# Patient Record
Sex: Female | Born: 1990 | Race: White | Hispanic: No | Smoking: Current every day smoker
Health system: Southern US, Community
[De-identification: ages and names within clinical notes are randomized; demographics above are authoritative.]

## PROBLEM LIST (undated history)

## (undated) ENCOUNTER — Inpatient Hospital Stay (HOSPITAL_COMMUNITY): Payer: Self-pay

## (undated) DIAGNOSIS — K311 Adult hypertrophic pyloric stenosis: Secondary | ICD-10-CM

## (undated) DIAGNOSIS — O24419 Gestational diabetes mellitus in pregnancy, unspecified control: Secondary | ICD-10-CM

## (undated) DIAGNOSIS — L509 Urticaria, unspecified: Secondary | ICD-10-CM

## (undated) DIAGNOSIS — E079 Disorder of thyroid, unspecified: Secondary | ICD-10-CM

## (undated) HISTORY — PX: PYLOROMYOTOMY: SHX5274

## (undated) HISTORY — DX: Gestational diabetes mellitus in pregnancy, unspecified control: O24.419

## (undated) HISTORY — DX: Disorder of thyroid, unspecified: E07.9

## (undated) HISTORY — DX: Urticaria, unspecified: L50.9

## (undated) NOTE — *Deleted (*Deleted)
Chronic urticaria Continue Zyrtec 10 mg taking 2 tablets in the morning and 2 tablets at night Continue Pepcid 20 mg twice a day Get labs completed that were ordered at last office visit.  Seasonal and perennial allergic rhinitis Continue antihistamines as above Consider saline rinses or saline nasal spray as needed for nasal symptoms.  Use this prior to any medicated nasal sprays. Consider immunotherapy in the future if medications are not helping.  Recurrent infections Get labs completed that were ordered from last office visit  Mild persistent asthma Continue Flovent 110 mcg using 1 puff twice a day with spacer to help prevent cough and wheeze May use albuterol 2 puffs every 4 hours as needed for cough, wheeze, tightness in chest, or shortness of breath.  Also may use albuterol 2 puffs 5 to 15 minutes prior to exercise to help prevent cough or wheeze For asthma flares increase Flovent to 2 puffs twice a day with spacer for 2 weeks.  Please let us know if this treatment is not working well for you Schedule a follow-up appointment in

---
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
## 2012-01-18 ENCOUNTER — Encounter (HOSPITAL_COMMUNITY): Payer: Self-pay | Admitting: Emergency Medicine

## 2012-01-18 ENCOUNTER — Emergency Department (INDEPENDENT_AMBULATORY_CARE_PROVIDER_SITE_OTHER)
Admission: EM | Admit: 2012-01-18 | Discharge: 2012-01-18 | Disposition: A | Discharge status: Home / Self Care | Payer: Medicaid Other | Source: Home / Self Care | Attending: Family Medicine | Admitting: Family Medicine

## 2012-01-18 DIAGNOSIS — N949 Unspecified condition associated with female genital organs and menstrual cycle: Secondary | ICD-10-CM

## 2012-01-18 HISTORY — DX: Adult hypertrophic pyloric stenosis: K31.1

## 2012-01-18 LAB — POCT URINALYSIS DIP (DEVICE)
Glucose, UA: NEGATIVE mg/dL
Nitrite: NEGATIVE
Protein, ur: NEGATIVE mg/dL
Urobilinogen, UA: 0.2 mg/dL (ref 0.0–1.0)

## 2012-01-18 LAB — POCT PREGNANCY, URINE: Preg Test, Ur: POSITIVE

## 2012-01-18 NOTE — ED Notes (Signed)
Reports one week ago, lower abdominal cramping.  Last night noticed dizziness.  Also c/o headache. Denies nausea, denies vomiting, denies diarrhea.  Denies vaginal discharge, denies burning with urination

## 2012-01-18 NOTE — ED Provider Notes (Signed)
History     CSN: [id number]  Arrival date & time 01/18/12  1303   First MD Initiated Contact with Patient 01/18/12 1530      Chief Complaint  Patient presents with  . Abdominal Cramping    (Consider location/radiation/quality/duration/timing/severity/associated sxs/prior treatment) HPI Comments: Peggy [name] presents for evaluation of one week of lower bilateral abdominal cramping. She is currently [redacted] weeks pregnant and denies any vaginal bleeding or discharge. This is her first pregnancy. She is followed by Dr. Clearance Coots [facility]. She had a normal ultrasound at 8.5 weeks. She denies any urinary symptoms.   Patient is a 21 y.o. female presenting with cramps. The history is provided by the patient.  Abdominal Cramping The primary symptoms of the illness include abdominal pain. The primary symptoms of the illness do not include vaginal discharge or vaginal bleeding. The current episode started more than 2 days ago. The onset of the illness was sudden. The problem has not changed since onset. The patient states that she believes she is currently pregnant. The patient has not had a change in bowel habit.    Past Medical History  Diagnosis Date  . Asthma   . Pyloric stenosis     Past Surgical History  Procedure Date  . Pyloromyotomy     No family history on file.  History  Substance Use Topics  . Smoking status: Never Smoker   . Smokeless tobacco: Not on file  . Alcohol Use: No    OB History    Grav Para Term Preterm Abortions TAB SAB Ect Mult Living   1               Review of Systems  Constitutional: Negative.   HENT: Negative.   Eyes: Negative.   Respiratory: Negative.   Cardiovascular: Negative.   Gastrointestinal: Positive for abdominal pain.  Genitourinary: Negative.  Negative for vaginal bleeding, vaginal discharge and vaginal pain.  Musculoskeletal: Negative.   Skin: Negative.   Neurological: Negative.     Allergies  Review of patient's allergies  indicates no known allergies.  Home Medications   Current Outpatient Rx  Name Route Sig Dispense Refill  . ALBUTEROL SULFATE HFA 108 (90 BASE) MCG/ACT IN AERS Inhalation Inhale 2 puffs into the lungs every 6 (six) hours as needed. Shortness of breath       BP 128/82  Pulse 96  Temp(Src) 98.8 F (37.1 C) (Oral)  Resp 18  SpO2 100%  LMP 09/18/2011  Physical Exam  Nursing note and vitals reviewed. Constitutional: She is oriented to person, place, and time. She appears well-developed and well-nourished.  HENT:  Head: Normocephalic and atraumatic.  Eyes: EOM are normal.  Neck: Normal range of motion.  Pulmonary/Chest: Effort normal.  Abdominal: Soft. Bowel sounds are normal. There is no tenderness.       Gravid abdomen; patient reports bilateral cramping RLQ and LLQ, not reproducible  Musculoskeletal: Normal range of motion.  Neurological: She is alert and oriented to person, place, and time.  Skin: Skin is warm and dry.  Psychiatric: Her behavior is normal.    ED Course  Procedures (including critical care time)  Labs Reviewed  POCT URINALYSIS DIP (DEVICE) - Abnormal; Notable for the following:    Leukocytes, UA SMALL (*) Biochemical Testing Only. Please order routine urinalysis from main lab if confirmatory testing is needed.   All other components within normal limits  POCT PREGNANCY, URINE  POCT URINALYSIS DIPSTICK  POCT PREGNANCY, URINE   No results found.  1. Round ligament pain       MDM  Advised acetaminophen and warm baths; FU with prenatal provider        Richardo Priest, MD 01/18/12 1617

## 2012-03-07 ENCOUNTER — Inpatient Hospital Stay (HOSPITAL_COMMUNITY)
Admission: AD | Admit: 2012-03-07 | Discharge: 2012-03-07 | Disposition: A | Discharge status: Home / Self Care | Payer: Medicaid Other | Source: Ambulatory Visit | Attending: Obstetrics & Gynecology | Admitting: Obstetrics & Gynecology

## 2012-03-07 ENCOUNTER — Encounter (HOSPITAL_COMMUNITY): Payer: Self-pay

## 2012-03-07 DIAGNOSIS — O368190 Decreased fetal movements, unspecified trimester, not applicable or unspecified: Secondary | ICD-10-CM | POA: Insufficient documentation

## 2012-03-07 NOTE — ED Provider Notes (Signed)
History     CSN: [id number]  Arrival date & time 03/07/12  1449   None     Chief Complaint  Patient presents with  . Decreased Fetal Movement   HPI Peggy [name] is a 21 y.o. female @ [redacted]w[redacted]d gestation who presents to [facility]U stating she has not felt fetal movement in 2 days. Normal recent anatomy scan except for spine not seen.   Past Medical History  Diagnosis Date  . Asthma   . Pyloric stenosis     Past Surgical History  Procedure Date  . Pyloromyotomy     No family history on file.  History  Substance Use Topics  . Smoking status: Never Smoker   . Smokeless tobacco: Not on file  . Alcohol Use: No    OB History    Grav Para Term Preterm Abortions TAB SAB Ect Mult Living   1               Review of Systems    Allergies  Review of patient's allergies indicates no known allergies.  Home Medications  No current outpatient prescriptions on file.  BP 112/73  Pulse 105  Temp(Src) 96.5 F (35.8 C) (Oral)  Resp 16  Ht 5' (1.524 m)  Wt 134 lb 3.2 oz (60.873 kg)  BMI 26.21 kg/m2  LMP 09/18/2011  Physical Exam  Nursing note and vitals reviewed. Constitutional: She is oriented to person, place, and time. She appears well-developed and well-nourished.  HENT:  Head: Normocephalic.  Eyes: EOM are normal.  Neck: Neck supple.  Pulmonary/Chest: Effort normal.  Abdominal: Soft. There is no tenderness.       + FHT  Musculoskeletal: Normal range of motion.  Neurological: She is alert and oriented to person, place, and time. No cranial nerve deficit.  Skin: Skin is warm and dry.  Psychiatric: She has a normal mood and affect. Her behavior is normal. Judgment and thought content normal.    ED Course: Care turned over to [place], CNM  Procedures  EFM: 150's, minimal variability, no accels or decels. Reassuring for gestation. FM heard on NST No UC's palpated  MDM  Decreased FM at 22.[redacted] weeks gestation w/ reassuring FHR tracing  D/C home F/U in Dr.  Verdell Carmine office as scheduled or MAU PRN Normal expectations for FM discussed  Dorathy Kinsman 03/07/2012 4:48 PM  Janne Napoleon, NP 03/07/12 1634

## 2012-03-07 NOTE — Progress Notes (Signed)
Baby has not moved for 2 days.

## 2012-03-09 ENCOUNTER — Other Ambulatory Visit: Payer: Self-pay | Admitting: Obstetrics

## 2012-03-09 DIAGNOSIS — Z0489 Encounter for examination and observation for other specified reasons: Secondary | ICD-10-CM

## 2012-03-09 DIAGNOSIS — IMO0002 Reserved for concepts with insufficient information to code with codable children: Secondary | ICD-10-CM

## 2012-03-11 ENCOUNTER — Other Ambulatory Visit: Payer: Self-pay | Admitting: Obstetrics

## 2012-03-11 ENCOUNTER — Ambulatory Visit (HOSPITAL_COMMUNITY)
Admission: RE | Admit: 2012-03-11 | Discharge: 2012-03-11 | Disposition: A | Discharge status: Home / Self Care | Payer: Medicaid Other | Source: Ambulatory Visit | Attending: Obstetrics | Admitting: Obstetrics

## 2012-03-11 DIAGNOSIS — O99330 Smoking (tobacco) complicating pregnancy, unspecified trimester: Secondary | ICD-10-CM | POA: Insufficient documentation

## 2012-03-11 DIAGNOSIS — Z1389 Encounter for screening for other disorder: Secondary | ICD-10-CM | POA: Insufficient documentation

## 2012-03-11 DIAGNOSIS — Z0489 Encounter for examination and observation for other specified reasons: Secondary | ICD-10-CM

## 2012-03-11 DIAGNOSIS — O358XX0 Maternal care for other (suspected) fetal abnormality and damage, not applicable or unspecified: Secondary | ICD-10-CM | POA: Insufficient documentation

## 2012-03-11 DIAGNOSIS — Z363 Encounter for antenatal screening for malformations: Secondary | ICD-10-CM | POA: Insufficient documentation

## 2012-03-11 DIAGNOSIS — IMO0002 Reserved for concepts with insufficient information to code with codable children: Secondary | ICD-10-CM

## 2012-04-08 ENCOUNTER — Encounter (HOSPITAL_COMMUNITY): Payer: Self-pay | Admitting: *Deleted

## 2012-04-08 ENCOUNTER — Inpatient Hospital Stay (HOSPITAL_COMMUNITY)
Admission: AD | Admit: 2012-04-08 | Discharge: 2012-04-08 | Disposition: A | Discharge status: Home / Self Care | Payer: Medicaid Other | Source: Ambulatory Visit | Attending: Obstetrics & Gynecology | Admitting: Obstetrics & Gynecology

## 2012-04-08 DIAGNOSIS — O368190 Decreased fetal movements, unspecified trimester, not applicable or unspecified: Secondary | ICD-10-CM

## 2012-04-08 DIAGNOSIS — R109 Unspecified abdominal pain: Secondary | ICD-10-CM | POA: Insufficient documentation

## 2012-04-08 LAB — URINALYSIS, ROUTINE W REFLEX MICROSCOPIC
Bilirubin Urine: NEGATIVE
Glucose, UA: NEGATIVE mg/dL
Ketones, ur: NEGATIVE mg/dL
Leukocytes, UA: NEGATIVE
Nitrite: NEGATIVE
Protein, ur: NEGATIVE mg/dL
pH: 6 (ref 5.0–8.0)

## 2012-04-08 MED ORDER — POLYETHYLENE GLYCOL 3350 17 GM/SCOOP PO POWD: 17.0000 g | Freq: Every day | ORAL | Status: AC

## 2012-04-08 NOTE — MAU Provider Note (Signed)
  History     CSN: [id number]  Arrival date and time: 04/08/12 4098   First Provider Initiated Contact with Patient 04/08/12 2133      Chief Complaint  Patient presents with  . Abdominal Pain  . Decreased Fetal Movement   HPI Per RN:   Pt presents with complaint that she has not felt any fetal movement since yesterday pm.(FHT's 150 right now). Reports pain left upper quadrant off/on for 2 weeks. Denies dysuria, denies bleeding or ROM. G1      Decreased FM and intermittent LUQ pain, last this morning. Long history of constipation.Cannot remember when she went last OB History    Grav Para Term Preterm Abortions TAB SAB Ect Mult Living   1               Past Medical History  Diagnosis Date  . Asthma   . Pyloric stenosis   . No pertinent past medical history     Past Surgical History  Procedure Date  . Pyloromyotomy   . No past surgeries     Family History  Problem Relation Age of Onset  . Diabetes Mother   . Hyperlipidemia Father     History  Substance Use Topics  . Smoking status: Never Smoker   . Smokeless tobacco: Not on file  . Alcohol Use: No    Allergies: No Known Allergies  Prescriptions prior to admission  Medication Sig Dispense Refill  . Prenatal Vit-Fe Fumarate-FA (PRENATAL MULTIVITAMIN) TABS Take 1 tablet by mouth daily.      Marland Kitchen albuterol (PROVENTIL HFA;VENTOLIN HFA) 108 (90 BASE) MCG/ACT inhaler Inhale 2 puffs into the lungs every 6 (six) hours as needed. Shortness of breath         Review of Systems  Constitutional: Negative for fever and chills.  Gastrointestinal: Positive for abdominal pain (intermittent, LUQ) and constipation. Negative for nausea, vomiting and diarrhea.  Genitourinary: Negative for dysuria.    Physical Exam   Blood pressure 139/74, pulse 103, temperature 100 F (37.8 C), temperature source Oral, resp. rate 16, height 5' (1.524 m), weight 140 lb (63.504 kg), last menstrual period 09/18/2011, SpO2 99.00%.  Physical  Exam  Constitutional: She is oriented to person, place, and time. She appears well-developed and well-nourished. No distress.  HENT:  Head: Normocephalic.  Cardiovascular: Normal rate.   Respiratory: Effort normal.  GI: Soft. She exhibits no distension. There is no tenderness. There is no rebound and no guarding.  Genitourinary: Vagina normal and uterus normal. No vaginal discharge found.  Musculoskeletal: Normal range of motion.  Neurological: She is alert and oriented to person, place, and time.  Skin: Skin is warm and dry.  Psychiatric: She has a normal mood and affect.   EFM:  FHR reassuring           No contractions  Dilation: Closed Effacement (%): Thick Cervical Position: Posterior Station: Ballotable Exam by:: Artelia Laroche CNM  MAU Course  Procedures   Assessment and Plan  A:  SIUP at [redacted]w[redacted]d      Audible fetal movement, but pt cannot feel it      Reassuring FHR P:  Discharge home      Rx Miralax and recommend fiber and water      Followup in office   [facility] 04/08/2012, 9:41 PM

## 2012-04-08 NOTE — Discharge Instructions (Signed)
Fetal Movement Counts Patient Name: __________________________________________________ Patient Due Date: ____________________ Kick counts is highly recommended in high risk pregnancies, but it is a good idea for every pregnant woman to do. Start counting fetal movements at 28 weeks of the pregnancy. Fetal movements increase after eating a full meal or eating or drinking something sweet (the blood sugar is higher). It is also important to drink plenty of fluids (well hydrated) before doing the count. Lie on your left side because it helps with the circulation or you can sit in a comfortable chair with your arms over your belly (abdomen) with no distractions around you. DOING THE COUNT  Try to do the count the same time of day each time you do it.   Mark the day and time, then see how long it takes for you to feel 10 movements (kicks, flutters, swishes, rolls). You should have at least 10 movements within 2 hours. You will most likely feel 10 movements in much less than 2 hours. If you do not, wait an hour and count again. After a couple of days you will see a pattern.   What you are looking for is a change in the pattern or not enough counts in 2 hours. Is it taking longer in time to reach 10 movements?  SEEK MEDICAL CARE IF:  You feel less than 10 counts in 2 hours. Tried twice.   No movement in one hour.   The pattern is changing or taking longer each day to reach 10 counts in 2 hours.   You feel the baby is not moving as it usually does.  Date: ____________ Movements: ____________ Start time: ____________ Finish time: ____________  Date: ____________ Movements: ____________ Start time: ____________ Finish time: ____________ Date: ____________ Movements: ____________ Start time: ____________ Finish time: ____________ Date: ____________ Movements: ____________ Start time: ____________ Finish time: ____________ Date: ____________ Movements: ____________ Start time: ____________ Finish time:  ____________ Date: ____________ Movements: ____________ Start time: ____________ Finish time: ____________ Date: ____________ Movements: ____________ Start time: ____________ Finish time: ____________ Date: ____________ Movements: ____________ Start time: ____________ Finish time: ____________  Date: ____________ Movements: ____________ Start time: ____________ Finish time: ____________ Date: ____________ Movements: ____________ Start time: ____________ Finish time: ____________ Date: ____________ Movements: ____________ Start time: ____________ Finish time: ____________ Date: ____________ Movements: ____________ Start time: ____________ Finish time: ____________ Date: ____________ Movements: ____________ Start time: ____________ Finish time: ____________ Date: ____________ Movements: ____________ Start time: ____________ Finish time: ____________ Date: ____________ Movements: ____________ Start time: ____________ Finish time: ____________  Date: ____________ Movements: ____________ Start time: ____________ Finish time: ____________ Date: ____________ Movements: ____________ Start time: ____________ Finish time: ____________ Date: ____________ Movements: ____________ Start time: ____________ Finish time: ____________ Date: ____________ Movements: ____________ Start time: ____________ Finish time: ____________ Date: ____________ Movements: ____________ Start time: ____________ Finish time: ____________ Date: ____________ Movements: ____________ Start time: ____________ Finish time: ____________ Date: ____________ Movements: ____________ Start time: ____________ Finish time: ____________  Date: ____________ Movements: ____________ Start time: ____________ Finish time: ____________ Date: ____________ Movements: ____________ Start time: ____________ Finish time: ____________ Date: ____________ Movements: ____________ Start time: ____________ Finish time: ____________ Date: ____________ Movements:  ____________ Start time: ____________ Finish time: ____________ Date: ____________ Movements: ____________ Start time: ____________ Finish time: ____________ Date: ____________ Movements: ____________ Start time: ____________ Finish time: ____________ Date: ____________ Movements: ____________ Start time: ____________ Finish time: ____________  Date: ____________ Movements: ____________ Start time: ____________ Finish time: ____________ Date: ____________ Movements: ____________ Start time: ____________ Finish time: ____________ Date: ____________ Movements: ____________ Start time:   ____________ Doreatha Martin time: ____________ Date: ____________ Movements: ____________ Start time: ____________ Doreatha Martin time: ____________ Date: ____________ Movements: ____________ Start time: ____________ Doreatha Martin time: ____________ Date: ____________ Movements: ____________ Start time: ____________ Doreatha Martin time: ____________ Date: ____________ Movements: ____________ Start time: ____________ Doreatha Martin time: ____________  Date: ____________ Movements: ____________ Start time: ____________ Doreatha Martin time: ____________ Date: ____________ Movements: ____________ Start time: ____________ Doreatha Martin time: ____________ Date: ____________ Movements: ____________ Start time: ____________ Doreatha Martin time: ____________ Date: ____________ Movements: ____________ Start time: ____________ Doreatha Martin time: ____________ Date: ____________ Movements: ____________ Start time: ____________ Doreatha Martin time: ____________ Date: ____________ Movements: ____________ Start time: ____________ Doreatha Martin time: ____________ Date: ____________ Movements: ____________ Start time: ____________ Doreatha Martin time: ____________  Date: ____________ Movements: ____________ Start time: ____________ Doreatha Martin time: ____________ Date: ____________ Movements: ____________ Start time: ____________ Doreatha Martin time: ____________ Date: ____________ Movements: ____________ Start time: ____________ Doreatha Martin  time: ____________ Date: ____________ Movements: ____________ Start time: ____________ Doreatha Martin time: ____________ Date: ____________ Movements: ____________ Start time: ____________ Doreatha Martin time: ____________ Date: ____________ Movements: ____________ Start time: ____________ Doreatha Martin time: ____________ Date: ____________ Movements: ____________ Start time: ____________ Doreatha Martin time: ____________  Date: ____________ Movements: ____________ Start time: ____________ Doreatha Martin time: ____________ Date: ____________ Movements: ____________ Start time: ____________ Doreatha Martin time: ____________ Date: ____________ Movements: ____________ Start time: ____________ Doreatha Martin time: ____________ Date: ____________ Movements: ____________ Start time: ____________ Doreatha Martin time: ____________ Date: ____________ Movements: ____________ Start time: ____________ Doreatha Martin time: ____________ Date: ____________ Movements: ____________ Start time: ____________ Doreatha Martin time: ____________ Document Released: 01/15/2007 Document Revised: 12/05/2011 Document Reviewed: 07/18/2009 [employer] Patient Information 2012 [address].  Constipation in Adults Constipation is having fewer than 2 bowel movements per week. Usually, the stools are hard. As we grow older, constipation is more common. If you try to fix constipation with laxatives, the problem may get worse. This is because laxatives taken over a long period of time make the colon muscles weaker. A low-fiber diet, not taking in enough fluids, and taking some medicines may make these problems worse. MEDICATIONS THAT MAY CAUSE CONSTIPATION  Water pills (diuretics).   Calcium channel blockers (used to control blood pressure and for the heart).   Certain pain medicines (narcotics).   Anticholinergics.   Anti-inflammatory agents.   Antacids that contain aluminum.  DISEASES THAT CONTRIBUTE TO CONSTIPATION  Diabetes.   Parkinson's disease.   Dementia.   Stroke.   Depression.     Illnesses that cause problems with salt and water metabolism.  HOME CARE INSTRUCTIONS   Constipation is usually best cared for without medicines. Increasing dietary fiber and eating more fruits and vegetables is the best way to manage constipation.   Slowly increase fiber intake to 25 to 38 grams per day. Whole grains, fruits, vegetables, and legumes are good sources of fiber. A dietitian can further help you incorporate high-fiber foods into your diet.   Drink enough water and fluids to keep your urine clear or pale yellow.   A fiber supplement may be added to your diet if you cannot get enough fiber from foods.   Increasing your activities also helps improve regularity.   Suppositories, as suggested by your caregiver, will also help. If you are using antacids, such as aluminum or calcium containing products, it will be helpful to switch to products containing magnesium if your caregiver says it is okay.   If you have been given a liquid injection (enema) today, this is only a temporary measure. It should not be relied on for treatment of longstanding (chronic) constipation.   Stronger measures, such as magnesium sulfate, should  be avoided if possible. This may cause uncontrollable diarrhea. Using magnesium sulfate may not allow you time to make it to the bathroom.  SEEK IMMEDIATE MEDICAL CARE IF:   There is bright red blood in the stool.   The constipation stays for more than 4 days.   There is belly (abdominal) or rectal pain.   You do not seem to be getting better.   You have any questions or concerns.  MAKE SURE YOU:   Understand these instructions.   Will watch your condition.   Will get help right away if you are not doing well or get worse.  Document Released: 09/13/2004 Document Revised: 12/05/2011 Document Reviewed: 11/19/2011 [facility] Patient Information 2012 [place], [place].

## 2012-04-08 NOTE — MAU Note (Signed)
Pt presents with complaint that she has not felt any fetal movement since yesterday pm.(FHT's 150 right now). Reports pain left upper quadrant off/on for 2 weeks. Denies dysuria, denies bleeding or ROM. G1

## 2012-05-26 ENCOUNTER — Inpatient Hospital Stay (HOSPITAL_COMMUNITY)
Admission: AD | Admit: 2012-05-26 | Discharge: 2012-05-26 | Disposition: A | Discharge status: Home / Self Care | Payer: Medicaid Other | Source: Ambulatory Visit | Attending: Obstetrics | Admitting: Obstetrics

## 2012-05-26 ENCOUNTER — Encounter (HOSPITAL_COMMUNITY): Payer: Self-pay | Admitting: *Deleted

## 2012-05-26 DIAGNOSIS — B9689 Other specified bacterial agents as the cause of diseases classified elsewhere: Secondary | ICD-10-CM

## 2012-05-26 DIAGNOSIS — R109 Unspecified abdominal pain: Secondary | ICD-10-CM | POA: Insufficient documentation

## 2012-05-26 DIAGNOSIS — O4700 False labor before 37 completed weeks of gestation, unspecified trimester: Secondary | ICD-10-CM

## 2012-05-26 DIAGNOSIS — O479 False labor, unspecified: Secondary | ICD-10-CM

## 2012-05-26 DIAGNOSIS — A499 Bacterial infection, unspecified: Secondary | ICD-10-CM | POA: Insufficient documentation

## 2012-05-26 DIAGNOSIS — N760 Acute vaginitis: Secondary | ICD-10-CM | POA: Insufficient documentation

## 2012-05-26 DIAGNOSIS — O2390 Unspecified genitourinary tract infection in pregnancy, unspecified trimester: Secondary | ICD-10-CM | POA: Insufficient documentation

## 2012-05-26 LAB — URINALYSIS, ROUTINE W REFLEX MICROSCOPIC
Bilirubin Urine: NEGATIVE
Ketones, ur: NEGATIVE mg/dL
Nitrite: NEGATIVE
Specific Gravity, Urine: 1.01 (ref 1.005–1.030)
pH: 7.5 (ref 5.0–8.0)

## 2012-05-26 LAB — URINE MICROSCOPIC-ADD ON

## 2012-05-26 LAB — WET PREP, GENITAL: Yeast Wet Prep HPF POC: NONE SEEN

## 2012-05-26 MED ORDER — METRONIDAZOLE 500 MG PO TABS: 500.0000 mg | ORAL_TABLET | Freq: Two times a day (BID) | ORAL | Status: DC

## 2012-05-26 NOTE — Discharge Instructions (Signed)
Bacterial Vaginosis Bacterial vaginosis is an infection of the vagina. A healthy vagina has many kinds of good germs (bacteria). Sometimes the number of good germs can change. This allows bad germs to move in and cause an infection. You may be given medicine (antibiotics) to treat the infection. Or, you may not need treatment at all. HOME CARE  Take your medicine as told. Finish them even if you start to feel better.   Do not have sex until you finish your medicine.   Do not douche.   Practice safe sex.   Tell your sex partner that you have an infection. They should see their doctor for treatment if they have problems.  GET HELP RIGHT AWAY IF:  You do not get better after 3 days of treatment.   You have grey fluid (discharge) coming from your vagina.   You have pain.   You have a temperature of 102 F (38.9 C) or higher.  MAKE SURE YOU:   Understand these instructions.   Will watch your condition.   Will get help right away if you are not doing well or get worse.  Document Released: 09/24/2008 Document Revised: 12/05/2011 Document Reviewed: 09/24/2008 [employer] Patient Information 2012 [employer]. 

## 2012-05-26 NOTE — MAU Note (Signed)
PT SAYS SHE HAS BEEN HAVING CRAMPS X1 WEEK.  WENT TO DR HARPER LAST Monday- TOLD HIM OF CRAMPING.  Monday NIGHT- CRAMPING BECAME WORSE LASTED 2 HRS THEN STOPPED. .  NOW IN TRIAGE  - LESS CRAMPING.   WHEN  SHE WIPES - SHE HAD PINK D/C ON  PAPER.- THAT HAPPENED  AT 7 PM..   LAST SEX-  3 WEEKS AGO.

## 2012-05-26 NOTE — MAU Provider Note (Signed)
Chief Complaint:  No chief complaint on file.   First Provider Initiated Contact with Patient 05/26/12 2109      HPI  Peggy [name] is a 21 y.o. G1P0 at [redacted]w[redacted]d presenting with one week history of tightening and cramping felt in the lower abdomen. Today this occurred about 10 times and she is not experiencing abdominal pain now. She had some pinkish fluid on toilet paper after urinating a few hours ago but this has not recurred. Denies leakage of fluid.  Good fetal movement. Denies irritating vaginal discharge  Pregnancy Course: uncomplicated  Past Medical History: Past Medical History  Diagnosis Date  . Asthma   . Pyloric stenosis     Past Surgical History: Past Surgical History  Procedure Date  . Pyloromyotomy   . No past surgeries     Family History: Family History  Problem Relation Age of Onset  . Diabetes Mother   . Hyperlipidemia Father     Social History: History  Substance Use Topics  . Smoking status: Never Smoker   . Smokeless tobacco: Not on file  . Alcohol Use: No    Allergies: No Known Allergies  Meds:  Prescriptions prior to admission  Medication Sig Dispense Refill  . albuterol (PROVENTIL HFA;VENTOLIN HFA) 108 (90 BASE) MCG/ACT inhaler Inhale 2 puffs into the lungs every 6 (six) hours as needed. For shortness of breath      . Prenatal Vit-Fe Fumarate-FA (PRENATAL MULTIVITAMIN) TABS Take 1 tablet by mouth daily.          Physical Exam  Blood pressure 114/64, pulse 98, temperature 98.9 F (37.2 C), temperature source Oral, resp. rate 20, height 5' (1.524 m), weight 66.395 kg (146 lb 6 oz), last menstrual period 09/18/2011. GENERAL: Well-developed, well-nourished female in no acute distress.  HEENT: normocephalic, good dentition HEART: normal rate RESP: normal effort ABDOMEN: Soft, nontender, no UC palpable  PELVIC: NEFG, vagina with scant white discharge, no blood seen EXTREMITIES: Nontender, no edema NEURO: alert and  oriented  SPECULUM EXAM: Dilation: Closed Effacement (%): Thick Station: -3 Exam by:: D. Janneth Krasner CNM  FHT:  Baseline 150 , moderate variability, accelerations present, no decelerations Contractions: slight UI, not contracting   Labs: Results for orders placed during the hospital encounter of 05/26/12 (from the past 24 hour(s))  URINALYSIS, ROUTINE W REFLEX MICROSCOPIC     Status: Abnormal   Collection Time   05/26/12  8:03 PM      Component Value Range   Color, Urine YELLOW  YELLOW    APPearance CLEAR  CLEAR    Specific Gravity, Urine 1.010  1.005 - 1.030    pH 7.5  5.0 - 8.0    Glucose, UA NEGATIVE  NEGATIVE (mg/dL)   Hgb urine dipstick SMALL (*) NEGATIVE    Bilirubin Urine NEGATIVE  NEGATIVE    Ketones, ur NEGATIVE  NEGATIVE (mg/dL)   Protein, ur NEGATIVE  NEGATIVE (mg/dL)   Urobilinogen, UA 0.2  0.0 - 1.0 (mg/dL)   Nitrite NEGATIVE  NEGATIVE    Leukocytes, UA SMALL (*) NEGATIVE   URINE MICROSCOPIC-ADD ON     Status: Normal   Collection Time   05/26/12  8:03 PM      Component Value Range   Squamous Epithelial / LPF RARE  RARE    WBC, UA 0-2  <3 (WBC/hpf)   RBC / HPF 0-2  <3 (RBC/hpf)  WET PREP, GENITAL     Status: Abnormal   Collection Time   05/26/12  9:20 PM  Component Value Range   Yeast Wet Prep HPF POC NONE SEEN  NONE SEEN    Trich, Wet Prep NONE SEEN  NONE SEEN    Clue Cells Wet Prep HPF POC FEW (*) NONE SEEN    WBC, Wet Prep HPF POC FEW (*) NONE SEEN         Assessment: Fetus well with no evidence of vaginal bleeding or PTL BV    Plan: D/W Dr. Gaynell Face. Advise pelvic rest until appt with Dr. Clearance Coots next week. S/sx PTL reviewed.  Rx Flagyl   [name] 5/28/20139:19 PM

## 2012-05-27 LAB — GC/CHLAMYDIA PROBE AMP, GENITAL: Chlamydia, DNA Probe: NEGATIVE

## 2012-05-31 ENCOUNTER — Encounter (HOSPITAL_COMMUNITY): Payer: Self-pay | Admitting: *Deleted

## 2012-05-31 ENCOUNTER — Inpatient Hospital Stay (HOSPITAL_COMMUNITY)
Admission: AD | Admit: 2012-05-31 | Discharge: 2012-06-01 | Disposition: A | Discharge status: Home / Self Care | Payer: Medicaid Other | Source: Ambulatory Visit | Attending: Obstetrics | Admitting: Obstetrics

## 2012-05-31 DIAGNOSIS — O479 False labor, unspecified: Secondary | ICD-10-CM

## 2012-05-31 DIAGNOSIS — O4700 False labor before 37 completed weeks of gestation, unspecified trimester: Secondary | ICD-10-CM | POA: Insufficient documentation

## 2012-05-31 DIAGNOSIS — O99891 Other specified diseases and conditions complicating pregnancy: Secondary | ICD-10-CM | POA: Insufficient documentation

## 2012-05-31 DIAGNOSIS — R079 Chest pain, unspecified: Secondary | ICD-10-CM | POA: Insufficient documentation

## 2012-05-31 DIAGNOSIS — K219 Gastro-esophageal reflux disease without esophagitis: Secondary | ICD-10-CM

## 2012-05-31 DIAGNOSIS — L559 Sunburn, unspecified: Secondary | ICD-10-CM | POA: Insufficient documentation

## 2012-05-31 DIAGNOSIS — O99019 Anemia complicating pregnancy, unspecified trimester: Secondary | ICD-10-CM | POA: Insufficient documentation

## 2012-05-31 DIAGNOSIS — D649 Anemia, unspecified: Secondary | ICD-10-CM

## 2012-05-31 LAB — CBC
MCH: 28.6 pg (ref 26.0–34.0)
Platelets: 290 10*3/uL (ref 150–400)
RBC: 3.43 MIL/uL — ABNORMAL LOW (ref 3.87–5.11)

## 2012-05-31 LAB — COMPREHENSIVE METABOLIC PANEL
AST: 14 U/L (ref 0–37)
BUN: 6 mg/dL (ref 6–23)
CO2: 20 mEq/L (ref 19–32)
Calcium: 8.7 mg/dL (ref 8.4–10.5)
Chloride: 101 mEq/L (ref 96–112)
Creatinine, Ser: 0.43 mg/dL — ABNORMAL LOW (ref 0.50–1.10)
GFR calc Af Amer: >90 mL/min (ref >90)
GFR calc non Af Amer: >90 mL/min (ref >90)
Glucose, Bld: 99 mg/dL (ref 70–99)
Total Bilirubin: 0.2 mg/dL — ABNORMAL LOW (ref 0.3–1.2)

## 2012-05-31 LAB — URINE MICROSCOPIC-ADD ON

## 2012-05-31 LAB — URINALYSIS, ROUTINE W REFLEX MICROSCOPIC
Bilirubin Urine: NEGATIVE
Glucose, UA: NEGATIVE mg/dL
Hgb urine dipstick: NEGATIVE
Ketones, ur: NEGATIVE mg/dL
Protein, ur: NEGATIVE mg/dL
pH: 6.5 (ref 5.0–8.0)

## 2012-05-31 MED ORDER — NIFEDIPINE 10 MG PO CAPS
10.0000 mg | ORAL_CAPSULE | Freq: Once | ORAL | Status: AC
  Administered 2012-05-31: 10 mg via ORAL
  Filled 2012-05-31: qty 1

## 2012-05-31 MED ORDER — GI COCKTAIL ~~LOC~~
30.0000 mL | Freq: Once | ORAL | Status: AC
  Administered 2012-05-31: 30 mL via ORAL
  Filled 2012-05-31: qty 30

## 2012-05-31 MED ORDER — LACTATED RINGERS IV BOLUS (SEPSIS)
1000.0000 mL | Freq: Once | INTRAVENOUS | Status: AC
  Administered 2012-05-31: 1000 mL via INTRAVENOUS

## 2012-05-31 NOTE — Progress Notes (Signed)
Pt states pain is dull"for the most part"but does become sharp

## 2012-05-31 NOTE — MAU Note (Signed)
Pt reports "really , really bad chest pain since 6:30 and it hasn't went away." pain is mid sternum. Last meal was at 1700, pizza. Pain does not radiate.

## 2012-05-31 NOTE — MAU Note (Signed)
Pt states she started feeling pain in her chest around 1830 tonight, Pt states pain comes and goes

## 2012-06-01 MED ORDER — RANITIDINE HCL 150 MG PO CAPS: 150.0000 mg | ORAL_CAPSULE | Freq: Every day | ORAL | Status: DC

## 2012-06-01 MED ORDER — FERROUS SULFATE 325 (65 FE) MG PO TBEC: 325.0000 mg | DELAYED_RELEASE_TABLET | Freq: Two times a day (BID) | ORAL | Status: DC

## 2012-06-01 NOTE — Discharge Instructions (Signed)
Drink plenty of fluids, rest and follow up with Dr. Clearance Coots. Return here as needed.  Anemia, Nonspecific Your exam and blood tests show you are anemic. This means your blood (hemoglobin) level is low. Normal hemoglobin values are 12 to 15 g/dL for females and 14 to 17 g/dL for males. Make a note of your hemoglobin level today. The hematocrit percent is also used to measure anemia. A normal hematocrit is 38% to 46% in females and 42% to 49% in males. Make a note of your hematocrit level today. CAUSES  Anemia can be due to many different causes.  Excessive bleeding from periods (in women).   Intestinal bleeding.   Poor nutrition.   Kidney, thyroid, liver, and bone marrow diseases.  SYMPTOMS  Anemia can come on suddenly (acute). It can also come on slowly. Symptoms can include:  Minor weakness.   Dizziness.   Palpitations.   Shortness of breath.  Symptoms may be absent until half your hemoglobin is missing if it comes on slowly. Anemia due to acute blood loss from an injury or internal bleeding may require blood transfusion if the loss is severe. Hospital care is needed if you are anemic and there is significant continual blood loss. TREATMENT   Stool tests for blood (Hemoccult) and additional lab tests are often needed. This determines the best treatment.   Further checking on your condition and your response to treatment is very important. It often takes many weeks to correct anemia.  Depending on the cause, treatment can include:  Supplements of iron.   Vitamins B12 and folic acid.   Hormone medicines.If your anemia is due to bleeding, finding the cause of the blood loss is very important. This will help avoid further problems.  SEEK IMMEDIATE MEDICAL CARE IF:   You develop fainting, extreme weakness, shortness of breath, or chest pain.   You develop heavy vaginal bleeding.   You develop bloody or black, tarry stools or vomit up blood.   You develop a high fever, rash,  repeated vomiting, or dehydration.  Document Released: 01/23/2005 Document Revised: 12/05/2011 Document Reviewed: 10/31/2009 [facility] Patient Information 2012 [place], [place].

## 2012-06-01 NOTE — MAU Provider Note (Signed)
History     CSN: [id number]  Arrival date & time 05/31/12  2053   None     Chief Complaint  Patient presents with  . Chest Pain    HPI Peggy [name] is a 21 y.o. female @ [redacted]w[redacted]d gestation who presents to MAU for chest pain that started today after eating pizza. She denies any other problems. She has been at the pool all day and feeling well.   Past Medical History  Diagnosis Date  . Asthma   . Pyloric stenosis     Past Surgical History  Procedure Date  . Pyloromyotomy   . No past surgeries     Family History  Problem Relation Age of Onset  . Diabetes Mother   . Hyperlipidemia Father     History  Substance Use Topics  . Smoking status: Current Everyday Smoker -- 0.2 packs/day  . Smokeless tobacco: Not on file  . Alcohol Use: No    OB History    Grav Para Term Preterm Abortions TAB SAB Ect Mult Living   1               Review of Systems  Constitutional: Negative for fever, chills, diaphoresis and fatigue.  HENT: Negative for ear pain, congestion, sore throat, facial swelling, neck pain, neck stiffness, dental problem and sinus pressure.   Eyes: Negative for photophobia, pain and discharge.  Respiratory: Negative for cough, chest tightness and wheezing.   Cardiovascular: Positive for chest pain.  Gastrointestinal: Negative for nausea, vomiting, abdominal pain, diarrhea, constipation and abdominal distention.  Genitourinary: Negative for dysuria, frequency, flank pain and difficulty urinating.  Musculoskeletal: Negative for myalgias, back pain and gait problem.  Skin: Negative for color change and rash.  Neurological: Negative for dizziness, speech difficulty, weakness, light-headedness, numbness and headaches.  Psychiatric/Behavioral: Negative for confusion and agitation.    Allergies  Review of patient's allergies indicates no known allergies.  Home Medications  No current outpatient prescriptions on file.  BP 137/73  Pulse 95  Temp(Src) 98.8 F  (37.1 C) (Oral)  Resp 18  Ht 5' (1.524 m)  Wt 147 lb (66.679 kg)  BMI 28.71 kg/m2  SpO2 100%  LMP 09/18/2011  Physical Exam  Nursing note and vitals reviewed. Constitutional: She is oriented to person, place, and time. She appears well-developed and well-nourished.  HENT:  Head: Normocephalic.  Eyes: EOM are normal.  Neck: Neck supple.  Cardiovascular: Normal rate.   Pulmonary/Chest: Effort normal.  Abdominal: Soft. There is no tenderness.  Genitourinary:       Cervix closed, thick, high.  Musculoskeletal: Normal range of motion.  Neurological: She is alert and oriented to person, place, and time. No cranial nerve deficit.  Skin:       sunburn  Psychiatric: She has a normal mood and affect. Her behavior is normal. Judgment and thought content normal.   EFM: Base line FH 145, contracting every 3 minutes, reactive  Results for orders placed during the hospital encounter of 05/31/12 (from the past 24 hour(s))  URINALYSIS, ROUTINE W REFLEX MICROSCOPIC     Status: Abnormal   Collection Time   05/31/12  9:10 PM      Component Value Range   Color, Urine YELLOW  YELLOW    APPearance CLEAR  CLEAR    Specific Gravity, Urine <1.005 (*) 1.005 - 1.030    pH 6.5  5.0 - 8.0    Glucose, UA NEGATIVE  NEGATIVE (mg/dL)   Hgb urine dipstick NEGATIVE  NEGATIVE  Bilirubin Urine NEGATIVE  NEGATIVE    Ketones, ur NEGATIVE  NEGATIVE (mg/dL)   Protein, ur NEGATIVE  NEGATIVE (mg/dL)   Urobilinogen, UA 0.2  0.0 - 1.0 (mg/dL)   Nitrite NEGATIVE  NEGATIVE    Leukocytes, UA SMALL (*) NEGATIVE   URINE MICROSCOPIC-ADD ON     Status: Abnormal   Collection Time   05/31/12  9:10 PM      Component Value Range   Squamous Epithelial / LPF FEW (*) RARE    WBC, UA 3-6  <3 (WBC/hpf)   RBC / HPF 0-2  <3 (RBC/hpf)   Bacteria, UA FEW (*) RARE   COMPREHENSIVE METABOLIC PANEL     Status: Abnormal   Collection Time   05/31/12 10:58 PM      Component Value Range   Sodium 135  135 - 145 (mEq/L)   Potassium 3.4  (*) 3.5 - 5.1 (mEq/L)   Chloride 101  96 - 112 (mEq/L)   CO2 20  19 - 32 (mEq/L)   Glucose, Bld 99  70 - 99 (mg/dL)   BUN 6  6 - 23 (mg/dL)   Creatinine, Ser 1.30 (*) 0.50 - 1.10 (mg/dL)   Calcium 8.7  8.4 - 86.5 (mg/dL)   Total Protein 6.6  6.0 - 8.3 (g/dL)   Albumin 2.8 (*) 3.5 - 5.2 (g/dL)   AST 14  0 - 37 (U/L)   ALT 8  0 - 35 (U/L)   Alkaline Phosphatase 135 (*) 39 - 117 (U/L)   Total Bilirubin 0.2 (*) 0.3 - 1.2 (mg/dL)   GFR calc non Af Amer >90  >90 (mL/min)   GFR calc Af Amer >90  >90 (mL/min)  CBC     Status: Abnormal   Collection Time   05/31/12 10:58 PM      Component Value Range   WBC 12.2 (*) 4.0 - 10.5 (K/uL)   RBC 3.43 (*) 3.87 - 5.11 (MIL/uL)   Hemoglobin 9.8 (*) 12.0 - 15.0 (g/dL)   HCT 78.4 (*) 69.6 - 46.0 (%)   MCV 86.3  78.0 - 100.0 (fL)   MCH 28.6  26.0 - 34.0 (pg)   MCHC 33.1  30.0 - 36.0 (g/dL)   RDW 29.5  28.4 - 13.2 (%)   Platelets 290  150 - 400 (K/uL)   I spoke with Dr. Clearance Coots and we will give patient IV fluids and procardia and observe  GI Cocktail 30 ccs. Po  After GI cocktail patient no longer has heart burn. After IV fluids and procardia contractions are less but continue to have irregular contractions. Patient not feeling contractions. Dr. Clearance Coots notified and request that the patient be discharged home and follow up in the office.    Assessment: Preterm contractions   GERD in pregnancy   Sunburn   Anemia  Plan:  Zantac Rx   Fe Rx   Discussed in detail with patient lab and clinical findings and need for follow up. Patient voices understanding.     ED Course: Discussed with Dr. Clearance Coots  Procedures  MDM

## 2012-06-02 ENCOUNTER — Inpatient Hospital Stay (HOSPITAL_COMMUNITY)
Admission: AD | Admit: 2012-06-02 | Discharge: 2012-06-03 | Disposition: A | Discharge status: Home / Self Care | Payer: Medicaid Other | Source: Ambulatory Visit | Attending: Obstetrics | Admitting: Obstetrics

## 2012-06-02 ENCOUNTER — Encounter (HOSPITAL_COMMUNITY): Payer: Self-pay | Admitting: *Deleted

## 2012-06-02 DIAGNOSIS — O4700 False labor before 37 completed weeks of gestation, unspecified trimester: Secondary | ICD-10-CM | POA: Insufficient documentation

## 2012-06-02 LAB — URINALYSIS, ROUTINE W REFLEX MICROSCOPIC
Bilirubin Urine: NEGATIVE
Glucose, UA: NEGATIVE mg/dL
Hgb urine dipstick: NEGATIVE
Protein, ur: NEGATIVE mg/dL
Specific Gravity, Urine: <1.005 — ABNORMAL LOW (ref 1.005–1.030)
Urobilinogen, UA: 0.2 mg/dL (ref 0.0–1.0)

## 2012-06-02 MED ORDER — NIFEDIPINE 10 MG PO CAPS
10.0000 mg | ORAL_CAPSULE | Freq: Once | ORAL | Status: AC
  Administered 2012-06-02: 10 mg via ORAL
  Filled 2012-06-02: qty 1

## 2012-06-02 MED ORDER — LACTATED RINGERS IV SOLN: Freq: Once | INTRAVENOUS | Status: DC

## 2012-06-02 NOTE — MAU Note (Signed)
Pt states she woke up nauseous and vomiting . Pt states pain tightening got worse about 2 hours ago. Pt states she feels some tightening but that might be from the bowel movements (x4) today

## 2012-06-02 NOTE — MAU Note (Signed)
Pt reports she was seen here 3 nights ago and was contracting and was given IVF's and meds. Contractions returned today

## 2012-06-02 NOTE — Progress Notes (Signed)
Pt states she threw up x2 when she first got up today about 1100

## 2012-06-02 NOTE — MAU Provider Note (Signed)
History     CSN: [id number]  Arrival date and time: 06/02/12 2135   First Provider Initiated Contact with Patient 06/02/12 2218      Chief Complaint  Patient presents with  . Contractions   HPI Peggy [name] 21 y.o. [redacted]w[redacted]d  Comes to MAU with contractions all day.  Getting tighter but not having pain or vaginal bleeding.  No leaking.  Was here on 05-31-12 and had IFV and procardia which stopped the contractions.  OB History    Grav Para Term Preterm Abortions TAB SAB Ect Mult Living   1               Past Medical History  Diagnosis Date  . Asthma   . Pyloric stenosis     Past Surgical History  Procedure Date  . Pyloromyotomy   . No past surgeries     Family History  Problem Relation Age of Onset  . Diabetes Mother   . Hyperlipidemia Father     History  Substance Use Topics  . Smoking status: Current Everyday Smoker -- 0.2 packs/day  . Smokeless tobacco: Not on file  . Alcohol Use: No    Allergies: No Known Allergies  Prescriptions prior to admission  Medication Sig Dispense Refill  . albuterol (PROVENTIL HFA;VENTOLIN HFA) 108 (90 BASE) MCG/ACT inhaler Inhale 2 puffs into the lungs every 6 (six) hours as needed. For shortness of breath      . ferrous sulfate (FE TABS) 325 (65 FE) MG EC tablet Take 1 tablet (325 mg total) by mouth 2 (two) times daily with a meal.  60 tablet  0  . Prenatal Vit-Fe Fumarate-FA (PRENATAL MULTIVITAMIN) TABS Take 1 tablet by mouth daily.      . ranitidine (ZANTAC) 150 MG capsule Take 1 capsule (150 mg total) by mouth daily.  60 capsule  0    Review of Systems  Gastrointestinal: Positive for nausea, vomiting and diarrhea. Negative for abdominal pain and constipation.       Contractions  Genitourinary: Negative for dysuria.   Physical Exam   Blood pressure 119/75, pulse 95, temperature 99 F (37.2 C), temperature source Oral, resp. rate 18, height 5' (1.524 m), weight 149 lb (67.586 kg), last menstrual period 09/18/2011,  SpO2 99.00%.  Physical Exam  Nursing note and vitals reviewed. Constitutional: She is oriented to person, place, and time. She appears well-developed and well-nourished.  HENT:  Head: Normocephalic.  Eyes: EOM are normal.  Neck: Neck supple.  GI: Soft. There is no tenderness.       Contractions every 4-5 minutes, no pain FHT baseline 145  Genitourinary:       Cervix - anterior, closed, vtx -2  Musculoskeletal: Normal range of motion.  Neurological: She is alert and oriented to person, place, and time.  Skin: Skin is warm and dry.  Psychiatric: She has a normal mood and affect.    MAU Course  Procedures Results for orders placed during the hospital encounter of 06/02/12 (from the past 24 hour(s))  URINALYSIS, ROUTINE W REFLEX MICROSCOPIC     Status: Abnormal   Collection Time   06/02/12  9:45 PM      Component Value Range   Color, Urine YELLOW  YELLOW    APPearance CLEAR  CLEAR    Specific Gravity, Urine <1.005 (*) 1.005 - 1.030    pH 7.0  5.0 - 8.0    Glucose, UA NEGATIVE  NEGATIVE (mg/dL)   Hgb urine dipstick NEGATIVE  NEGATIVE  Bilirubin Urine NEGATIVE  NEGATIVE    Ketones, ur NEGATIVE  NEGATIVE (mg/dL)   Protein, ur NEGATIVE  NEGATIVE (mg/dL)   Urobilinogen, UA 0.2  0.0 - 1.0 (mg/dL)   Nitrite NEGATIVE  NEGATIVE    Leukocytes, UA NEGATIVE  NEGATIVE    MDM Consult with Dr. Clearance Coots re: plan of care - IVF and procardia. Client refused IVF 0104  After 2 doses of procardia, contractions have now stopped and FHT is now reactive with 15x15 accels noted  Assessment and Plan  Preterm labor  Plan rx procardia 30 xl PO BID (#60) Keep your appointment as scheduled in the office. Call your doctor if you have questions or concerns.   Darcie Mellone 06/02/2012, 10:36 PM

## 2012-06-03 LAB — OB RESULTS CONSOLE GBS: GBS: NEGATIVE

## 2012-06-03 MED ORDER — NIFEDIPINE ER OSMOTIC RELEASE 30 MG PO TB24: 30.0000 mg | ORAL_TABLET | Freq: Two times a day (BID) | ORAL | Status: DC

## 2012-06-03 NOTE — Discharge Instructions (Signed)
Get your prescription filled and take as directed beginning in the morning. Call your doctor if you have questions or concerns. Drink at least 8 8-oz glasses of water every day.

## 2012-07-07 ENCOUNTER — Other Ambulatory Visit: Payer: Self-pay | Admitting: Obstetrics

## 2012-07-08 ENCOUNTER — Telehealth (HOSPITAL_COMMUNITY): Payer: Self-pay | Admitting: *Deleted

## 2012-07-08 ENCOUNTER — Encounter (HOSPITAL_COMMUNITY): Payer: Self-pay | Admitting: *Deleted

## 2012-07-08 NOTE — Telephone Encounter (Signed)
Preadmission screen  

## 2012-07-09 ENCOUNTER — Inpatient Hospital Stay (HOSPITAL_COMMUNITY)
Admission: AD | Admit: 2012-07-09 | Discharge: 2012-07-12 | DRG: 775 | Disposition: A | Discharge status: Home / Self Care | Payer: Medicaid Other | Source: Ambulatory Visit | Attending: Obstetrics | Admitting: Obstetrics

## 2012-07-09 ENCOUNTER — Encounter (HOSPITAL_COMMUNITY): Payer: Self-pay | Admitting: Anesthesiology

## 2012-07-09 ENCOUNTER — Encounter (HOSPITAL_COMMUNITY): Payer: Self-pay | Admitting: *Deleted

## 2012-07-09 ENCOUNTER — Inpatient Hospital Stay (HOSPITAL_COMMUNITY): Payer: Medicaid Other | Admitting: Anesthesiology

## 2012-07-09 LAB — CBC
HCT: 33.9 % — ABNORMAL LOW (ref 36.0–46.0)
MCHC: 33 g/dL (ref 30.0–36.0)
Platelets: 315 10*3/uL (ref 150–400)
RDW: 13.4 % (ref 11.5–15.5)
WBC: 18.4 10*3/uL — ABNORMAL HIGH (ref 4.0–10.5)

## 2012-07-09 MED ORDER — OXYTOCIN BOLUS FROM INFUSION
250.0000 mL | Freq: Once | INTRAVENOUS | Status: AC
  Administered 2012-07-10: 250 mL via INTRAVENOUS
  Filled 2012-07-09: qty 500

## 2012-07-09 MED ORDER — PHENYLEPHRINE 40 MCG/ML (10ML) SYRINGE FOR IV PUSH (FOR BLOOD PRESSURE SUPPORT): 80.0000 ug | PREFILLED_SYRINGE | INTRAVENOUS | Status: DC | PRN

## 2012-07-09 MED ORDER — MISOPROSTOL 25 MCG QUARTER TABLET: 25.0000 ug | ORAL_TABLET | ORAL | Status: DC

## 2012-07-09 MED ORDER — OXYTOCIN 40 UNITS IN LACTATED RINGERS INFUSION - SIMPLE MED
1.0000 m[IU]/min | INTRAVENOUS | Status: DC
  Administered 2012-07-09: 1 m[IU]/min via INTRAVENOUS
  Filled 2012-07-09: qty 1000

## 2012-07-09 MED ORDER — FLEET ENEMA 7-19 GM/118ML RE ENEM: 1.0000 | ENEMA | RECTAL | Status: DC | PRN

## 2012-07-09 MED ORDER — EPHEDRINE 5 MG/ML INJ: 10.0000 mg | INTRAVENOUS | Status: DC | PRN

## 2012-07-09 MED ORDER — EPHEDRINE 5 MG/ML INJ
10.0000 mg | INTRAVENOUS | Status: DC | PRN
  Filled 2012-07-09: qty 4

## 2012-07-09 MED ORDER — LACTATED RINGERS IV SOLN
INTRAVENOUS | Status: DC
  Administered 2012-07-09: 125 mL via INTRAVENOUS
  Administered 2012-07-09: 10:00:00 via INTRAVENOUS
  Administered 2012-07-10: 125 mL/h via INTRAVENOUS

## 2012-07-09 MED ORDER — LACTATED RINGERS IV SOLN
500.0000 mL | INTRAVENOUS | Status: DC | PRN
  Administered 2012-07-09: 300 mL via INTRAVENOUS

## 2012-07-09 MED ORDER — TERBUTALINE SULFATE 1 MG/ML IJ SOLN: 0.2500 mg | Freq: Once | INTRAMUSCULAR | Status: AC | PRN

## 2012-07-09 MED ORDER — OXYCODONE-ACETAMINOPHEN 5-325 MG PO TABS
2.0000 | ORAL_TABLET | Freq: Once | ORAL | Status: AC
  Administered 2012-07-09: 2 via ORAL
  Filled 2012-07-09: qty 2

## 2012-07-09 MED ORDER — IBUPROFEN 600 MG PO TABS
600.0000 mg | ORAL_TABLET | Freq: Four times a day (QID) | ORAL | Status: DC | PRN
  Administered 2012-07-10: 600 mg via ORAL
  Filled 2012-07-09: qty 1

## 2012-07-09 MED ORDER — FENTANYL 2.5 MCG/ML BUPIVACAINE 1/10 % EPIDURAL INFUSION (WH - ANES)
14.0000 mL/h | INTRAMUSCULAR | Status: DC
  Administered 2012-07-09: 12 mL/h via EPIDURAL
  Administered 2012-07-10: 14 mL/h via EPIDURAL
  Filled 2012-07-09 (×2): qty 60

## 2012-07-09 MED ORDER — CITRIC ACID-SODIUM CITRATE 334-500 MG/5ML PO SOLN: 30.0000 mL | ORAL | Status: DC | PRN

## 2012-07-09 MED ORDER — NALBUPHINE HCL 10 MG/ML IJ SOLN
10.0000 mg | Freq: Four times a day (QID) | INTRAMUSCULAR | Status: DC | PRN
  Filled 2012-07-09: qty 1

## 2012-07-09 MED ORDER — LIDOCAINE HCL (PF) 1 % IJ SOLN
INTRAMUSCULAR | Status: DC | PRN
  Administered 2012-07-09 (×2): 5 mL

## 2012-07-09 MED ORDER — OXYTOCIN 40 UNITS IN LACTATED RINGERS INFUSION - SIMPLE MED
62.5000 mL/h | Freq: Once | INTRAVENOUS | Status: DC
  Filled 2012-07-09: qty 1000

## 2012-07-09 MED ORDER — PHENYLEPHRINE 40 MCG/ML (10ML) SYRINGE FOR IV PUSH (FOR BLOOD PRESSURE SUPPORT)
80.0000 ug | PREFILLED_SYRINGE | INTRAVENOUS | Status: DC | PRN
  Filled 2012-07-09: qty 5

## 2012-07-09 MED ORDER — ACETAMINOPHEN 325 MG PO TABS
650.0000 mg | ORAL_TABLET | ORAL | Status: DC | PRN
  Administered 2012-07-09: 650 mg via ORAL
  Filled 2012-07-09: qty 1

## 2012-07-09 MED ORDER — OXYTOCIN 40 UNITS IN LACTATED RINGERS INFUSION - SIMPLE MED: 1.0000 m[IU]/min | INTRAVENOUS | Status: DC

## 2012-07-09 MED ORDER — DIPHENHYDRAMINE HCL 50 MG/ML IJ SOLN: 12.5000 mg | INTRAMUSCULAR | Status: DC | PRN

## 2012-07-09 MED ORDER — NALBUPHINE SYRINGE 5 MG/0.5 ML
10.0000 mg | INJECTION | INTRAMUSCULAR | Status: DC | PRN
  Administered 2012-07-09 (×2): 10 mg via INTRAVENOUS
  Filled 2012-07-09 (×3): qty 1

## 2012-07-09 MED ORDER — NALBUPHINE SYRINGE 5 MG/0.5 ML
10.0000 mg | INJECTION | Freq: Four times a day (QID) | INTRAMUSCULAR | Status: DC | PRN
  Administered 2012-07-09: 10 mg via INTRAMUSCULAR
  Filled 2012-07-09 (×2): qty 1

## 2012-07-09 MED ORDER — PROMETHAZINE HCL 25 MG/ML IJ SOLN: 25.0000 mg | Freq: Four times a day (QID) | INTRAMUSCULAR | Status: DC | PRN

## 2012-07-09 MED ORDER — LIDOCAINE HCL (PF) 1 % IJ SOLN
30.0000 mL | INTRAMUSCULAR | Status: DC | PRN
  Filled 2012-07-09 (×2): qty 30

## 2012-07-09 MED ORDER — LACTATED RINGERS IV SOLN: 500.0000 mL | Freq: Once | INTRAVENOUS | Status: DC

## 2012-07-09 MED ORDER — OXYCODONE-ACETAMINOPHEN 5-325 MG PO TABS
1.0000 | ORAL_TABLET | ORAL | Status: DC | PRN
  Administered 2012-07-10: 2 via ORAL
  Administered 2012-07-10 – 2012-07-12 (×4): 1 via ORAL
  Filled 2012-07-09 (×2): qty 1
  Filled 2012-07-09: qty 2
  Filled 2012-07-09: qty 1

## 2012-07-09 MED ORDER — ONDANSETRON HCL 4 MG/2ML IJ SOLN: 4.0000 mg | Freq: Four times a day (QID) | INTRAMUSCULAR | Status: DC | PRN

## 2012-07-09 NOTE — MAU Note (Signed)
Dr. Clearance Coots notified pt's cervix unchanged, efm tracing non-reactive, percocet helped pt's pain very little. Pt drinking juice at present. GBS negative. Will admit with routine orders.

## 2012-07-09 NOTE — Progress Notes (Signed)
Water broke @ 1825 while pt was in BR.  Pt back in bed @ 1831

## 2012-07-09 NOTE — MAU Note (Signed)
Ctx's started last night. Every 7-8 minutes.  Denies problems with preg. Wet spot noted on bed tonight.reddish/brown d/c noted this morning.

## 2012-07-09 NOTE — Progress Notes (Signed)
Update given. Orders received to discontinue pitocin and allow pt to eat and shower.  Order to place cytotec at 2200

## 2012-07-09 NOTE — MAU Note (Signed)
Dr. Clearance Coots notified pt in MAU for c/o ctx's, ctx's q2-5 minutes apart, membranes stripped in office yesterday. Cervix 2/85/-2 intact. Orders to give po percocet and d/c home.

## 2012-07-09 NOTE — Anesthesia Procedure Notes (Signed)
Epidural Patient location during procedure: OB Start time: 07/09/2012 10:57 PM  Staffing Anesthesiologist: Brayton Caves R Performed by: anesthesiologist   Preanesthetic Checklist Completed: patient identified, site marked, surgical consent, pre-op evaluation, timeout performed, IV checked, risks and benefits discussed and monitors and equipment checked  Epidural Patient position: sitting Prep: site prepped and draped and DuraPrep Patient monitoring: continuous pulse ox and blood pressure Approach: midline Injection technique: LOR air and LOR saline  Needle:  Needle type: Tuohy  Needle gauge: 17 G Needle length: 9 cm Needle insertion depth: 5 cm cm Catheter type: closed end flexible Catheter size: 19 Gauge Catheter at skin depth: 10 cm Test dose: negative  Assessment Events: blood not aspirated, injection not painful, no injection resistance, negative IV test and no paresthesia  Additional Notes Patient identified.  Risk benefits discussed including failed block, incomplete pain control, headache, nerve damage, paralysis, blood pressure changes, nausea, vomiting, reactions to medication both toxic or allergic, and postpartum back pain.  Patient expressed understanding and wished to proceed.  All questions were answered.  Sterile technique used throughout procedure and epidural site dressed with sterile barrier dressing. No paresthesia or other complications noted.The patient did not experience any signs of intravascular injection such as tinnitus or metallic taste in mouth nor signs of intrathecal spread such as rapid motor block. Please see nursing notes for vital signs.

## 2012-07-09 NOTE — Anesthesia Preprocedure Evaluation (Signed)
Anesthesia Evaluation  Patient identified by MRN, date of birth, ID band Patient awake    Reviewed: Allergy & Precautions, H&P , Patient's Chart, lab work & pertinent test results  Airway Mallampati: II TM Distance: >3 FB Neck ROM: full    Dental No notable dental hx.    Pulmonary neg pulmonary ROS, asthma ,  breath sounds clear to auscultation  Pulmonary exam normal       Cardiovascular negative cardio ROS  Rhythm:regular Rate:Normal     Neuro/Psych negative neurological ROS  negative psych ROS   GI/Hepatic negative GI ROS, Neg liver ROS,   Endo/Other  negative endocrine ROS  Renal/GU negative Renal ROS     Musculoskeletal   Abdominal   Peds  Hematology negative hematology ROS (+)   Anesthesia Other Findings   Reproductive/Obstetrics (+) Pregnancy                           Anesthesia Physical Anesthesia Plan  ASA: II  Anesthesia Plan: Epidural   Post-op Pain Management:    Induction:   Airway Management Planned:   Additional Equipment:   Intra-op Plan:   Post-operative Plan:   Informed Consent: I have reviewed the patients History and Physical, chart, labs and discussed the procedure including the risks, benefits and alternatives for the proposed anesthesia with the patient or authorized representative who has indicated his/her understanding and acceptance.     Plan Discussed with:   Anesthesia Plan Comments:         Anesthesia Quick Evaluation  

## 2012-07-09 NOTE — H&P (Signed)
Peggy [name] is a 21 y.o. female presenting for UC's. Maternal Medical History:  Reason for admission: Reason for admission: contractions.  21 yo G1   EDC 07-12-12.  Presents with UC's.  NST non reactive with a few mild variables.   Contractions: Onset was 3-5 hours ago.   Frequency: irregular.   Perceived severity is moderate.    Fetal activity: Perceived fetal activity is normal.   Last perceived fetal movement was within the past hour.    Prenatal complications: no prenatal complications Prenatal Complications - Diabetes: none.    OB History    Grav Para Term Preterm Abortions TAB SAB Ect Mult Living   1 0 0 0 0 0 0 0 0 0      Past Medical History  Diagnosis Date  . Asthma   . Pyloric stenosis    Past Surgical History  Procedure Date  . Pyloromyotomy    Family History: family history includes Arthritis in her mother and paternal grandmother; Asthma in her father and paternal grandmother; COPD in her maternal grandfather; Cancer in her mother; Depression in her mother and paternal grandmother; Diabetes in her paternal grandmother; Heart disease in her paternal grandmother; Hyperlipidemia in her father; and Hypertension in her father and paternal grandmother.  There is no history of Other. Social History:  reports that she has been smoking Cigarettes.  She has been smoking about .25 packs per day. She has never used smokeless tobacco. She reports that she does not drink alcohol or use illicit drugs.   Prenatal Transfer Tool  Maternal Diabetes: No Genetic Screening: Normal Maternal Ultrasounds/Referrals: Normal Fetal Ultrasounds or other Referrals:  None Maternal Substance Abuse:  No Significant Maternal Medications:  None Significant Maternal Lab Results:  Lab values include: Group B Strep negative Other Comments:  None  Review of Systems  All other systems reviewed and are negative.    Dilation: 2 Effacement (%): 90 Station: -2 Exam by:: Christy  Goodnight,RN Blood pressure 133/85, pulse 105, temperature 98.6 F (37 C), temperature source Oral, resp. rate 20, height 5' 0.25" (1.53 m), weight 68.04 kg (150 lb), last menstrual period 09/18/2011. Maternal Exam:  Uterine Assessment: Contraction strength is moderate.  Contraction frequency is irregular.   Abdomen: Patient reports no abdominal tenderness. Fetal presentation: vertex  Introitus: Normal vulva. Normal vagina.    Physical Exam  Nursing note and vitals reviewed. Constitutional: She is oriented to person, place, and time. She appears well-developed and well-nourished.  HENT:  Head: Normocephalic and atraumatic.  Eyes: Conjunctivae are normal. Pupils are equal, round, and reactive to light.  Neck: Normal range of motion. Neck supple.  Cardiovascular: Normal rate and regular rhythm.   Respiratory: Effort normal.  GI: Soft.  Genitourinary: Vagina normal and uterus normal.  Musculoskeletal: Normal range of motion.  Neurological: She is alert and oriented to person, place, and time.  Skin: Skin is warm and dry.  Psychiatric: She has a normal mood and affect. Her behavior is normal. Judgment and thought content normal.   Cervix:  1-2 cm/70%/-2/Vtx Prenatal labs: ABO, Rh: A/Positive/-- (11/20 0000) Antibody:   Rubella: Immune (11/20 0000) RPR: Nonreactive (11/20 0000)  HBsAg: Negative (11/20 0000)  HIV: Non-reactive (11/20 0000)  GBS: Negative (06/05 0000)   Assessment/Plan: 39 weeks. UG's.  Non reactive NST.  Admitted for cervical ripening.   HARPER,CHARLES A 07/09/2012, 5:01 PM

## 2012-07-10 ENCOUNTER — Encounter (HOSPITAL_COMMUNITY): Payer: Self-pay | Admitting: Family Medicine

## 2012-07-10 MED ORDER — WITCH HAZEL-GLYCERIN EX PADS: 1.0000 "application " | MEDICATED_PAD | CUTANEOUS | Status: DC | PRN

## 2012-07-10 MED ORDER — SENNOSIDES-DOCUSATE SODIUM 8.6-50 MG PO TABS
2.0000 | ORAL_TABLET | Freq: Every day | ORAL | Status: DC
  Administered 2012-07-10 – 2012-07-11 (×2): 2 via ORAL

## 2012-07-10 MED ORDER — PRENATAL MULTIVITAMIN CH
1.0000 | ORAL_TABLET | Freq: Every day | ORAL | Status: DC
  Administered 2012-07-11 – 2012-07-12 (×2): 1 via ORAL
  Filled 2012-07-10 (×2): qty 1

## 2012-07-10 MED ORDER — TETANUS-DIPHTH-ACELL PERTUSSIS 5-2.5-18.5 LF-MCG/0.5 IM SUSP: 0.5000 mL | Freq: Once | INTRAMUSCULAR | Status: DC

## 2012-07-10 MED ORDER — ONDANSETRON HCL 4 MG/2ML IJ SOLN: 4.0000 mg | INTRAMUSCULAR | Status: DC | PRN

## 2012-07-10 MED ORDER — ZOLPIDEM TARTRATE 5 MG PO TABS: 5.0000 mg | ORAL_TABLET | Freq: Every evening | ORAL | Status: DC | PRN

## 2012-07-10 MED ORDER — SIMETHICONE 80 MG PO CHEW: 80.0000 mg | CHEWABLE_TABLET | ORAL | Status: DC | PRN

## 2012-07-10 MED ORDER — OXYCODONE-ACETAMINOPHEN 5-325 MG PO TABS
1.0000 | ORAL_TABLET | ORAL | Status: DC | PRN
  Filled 2012-07-10 (×3): qty 1

## 2012-07-10 MED ORDER — IBUPROFEN 600 MG PO TABS
600.0000 mg | ORAL_TABLET | Freq: Four times a day (QID) | ORAL | Status: DC
  Administered 2012-07-10 – 2012-07-12 (×7): 600 mg via ORAL
  Filled 2012-07-10 (×9): qty 1

## 2012-07-10 MED ORDER — LANOLIN HYDROUS EX OINT: TOPICAL_OINTMENT | CUTANEOUS | Status: DC | PRN

## 2012-07-10 MED ORDER — DIBUCAINE 1 % RE OINT: 1.0000 "application " | TOPICAL_OINTMENT | RECTAL | Status: DC | PRN

## 2012-07-10 MED ORDER — DIPHENHYDRAMINE HCL 25 MG PO CAPS: 25.0000 mg | ORAL_CAPSULE | Freq: Four times a day (QID) | ORAL | Status: DC | PRN

## 2012-07-10 MED ORDER — ONDANSETRON HCL 4 MG PO TABS: 4.0000 mg | ORAL_TABLET | ORAL | Status: DC | PRN

## 2012-07-10 MED ORDER — BENZOCAINE-MENTHOL 20-0.5 % EX AERO: 1.0000 "application " | INHALATION_SPRAY | CUTANEOUS | Status: DC | PRN

## 2012-07-10 MED ORDER — FERROUS SULFATE 325 (65 FE) MG PO TABS
325.0000 mg | ORAL_TABLET | Freq: Two times a day (BID) | ORAL | Status: DC
  Administered 2012-07-11 – 2012-07-12 (×3): 325 mg via ORAL
  Filled 2012-07-10 (×3): qty 1

## 2012-07-10 NOTE — Progress Notes (Signed)
UR Chart review completed.  

## 2012-07-10 NOTE — Anesthesia Postprocedure Evaluation (Signed)
Anesthesia Post Note  Patient: Peggy [name]  Procedure(s) Performed: * No procedures listed *  Anesthesia type: Epidural  Patient location: Mother/Baby  Post pain: Pain level controlled  Post assessment: Post-op Vital signs reviewed  Last Vitals:  Filed Vitals:   07/10/12 0610  BP: 133/82  Pulse: 98  Temp: 37.4 C  Resp: 18    Post vital signs: Reviewed  Level of consciousness: awake  Complications: No apparent anesthesia complications

## 2012-07-10 NOTE — Progress Notes (Signed)
Post Partum Day 0 Subjective: no complaints  Objective: Blood pressure 133/82, pulse 98, temperature 99.3 F (37.4 C), temperature source Oral, resp. rate 18, height 5' 0.25" (1.53 m), weight 68.04 kg (150 lb), last menstrual period 09/18/2011, SpO2 98.00%, unknown if currently breastfeeding.  Physical Exam:  General: alert and no distress Lochia: appropriate Uterine Fundus: firm Incision: healing well DVT Evaluation: No evidence of DVT seen on physical exam.   Basename 07/09/12 1025  HGB 11.2*  HCT 33.9*    Assessment/Plan: Doing well.  Routine.   LOS: 1 day   Miki Labuda A 07/10/2012, 12:45 PM

## 2012-07-11 LAB — CBC
HCT: 31.5 % — ABNORMAL LOW (ref 36.0–46.0)
Hemoglobin: 10.1 g/dL — ABNORMAL LOW (ref 12.0–15.0)
RDW: 14 % (ref 11.5–15.5)
WBC: 16.3 10*3/uL — ABNORMAL HIGH (ref 4.0–10.5)

## 2012-07-11 LAB — RPR: RPR Ser Ql: NONREACTIVE

## 2012-07-11 NOTE — Progress Notes (Signed)
Patient ID: Peggy [name], female   DOB: [birth year], 21 y.o.   MRN: [id number] Post Partum Day 1 Subjective: no complaints  Objective: Blood pressure 117/76, pulse 72, temperature 97.5 F (36.4 C), temperature source Oral, resp. rate 18, height 5' 0.25" (1.53 m), weight 68.04 kg (150 lb), last menstrual period 09/18/2011, SpO2 98.00%, unknown if currently breastfeeding.  Physical Exam:  General: alert and no distress Lochia: appropriate Uterine Fundus: firm Incision: healing well DVT Evaluation: No evidence of DVT seen on physical exam.   Basename 07/11/12 0535 07/09/12 1025  HGB 10.1* 11.2*  HCT 31.5* 33.9*    Assessment/Plan: Doing well.  Routine.   LOS: 2 days   JACKSON-MOORE,Amonda Brillhart A 07/11/2012, 9:59 AM

## 2012-07-12 MED ORDER — OXYCODONE-ACETAMINOPHEN 5-325 MG PO TABS: 1.0000 | ORAL_TABLET | Freq: Four times a day (QID) | ORAL | Status: AC | PRN

## 2012-07-12 MED ORDER — PNEUMOCOCCAL VAC POLYVALENT 25 MCG/0.5ML IJ INJ
0.5000 mL | INJECTION | Freq: Once | INTRAMUSCULAR | Status: AC
  Administered 2012-07-12: 0.5 mL via INTRAMUSCULAR
  Filled 2012-07-12: qty 0.5

## 2012-07-12 NOTE — Discharge Summary (Signed)
  Obstetric Discharge Summary Reason for Admission: onset of labor Prenatal Procedures: none Intrapartum Procedures: spontaneous vaginal delivery Postpartum Procedures: none Complications-Operative and Postpartum: none  Hemoglobin  Date Value Range Status  07/11/2012 10.1* 12.0 - 15.0 g/dL Final     HCT  Date Value Range Status  07/11/2012 31.5* 36.0 - 46.0 % Final    Physical Exam:  General: alert Lochia: appropriate Uterine: firm Incision: C/D/I DVT Evaluation: No evidence of DVT seen on physical exam.  Discharge Diagnoses: Term Pregnancy-delivered  Discharge Information: Date: 07/12/2012 Activity: pelvic rest Diet: routine Medications:  Prior to Admission medications   Medication Sig Start Date End Date Taking? Authorizing Provider  albuterol (PROVENTIL HFA;VENTOLIN HFA) 108 (90 BASE) MCG/ACT inhaler Inhale 2 puffs into the lungs every 6 (six) hours as needed. For shortness of breath   Yes Historical Provider, MD  ferrous sulfate (FE TABS) 325 (65 FE) MG EC tablet Take 1 tablet (325 mg total) by mouth 2 (two) times daily with a meal. 06/01/12 06/01/13 Yes Hope Orlene Och, NP  Prenatal Vit-Fe Fumarate-FA (PRENATAL MULTIVITAMIN) TABS Take 1 tablet by mouth daily.   Yes Historical Provider, MD  ranitidine (ZANTAC) 150 MG capsule Take 1 capsule (150 mg total) by mouth daily. 06/01/12 06/01/13 Yes Hope Orlene Och, NP  oxyCODONE-acetaminophen (PERCOCET) 5-325 MG per tablet Take 1-2 tablets by mouth every 6 (six) hours as needed (for pain scale > 4). 07/12/12 07/22/12  Antionette Char, MD    Condition: stable Instructions: refer to routine discharge instructions Discharge to: home Follow-up Information    Follow up with HARPER,CHARLES A, MD. Schedule an appointment as soon as possible for a visit in 2 weeks.   Contact information:   [address] [place] [address] [phone]          Newborn Data: Live born  Information for the patient's newborn:   Sabria, Florido Girl [name] [[id number]]  female   Antionette Char A 07/12/2012, 9:49 AM

## 2012-07-14 ENCOUNTER — Inpatient Hospital Stay (HOSPITAL_COMMUNITY): Admission: RE | Admit: 2012-07-14 | Payer: Medicaid Other | Source: Ambulatory Visit

## 2012-07-24 ENCOUNTER — Encounter (HOSPITAL_COMMUNITY): Payer: Self-pay | Admitting: *Deleted

## 2012-07-24 ENCOUNTER — Inpatient Hospital Stay (HOSPITAL_COMMUNITY)
Admission: AD | Admit: 2012-07-24 | Discharge: 2012-07-24 | Disposition: A | Discharge status: Home / Self Care | Payer: Medicaid Other | Source: Ambulatory Visit | Attending: Obstetrics & Gynecology | Admitting: Obstetrics & Gynecology

## 2012-07-24 DIAGNOSIS — N61 Mastitis without abscess: Secondary | ICD-10-CM

## 2012-07-24 DIAGNOSIS — O9122 Nonpurulent mastitis associated with the puerperium: Secondary | ICD-10-CM | POA: Insufficient documentation

## 2012-07-24 MED ORDER — CEPHALEXIN 500 MG PO CAPS: 500.0000 mg | ORAL_CAPSULE | Freq: Four times a day (QID) | ORAL | Status: AC

## 2012-07-24 MED ORDER — IBUPROFEN 600 MG PO TABS: 600.0000 mg | ORAL_TABLET | Freq: Four times a day (QID) | ORAL | Status: AC | PRN

## 2012-07-24 MED ORDER — OXYCODONE-ACETAMINOPHEN 5-325 MG PO TABS
1.0000 | ORAL_TABLET | ORAL | Status: AC
  Administered 2012-07-24: 1 via ORAL
  Filled 2012-07-24: qty 1

## 2012-07-24 MED ORDER — IBUPROFEN 600 MG PO TABS
600.0000 mg | ORAL_TABLET | ORAL | Status: AC
  Administered 2012-07-24: 600 mg via ORAL
  Filled 2012-07-24: qty 1

## 2012-07-24 NOTE — MAU Provider Note (Signed)
  History     CSN: [id number]  Arrival date and time: 07/24/12 1434   First Provider Initiated Contact with Patient 07/24/12 1553     21 y.o.  Chief Complaint  Patient presents with  . Breast Pain   HPI [id number] presents postpartum after NSVD on 07/09/12 with pain in her left breast, redness of the left breast and a fever at home before coming in.  She is currently breastfeeding and is worried that a breast infection could harm the baby.  She denies urinary symptoms, h/a, dizziness, or n/v.  She reports feeling mild chills and body aches currently.     Past Medical History  Diagnosis Date  . Asthma   . Pyloric stenosis     Past Surgical History  Procedure Date  . Pyloromyotomy     Family History  Problem Relation Age of Onset  . Arthritis Mother   . Cancer Mother     thyroid  . Depression Mother   . Hyperlipidemia Father   . Asthma Father   . Hypertension Father   . COPD Maternal Grandfather   . Diabetes Paternal Grandmother   . Arthritis Paternal Grandmother   . Asthma Paternal Grandmother   . Heart disease Paternal Grandmother   . Depression Paternal Grandmother   . Hypertension Paternal Grandmother   . Other Neg Hx     History  Substance Use Topics  . Smoking status: Current Some Day Smoker -- 0.2 packs/day    Types: Cigarettes    Last Attempt to Quit: 05/08/2012  . Smokeless tobacco: Never Used  . Alcohol Use: No    Allergies: No Known Allergies  Prescriptions prior to admission  Medication Sig Dispense Refill  . albuterol (PROVENTIL HFA;VENTOLIN HFA) 108 (90 BASE) MCG/ACT inhaler Inhale 2 puffs into the lungs every 6 (six) hours as needed. For shortness of breath        Review of Systems  Constitutional: Negative for fever, chills and malaise/fatigue.  Eyes: Negative for blurred vision.  Respiratory: Negative for cough and shortness of breath.   Cardiovascular: Negative for chest pain.  Gastrointestinal: Negative for heartburn, nausea and  vomiting.  Genitourinary: Negative for dysuria, urgency and frequency.  Musculoskeletal: Negative.   Neurological: Negative for dizziness and headaches.  Psychiatric/Behavioral: Negative for depression.   Physical Exam   Blood pressure 118/76, pulse 99, temperature 97.4 F (36.3 C), temperature source Axillary, resp. rate 18, weight 58.968 kg (130 lb), last menstrual period 09/18/2011, SpO2 98.00%, currently breastfeeding.  Physical Exam  Nursing note and vitals reviewed. Constitutional: She is oriented to person, place, and time. She appears well-developed and well-nourished.  Neck: Normal range of motion.  Cardiovascular: Normal rate, regular rhythm and normal heart sounds.   Respiratory: Effort normal and breath sounds normal.    GI: Soft.  Musculoskeletal: Normal range of motion.  Neurological: She is alert and oriented to person, place, and time.  Skin: Skin is warm and dry.  Psychiatric: She has a normal mood and affect. Her behavior is normal. Judgment and thought content normal.    MAU Course  Procedures Percocet 5/325 x1 and ibuprofen 600 mg x1 in MAU   Assessment and Plan  Mastitis during lactation  D/C home with infection precautions Keflex 500 mg QID x10 days Continue to breastfeed with frequent emptying of both breasts F/U with prenatal/postpartum provider or return to MAU as needed  LEFTWICH-KIRBY, Jaquarious Grey 07/24/2012, 4:31 PM

## 2012-07-24 NOTE — MAU Note (Signed)
Pt reports L Breast pain since this AM. Pt states that she had a fever at 10:30 and then took some percocet. Pt is breastfeeding

## 2013-02-13 ENCOUNTER — Encounter (HOSPITAL_COMMUNITY): Payer: Self-pay | Admitting: *Deleted

## 2013-02-13 ENCOUNTER — Inpatient Hospital Stay (HOSPITAL_COMMUNITY)
Admission: AD | Admit: 2013-02-13 | Discharge: 2013-02-13 | Disposition: A | Discharge status: Home / Self Care | Payer: Self-pay | Source: Ambulatory Visit | Attending: Obstetrics | Admitting: Obstetrics

## 2013-02-13 DIAGNOSIS — A084 Viral intestinal infection, unspecified: Secondary | ICD-10-CM

## 2013-02-13 DIAGNOSIS — N926 Irregular menstruation, unspecified: Secondary | ICD-10-CM | POA: Insufficient documentation

## 2013-02-13 DIAGNOSIS — A088 Other specified intestinal infections: Secondary | ICD-10-CM | POA: Insufficient documentation

## 2013-02-13 DIAGNOSIS — R109 Unspecified abdominal pain: Secondary | ICD-10-CM | POA: Insufficient documentation

## 2013-02-13 LAB — COMPREHENSIVE METABOLIC PANEL
ALT: 15 U/L (ref 0–35)
AST: 17 U/L (ref 0–37)
Albumin: 3.8 g/dL (ref 3.5–5.2)
Calcium: 9.2 mg/dL (ref 8.4–10.5)
GFR calc Af Amer: >90 mL/min (ref >90)
Glucose, Bld: 95 mg/dL (ref 70–99)
Sodium: 139 mEq/L (ref 135–145)
Total Protein: 7.4 g/dL (ref 6.0–8.3)

## 2013-02-13 LAB — URINALYSIS, ROUTINE W REFLEX MICROSCOPIC
Glucose, UA: NEGATIVE mg/dL
Leukocytes, UA: NEGATIVE
Nitrite: NEGATIVE
Protein, ur: NEGATIVE mg/dL

## 2013-02-13 LAB — POCT PREGNANCY, URINE: Preg Test, Ur: NEGATIVE

## 2013-02-13 LAB — WET PREP, GENITAL: Clue Cells Wet Prep HPF POC: NONE SEEN

## 2013-02-13 LAB — CBC
MCH: 29.1 pg (ref 26.0–34.0)
MCHC: 33.3 g/dL (ref 30.0–36.0)
Platelets: 319 10*3/uL (ref 150–400)
RDW: 13.5 % (ref 11.5–15.5)

## 2013-02-13 NOTE — MAU Note (Signed)
Started bleeding 2/11 and then had pain that was so bad.  Started feeling sick after eating.  Bleeding has stopped now

## 2013-02-13 NOTE — MAU Note (Signed)
Currently has nexplanon for birth control

## 2013-02-13 NOTE — MAU Provider Note (Signed)
Chief Complaint: Abdominal Pain and Vaginal Bleeding   First Provider Initiated Contact with Patient 02/13/13 1352     SUBJECTIVE HPI: Peggy [name] is a 22 y.o. G1P1001 who presents to maternity admissions reporting vaginal bleeding starting 4 days ago and finishing yesterday, described as bright red and requiring tampon changes every few hours.  She also started having abdominal pain described as menstrual cramping 2 days ago and some upper abdominal pain and burning in her chest along with vomiting x2. She reports these symptoms are not aggravated by food.  She had a baby in July and has had spotting since her Nexplanon was placed in September, but no regular period until now. She denies vaginal itching/burning, urinary symptoms, h/a, dizziness, fever/chills, or exposure to illness recently.     Past Medical History  Diagnosis Date  . Asthma   . Pyloric stenosis    Past Surgical History  Procedure Laterality Date  . Pyloromyotomy     History   Social History  . Marital Status: Married    Spouse Name: N/A    Number of Children: N/A  . Years of Education: N/A   Occupational History  . Not on file.   Social History Main Topics  . Smoking status: Current Some Day Smoker -- 0.25 packs/day    Types: Cigarettes    Last Attempt to Quit: 05/08/2012  . Smokeless tobacco: Never Used  . Alcohol Use: No  . Drug Use: No  . Sexually Active: Yes    Birth Control/ Protection: None   Other Topics Concern  . Not on file   Social History Narrative  . No narrative on file   No current facility-administered medications on file prior to encounter.   Current Outpatient Prescriptions on File Prior to Encounter  Medication Sig Dispense Refill  . albuterol (PROVENTIL HFA;VENTOLIN HFA) 108 (90 BASE) MCG/ACT inhaler Inhale 2 puffs into the lungs every 6 (six) hours as needed. For shortness of breath       No Known Allergies  ROS: Pertinent items in HPI  OBJECTIVE Blood pressure  133/74, pulse 86, temperature 99.2 F (37.3 C), temperature source Oral, resp. rate 18, height 5' (1.524 m), weight 58.968 kg (130 lb). GENERAL: Well-developed, well-nourished female in no acute distress.  HEENT: Normocephalic HEART: normal rate RESP: normal effort ABDOMEN: Soft, non-tender EXTREMITIES: Nontender, no edema NEURO: Alert and oriented Pelvic exam: Cervix pink, visually closed, without lesion, scant white creamy discharge, vaginal walls and external genitalia normal Bimanual exam: Cervix 0/long/high, firm, anterior, neg CMT, uterus nontender, nonenlarged, adnexa without tenderness, enlargement, or mass  LAB RESULTS Results for orders placed during the hospital encounter of 02/13/13 (from the past 168 hour(s))  URINALYSIS, ROUTINE W REFLEX MICROSCOPIC   Collection Time    02/13/13  1:12 PM      Result Value Range   Color, Urine YELLOW  YELLOW   APPearance CLEAR  CLEAR   Specific Gravity, Urine 1.010  1.005 - 1.030   pH 8.0  5.0 - 8.0   Glucose, UA NEGATIVE  NEGATIVE mg/dL   Hgb urine dipstick NEGATIVE  NEGATIVE   Bilirubin Urine NEGATIVE  NEGATIVE   Ketones, ur NEGATIVE  NEGATIVE mg/dL   Protein, ur NEGATIVE  NEGATIVE mg/dL   Urobilinogen, UA 1.0  0.0 - 1.0 mg/dL   Nitrite NEGATIVE  NEGATIVE   Leukocytes, UA NEGATIVE  NEGATIVE  POCT PREGNANCY, URINE   Collection Time    02/13/13  1:21 PM      Result  Value Range   Preg Test, Ur NEGATIVE  NEGATIVE  CBC   Collection Time    02/13/13  2:11 PM      Result Value Range   WBC 6.1  4.0 - 10.5 K/uL   RBC 4.54  3.87 - 5.11 MIL/uL   Hemoglobin 13.2  12.0 - 15.0 g/dL   HCT 16.1  09.6 - 04.5 %   MCV 87.2  78.0 - 100.0 fL   MCH 29.1  26.0 - 34.0 pg   MCHC 33.3  30.0 - 36.0 g/dL   RDW 40.9  81.1 - 91.4 %   Platelets 319  150 - 400 K/uL  COMPREHENSIVE METABOLIC PANEL   Collection Time    02/13/13  2:11 PM      Result Value Range   Sodium 139  135 - 145 mEq/L   Potassium 3.7  3.5 - 5.1 mEq/L   Chloride 104  96 - 112  mEq/L   CO2 24  19 - 32 mEq/L   Glucose, Bld 95  70 - 99 mg/dL   BUN 10  6 - 23 mg/dL   Creatinine, Ser 7.82  0.50 - 1.10 mg/dL   Calcium 9.2  8.4 - 95.6 mg/dL   Total Protein 7.4  6.0 - 8.3 g/dL   Albumin 3.8  3.5 - 5.2 g/dL   AST 17  0 - 37 U/L   ALT 15  0 - 35 U/L   Alkaline Phosphatase 95  39 - 117 U/L   Total Bilirubin 0.1 (*) 0.3 - 1.2 mg/dL   GFR calc non Af Amer >90  >90 mL/min   GFR calc Af Amer >90  >90 mL/min  WET PREP, GENITAL   Collection Time    02/13/13  2:12 PM      Result Value Range   Yeast Wet Prep HPF POC NONE SEEN  NONE SEEN   Trich, Wet Prep NONE SEEN  NONE SEEN   Clue Cells Wet Prep HPF POC NONE SEEN  NONE SEEN   WBC, Wet Prep HPF POC FEW (*) NONE SEEN  GC/CHLAMYDIA PROBE AMP   Collection Time    02/13/13  2:13 PM      Result Value Range   CT Probe RNA NEGATIVE  NEGATIVE   GC Probe RNA NEGATIVE  NEGATIVE     ASSESSMENT 1. Irregular menses   2. Viral gastroenteritis     PLAN Discharge home Discussed normal course/bleeding patterns with Nexplanon Pt denies need for antiemetic medications today Drink plenty of fluids Pt given contact information for gyn clinic for f/u for irregular menses if these persist Return to MAU as needed   Sharen Counter Certified Nurse-Midwife 02/13/2013  1:57 PM

## 2013-02-14 LAB — GC/CHLAMYDIA PROBE AMP: CT Probe RNA: NEGATIVE

## 2013-05-29 ENCOUNTER — Encounter (HOSPITAL_COMMUNITY): Payer: Self-pay | Admitting: Emergency Medicine

## 2013-05-29 ENCOUNTER — Emergency Department (INDEPENDENT_AMBULATORY_CARE_PROVIDER_SITE_OTHER)
Admission: EM | Admit: 2013-05-29 | Discharge: 2013-05-29 | Disposition: A | Discharge status: Home / Self Care | Payer: Self-pay | Source: Home / Self Care | Attending: Emergency Medicine | Admitting: Emergency Medicine

## 2013-05-29 DIAGNOSIS — L259 Unspecified contact dermatitis, unspecified cause: Secondary | ICD-10-CM

## 2013-05-29 DIAGNOSIS — J45901 Unspecified asthma with (acute) exacerbation: Secondary | ICD-10-CM

## 2013-05-29 DIAGNOSIS — J45909 Unspecified asthma, uncomplicated: Secondary | ICD-10-CM

## 2013-05-29 LAB — POCT PREGNANCY, URINE: Preg Test, Ur: NEGATIVE

## 2013-05-29 MED ORDER — IPRATROPIUM BROMIDE 0.02 % IN SOLN
0.5000 mg | Freq: Once | RESPIRATORY_TRACT | Status: AC
  Administered 2013-05-29: 0.5 mg via RESPIRATORY_TRACT

## 2013-05-29 MED ORDER — PREDNISONE 20 MG PO TABS
ORAL_TABLET | ORAL | Status: AC
  Filled 2013-05-29: qty 3

## 2013-05-29 MED ORDER — TRIAMCINOLONE ACETONIDE 0.1 % EX CREA: TOPICAL_CREAM | Freq: Three times a day (TID) | CUTANEOUS | Status: DC

## 2013-05-29 MED ORDER — ALBUTEROL SULFATE HFA 108 (90 BASE) MCG/ACT IN AERS: 1.0000 | INHALATION_SPRAY | Freq: Four times a day (QID) | RESPIRATORY_TRACT | Status: DC | PRN

## 2013-05-29 MED ORDER — ALBUTEROL SULFATE (5 MG/ML) 0.5% IN NEBU
5.0000 mg | INHALATION_SOLUTION | Freq: Once | RESPIRATORY_TRACT | Status: AC
  Administered 2013-05-29: 5 mg via RESPIRATORY_TRACT

## 2013-05-29 MED ORDER — PREDNISONE 20 MG PO TABS
60.0000 mg | ORAL_TABLET | Freq: Once | ORAL | Status: AC
  Administered 2013-05-29: 60 mg via ORAL

## 2013-05-29 MED ORDER — ALBUTEROL SULFATE (5 MG/ML) 0.5% IN NEBU
INHALATION_SOLUTION | RESPIRATORY_TRACT | Status: AC
  Filled 2013-05-29: qty 1

## 2013-05-29 MED ORDER — PREDNISONE 20 MG PO TABS: 20.0000 mg | ORAL_TABLET | Freq: Two times a day (BID) | ORAL | Status: DC

## 2013-05-29 NOTE — ED Notes (Signed)
Provided soft drinks / ice and crackers and peanut butter to patient and family

## 2013-05-29 NOTE — ED Notes (Signed)
See prior notation

## 2013-05-29 NOTE — ED Provider Notes (Signed)
Chief Complaint:   Chief Complaint  Patient presents with  . Asthma    History of Present Illness:   Peggy [name] is a 22 year old female who has had asthma since birth. Her attacks are infrequent and intermittent. She uses an albuterol inhaler on an as-needed basis. She estimates she uses it less than once per week and one canister can last up to 6 months. She's not been to the emergency room in the last month has never been hospitalized or on a ventilator with it. The current attack began 2 days ago. She's had coughing, wheezing, chest tightness, and shortness of breath. She denies any fever, chills, nasal congestion, rhinorrhea, sore throat, or chest pain she also has had a rash on her abdomen since yesterday. It itches. She's also had some aching in her legs and back but no rash. She has not been exposed to any allergens. She does recall that she got a sunburn in this area about a week ago. She's not been putting anything at  Review of Systems:  Other than noted above, the patient denies any of the following symptoms. Systemic:  No fever, chills, sweats, fatigue, myalgias, headache, weight loss or anorexia. ENT:  No earache, ear congestion, nasal congestion, sneezing, rhinorrhea, sinus pressure, sinus pain, post nasal drip, or sore throat. Lungs:  No cough, sputum production, or shortness of breath. No chest pain. Skin:  No rash or itching.  PMFSH:  Past medical history, family history, social history, meds, and allergies were reviewed.  No history of allergic rhinitis.  No use of tobacco.   Physical Exam:   Vital signs:  BP 114/81  Pulse 83  Temp(Src) 99 F (37.2 C) (Oral)  Resp 23  SpO2 100% General:  Alert, in no distress. Eye:  No conjunctival injection or drainage. Lids were normal. ENT:  TMs and canals were normal, without erythema or inflammation.  Nasal mucosa was clear and uncongested, without drainage.  Mucous membranes were moist.  Pharynx was clear, without exudate or  drainage.  There were no oral ulcerations or lesions. Neck:  Supple, no adenopathy, tenderness or mass. Lungs:  No retractions or use of accessory muscles.  No respiratory distress.  Lungs were clear to auscultation, without wheezes, rales or rhonchi.  Breath sounds were clear and equal bilaterally. Heart:  Regular rhythm, without gallops, murmers or rubs. Skin:  Clear, warm, and dry, she has slight erythema and a few red spots on her right upper abdomen, no rash on her legs or back.  Results for orders placed during the hospital encounter of 05/29/13  POCT PREGNANCY, URINE      Result Value Range   Preg Test, Ur NEGATIVE  NEGATIVE   Course in Urgent Care Center:   She was given a DuoNeb breathing treatment and prednisone 60 mg by mouth. Her lungs were clear both before and after although she stated she felt better.  Assessment:  The primary encounter diagnosis was Asthma attack. A diagnosis of Contact dermatitis was also pertinent to this visit.  Her lungs sound very clear today, although she states she's had coughing, wheezing, and chest tightness. She does not have a refill for albuterol so she was given a refill for this as well as a five-day course of prednisone and triamcinolone for the mild dermatitis on the abdomen.  Plan:   1.  The following meds were prescribed:   New Prescriptions   ALBUTEROL (PROVENTIL HFA;VENTOLIN HFA) 108 (90 BASE) MCG/ACT INHALER    Inhale 1-2  puffs into the lungs every 6 (six) hours as needed for wheezing.   PREDNISONE (DELTASONE) 20 MG TABLET    Take 1 tablet (20 mg total) by mouth 2 (two) times daily.   TRIAMCINOLONE CREAM (KENALOG) 0.1 %    Apply topically 3 (three) times daily.   2.  The patient was instructed in symptomatic care and handouts were given. 3.  The patient was told to return if becoming worse in any way, if no better in 3 or 4 days, and given some red flag symptoms such as worsening of her breathing or fever that would indicate earlier  return. 4.  Follow up here if she should become worse.     Reuben Likes, MD 05/29/13 [phone]

## 2013-05-29 NOTE — ED Notes (Signed)
Late entry evaluated patient when arrived at urgent care.  Patient has had cough and asthma exacerbation over the past week.  Patient emptied inhaler yesterday.

## 2013-08-10 ENCOUNTER — Emergency Department (HOSPITAL_COMMUNITY)
Admission: EM | Admit: 2013-08-10 | Discharge: 2013-08-10 | Disposition: A | Discharge status: Home / Self Care | Payer: Self-pay | Attending: Emergency Medicine | Admitting: Emergency Medicine

## 2013-08-10 ENCOUNTER — Encounter (HOSPITAL_COMMUNITY): Payer: Self-pay | Admitting: *Deleted

## 2013-08-10 ENCOUNTER — Emergency Department (HOSPITAL_COMMUNITY): Payer: Self-pay

## 2013-08-10 DIAGNOSIS — Z79899 Other long term (current) drug therapy: Secondary | ICD-10-CM | POA: Insufficient documentation

## 2013-08-10 DIAGNOSIS — R1013 Epigastric pain: Secondary | ICD-10-CM | POA: Insufficient documentation

## 2013-08-10 DIAGNOSIS — M549 Dorsalgia, unspecified: Secondary | ICD-10-CM | POA: Insufficient documentation

## 2013-08-10 DIAGNOSIS — F17200 Nicotine dependence, unspecified, uncomplicated: Secondary | ICD-10-CM | POA: Insufficient documentation

## 2013-08-10 DIAGNOSIS — Z3202 Encounter for pregnancy test, result negative: Secondary | ICD-10-CM | POA: Insufficient documentation

## 2013-08-10 DIAGNOSIS — Z8719 Personal history of other diseases of the digestive system: Secondary | ICD-10-CM | POA: Insufficient documentation

## 2013-08-10 DIAGNOSIS — R109 Unspecified abdominal pain: Secondary | ICD-10-CM

## 2013-08-10 DIAGNOSIS — Z9889 Other specified postprocedural states: Secondary | ICD-10-CM | POA: Insufficient documentation

## 2013-08-10 DIAGNOSIS — J45909 Unspecified asthma, uncomplicated: Secondary | ICD-10-CM | POA: Insufficient documentation

## 2013-08-10 LAB — URINE MICROSCOPIC-ADD ON

## 2013-08-10 LAB — CBC WITH DIFFERENTIAL/PLATELET
Eosinophils Absolute: 0.5 10*3/uL (ref 0.0–0.7)
Eosinophils Relative: 4 % (ref 0–5)
Hemoglobin: 13.3 g/dL (ref 12.0–15.0)
Lymphs Abs: 3 10*3/uL (ref 0.7–4.0)
MCH: 28.8 pg (ref 26.0–34.0)
MCV: 84.4 fL (ref 78.0–100.0)
Monocytes Relative: 6 % (ref 3–12)
Platelets: 337 10*3/uL (ref 150–400)
RBC: 4.62 MIL/uL (ref 3.87–5.11)

## 2013-08-10 LAB — LIPASE, BLOOD: Lipase: 27 U/L (ref 11–59)

## 2013-08-10 LAB — COMPREHENSIVE METABOLIC PANEL
BUN: 10 mg/dL (ref 6–23)
Calcium: 8.9 mg/dL (ref 8.4–10.5)
Creatinine, Ser: 0.67 mg/dL (ref 0.50–1.10)
GFR calc Af Amer: >90 mL/min (ref >90)
Glucose, Bld: 107 mg/dL — ABNORMAL HIGH (ref 70–99)
Total Protein: 7.3 g/dL (ref 6.0–8.3)

## 2013-08-10 LAB — URINALYSIS, ROUTINE W REFLEX MICROSCOPIC
Bilirubin Urine: NEGATIVE
Ketones, ur: NEGATIVE mg/dL
Nitrite: NEGATIVE
pH: 6 (ref 5.0–8.0)

## 2013-08-10 MED ORDER — OMEPRAZOLE 20 MG PO CPDR: 20.0000 mg | DELAYED_RELEASE_CAPSULE | Freq: Every day | ORAL | Status: DC

## 2013-08-10 MED ORDER — GI COCKTAIL ~~LOC~~
30.0000 mL | Freq: Once | ORAL | Status: AC
  Administered 2013-08-10: 30 mL via ORAL
  Filled 2013-08-10: qty 30

## 2013-08-10 MED ORDER — OXYCODONE-ACETAMINOPHEN 5-325 MG PO TABS
1.0000 | ORAL_TABLET | Freq: Once | ORAL | Status: AC
  Administered 2013-08-10: 1 via ORAL
  Filled 2013-08-10: qty 1

## 2013-08-10 NOTE — ED Notes (Signed)
Pt states started having mid abdominal pain radiating around on both sides to mid back area, states started yesterday at 6 pm after eating [employer], pt denies n/v/d.

## 2013-08-10 NOTE — ED Provider Notes (Signed)
CSN: [id number]     Arrival date & time 08/10/13  0302 History     First MD Initiated Contact with Patient 08/10/13 0325     Chief Complaint  Patient presents with  . Abdominal Pain  . Back Pain   (Consider location/radiation/quality/duration/timing/severity/associated sxs/prior Treatment) HPI Comments: Patient presents with crampy abdominal pain to her epigastrium. She states it radiates straight through to her back. It started about 20 minutes after eating a [employer] this evening. She's had no nausea vomiting or diarrhea. She denies any fevers or chills. She denies any urinary symptoms. She denies a history of stomach problems in the past. She denies any abdominal surgeries other than a pyloric stenosis repair as an infant  Patient is a 22 y.o. female presenting with abdominal pain and back pain.  Abdominal Pain Associated symptoms: no chest pain, no chills, no cough, no diarrhea, no fatigue, no fever, no hematuria, no nausea, no shortness of breath and no vomiting   Back Pain Associated symptoms: abdominal pain   Associated symptoms: no chest pain, no fever, no headaches, no numbness and no weakness     Past Medical History  Diagnosis Date  . Asthma   . Pyloric stenosis    Past Surgical History  Procedure Laterality Date  . Pyloromyotomy     Family History  Problem Relation Age of Onset  . Arthritis Mother   . Cancer Mother     thyroid  . Depression Mother   . Hyperlipidemia Father   . Asthma Father   . Hypertension Father   . COPD Maternal Grandfather   . Diabetes Paternal Grandmother   . Arthritis Paternal Grandmother   . Asthma Paternal Grandmother   . Heart disease Paternal Grandmother   . Depression Paternal Grandmother   . Hypertension Paternal Grandmother   . Other Neg Hx    History  Substance Use Topics  . Smoking status: Current Some Day Smoker -- 0.25 packs/day    Types: Cigarettes    Last Attempt to Quit: 05/08/2012  . Smokeless tobacco: Never  Used  . Alcohol Use: No   OB History   Grav Para Term Preterm Abortions TAB SAB Ect Mult Living   1 1 1  0 0 0 0 0 0 1     Review of Systems  Constitutional: Negative for fever, chills, diaphoresis and fatigue.  HENT: Negative for congestion, rhinorrhea and sneezing.   Eyes: Negative.   Respiratory: Negative for cough, chest tightness and shortness of breath.   Cardiovascular: Negative for chest pain and leg swelling.  Gastrointestinal: Positive for abdominal pain. Negative for nausea, vomiting, diarrhea and blood in stool.  Genitourinary: Negative for frequency, hematuria, flank pain and difficulty urinating.  Musculoskeletal: Positive for back pain. Negative for arthralgias.  Skin: Negative for rash.  Neurological: Negative for dizziness, speech difficulty, weakness, numbness and headaches.    Allergies  Review of patient's allergies indicates no known allergies.  Home Medications   Current Outpatient Rx  Name  Route  Sig  Dispense  Refill  . albuterol (PROVENTIL HFA;VENTOLIN HFA) 108 (90 BASE) MCG/ACT inhaler   Inhalation   Inhale 1-2 puffs into the lungs every 6 (six) hours as needed for wheezing.   1 Inhaler   0   . omeprazole (PRILOSEC) 20 MG capsule   Oral   Take 1 capsule (20 mg total) by mouth daily.   30 capsule   0    BP 128/80  Pulse 89  Temp(Src) 99.5 F (37.5 C) (Oral)  Resp 18  Ht 5\' 1"  (1.549 m)  Wt 140 lb (63.504 kg)  BMI 26.47 kg/m2  SpO2 99%  LMP 06/27/2013 Physical Exam  Constitutional: She is oriented to person, place, and time. She appears well-developed and well-nourished.  HENT:  Head: Normocephalic and atraumatic.  Eyes: Pupils are equal, round, and reactive to light.  Neck: Normal range of motion. Neck supple.  Cardiovascular: Normal rate, regular rhythm and normal heart sounds.   Pulmonary/Chest: Effort normal and breath sounds normal. No respiratory distress. She has no wheezes. She has no rales. She exhibits no tenderness.   Abdominal: Soft. Bowel sounds are normal. There is tenderness (Mild tenderness to the epigastrium and across the upper abdomen bilaterally). There is no rebound and no guarding.  Musculoskeletal: Normal range of motion. She exhibits no edema.  Lymphadenopathy:    She has no cervical adenopathy.  Neurological: She is alert and oriented to person, place, and time.  Skin: Skin is warm and dry. No rash noted.  Psychiatric: She has a normal mood and affect.    ED Course   Procedures (including critical care time)  Results for orders placed during the hospital encounter of 08/10/13  URINALYSIS, ROUTINE W REFLEX MICROSCOPIC      Result Value Range   Color, Urine YELLOW  YELLOW   APPearance CLEAR  CLEAR   Specific Gravity, Urine 1.025  1.005 - 1.030   pH 6.0  5.0 - 8.0   Glucose, UA NEGATIVE  NEGATIVE mg/dL   Hgb urine dipstick NEGATIVE  NEGATIVE   Bilirubin Urine NEGATIVE  NEGATIVE   Ketones, ur NEGATIVE  NEGATIVE mg/dL   Protein, ur NEGATIVE  NEGATIVE mg/dL   Urobilinogen, UA 1.0  0.0 - 1.0 mg/dL   Nitrite NEGATIVE  NEGATIVE   Leukocytes, UA SMALL (*) NEGATIVE  CBC WITH DIFFERENTIAL      Result Value Range   WBC 10.6 (*) 4.0 - 10.5 K/uL   RBC 4.62  3.87 - 5.11 MIL/uL   Hemoglobin 13.3  12.0 - 15.0 g/dL   HCT 16.1  09.6 - 04.5 %   MCV 84.4  78.0 - 100.0 fL   MCH 28.8  26.0 - 34.0 pg   MCHC 34.1  30.0 - 36.0 g/dL   RDW 40.9  81.1 - 91.4 %   Platelets 337  150 - 400 K/uL   Neutrophils Relative % 61  43 - 77 %   Neutro Abs 6.5  1.7 - 7.7 K/uL   Lymphocytes Relative 28  12 - 46 %   Lymphs Abs 3.0  0.7 - 4.0 K/uL   Monocytes Relative 6  3 - 12 %   Monocytes Absolute 0.6  0.1 - 1.0 K/uL   Eosinophils Relative 4  0 - 5 %   Eosinophils Absolute 0.5  0.0 - 0.7 K/uL   Basophils Relative 1  0 - 1 %   Basophils Absolute 0.1  0.0 - 0.1 K/uL  COMPREHENSIVE METABOLIC PANEL      Result Value Range   Sodium 138  135 - 145 mEq/L   Potassium 3.4 (*) 3.5 - 5.1 mEq/L   Chloride 102  96 -  112 mEq/L   CO2 24  19 - 32 mEq/L   Glucose, Bld 107 (*) 70 - 99 mg/dL   BUN 10  6 - 23 mg/dL   Creatinine, Ser 7.82  0.50 - 1.10 mg/dL   Calcium 8.9  8.4 - 95.6 mg/dL   Total Protein 7.3  6.0 - 8.3  g/dL   Albumin 3.8  3.5 - 5.2 g/dL   AST 14  0 - 37 U/L   ALT 16  0 - 35 U/L   Alkaline Phosphatase 88  39 - 117 U/L   Total Bilirubin 0.1 (*) 0.3 - 1.2 mg/dL   GFR calc non Af Amer >90  >90 mL/min   GFR calc Af Amer >90  >90 mL/min  LIPASE, BLOOD      Result Value Range   Lipase 27  11 - 59 U/L  URINE MICROSCOPIC-ADD ON      Result Value Range   Squamous Epithelial / LPF FEW (*) RARE   WBC, UA 0-2  <3 WBC/hpf   Bacteria, UA RARE  RARE  POCT PREGNANCY, URINE      Result Value Range   Preg Test, Ur NEGATIVE  NEGATIVE   No results found.  No results found.   US Abdomen Complete  08/10/2013   *RADIOLOGY REPORT*  Clinical Data:  Upper abdominal pain.  Evaluate for gallbladder disease.  COMPLETE ABDOMINAL ULTRASOUND  Comparison:  No priors.  Findings:  Gallbladder:  No shadowing gallstones or echogenic sludge.  No gallbladder wall thickening or pericholecystic fluid.  Negative sonographic Murphy's sign according to the ultrasound technologist.  Common bile duct:  Normal caliber measuring 2.5 mm in the porta hepatis.  Liver:  No focal mass lesion seen.  Within normal limits in parenchymal echogenicity.  IVC:  Patent throughout its visualized course in the abdomen.  Pancreas:  Although the pancreas is difficult to visualize in its entirety, no focal pancreatic abnormality is identified.  Spleen:  Normal size and echotexture without focal parenchymal abnormality.  6.3 cm in length.  Right Kidney:  No hydronephrosis.  Well-preserved cortex.  Normal size and parenchymal echotexture without focal abnormalities. 8.5 cm in length.  Left Kidney:  No hydronephrosis.  Well-preserved cortex.  Normal size and parenchymal echotexture without focal abnormalities. 9.9 cm in length.  Abdominal aorta:  Measures  up to 1.2 cm in diameter proximally, tapers appropriately distally.  IMPRESSION: 1.  No acute findings in the abdomen. 2.  Specifically, no evidence of gallstones or findings to suggest acute cholecystitis at this time.   Original Report Authenticated By: Trudie Reed, M.D.   1. Abdominal pain     MDM  Patient no evidence of gallbladder disease. There is nothing suggestive of pancreatitis with a normal lipase. This likely represents a gastritis. She was discharged home in good condition with prescription for Prilosec. She was advised in a bland diet. She was given a referral to followup with GI if her symptoms do not improve.  Rolan Bucco, MD 08/10/13 [phone]

## 2013-10-18 ENCOUNTER — Emergency Department (HOSPITAL_COMMUNITY)
Admission: EM | Admit: 2013-10-18 | Discharge: 2013-10-18 | Disposition: A | Discharge status: Home / Self Care | Payer: Self-pay | Attending: Emergency Medicine | Admitting: Emergency Medicine

## 2013-10-18 ENCOUNTER — Encounter: Payer: Self-pay | Admitting: Internal Medicine

## 2013-10-18 ENCOUNTER — Encounter (HOSPITAL_COMMUNITY): Payer: Self-pay | Admitting: Emergency Medicine

## 2013-10-18 DIAGNOSIS — F17200 Nicotine dependence, unspecified, uncomplicated: Secondary | ICD-10-CM | POA: Insufficient documentation

## 2013-10-18 DIAGNOSIS — Z3202 Encounter for pregnancy test, result negative: Secondary | ICD-10-CM | POA: Insufficient documentation

## 2013-10-18 DIAGNOSIS — R1013 Epigastric pain: Secondary | ICD-10-CM

## 2013-10-18 DIAGNOSIS — J45909 Unspecified asthma, uncomplicated: Secondary | ICD-10-CM | POA: Insufficient documentation

## 2013-10-18 DIAGNOSIS — Z8719 Personal history of other diseases of the digestive system: Secondary | ICD-10-CM

## 2013-10-18 LAB — URINALYSIS, ROUTINE W REFLEX MICROSCOPIC
Bilirubin Urine: NEGATIVE
Ketones, ur: NEGATIVE mg/dL
Nitrite: NEGATIVE
Urobilinogen, UA: 0.2 mg/dL (ref 0.0–1.0)
pH: 7.5 (ref 5.0–8.0)

## 2013-10-18 MED ORDER — OMEPRAZOLE 20 MG PO CPDR: 20.0000 mg | DELAYED_RELEASE_CAPSULE | Freq: Every day | ORAL | Status: DC

## 2013-10-18 MED ORDER — FAMOTIDINE 20 MG PO TABS
40.0000 mg | ORAL_TABLET | Freq: Once | ORAL | Status: AC
  Administered 2013-10-18: 40 mg via ORAL
  Filled 2013-10-18: qty 2

## 2013-10-18 MED ORDER — GI COCKTAIL ~~LOC~~
30.0000 mL | Freq: Once | ORAL | Status: AC
  Administered 2013-10-18: 30 mL via ORAL
  Filled 2013-10-18: qty 30

## 2013-10-18 NOTE — ED Provider Notes (Signed)
Medical screening examination/treatment/procedure(s) were performed by non-physician practitioner and as supervising physician I was immediately available for consultation/collaboration.   Jadis Pitter, MD 10/18/13 0607 

## 2013-10-18 NOTE — ED Provider Notes (Signed)
CSN: [id number]     Arrival date & time 10/18/13  0008 History   First MD Initiated Contact with Patient 10/18/13 0021     Chief Complaint  Patient presents with  . Abdominal Pain   (Consider location/radiation/quality/duration/timing/severity/associated sxs/prior Treatment) Patient is a 22 y.o. female presenting with abdominal pain. The history is provided by the patient. No language interpreter was used.  Abdominal Pain Pain location:  Epigastric Associated symptoms: no chest pain, no chills, no fever, no shortness of breath and no vomiting   Associated symptoms comment:  Epigastric pain thought similar to recent diagnosis of GERD. She was seen in August for same pain that returned earlier today and was worse after eating Tacos. No vomiting, fever, bloody stools. She has not followed up with [facility] where referred on last evaluation. She is not taking any medications.   Past Medical History  Diagnosis Date  . Asthma   . Pyloric stenosis    Past Surgical History  Procedure Laterality Date  . Pyloromyotomy     Family History  Problem Relation Age of Onset  . Arthritis Mother   . Cancer Mother     thyroid  . Depression Mother   . Hyperlipidemia Father   . Asthma Father   . Hypertension Father   . COPD Maternal Grandfather   . Diabetes Paternal Grandmother   . Arthritis Paternal Grandmother   . Asthma Paternal Grandmother   . Heart disease Paternal Grandmother   . Depression Paternal Grandmother   . Hypertension Paternal Grandmother   . Other Neg Hx    History  Substance Use Topics  . Smoking status: Current Some Day Smoker -- 0.25 packs/day    Types: Cigarettes    Last Attempt to Quit: 05/08/2012  . Smokeless tobacco: Never Used  . Alcohol Use: No   OB History   Grav Para Term Preterm Abortions TAB SAB Ect Mult Living   1 1 1  0 0 0 0 0 0 1     Review of Systems  Constitutional: Negative for fever and chills.  Respiratory: Negative.  Negative for shortness  of breath.   Cardiovascular: Negative.  Negative for chest pain.  Gastrointestinal: Positive for abdominal pain. Negative for vomiting and blood in stool.  Neurological: Negative.     Allergies  Review of patient's allergies indicates no known allergies.  Home Medications  No current outpatient prescriptions on file. BP 130/71  Pulse 96  Temp(Src) 99 F (37.2 C) (Oral)  Resp 15  SpO2 97% Physical Exam  Constitutional: She appears well-developed and well-nourished.  HENT:  Head: Normocephalic.  Neck: Normal range of motion. Neck supple.  Cardiovascular: Normal rate and regular rhythm.   Pulmonary/Chest: Effort normal and breath sounds normal.  Abdominal: Soft. Bowel sounds are normal. There is tenderness. There is no rebound and no guarding.  Mild epigastric tenderness.   Musculoskeletal: Normal range of motion.  Neurological: She is alert. No cranial nerve deficit.  Skin: Skin is warm and dry. No rash noted.  Psychiatric: She has a normal mood and affect.    ED Course  Procedures (including critical care time) Labs Review Labs Reviewed  URINALYSIS, ROUTINE W REFLEX MICROSCOPIC - Abnormal; Notable for the following:    APPearance CLOUDY (*)    All other components within normal limits  POCT PREGNANCY, URINE   Imaging Review No results found.  EKG Interpretation   None       MDM  No diagnosis found. 1. GERD  She appears  comfortable, watching TV, conversing with friends. Some better with GI cocktail and Pepcid. Discussed regular medications and GI follow up for evaluation of ulcers. Reviewed patient chart showing negative Abdominal US in August of this year.    Arnoldo Hooker, PA-C 10/18/13 [phone]

## 2013-10-18 NOTE — ED Notes (Signed)
Pt c/o of abd pain, dx a couple of weeks ago with GERD, she thinks her pain is coming from Pyloric Stenosis as a child, she has been reading on Internet where others have posted similar sympotms

## 2013-11-18 ENCOUNTER — Encounter: Payer: Self-pay | Admitting: Internal Medicine

## 2013-11-22 ENCOUNTER — Ambulatory Visit: Payer: Self-pay | Admitting: Internal Medicine

## 2013-12-19 ENCOUNTER — Encounter (HOSPITAL_COMMUNITY): Payer: Self-pay | Admitting: Emergency Medicine

## 2013-12-19 ENCOUNTER — Emergency Department (HOSPITAL_COMMUNITY)
Admission: EM | Admit: 2013-12-19 | Discharge: 2013-12-19 | Disposition: A | Discharge status: Home / Self Care | Payer: Self-pay | Attending: Emergency Medicine | Admitting: Emergency Medicine

## 2013-12-19 DIAGNOSIS — R000 Tachycardia, unspecified: Secondary | ICD-10-CM | POA: Insufficient documentation

## 2013-12-19 DIAGNOSIS — J45901 Unspecified asthma with (acute) exacerbation: Secondary | ICD-10-CM | POA: Insufficient documentation

## 2013-12-19 DIAGNOSIS — J40 Bronchitis, not specified as acute or chronic: Secondary | ICD-10-CM

## 2013-12-19 DIAGNOSIS — Z79899 Other long term (current) drug therapy: Secondary | ICD-10-CM | POA: Insufficient documentation

## 2013-12-19 DIAGNOSIS — Z8719 Personal history of other diseases of the digestive system: Secondary | ICD-10-CM | POA: Insufficient documentation

## 2013-12-19 DIAGNOSIS — F17200 Nicotine dependence, unspecified, uncomplicated: Secondary | ICD-10-CM | POA: Insufficient documentation

## 2013-12-19 MED ORDER — ALBUTEROL SULFATE HFA 108 (90 BASE) MCG/ACT IN AERS: 2.0000 | INHALATION_SPRAY | Freq: Four times a day (QID) | RESPIRATORY_TRACT | Status: DC | PRN

## 2013-12-19 MED ORDER — PREDNISONE 10 MG PO TABS: 20.0000 mg | ORAL_TABLET | Freq: Every day | ORAL | Status: DC

## 2013-12-19 MED ORDER — PREDNISONE 20 MG PO TABS
60.0000 mg | ORAL_TABLET | Freq: Once | ORAL | Status: AC
  Administered 2013-12-19: 60 mg via ORAL
  Filled 2013-12-19: qty 3

## 2013-12-19 MED ORDER — ALBUTEROL SULFATE HFA 108 (90 BASE) MCG/ACT IN AERS
2.0000 | INHALATION_SPRAY | RESPIRATORY_TRACT | Status: DC
  Administered 2013-12-19: 2 via RESPIRATORY_TRACT
  Filled 2013-12-19: qty 6.7

## 2013-12-19 NOTE — ED Provider Notes (Signed)
CSN: [id number]     Arrival date & time 12/19/13  1612 History   First MD Initiated Contact with Patient 12/19/13 1620     Chief Complaint  Patient presents with  . Shortness of Breath   (Consider location/radiation/quality/duration/timing/severity/associated sxs/prior Treatment) Patient is a 22 y.o. female presenting with shortness of breath. The history is provided by the patient.  Shortness of Breath  patient complains of nonproductive cough without associated fever x3 days. History of asthma and has run out of her inhaler. Denies any pleuritic chest pain but does note some discomfort when she coughs. Cough has been nonproductive. No vomiting or diarrhea. No hemoptysis. No leg pain or recent travel. Does continue to smoke cigarettes. Denies any syncope or syncope. Symptoms are better when she goes outside at night. No treatment used prior to arrival. Past Medical History  Diagnosis Date  . Asthma   . Pyloric stenosis    Past Surgical History  Procedure Laterality Date  . Pyloromyotomy     Family History  Problem Relation Age of Onset  . Arthritis Mother   . Cancer Mother     thyroid  . Depression Mother   . Hyperlipidemia Father   . Asthma Father   . Hypertension Father   . COPD Maternal Grandfather   . Diabetes Paternal Grandmother   . Arthritis Paternal Grandmother   . Asthma Paternal Grandmother   . Heart disease Paternal Grandmother   . Depression Paternal Grandmother   . Hypertension Paternal Grandmother   . Other Neg Hx    History  Substance Use Topics  . Smoking status: Current Some Day Smoker -- 0.25 packs/day    Types: Cigarettes    Last Attempt to Quit: 05/08/2012  . Smokeless tobacco: Never Used  . Alcohol Use: No   OB History   Grav Para Term Preterm Abortions TAB SAB Ect Mult Living   1 1 1  0 0 0 0 0 0 1     Review of Systems  Respiratory: Positive for shortness of breath.   All other systems reviewed and are negative.    Allergies  Review of  patient's allergies indicates no known allergies.  Home Medications   Current Outpatient Rx  Name  Route  Sig  Dispense  Refill  . omeprazole (PRILOSEC) 20 MG capsule   Oral   Take 1 capsule (20 mg total) by mouth daily.   30 capsule   0    BP 126/74  Pulse 113  Temp(Src) 99 F (37.2 C) (Oral)  Resp 16  Wt 145 lb 11.2 oz (66.089 kg)  SpO2 96% Physical Exam  Nursing note and vitals reviewed. Constitutional: She is oriented to person, place, and time. She appears well-developed and well-nourished.  Non-toxic appearance. No distress.  HENT:  Head: Normocephalic and atraumatic.  Eyes: Conjunctivae, EOM and lids are normal. Pupils are equal, round, and reactive to light.  Neck: Normal range of motion. Neck supple. No tracheal deviation present. No mass present.  Cardiovascular: Regular rhythm and normal heart sounds.  Tachycardia present.  Exam reveals no gallop.   No murmur heard. Pulmonary/Chest: Effort normal and breath sounds normal. No stridor. No respiratory distress. She has no decreased breath sounds. She has no wheezes. She has no rhonchi. She has no rales.  Abdominal: Soft. Normal appearance and bowel sounds are normal. She exhibits no distension. There is no tenderness. There is no rebound and no CVA tenderness.  Musculoskeletal: Normal range of motion. She exhibits no edema and no  tenderness.  Neurological: She is alert and oriented to person, place, and time. She has normal strength. No cranial nerve deficit or sensory deficit. GCS eye subscore is 4. GCS verbal subscore is 5. GCS motor subscore is 6.  Skin: Skin is warm and dry. No abrasion and no rash noted.  Psychiatric: She has a normal mood and affect. Her speech is normal and behavior is normal.    ED Course  Procedures (including critical care time) Labs Review Labs Reviewed - No data to display Imaging Review No results found.  EKG Interpretation    Date/Time:  Sunday December 19 2013 16:18:45  EST Ventricular Rate:  122 PR Interval:  166 QRS Duration: 74 QT Interval:  306 QTC Calculation: 436 R Axis:   72 Text Interpretation:  Sinus tachycardia Right atrial enlargement Nonspecific T wave abnormality Abnormal ECG Confirmed by Romanda Turrubiates  MD, Ashe Graybeal ([id number]) on 12/19/2013 4:32:08 PM            MDM  No diagnosis found. Patient has good air exchange here and does not have any wheezing. She has is not tachypnic and has no signs of hypoxia. Do not think that she has a pulmonary embolism. Suspect that she has asthma exacerbation with probable bronchitis and this is the cause for her mild tachycardia. She was given prednisone here by mouth as well as an albuterol inhaler 2 go home with as well as return precautions    Toy Baker, MD 12/19/13 1635

## 2013-12-19 NOTE — ED Notes (Signed)
The pt is c/o chest pain and sob for one week.  She has a history of asthma and she thinks that is the problem.   Her pain is mid chest and she reports that it is only a 3

## 2013-12-20 ENCOUNTER — Encounter (HOSPITAL_COMMUNITY): Payer: Self-pay | Admitting: Emergency Medicine

## 2013-12-20 ENCOUNTER — Emergency Department (HOSPITAL_COMMUNITY)
Admission: EM | Admit: 2013-12-20 | Discharge: 2013-12-20 | Disposition: A | Discharge status: Home / Self Care | Payer: Self-pay | Attending: Emergency Medicine | Admitting: Emergency Medicine

## 2013-12-20 ENCOUNTER — Emergency Department (HOSPITAL_COMMUNITY): Payer: Self-pay

## 2013-12-20 DIAGNOSIS — R1110 Vomiting, unspecified: Secondary | ICD-10-CM | POA: Insufficient documentation

## 2013-12-20 DIAGNOSIS — Z8719 Personal history of other diseases of the digestive system: Secondary | ICD-10-CM | POA: Insufficient documentation

## 2013-12-20 DIAGNOSIS — R5381 Other malaise: Secondary | ICD-10-CM | POA: Insufficient documentation

## 2013-12-20 DIAGNOSIS — J189 Pneumonia, unspecified organism: Secondary | ICD-10-CM

## 2013-12-20 DIAGNOSIS — Z3202 Encounter for pregnancy test, result negative: Secondary | ICD-10-CM | POA: Insufficient documentation

## 2013-12-20 DIAGNOSIS — R630 Anorexia: Secondary | ICD-10-CM | POA: Insufficient documentation

## 2013-12-20 DIAGNOSIS — Z79899 Other long term (current) drug therapy: Secondary | ICD-10-CM | POA: Insufficient documentation

## 2013-12-20 DIAGNOSIS — J159 Unspecified bacterial pneumonia: Secondary | ICD-10-CM | POA: Insufficient documentation

## 2013-12-20 DIAGNOSIS — J45901 Unspecified asthma with (acute) exacerbation: Secondary | ICD-10-CM | POA: Insufficient documentation

## 2013-12-20 DIAGNOSIS — F17200 Nicotine dependence, unspecified, uncomplicated: Secondary | ICD-10-CM | POA: Insufficient documentation

## 2013-12-20 DIAGNOSIS — IMO0001 Reserved for inherently not codable concepts without codable children: Secondary | ICD-10-CM | POA: Insufficient documentation

## 2013-12-20 LAB — BASIC METABOLIC PANEL
CO2: 24 mEq/L (ref 19–32)
Calcium: 9.1 mg/dL (ref 8.4–10.5)
Creatinine, Ser: 0.68 mg/dL (ref 0.50–1.10)
GFR calc non Af Amer: >90 mL/min (ref >90)
Glucose, Bld: 100 mg/dL — ABNORMAL HIGH (ref 70–99)
Potassium: 3.4 mEq/L — ABNORMAL LOW (ref 3.5–5.1)
Sodium: 142 mEq/L (ref 135–145)

## 2013-12-20 LAB — POCT I-STAT TROPONIN I: Troponin i, poc: 0 ng/mL (ref 0.00–0.08)

## 2013-12-20 LAB — URINALYSIS, ROUTINE W REFLEX MICROSCOPIC
Bilirubin Urine: NEGATIVE
Hgb urine dipstick: NEGATIVE
Ketones, ur: NEGATIVE mg/dL
Leukocytes, UA: NEGATIVE
Nitrite: NEGATIVE
Specific Gravity, Urine: 1.028 (ref 1.005–1.030)
Urobilinogen, UA: 0.2 mg/dL (ref 0.0–1.0)
pH: 5.5 (ref 5.0–8.0)

## 2013-12-20 LAB — CBC
Hemoglobin: 13.7 g/dL (ref 12.0–15.0)
MCH: 29.4 pg (ref 26.0–34.0)
MCHC: 33.6 g/dL (ref 30.0–36.0)
MCV: 87.6 fL (ref 78.0–100.0)
Platelets: 325 10*3/uL (ref 150–400)
RDW: 13.5 % (ref 11.5–15.5)

## 2013-12-20 LAB — POCT PREGNANCY, URINE: Preg Test, Ur: NEGATIVE

## 2013-12-20 MED ORDER — KETOROLAC TROMETHAMINE 30 MG/ML IJ SOLN
30.0000 mg | Freq: Once | INTRAMUSCULAR | Status: AC
  Administered 2013-12-20: 30 mg via INTRAVENOUS
  Filled 2013-12-20: qty 1

## 2013-12-20 MED ORDER — SODIUM CHLORIDE 0.9 % IV BOLUS (SEPSIS)
1000.0000 mL | Freq: Once | INTRAVENOUS | Status: AC
  Administered 2013-12-20: 1000 mL via INTRAVENOUS

## 2013-12-20 MED ORDER — ACETAMINOPHEN 325 MG PO TABS
650.0000 mg | ORAL_TABLET | Freq: Once | ORAL | Status: AC
  Administered 2013-12-20: 650 mg via ORAL

## 2013-12-20 MED ORDER — AZITHROMYCIN 250 MG PO TABS: ORAL_TABLET | ORAL | Status: DC

## 2013-12-20 MED ORDER — LEVALBUTEROL HCL 1.25 MG/3ML IN NEBU
1.2500 mg | INHALATION_SOLUTION | Freq: Once | RESPIRATORY_TRACT | Status: AC
  Administered 2013-12-20: 1.25 mg via RESPIRATORY_TRACT
  Filled 2013-12-20: qty 3

## 2013-12-20 MED ORDER — ALBUTEROL SULFATE HFA 108 (90 BASE) MCG/ACT IN AERS
6.0000 | INHALATION_SPRAY | Freq: Once | RESPIRATORY_TRACT | Status: AC
  Administered 2013-12-20: 6 via RESPIRATORY_TRACT
  Filled 2013-12-20: qty 6.7

## 2013-12-20 NOTE — ED Notes (Signed)
Pt is still receiving fluid bolus. Will d/c once finished.

## 2013-12-20 NOTE — ED Notes (Signed)
Pt c/o CP tightness and SOB x 3 days; pt sts recent hx of URI sx and has hx of asthma

## 2013-12-20 NOTE — ED Provider Notes (Signed)
CSN: [id number]     Arrival date & time 12/20/13  4098 History   First MD Initiated Contact with Patient 12/20/13 2013     Chief Complaint  Patient presents with  . Shortness of Breath   (Consider location/radiation/quality/duration/timing/severity/associated sxs/prior Treatment) Patient is a 22 y.o. female presenting with pneumonia and general illness. The history is provided by the patient. No language interpreter was used.  Pneumonia This is a new problem. The current episode started more than 2 days ago. The problem occurs constantly. The problem has been gradually worsening. Associated symptoms include chest pain and shortness of breath. Pertinent negatives include no abdominal pain and no headaches. The symptoms are aggravated by coughing. Nothing relieves the symptoms. She has tried acetaminophen and rest (prednisone) for the symptoms. The treatment provided no relief.  Illness Location:  Generalized Severity:  Moderate Onset quality:  Gradual Duration:  3 days Timing:  Constant Progression:  Worsening Chronicity:  New Associated symptoms: chest pain, congestion, cough, fatigue, fever, myalgias, rhinorrhea, shortness of breath, sore throat, vomiting and wheezing   Associated symptoms: no abdominal pain, no diarrhea, no headaches, no nausea and no rash   Chest pain:    Quality:  Aching   Severity:  Moderate   Timing:  Constant   Progression:  Unchanged   Chronicity:  New Congestion:    Location:  Chest   Interferes with sleep: no     Interferes with eating/drinking: no   Cough:    Cough characteristics:  Productive   Sputum characteristics:  Nondescript   Severity:  Moderate   Onset quality:  Gradual   Duration:  1 day   Timing:  Constant   Progression:  Worsening Fever:    Duration:  1 day   Timing:  Intermittent   Temp source:  Subjective Myalgias:    Location:  Generalized Shortness of breath:    Severity:  Moderate   Onset quality:  Gradual   Duration:  1  day   Timing:  Constant   Progression:  Worsening Sore throat:    Severity:  Mild   Onset quality:  Gradual   Duration:  1 day   Timing:  Constant   Progression:  Unchanged Vomiting:    Quality:  Stomach contents   Severity:  Mild   Duration:  2 days   Timing:  Intermittent   Progression:  Unchanged Wheezing:    Severity:  Moderate   Onset quality:  Gradual   Duration:  3 days   Timing:  Intermittent   Progression:  Waxing and waning   Chronicity:  New Risk factors:  Asthma   Past Medical History  Diagnosis Date  . Asthma   . Pyloric stenosis    Past Surgical History  Procedure Laterality Date  . Pyloromyotomy     Family History  Problem Relation Age of Onset  . Arthritis Mother   . Cancer Mother     thyroid  . Depression Mother   . Hyperlipidemia Father   . Asthma Father   . Hypertension Father   . COPD Maternal Grandfather   . Diabetes Paternal Grandmother   . Arthritis Paternal Grandmother   . Asthma Paternal Grandmother   . Heart disease Paternal Grandmother   . Depression Paternal Grandmother   . Hypertension Paternal Grandmother   . Other Neg Hx    History  Substance Use Topics  . Smoking status: Current Some Day Smoker -- 0.25 packs/day    Types: Cigarettes    Last Attempt  to Quit: 05/08/2012  . Smokeless tobacco: Never Used  . Alcohol Use: No   OB History   Grav Para Term Preterm Abortions TAB SAB Ect Mult Living   1 1 1  0 0 0 0 0 0 1     Review of Systems  Constitutional: Positive for fever, appetite change and fatigue. Negative for chills, diaphoresis and activity change.  HENT: Positive for congestion, rhinorrhea and sore throat. Negative for facial swelling.   Eyes: Negative for photophobia and discharge.  Respiratory: Positive for cough, shortness of breath and wheezing. Negative for chest tightness.   Cardiovascular: Positive for chest pain. Negative for palpitations and leg swelling.  Gastrointestinal: Positive for vomiting.  Negative for nausea, abdominal pain and diarrhea.  Endocrine: Negative for polydipsia and polyuria.  Genitourinary: Negative for dysuria, frequency, difficulty urinating and pelvic pain.  Musculoskeletal: Positive for myalgias. Negative for arthralgias, back pain, neck pain and neck stiffness.  Skin: Negative for color change, rash and wound.  Allergic/Immunologic: Negative for immunocompromised state.  Neurological: Negative for facial asymmetry, weakness, numbness and headaches.  Hematological: Does not bruise/bleed easily.  Psychiatric/Behavioral: Negative for confusion and agitation.    Allergies  Review of patient's allergies indicates no known allergies.  Home Medications   Current Outpatient Rx  Name  Route  Sig  Dispense  Refill  . albuterol (PROVENTIL HFA;VENTOLIN HFA) 108 (90 BASE) MCG/ACT inhaler   Inhalation   Inhale 2 puffs into the lungs every 6 (six) hours as needed for wheezing or shortness of breath.   1 Inhaler   2   . omeprazole (PRILOSEC) 20 MG capsule   Oral   Take 1 capsule (20 mg total) by mouth daily.   30 capsule   0   . azithromycin (ZITHROMAX Z-PAK) 250 MG tablet      2 po day one, then 1 daily x 4 days   5 tablet   0    BP 110/75  Pulse 110  Temp(Src) 98.7 F (37.1 C) (Oral)  Resp 17  Wt 146 lb 9.6 oz (66.497 kg)  SpO2 98% Physical Exam  Constitutional: She is oriented to person, place, and time. She appears well-developed and well-nourished. No distress.  HENT:  Head: Normocephalic and atraumatic.  Mouth/Throat: No oropharyngeal exudate.  Eyes: Pupils are equal, round, and reactive to light.  Neck: Normal range of motion. Neck supple.  Cardiovascular: Normal rate, regular rhythm and normal heart sounds.  Exam reveals no gallop and no friction rub.   No murmur heard. Pulmonary/Chest: Tachypnea noted. No respiratory distress. She has wheezes in the right upper field, the right middle field, the right lower field, the left upper field,  the left middle field and the left lower field. She has no rales.  Abdominal: Soft. Bowel sounds are normal. She exhibits no distension and no mass. There is no tenderness. There is no rebound and no guarding.  Musculoskeletal: Normal range of motion. She exhibits no edema and no tenderness.  Neurological: She is alert and oriented to person, place, and time.  Skin: Skin is warm and dry.  Psychiatric: She has a normal mood and affect.    ED Course  Procedures (including critical care time) Labs Review Labs Reviewed  BASIC METABOLIC PANEL - Abnormal; Notable for the following:    Potassium 3.4 (*)    Glucose, Bld 100 (*)    All other components within normal limits  CBC  URINALYSIS, ROUTINE W REFLEX MICROSCOPIC  POCT I-STAT TROPONIN I  POCT PREGNANCY,  URINE   Imaging Review Dg Chest 2 View  12/20/2013   CLINICAL DATA:  Shortness of breath  EXAM: CHEST  2 VIEW  COMPARISON:  07/06/2009  FINDINGS: Cardiac shadow is within normal limits. Increased density is noted along the right mediastinal border in the upper lobe consistent with infiltrate and some consolidation. Volume loss in the right upper lobe is noted. No other focal infiltrate is seen. No acute bony abnormality is noted.  IMPRESSION: Changes consistent with a right upper lobe infiltrate and volume loss. Followup films following appropriate therapy are recommended. If the changes persist, a CT of the chest is recommended.   Electronically Signed   By: Alcide Clever M.D.   On: 12/20/2013 19:57    EKG Interpretation    Date/Time:  Monday December 20 2013 18:43:36 EST Ventricular Rate:  127 PR Interval:  160 QRS Duration: 74 QT Interval:  290 QTC Calculation: 421 R Axis:   70 Text Interpretation:  Sinus tachycardia Biatrial enlargement Nonspecific T wave abnormality Abnormal ECG Confirmed by DOCHERTY  MD, MEGAN ([id number]) on 12/20/2013 8:20:10 PM            MDM   1. CAP (community acquired pneumonia)    Pt is a 22 y.o.  female with Pmhx as above who presents with about 3 days of cough, SOB, now this morning with central chest pain, fever/chills.  On PE, pt febrile, tachycardic, but in NAD.  Wheezing heard throughout. Pt reports not taking much PO.  CXR w/ RUL opacity.  Will treat w/ albuterol, 1L NS, toradol.  Will start z-pak for CAP.  Pt encouraged to continued albuterol Q4hrs at home.   Return precautions given for new or worsening symptoms including worsening SOB, pain, inability to tolerate PO.          Shanna Cisco, MD 12/21/13 1124

## 2013-12-30 NOTE — L&D Delivery Note (Signed)
Delivery Note At 1:06 AM a viable female was delivered via Vaginal, Spontaneous Delivery (Presentation: Right Occiput Anterior).  APGAR: 9, 9; weight  .   Placenta status: Intact, Spontaneous.  Cord: 3 vessels with the following complications: None.  Cord pH: none  Anesthesia: Epidural  Episiotomy: None Lacerations: None Suture Repair: none Est. Blood Loss (mL): 350  Mom to postpartum.  Baby to Couplet care / Skin to Skin.  HARPER,CHARLES A 12/10/2014, 2:04 AM

## 2014-02-10 ENCOUNTER — Encounter: Payer: Self-pay | Admitting: Obstetrics

## 2014-02-10 ENCOUNTER — Ambulatory Visit (INDEPENDENT_AMBULATORY_CARE_PROVIDER_SITE_OTHER): Payer: Self-pay | Admitting: Obstetrics

## 2014-02-10 DIAGNOSIS — Z3046 Encounter for surveillance of implantable subdermal contraceptive: Secondary | ICD-10-CM

## 2014-02-10 DIAGNOSIS — Z0000 Encounter for general adult medical examination without abnormal findings: Secondary | ICD-10-CM

## 2014-02-10 DIAGNOSIS — Z3169 Encounter for other general counseling and advice on procreation: Secondary | ICD-10-CM

## 2014-02-10 MED ORDER — PNV PRENATAL PLUS MULTIVITAMIN 27-1 MG PO TABS: 1.0000 | ORAL_TABLET | Freq: Every day | ORAL | Status: DC

## 2014-02-10 NOTE — Progress Notes (Signed)
Pt is in office today for Nexplanon removal. NEXPLANON REMOVAL NOTE  Date of LMP:   unknown  Contraception used: *Nexplanon   Indications:  The patient desires removal of Nexplanon.  She understands risks, benefits, and alternatives to Implanon and would like to proceed.  Anesthesia:   Lidocaine 1% plain.  Procedure:  A time-out was performed confirming the procedure and the patient's allergy status.  Complications: None                      The rod was palpated and the area was sterilely prepped.  The area beneath the distal tip was anesthetized with 1% xylocaine and the skin incised                       Over the tip and the tip was exposed, grasped with forcep and removed intact.  A single suture of 4-0 Vicryl was used to close incision.  Steri strip                       And a bandage applied and the arm was wrapped with gauze bandage.  The patient tolerated well.  Instructions:  The patient was instructed to remove the dressing in 24 hours and that some bruising is to be expected.  She was advised to use over the counter analgesics as needed for any pain at the site.  She is to keep the area dry for 24 hours and to call if her hand or arm becomes cold, numb, or blue.  Return visit:  Return in 2 weeks

## 2014-02-24 ENCOUNTER — Ambulatory Visit: Payer: Self-pay | Admitting: Obstetrics

## 2014-03-03 ENCOUNTER — Ambulatory Visit: Payer: Self-pay | Admitting: Obstetrics

## 2014-03-04 ENCOUNTER — Emergency Department (HOSPITAL_COMMUNITY)
Admission: EM | Admit: 2014-03-04 | Discharge: 2014-03-04 | Disposition: A | Discharge status: Home / Self Care | Payer: Self-pay | Attending: Emergency Medicine | Admitting: Emergency Medicine

## 2014-03-04 ENCOUNTER — Encounter (HOSPITAL_COMMUNITY): Payer: Self-pay | Admitting: Emergency Medicine

## 2014-03-04 DIAGNOSIS — J45909 Unspecified asthma, uncomplicated: Secondary | ICD-10-CM | POA: Insufficient documentation

## 2014-03-04 DIAGNOSIS — F17200 Nicotine dependence, unspecified, uncomplicated: Secondary | ICD-10-CM | POA: Insufficient documentation

## 2014-03-04 DIAGNOSIS — Z8719 Personal history of other diseases of the digestive system: Secondary | ICD-10-CM | POA: Insufficient documentation

## 2014-03-04 DIAGNOSIS — M722 Plantar fascial fibromatosis: Secondary | ICD-10-CM | POA: Insufficient documentation

## 2014-03-04 MED ORDER — NAPROXEN 500 MG PO TABS: 500.0000 mg | ORAL_TABLET | Freq: Two times a day (BID) | ORAL | Status: DC

## 2014-03-04 NOTE — Discharge Instructions (Signed)
Emergency Department Resource Guide 1) Find a Doctor and Pay Out of Pocket Although you won't have to find out who is covered by your insurance plan, it is a good idea to ask around and get recommendations. You will then need to call the office and see if the doctor you have chosen will accept you as a new patient and what types of options they offer for patients who are self-pay. Some doctors offer discounts or will set up payment plans for their patients who do not have insurance, but you will need to ask so you aren't surprised when you get to your appointment.  2) Contact Your Local Health Department Not all health departments have doctors that can see patients for sick visits, but many do, so it is worth a call to see if yours does. If you don't know where your local health department is, you can check in your phone book. The CDC also has a tool to help you locate your state's health department, and many state websites also have listings of all of their local health departments.  3) Find a [facility] If your illness is not likely to be very severe or complicated, you may want to try a walk in clinic. These are popping up all over the country in pharmacies, drugstores, and shopping centers. They're usually staffed by nurse practitioners or physician assistants that have been trained to treat common illnesses and complaints. They're usually fairly quick and inexpensive. However, if you have serious medical issues or chronic medical problems, these are probably not your best option.  No Primary Care Doctor: - Call Health Connect at  [phone] - they can help you locate a primary care doctor that  accepts your insurance, provides certain services, etc. - Physician Referral Service- [phone]  Chronic Pain Problems: Organization         Address  Phone   Notes  [facility]  [phone] Patients need to be referred by their primary care doctor.   Medication  Assistance: Organization         Address  Phone   Notes  [facility] Medication [facility] [address] [place], [place] [address] [phone] --Must be a resident of [facility] -- Must have NO insurance coverage whatsoever (no Medicaid/ Medicare, etc.) -- The pt. MUST have a primary care doctor that directs their care regularly and follows them in the community   [facility]  [phone]   [employer]  [phone]    Agencies that provide inexpensive medical care: Organization         Address                                                       Phone                                                                            Notes  [place]  [phone]   [facility]    [phone])  [facility] [place], [place] [address] [phone]   [place]. [address], [place] [phone]   [facility]    [phone]   [facility]    [phone]   [place] and [place] [address], [place] Phone:  [phone], Fax:  [phone] Hours of Operation:  9 am - 6 pm, M-F.  Also accepts Medicaid/Medicare and self-pay.  [facility] for [address] [place], [address], [place] Phone: [phone], Fax: [phone]. Hours of Operation:  8:30 am - 5:30 pm, M-F.  Also accepts Medicaid and self-pay.  [facility] [address], [place] Phone: [phone]   [place], [place], [place] [phone], Ext. [phone] Mondays & Thursdays: 7-9 AM.  First 15 patients are seen on a first come, first serve basis.    [place] Providers:  Organization         Address                                                                       Phone                               Notes  [place] [facility] [address], [place] [phone] Also accepts self-pay patients.  [facility] [address], [place]  [phone]   [place], [address], [place] [phone]   [facility] [address] [place] [phone]   Lucianne Lei [address], [place]   [phone] Only accepts [place] Access [place] patients after they have their name applied to their card.   Self-Pay (no insurance) in [facility]:   Organization         Address                                                     Phone               Notes  Sickle Cell Patients, [facility] [place] [phone]   [facility] [place] [phone]   [facility] [place]  [place], [place], [place] [phone]   [facility]/Dr. Osei-Bonsu  [address] [place] or [address], [place] [phone] Phone number for both [place] and [place] locations is the same.  [facility] and [facility] [address] [place] [phone]   [facility] [address] [place] or [address] [phone]   [facility] [place], [place] [phone], phone; [phone], fax Sees patients 1st and 3rd Saturday of  every month.  Must not qualify for public or private insurance (i.e. Medicaid, Medicare, [place] Health Choice, Veterans' Benefits)  Household income should be no more than 200% of the poverty level The clinic cannot treat you if you are pregnant or think you are pregnant  Sexually transmitted diseases are not treated at the clinic.    Dental Care: Organization         Address                                  Phone                       Notes  [facility] Department of [facility] [place] [phone] Accepts children up to age 9 who are enrolled in [place] or [place]; pregnant women with a Medicaid card; and children who have applied for Medicaid or [place] Health Choice, but were declined, whose parents can pay a reduced fee at time of service.  [facility] Department of [facility]  [address], [place] [phone] Accepts children up to age 24 who are enrolled in [place] or [place]; pregnant women with a Medicaid card; and children who have applied for Medicaid or [place] Health Choice, but were declined, whose parents can pay a reduced fee at time of service.  [place] PRO[facility]  [place] [phone] Patients are seen by appointment only. Walk-ins are not accepted. [place] will see patients 45 years of age and older. Monday - Tuesday (8am-5pm) Most Wednesdays (8:30-5pm) $30 per visit, cash only  [facility] Adult Dental Access PROGRAM  [address], [facility] [phone] Patients are seen by appointment only. Walk-ins are not accepted. [place] will see patients 27 years of age and older. One Wednesday Evening (Monthly: Volunteer Based).  $30 per visit, cash only  [place]  [phone] for adults; Children under age 15, call Graduate Pediatric Dentistry at [phone]. Children aged 75-14, please call [phone] to request a pediatric application.  Dental services are provided in all areas of dental care including fillings, crowns and bridges, complete and partial dentures, implants, gum treatment, root canals, and extractions. Preventive care is also provided. Treatment is provided to both adults and children. Patients are selected via a lottery and there is often a waiting list.   [facility] [address], [place]  [phone] [email or web address]   [facility] [address] [place], [place] [phone], Ext. [phone] Second and Fourth Thursday of each month, opens at 6:30 AM; Clinic ends at 9 AM.  Patients are seen on a first-come first-served basis, and a limited number are seen during each clinic.   [facility]  [address] [place], [place] [phone]   Eligibility Requirements You must have lived in [place], [place], or [place] counties for at least the last three months.   You cannot be eligible for state or federal sponsored [employer], including [employer], [place], or [employer].   You generally cannot be eligible for healthcare insurance through your employer.    How to apply: Eligibility screenings are held every Tuesday and Wednesday afternoon from 1:00 pm until 4:00 pm. You do not need an appointment for the interview!  [facility] [address], [place], [place] [phone]   °[facility]  [phone]   °[facility]  [phone]   °[facility]  [phone]   ° °

## 2014-03-04 NOTE — ED Provider Notes (Signed)
CSN: [id number]     Arrival date & time 03/04/14  1806 History  This chart was scribed for Noland Fordyce, [place]. working with Mervin Kung, MD, by Elby Beck ED Scribe. This patient was seen in room TR06C/TR06C and the patient's care was started at 8:03 PM.   Chief Complaint  Patient presents with  . Foot Pain    The history is provided by the patient. No language interpreter was used.    HPI Comments: Peggy [name] is a 23 y.o. female who presents to the Emergency Department complaining of gradually worsening right foot pain onset 2 days ago. She states that the pain feels like "tightness", which is worsened and becomes "sharp" with movement or walking. She reports that the pain began on the plantar aspect of the foot, and it now includes the middle of the foot. She reports that she is on her feet a lot at work and that she wears tennis shoes. She denies any injury to have onset her pain. She denies any history of foot surgery and denies any history of gout. Has not tried any pain medication PTA.    Past Medical History  Diagnosis Date  . Asthma   . Pyloric stenosis    Past Surgical History  Procedure Laterality Date  . Pyloromyotomy     Family History  Problem Relation Age of Onset  . Arthritis Mother   . Cancer Mother     thyroid  . Depression Mother   . Hyperlipidemia Father   . Asthma Father   . Hypertension Father   . COPD Maternal Grandfather   . Diabetes Paternal Grandmother   . Arthritis Paternal Grandmother   . Asthma Paternal Grandmother   . Heart disease Paternal Grandmother   . Depression Paternal Grandmother   . Hypertension Paternal Grandmother   . Other Neg Hx    History  Substance Use Topics  . Smoking status: Current Some Day Smoker -- 0.25 packs/day    Types: Cigarettes    Last Attempt to Quit: 05/08/2012  . Smokeless tobacco: Never Used  . Alcohol Use: No   OB History   Grav Para Term Preterm Abortions TAB SAB Ect Mult Living   1 1 1  0  0 0 0 0 0 1     Review of Systems  Musculoskeletal:       Right foot pain.  All other systems reviewed and are negative.   Allergies  Review of patient's allergies indicates no known allergies.  Home Medications   Current Outpatient Rx  Name  Route  Sig  Dispense  Refill  . naproxen (NAPROSYN) 500 MG tablet   Oral   Take 1 tablet (500 mg total) by mouth 2 (two) times daily.   30 tablet   0     Triage Vitals: BP 157/76  Pulse 87  Temp(Src) 99.1 F (37.3 C) (Oral)  Resp 20  SpO2 98%  Physical Exam  Nursing note and vitals reviewed. Constitutional: She is oriented to person, place, and time. She appears well-developed and well-nourished.  HENT:  Head: Normocephalic and atraumatic.  Eyes: EOM are normal.  Neck: Normal range of motion.  Cardiovascular: Normal rate.   Pulmonary/Chest: Effort normal.  Musculoskeletal: Normal range of motion. She exhibits tenderness. She exhibits no edema.  Right foot- Full ROM. No deformity or edema. Skin intact. No erythema or ecchymosis. Tenderness under arch of right foot. Mild tenderness to heel. Sensation to light touch intact. Pedal pulse 2+. 5/5 plantar flexion  and dorsiflexion.  Neurological: She is alert and oriented to person, place, and time.  Skin: Skin is warm and dry.  Psychiatric: She has a normal mood and affect. Her behavior is normal.    ED Course  Procedures (including critical care time)  DIAGNOSTIC STUDIES: Oxygen Saturation is 98% on RA, normal by my interpretation.    COORDINATION OF CARE: 8:06 PM- Discussed clinical suspicion that pt has plantar fascitis. Pt advised of plan for treatment and pt agrees.  Labs Review Labs Reviewed - No data to display Imaging Review No results found.   EKG Interpretation None      MDM   Final diagnoses:  Plantar fasciitis of right foot    Pt presenting w/ right foot pain, characteristic of plantar fascitis. No hx of injury. No evidence of cellulitis.   Do not  believe imaging needed at this time. Not concerned for emergent process taking place. Will tx symptomatically as needed for pain. Discussed home tx with stretching. Education packet provided. Rx: naproxen. Advised to f/u with PCP. Return precautions provided. Pt verbalized understanding and agreement with tx plan.    I personally performed the services described in this documentation, which was scribed in my presence. The recorded information has been reviewed and is accurate.   Noland Fordyce, PA-C 03/04/14 2108

## 2014-03-04 NOTE — ED Notes (Signed)
The pt has had rt foot pain for 3 days.  No known injury.  Hx of the same

## 2014-03-05 NOTE — ED Provider Notes (Signed)
Medical screening examination/treatment/procedure(s) were performed by non-physician practitioner and as supervising physician I was immediately available for consultation/collaboration.   EKG Interpretation None        Mervin Kung, MD 03/05/14 [phone]

## 2014-03-19 ENCOUNTER — Inpatient Hospital Stay (HOSPITAL_COMMUNITY)
Admission: AD | Admit: 2014-03-19 | Discharge: 2014-03-19 | Disposition: A | Discharge status: Home / Self Care | Payer: Self-pay | Source: Ambulatory Visit | Attending: Obstetrics & Gynecology | Admitting: Obstetrics & Gynecology

## 2014-03-19 DIAGNOSIS — R300 Dysuria: Secondary | ICD-10-CM | POA: Insufficient documentation

## 2014-03-19 DIAGNOSIS — K311 Adult hypertrophic pyloric stenosis: Secondary | ICD-10-CM | POA: Insufficient documentation

## 2014-03-19 DIAGNOSIS — F17200 Nicotine dependence, unspecified, uncomplicated: Secondary | ICD-10-CM | POA: Insufficient documentation

## 2014-03-19 DIAGNOSIS — J45909 Unspecified asthma, uncomplicated: Secondary | ICD-10-CM | POA: Insufficient documentation

## 2014-03-19 DIAGNOSIS — R109 Unspecified abdominal pain: Secondary | ICD-10-CM | POA: Insufficient documentation

## 2014-03-19 LAB — URINE MICROSCOPIC-ADD ON: RBC / HPF: NONE SEEN RBC/hpf (ref <3)

## 2014-03-19 LAB — URINALYSIS, ROUTINE W REFLEX MICROSCOPIC
BILIRUBIN URINE: NEGATIVE
Glucose, UA: NEGATIVE mg/dL
Hgb urine dipstick: NEGATIVE
KETONES UR: NEGATIVE mg/dL
NITRITE: NEGATIVE
Protein, ur: NEGATIVE mg/dL
Specific Gravity, Urine: 1.01 (ref 1.005–1.030)
Urobilinogen, UA: 0.2 mg/dL (ref 0.0–1.0)
pH: 7 (ref 5.0–8.0)

## 2014-03-19 LAB — WET PREP, GENITAL
Trich, Wet Prep: NONE SEEN
Yeast Wet Prep HPF POC: NONE SEEN

## 2014-03-19 LAB — POCT PREGNANCY, URINE: Preg Test, Ur: NEGATIVE

## 2014-03-19 NOTE — MAU Note (Signed)
Pt states here for abd pain. Feels pain when bladder is full. Was on birth control pills, however thinks she may be pregnant.

## 2014-03-19 NOTE — MAU Provider Note (Signed)
History     CSN: [id number]  Arrival date and time: 03/19/14 1408   First Provider Initiated Contact with Patient 03/19/14 1444      Chief Complaint  Patient presents with  . Abdominal Pain   Abdominal Pain    Peggy [name] is a 23 y.o. G1P1001 who presents today with abdominal pain. She states that she has had pain with intercourse for over a month, but about 2 days ago she started to have lower abdominal pain and pain with urination. She is unsure if she is pregnant. She had the nexplanon taken out about 6 weeks ago.   Past Medical History  Diagnosis Date  . Asthma   . Pyloric stenosis     Past Surgical History  Procedure Laterality Date  . Pyloromyotomy      Family History  Problem Relation Age of Onset  . Arthritis Mother   . Cancer Mother     thyroid  . Depression Mother   . Hyperlipidemia Father   . Asthma Father   . Hypertension Father   . COPD Maternal Grandfather   . Diabetes Paternal Grandmother   . Arthritis Paternal Grandmother   . Asthma Paternal Grandmother   . Heart disease Paternal Grandmother   . Depression Paternal Grandmother   . Hypertension Paternal Grandmother   . Other Neg Hx     History  Substance Use Topics  . Smoking status: Current Some Day Smoker -- 0.25 packs/day    Types: Cigarettes    Last Attempt to Quit: 05/08/2012  . Smokeless tobacco: Never Used  . Alcohol Use: No    Allergies: No Known Allergies  Prescriptions prior to admission  Medication Sig Dispense Refill  . albuterol (PROVENTIL HFA;VENTOLIN HFA) 108 (90 BASE) MCG/ACT inhaler Inhale 2 puffs into the lungs every 6 (six) hours as needed (asthma).      . naproxen (NAPROSYN) 500 MG tablet Take 1 tablet (500 mg total) by mouth 2 (two) times daily.  30 tablet  0    Review of Systems  Gastrointestinal: Positive for abdominal pain.   Physical Exam   Blood pressure 133/79, pulse 101, temperature 97.9 F (36.6 C), temperature source Oral, resp. rate 16,  height 5\' 4"  (1.626 m), weight 68.72 kg (151 lb 8 oz).  Physical Exam  Nursing note and vitals reviewed. Constitutional: She is oriented to person, place, and time. She appears well-developed and well-nourished. No distress.  Cardiovascular: Normal rate.   Respiratory: Effort normal.  GI: Soft. There is no tenderness.  Genitourinary:   External: no lesion Vagina: small amount of white discharge Cervix: pink, smooth, no CMT Uterus: NSSC Adnexa: NT   Neurological: She is alert and oriented to person, place, and time.  Skin: Skin is warm and dry.  Psychiatric: She has a normal mood and affect.    MAU Course  Procedures  Results for orders placed during the hospital encounter of 03/19/14 (from the past 24 hour(s))  URINALYSIS, ROUTINE W REFLEX MICROSCOPIC     Status: Abnormal   Collection Time    03/19/14  2:49 PM      Result Value Ref Range   Color, Urine YELLOW  YELLOW   APPearance CLEAR  CLEAR   Specific Gravity, Urine 1.010  1.005 - 1.030   pH 7.0  5.0 - 8.0   Glucose, UA NEGATIVE  NEGATIVE mg/dL   Hgb urine dipstick NEGATIVE  NEGATIVE   Bilirubin Urine NEGATIVE  NEGATIVE   Ketones, ur NEGATIVE  NEGATIVE mg/dL  Protein, ur NEGATIVE  NEGATIVE mg/dL   Urobilinogen, UA 0.2  0.0 - 1.0 mg/dL   Nitrite NEGATIVE  NEGATIVE   Leukocytes, UA SMALL (*) NEGATIVE  POCT PREGNANCY, URINE     Status: None   Collection Time    03/19/14  2:49 PM      Result Value Ref Range   Preg Test, Ur NEGATIVE  NEGATIVE  URINE MICROSCOPIC-ADD ON     Status: Abnormal   Collection Time    03/19/14  2:49 PM      Result Value Ref Range   Squamous Epithelial / LPF FEW (*) RARE   WBC, UA 3-6  <3 WBC/hpf   RBC / HPF    <3 RBC/hpf   Value: NO FORMED ELEMENTS SEEN ON URINE MICROSCOPIC EXAMINATION   Bacteria, UA FEW (*) RARE  WET PREP, GENITAL     Status: Abnormal   Collection Time    03/19/14  2:51 PM      Result Value Ref Range   Yeast Wet Prep HPF POC NONE SEEN  NONE SEEN   Trich, Wet Prep  NONE SEEN  NONE SEEN   Clue Cells Wet Prep HPF POC FEW (*) NONE SEEN   WBC, Wet Prep HPF POC MODERATE (*) NONE SEEN     Assessment and Plan   1. Abdominal pain    Urine culture pending Return to MAU as needed  Follow-up Information   Schedule an appointment as soon as possible for a visit with HARPER,CHARLES A, MD.   Specialty:  Obstetrics and Gynecology   Contact information:   [place] [address] [phone]        Mathis Bud 03/19/2014, 2:57 PM

## 2014-03-19 NOTE — MAU Note (Signed)
23 yo, G1P1 presents with c/o sharp pelvic pain and painful intercourse with spotting. Denies changes in vaginal discharge. LMP unknown; Nexplanon taken out 6-8 weeks ago.

## 2014-03-20 LAB — URINE CULTURE: Colony Count: 1000

## 2014-03-21 LAB — GC/CHLAMYDIA PROBE AMP
CT Probe RNA: NEGATIVE
GC Probe RNA: NEGATIVE

## 2014-04-05 ENCOUNTER — Inpatient Hospital Stay (HOSPITAL_COMMUNITY)
Admission: AD | Admit: 2014-04-05 | Discharge: 2014-04-05 | Discharge status: Against Medical Advice | Payer: Self-pay | Source: Ambulatory Visit | Attending: Family Medicine | Admitting: Family Medicine

## 2014-04-05 NOTE — MAU Note (Signed)
Pt called, not in lobby 

## 2014-04-05 NOTE — MAU Note (Signed)
Pt called, not in lobby.  Another pt states that [place] had to leave, staff was not informed.

## 2014-04-13 ENCOUNTER — Encounter (HOSPITAL_COMMUNITY): Payer: Self-pay | Admitting: *Deleted

## 2014-04-13 ENCOUNTER — Inpatient Hospital Stay (HOSPITAL_COMMUNITY): Payer: Medicaid Other

## 2014-04-13 ENCOUNTER — Inpatient Hospital Stay (HOSPITAL_COMMUNITY)
Admission: AD | Admit: 2014-04-13 | Discharge: 2014-04-13 | Disposition: A | Discharge status: Home / Self Care | Payer: Medicaid Other | Source: Ambulatory Visit | Attending: Obstetrics & Gynecology | Admitting: Obstetrics & Gynecology

## 2014-04-13 DIAGNOSIS — R109 Unspecified abdominal pain: Secondary | ICD-10-CM

## 2014-04-13 DIAGNOSIS — J45909 Unspecified asthma, uncomplicated: Secondary | ICD-10-CM | POA: Insufficient documentation

## 2014-04-13 DIAGNOSIS — O9989 Other specified diseases and conditions complicating pregnancy, childbirth and the puerperium: Principal | ICD-10-CM

## 2014-04-13 DIAGNOSIS — O99891 Other specified diseases and conditions complicating pregnancy: Secondary | ICD-10-CM | POA: Insufficient documentation

## 2014-04-13 DIAGNOSIS — O99330 Smoking (tobacco) complicating pregnancy, unspecified trimester: Secondary | ICD-10-CM | POA: Insufficient documentation

## 2014-04-13 DIAGNOSIS — O26899 Other specified pregnancy related conditions, unspecified trimester: Secondary | ICD-10-CM

## 2014-04-13 LAB — URINALYSIS, ROUTINE W REFLEX MICROSCOPIC
Bilirubin Urine: NEGATIVE
GLUCOSE, UA: NEGATIVE mg/dL
Hgb urine dipstick: NEGATIVE
Ketones, ur: NEGATIVE mg/dL
Nitrite: NEGATIVE
PROTEIN: NEGATIVE mg/dL
SPECIFIC GRAVITY, URINE: 1.025 (ref 1.005–1.030)
Urobilinogen, UA: 0.2 mg/dL (ref 0.0–1.0)
pH: 6 (ref 5.0–8.0)

## 2014-04-13 LAB — WET PREP, GENITAL
Trich, Wet Prep: NONE SEEN
YEAST WET PREP: NONE SEEN

## 2014-04-13 LAB — CBC
HCT: 38.4 % (ref 36.0–46.0)
Hemoglobin: 12.8 g/dL (ref 12.0–15.0)
MCH: 29.2 pg (ref 26.0–34.0)
MCHC: 33.3 g/dL (ref 30.0–36.0)
MCV: 87.5 fL (ref 78.0–100.0)
Platelets: 348 10*3/uL (ref 150–400)
RBC: 4.39 MIL/uL (ref 3.87–5.11)
RDW: 13.4 % (ref 11.5–15.5)
WBC: 12.8 10*3/uL — ABNORMAL HIGH (ref 4.0–10.5)

## 2014-04-13 LAB — POCT PREGNANCY, URINE: PREG TEST UR: POSITIVE — AB

## 2014-04-13 LAB — URINE MICROSCOPIC-ADD ON

## 2014-04-13 LAB — HCG, QUANTITATIVE, PREGNANCY: HCG, BETA CHAIN, QUANT, S: 1839 m[IU]/mL — AB (ref <5)

## 2014-04-13 MED ORDER — ACETAMINOPHEN 325 MG PO TABS
650.0000 mg | ORAL_TABLET | Freq: Once | ORAL | Status: AC
  Administered 2014-04-13: 650 mg via ORAL
  Filled 2014-04-13: qty 2

## 2014-04-13 NOTE — MAU Provider Note (Signed)
History     CSN: [id number]  Arrival date and time: 04/13/14 1532   First Provider Initiated Contact with Patient 04/13/14 1638      Chief Complaint  Patient presents with  . Pelvic Pain  . Possible Pregnancy   HPI Comments: Peggy [name] 23 y.o. G2P1001 presents to MAU with pelvic pain ongoing for 3 weeks. She was last seen 3 weeks ago with abdominal pain and was not pregnant. Her pain is "6" on 1-10 scale and she has taken no medications. Her blood type is O+    Pelvic Pain The patient's primary symptoms include pelvic pain. Associated symptoms include abdominal pain.  Possible Pregnancy Associated symptoms include abdominal pain.      Past Medical History  Diagnosis Date  . Asthma   . Pyloric stenosis     Past Surgical History  Procedure Laterality Date  . Pyloromyotomy      Family History  Problem Relation Age of Onset  . Arthritis Mother   . Cancer Mother     thyroid  . Depression Mother   . Hyperlipidemia Father   . Asthma Father   . Hypertension Father   . COPD Maternal Grandfather   . Diabetes Paternal Grandmother   . Arthritis Paternal Grandmother   . Asthma Paternal Grandmother   . Heart disease Paternal Grandmother   . Depression Paternal Grandmother   . Hypertension Paternal Grandmother   . Other Neg Hx     History  Substance Use Topics  . Smoking status: Current Some Day Smoker -- 0.25 packs/day    Types: Cigarettes    Last Attempt to Quit: 05/08/2012  . Smokeless tobacco: Never Used  . Alcohol Use: No    Allergies: No Known Allergies  Prescriptions prior to admission  Medication Sig Dispense Refill  . albuterol (PROVENTIL HFA;VENTOLIN HFA) 108 (90 BASE) MCG/ACT inhaler Inhale 2 puffs into the lungs every 6 (six) hours as needed (asthma).        Review of Systems  Constitutional: Negative.   HENT: Negative.   Eyes: Negative.   Respiratory: Negative.   Cardiovascular: Negative.   Gastrointestinal: Positive for abdominal  pain.  Genitourinary: Positive for pelvic pain.       Spotting few times  Musculoskeletal: Negative.   Skin: Negative.   Neurological: Negative.   Psychiatric/Behavioral: Negative.    Physical Exam   Blood pressure 114/80, pulse 102, temperature 99.5 F (37.5 C), temperature source Oral, resp. rate 18, height 5' (1.524 m), weight 71.215 kg (157 lb), last menstrual period 06/27/2013.  Physical Exam  Constitutional: She is oriented to person, place, and time. She appears well-developed and well-nourished. No distress.  HENT:  Head: Normocephalic and atraumatic.  Eyes: Pupils are equal, round, and reactive to light.  GI: Soft. Bowel sounds are normal. She exhibits no distension and no mass. There is no tenderness. There is no rebound and no guarding.  Genitourinary:  Genital:External Vaginal:thin white discharge/ no blood Cervix:closed/ thick Bimanual:nontender   Musculoskeletal: Normal range of motion.  Neurological: She is alert and oriented to person, place, and time.  Skin: Skin is warm and dry.  Psychiatric: She has a normal mood and affect. Her behavior is normal. Judgment and thought content normal.   Results for orders placed during the hospital encounter of 04/13/14 (from the past 24 hour(s))  URINALYSIS, ROUTINE W REFLEX MICROSCOPIC     Status: Abnormal   Collection Time    04/13/14  4:00 PM      Result  Value Ref Range   Color, Urine YELLOW  YELLOW   APPearance CLEAR  CLEAR   Specific Gravity, Urine 1.025  1.005 - 1.030   pH 6.0  5.0 - 8.0   Glucose, UA NEGATIVE  NEGATIVE mg/dL   Hgb urine dipstick NEGATIVE  NEGATIVE   Bilirubin Urine NEGATIVE  NEGATIVE   Ketones, ur NEGATIVE  NEGATIVE mg/dL   Protein, ur NEGATIVE  NEGATIVE mg/dL   Urobilinogen, UA 0.2  0.0 - 1.0 mg/dL   Nitrite NEGATIVE  NEGATIVE   Leukocytes, UA TRACE (*) NEGATIVE  URINE MICROSCOPIC-ADD ON     Status: Abnormal   Collection Time    04/13/14  4:00 PM      Result Value Ref Range   Squamous  Epithelial / LPF MANY (*) RARE   WBC, UA 0-2  <3 WBC/hpf  POCT PREGNANCY, URINE     Status: Abnormal   Collection Time    04/13/14  4:04 PM      Result Value Ref Range   Preg Test, Ur POSITIVE (*) NEGATIVE  WET PREP, GENITAL     Status: Abnormal   Collection Time    04/13/14  4:45 PM      Result Value Ref Range   Yeast Wet Prep HPF POC NONE SEEN  NONE SEEN   Trich, Wet Prep NONE SEEN  NONE SEEN   Clue Cells Wet Prep HPF POC FEW (*) NONE SEEN   WBC, Wet Prep HPF POC FEW (*) NONE SEEN  CBC     Status: Abnormal   Collection Time    04/13/14  5:15 PM      Result Value Ref Range   WBC 12.8 (*) 4.0 - 10.5 K/uL   RBC 4.39  3.87 - 5.11 MIL/uL   Hemoglobin 12.8  12.0 - 15.0 g/dL   HCT 38.4  36.0 - 46.0 %   MCV 87.5  78.0 - 100.0 fL   MCH 29.2  26.0 - 34.0 pg   MCHC 33.3  30.0 - 36.0 g/dL   RDW 13.4  11.5 - 15.5 %   Platelets 348  150 - 400 K/uL  HCG, QUANTITATIVE, PREGNANCY     Status: Abnormal   Collection Time    04/13/14  5:15 PM      Result Value Ref Range   hCG, Beta Chain, Quant, S 1839 (*) <5 mIU/mL   US Ob Comp Less 14 Wks  04/13/2014   CLINICAL DATA:  Early pregnancy.  Left lower quadrant pain.  EXAM: OBSTETRIC <14 WK [name] AND TRANSVAGINAL OB US  TECHNIQUE: Both transabdominal and transvaginal ultrasound examinations were performed for complete evaluation of the gestation as well as the maternal uterus, adnexal regions, and pelvic cul-de-sac. Transvaginal technique was performed to assess early pregnancy.  COMPARISON:  None.  FINDINGS: Intrauterine gestational sac: There is a small rounded fluid collection within the endometrial cavity measuring 3.7 mm in diameter that could represent an early gestational sac.  Yolk sac:  Not visible  Embryo:  Not visible  MSD:  3.7  mm   4 w   6  d  [name] EDC: 12/15/2014  Maternal uterus/adnexae: There is probably some hemorrhage within the endometrial canal. Left ovary is normal measuring 2.4 x 1 point for by 1.3 cm. Right ovary is normal measuring  2.4 x 2.5 by 2.3 cm. No free fluid.  IMPRESSION: Intrauterine fluid collection most consistent with an early gestational sac, 4 weeks 6 days by mean sac diameter. No visible yolk sac  or fetal pole at this time. The differential diagnosis is pseudo sac. There is no evidence of adnexal mass or free fluid.   Electronically Signed   By: Nelson Chimes M.D.   On: 04/13/2014 18:55   US Ob Transvaginal  04/13/2014   CLINICAL DATA:  Early pregnancy.  Left lower quadrant pain.  EXAM: OBSTETRIC <14 WK [name] AND TRANSVAGINAL OB US  TECHNIQUE: Both transabdominal and transvaginal ultrasound examinations were performed for complete evaluation of the gestation as well as the maternal uterus, adnexal regions, and pelvic cul-de-sac. Transvaginal technique was performed to assess early pregnancy.  COMPARISON:  None.  FINDINGS: Intrauterine gestational sac: There is a small rounded fluid collection within the endometrial cavity measuring 3.7 mm in diameter that could represent an early gestational sac.  Yolk sac:  Not visible  Embryo:  Not visible  MSD:  3.7  mm   4 w   6  d  [name] EDC: 12/15/2014  Maternal uterus/adnexae: There is probably some hemorrhage within the endometrial canal. Left ovary is normal measuring 2.4 x 1 point for by 1.3 cm. Right ovary is normal measuring 2.4 x 2.5 by 2.3 cm. No free fluid.  IMPRESSION: Intrauterine fluid collection most consistent with an early gestational sac, 4 weeks 6 days by mean sac diameter. No visible yolk sac or fetal pole at this time. The differential diagnosis is pseudo sac. There is no evidence of adnexal mass or free fluid.   Electronically Signed   By: Nelson Chimes M.D.   On: 04/13/2014 18:55      MAU Course  Procedures  MDM Wet prep, GC, Chlamydia, CBC, UA, U/S, Quant Tylenol for pain/ she is driving  Assessment and Plan   A: Abdominal pain in Early Pregnancy Follow up BHCG in 48 hours Establish prenatal care and take [place] 04/13/2014, 6:35 PM

## 2014-04-13 NOTE — MAU Note (Addendum)
Been having pains down in pelvic area past 2 wks, pain comes and goes.   Unsure of LMP, has had spotting off and on last couple wks.  Has been real dizzy past couple wks.  +test yesterday

## 2014-04-13 NOTE — Discharge Instructions (Signed)
Abdominal Pain During Pregnancy Abdominal pain is common in pregnancy. Most of the time, it does not cause harm. There are many causes of abdominal pain. Some causes are more serious than others. Some of the causes of abdominal pain in pregnancy are easily diagnosed. Occasionally, the diagnosis takes time to understand. Other times, the cause is not determined. Abdominal pain can be a sign that something is very wrong with the pregnancy, or the pain may have nothing to do with the pregnancy at all. For this reason, always tell your health care provider if you have any abdominal discomfort. HOME CARE INSTRUCTIONS  Monitor your abdominal pain for any changes. The following actions may help to alleviate any discomfort you are experiencing:  Do not have sexual intercourse or put anything in your vagina until your symptoms go away completely.  Get plenty of rest until your pain improves.  Drink clear fluids if you feel nauseous. Avoid solid food as long as you are uncomfortable or nauseous.  Only take over-the-counter or prescription medicine as directed by your health care provider.  Keep all follow-up appointments with your health care provider. SEEK IMMEDIATE MEDICAL CARE IF:  You are bleeding, leaking fluid, or passing tissue from the vagina.  You have increasing pain or cramping.  You have persistent vomiting.  You have painful or bloody urination.  You have a fever.  You notice a decrease in your baby's movements.  You have extreme weakness or feel faint.  You have shortness of breath, with or without abdominal pain.  You develop a severe headache with abdominal pain.  You have abnormal vaginal discharge with abdominal pain.  You have persistent diarrhea.  You have abdominal pain that continues even after rest, or gets worse. MAKE SURE YOU:   Understand these instructions.  Will watch your condition.  Will get help right away if you are not doing well or get  worse. Document Released: 12/16/2005 Document Revised: 10/06/2013 Document Reviewed: 07/15/2013 [facility] Patient Information 2014 [place], [place]. Threatened Miscarriage  A threatened miscarriage is a pregnancy that may end. It may be marked by bleeding during the first 20 weeks of pregnancy. Often, the pregnancy can continue without any more problems. You may be asked to stop:  Having sex (intercourse).  Having orgasms.  Using tampons.  Exercising.  Doing heavy physical activity and work. HOME CARE   Your doctor may tell you to take bed rest and to stop activities and work.  Write down the number of pads you use each day. Write down how often you change pads. Write down how soaked they are.  Follow your doctor's advice for follow-up visits and tests.  If your blood type is Rh-negative and the father's blood is Rh-positive (or is not known), you may get a shot to protect the baby.  If you have a miscarriage, save all the tissue you pass in a container. Take the container to your doctor. GET HELP RIGHT AWAY IF:   You have bad cramps or pain in your belly (abdomen), lower belly, or back.  You have a fever or chills.  Your bleeding gets worse or you pass large clots of blood or tissue. Save this tissue to show your doctor.  You feel lightheaded, weak, dizzy, or pass out (faint).  You have a gush of fluid from your vagina. MAKE SURE YOU:   Understand these instructions.  Will watch your condition.  Will get help right away if you are not doing well or get worse. Document  Released: 11/28/2008 Document Revised: 03/09/2012 Document Reviewed: 01/01/2010 [facility] Patient Information 2014 [place], [place].

## 2014-04-14 LAB — HIV ANTIBODY (ROUTINE TESTING W REFLEX): HIV: NONREACTIVE

## 2014-04-14 LAB — GC/CHLAMYDIA PROBE AMP
CT PROBE, AMP APTIMA: NEGATIVE
GC Probe RNA: NEGATIVE

## 2014-04-16 ENCOUNTER — Encounter (HOSPITAL_COMMUNITY): Payer: Self-pay

## 2014-04-16 ENCOUNTER — Inpatient Hospital Stay (HOSPITAL_COMMUNITY)
Admission: AD | Admit: 2014-04-16 | Discharge: 2014-04-16 | Disposition: A | Discharge status: Home / Self Care | Payer: Medicaid Other | Source: Ambulatory Visit | Attending: Obstetrics & Gynecology | Admitting: Obstetrics & Gynecology

## 2014-04-16 DIAGNOSIS — Z09 Encounter for follow-up examination after completed treatment for conditions other than malignant neoplasm: Secondary | ICD-10-CM | POA: Insufficient documentation

## 2014-04-16 DIAGNOSIS — O3680X0 Pregnancy with inconclusive fetal viability, not applicable or unspecified: Secondary | ICD-10-CM

## 2014-04-16 DIAGNOSIS — O9989 Other specified diseases and conditions complicating pregnancy, childbirth and the puerperium: Secondary | ICD-10-CM

## 2014-04-16 DIAGNOSIS — O99891 Other specified diseases and conditions complicating pregnancy: Secondary | ICD-10-CM | POA: Insufficient documentation

## 2014-04-16 DIAGNOSIS — R1032 Left lower quadrant pain: Secondary | ICD-10-CM | POA: Insufficient documentation

## 2014-04-16 LAB — HCG, QUANTITATIVE, PREGNANCY: hCG, Beta Chain, Quant, S: 5101 m[IU]/mL — ABNORMAL HIGH (ref <5)

## 2014-04-16 NOTE — MAU Provider Note (Signed)
Attestation of Attending Supervision of Advanced Practitioner (CNM/NP): Evaluation and management procedures were performed by the Advanced Practitioner under my supervision and collaboration. I have reviewed the Advanced Practitioner's note and chart, and I agree with the management and plan.  Fredderick Phenix Anjalina Bergevin 4:20 PM

## 2014-04-16 NOTE — MAU Provider Note (Signed)
HPI:  [name] is a 23 y.o. female G2P1001 at Unknown gestation who presents for repeat beta hcg. She was seen on 4/15 for pelvic pain and was found to have a positive pregnancy test. Currently she denies pain or bleeding.    Objective:  GENERAL: Well-developed, well-nourished female in no acute distress.  HEENT: Normocephalic, atraumatic.   LUNGS: Effort normal HEART: Regular rate  SKIN: Warm, dry and without erythema PSYCH: Normal mood and affect  Filed Vitals:   04/16/14 1241  BP: 110/60  Pulse: 93  Temp: 98.5 F (36.9 C)  Resp: 18   US Ob Transvaginal   04/13/2014 CLINICAL DATA: Early pregnancy. Left lower quadrant pain. EXAM: OBSTETRIC <14 WK [name] AND TRANSVAGINAL OB US TECHNIQUE: Both transabdominal and transvaginal ultrasound examinations were performed for complete evaluation of the gestation as well as the maternal uterus, adnexal regions, and pelvic cul-de-sac. Transvaginal technique was performed to assess early pregnancy. COMPARISON: None. FINDINGS: Intrauterine gestational sac: There is a small rounded fluid collection within the endometrial cavity measuring 3.7 mm in diameter that could represent an early gestational sac. Yolk sac: Not visible Embryo: Not visible MSD: 3.7 mm 4 w 6 d [name] EDC: 12/15/2014 Maternal uterus/adnexae: There is probably some hemorrhage within the endometrial canal. Left ovary is normal measuring 2.4 x 1 point for by 1.3 cm. Right ovary is normal measuring 2.4 x 2.5 by 2.3 cm. No free fluid. IMPRESSION: Intrauterine fluid collection most consistent with an early gestational sac, 4 weeks 6 days by mean sac diameter. No visible yolk sac or fetal pole at this time. The differential diagnosis is pseudo sac. There is no evidence of adnexal mass or free fluid. Electronically Signed By: Nelson Chimes M.D. On: 04/13/2014 18:55   MDM: Beta Hcg 4/15: 1839 Beta Hcg 4/18: 5101   Assessment:  Intrauterine fluid collection; no yolk sac, cannot exclude  ectopic pregnancy Appropriate rise in Beta Hcg level  Plan:  Discharge home in stable condition Return to MAU as needed, if symptoms worsen Ectopic precautions Repeat US in 7 days  [place], NP 04/16/2014. 1:04 PM

## 2014-04-16 NOTE — MAU Note (Signed)
Pt here for repeat BHCG, denies pain or bleeding.  

## 2014-04-21 ENCOUNTER — Encounter (HOSPITAL_COMMUNITY): Payer: Self-pay | Admitting: *Deleted

## 2014-04-21 ENCOUNTER — Inpatient Hospital Stay (HOSPITAL_COMMUNITY): Payer: Medicaid Other

## 2014-04-21 ENCOUNTER — Inpatient Hospital Stay (HOSPITAL_COMMUNITY)
Admission: AD | Admit: 2014-04-21 | Discharge: 2014-04-21 | Disposition: A | Discharge status: Home / Self Care | Payer: Medicaid Other | Source: Ambulatory Visit | Attending: Obstetrics & Gynecology | Admitting: Obstetrics & Gynecology

## 2014-04-21 DIAGNOSIS — N949 Unspecified condition associated with female genital organs and menstrual cycle: Secondary | ICD-10-CM

## 2014-04-21 DIAGNOSIS — O26899 Other specified pregnancy related conditions, unspecified trimester: Secondary | ICD-10-CM

## 2014-04-21 DIAGNOSIS — O99330 Smoking (tobacco) complicating pregnancy, unspecified trimester: Secondary | ICD-10-CM | POA: Insufficient documentation

## 2014-04-21 DIAGNOSIS — R109 Unspecified abdominal pain: Secondary | ICD-10-CM | POA: Insufficient documentation

## 2014-04-21 DIAGNOSIS — K311 Adult hypertrophic pyloric stenosis: Secondary | ICD-10-CM | POA: Insufficient documentation

## 2014-04-21 DIAGNOSIS — O210 Mild hyperemesis gravidarum: Secondary | ICD-10-CM | POA: Insufficient documentation

## 2014-04-21 DIAGNOSIS — J45909 Unspecified asthma, uncomplicated: Secondary | ICD-10-CM | POA: Insufficient documentation

## 2014-04-21 DIAGNOSIS — R42 Dizziness and giddiness: Secondary | ICD-10-CM | POA: Insufficient documentation

## 2014-04-21 DIAGNOSIS — O219 Vomiting of pregnancy, unspecified: Secondary | ICD-10-CM

## 2014-04-21 DIAGNOSIS — R102 Pelvic and perineal pain: Secondary | ICD-10-CM

## 2014-04-21 LAB — URINALYSIS, ROUTINE W REFLEX MICROSCOPIC
Bilirubin Urine: NEGATIVE
Glucose, UA: NEGATIVE mg/dL
Hgb urine dipstick: NEGATIVE
Ketones, ur: 15 mg/dL — AB
NITRITE: NEGATIVE
Protein, ur: NEGATIVE mg/dL
Specific Gravity, Urine: >1.03 — ABNORMAL HIGH (ref 1.005–1.030)
Urobilinogen, UA: 0.2 mg/dL (ref 0.0–1.0)
pH: 6 (ref 5.0–8.0)

## 2014-04-21 LAB — URINE MICROSCOPIC-ADD ON

## 2014-04-21 NOTE — MAU Provider Note (Signed)
History     CSN: [id number]  Arrival date and time: 04/21/14 1306   None     Chief Complaint  Patient presents with  . Abdominal Cramping  . Morning Sickness   Abdominal Cramping    Peggy [name] is a 23 y.o. G2P1001 at [redacted]w[redacted]d who presents today with cramping, nausea and dizziness. She denies any bleeding or vomiting at this time. She has not used anything for the nausea at this time.   Past Medical History  Diagnosis Date  . Asthma   . Pyloric stenosis     Past Surgical History  Procedure Laterality Date  . Pyloromyotomy      Family History  Problem Relation Age of Onset  . Arthritis Mother   . Cancer Mother     thyroid  . Depression Mother   . Hyperlipidemia Father   . Asthma Father   . Hypertension Father   . COPD Maternal Grandfather   . Diabetes Paternal Grandmother   . Arthritis Paternal Grandmother   . Asthma Paternal Grandmother   . Heart disease Paternal Grandmother   . Depression Paternal Grandmother   . Hypertension Paternal Grandmother   . Other Neg Hx     History  Substance Use Topics  . Smoking status: Current Some Day Smoker -- 0.25 packs/day    Types: Cigarettes    Last Attempt to Quit: 05/08/2012  . Smokeless tobacco: Never Used  . Alcohol Use: No    Allergies: No Known Allergies  Prescriptions prior to admission  Medication Sig Dispense Refill  . albuterol (PROVENTIL HFA;VENTOLIN HFA) 108 (90 BASE) MCG/ACT inhaler Inhale 2 puffs into the lungs every 6 (six) hours as needed (asthma).        ROS Physical Exam   Blood pressure 138/91, pulse 106, temperature 98.9 F (37.2 C), temperature source Oral, resp. rate 18, height 5' (1.524 m), weight 71.668 kg (158 lb), last menstrual period 06/27/2013, not currently breastfeeding.  Physical Exam  Nursing note and vitals reviewed. Constitutional: She is oriented to person, place, and time. She appears well-developed and well-nourished. No distress.  Cardiovascular: Normal rate.    Respiratory: Effort normal.  GI: Soft. There is no tenderness.  Neurological: She is alert and oriented to person, place, and time.  Skin: Skin is warm and dry.  Psychiatric: She has a normal mood and affect.    MAU Course  Procedures  US Ob Transvaginal  04/21/2014   CLINICAL DATA:  Confirm viability. Persistent cramping. First-trimester pregnancy.  EXAM: TRANSVAGINAL OB ULTRASOUND  TECHNIQUE: Transvaginal ultrasound was performed for complete evaluation of the gestation as well as the maternal uterus, adnexal regions, and pelvic cul-de-sac.  COMPARISON:  First-trimester ultrasound 04/13/2014.  FINDINGS: Intrauterine gestational sac: Single.  Normal in size.  Yolk sac:  Present  Embryo:  Present  Cardiac Activity: Present  Heart Rate: 101 bpm  CRL:   4.2  mm   6 w 1 d                  [name] EDC: 12/14/2014  Maternal uterus/adnexae: A corpus luteal cyst is evident in the right adnexa. A small subchorionic hemorrhage is suggested. Trace free fluid is within normal limits.  IMPRESSION: 1. A single intrauterine pregnancy is identified with a confirmed heartbeat of 101 beats per min. 2. Small subchorionic hemorrhage.   Electronically Signed   By: Lawrence Santiago M.D.   On: 04/21/2014 14:19     Assessment and Plan   1. Pelvic pain complicating  pregnancy   2. Nausea/vomiting in pregnancy    Start [facility] as soon as possible First trimester precautions Return to MAU as needed  Unisom (doxylamine succinate 25 mg tablets) Take one tablet daily at bedtime. If symptoms are not adequately controlled, the dose can be increased to a maximum recommended dose of two tablets daily (1/2 tablet in the morning, 1/2 tablet mid-afternoon and one at bedtime).  Vitamin B6 100mg  tablets. Take one tablet twice a day (up to 200 mg per day).  Follow-up Information   Schedule an appointment as soon as possible for a visit with [facility] [place].   Contact information:   [place]  [phone]        Heather Donovan Hogan 04/21/2014, 2:23 PM

## 2014-04-21 NOTE — Discharge Instructions (Signed)
Nausea medication to take during pregnancy:   Unisom (doxylamine succinate 25 mg tablets) Take one tablet daily at bedtime. If symptoms are not adequately controlled, the dose can be increased to a maximum recommended dose of two tablets daily (1/2 tablet in the morning, 1/2 tablet mid-afternoon and one at bedtime).  Vitamin B6 100mg  tablets. Take one tablet twice a day (up to 200 mg per day).  Morning Sickness Morning sickness is when you feel sick to your stomach (nauseous) during pregnancy. This nauseous feeling may or may not come with vomiting. It often occurs in the morning but can be a problem any time of day. Morning sickness is most common during the first trimester, but it may continue throughout pregnancy. While morning sickness is unpleasant, it is usually harmless unless you develop severe and continual vomiting (hyperemesis gravidarum). This condition requires more intense treatment.  CAUSES  The cause of morning sickness is not completely known but seems to be related to normal hormonal changes that occur in pregnancy. RISK FACTORS You are at greater risk if you:  Experienced nausea or vomiting before your pregnancy.  Had morning sickness during a previous pregnancy.  Are pregnant with more than one baby, such as twins. TREATMENT  Do not use any medicines (prescription, over-the-counter, or herbal) for morning sickness without first talking to your health care provider. Your health care provider may prescribe or recommend:  Vitamin B6 supplements.  Anti-nausea medicines.  The herbal medicine ginger. HOME CARE INSTRUCTIONS   Only take over-the-counter or prescription medicines as directed by your health care provider.  Taking multivitamins before getting pregnant can prevent or decrease the severity of morning sickness in most women.   Eat a piece of dry toast or unsalted crackers before getting out of bed in the morning.   Eat five or six small meals a day.   Eat  dry and bland foods (rice, baked potato). Foods high in carbohydrates are often helpful.  Do not drink liquids with your meals. Drink liquids between meals.   Avoid greasy, fatty, and spicy foods.   Get someone to cook for you if the smell of any food causes nausea and vomiting.   If you feel nauseous after taking prenatal vitamins, take the vitamins at night or with a snack.  Snack on protein foods (nuts, yogurt, cheese) between meals if you are hungry.   Eat unsweetened gelatins for desserts.   Wearing an acupressure wristband (worn for sea sickness) may be helpful.   Acupuncture may be helpful.   Do not smoke.   Get a humidifier to keep the air in your house free of odors.   Get plenty of fresh air. SEEK MEDICAL CARE IF:   Your home remedies are not working, and you need medicine.  You feel dizzy or lightheaded.  You are losing weight. SEEK IMMEDIATE MEDICAL CARE IF:   You have persistent and uncontrolled nausea and vomiting.  You pass out (faint). Document Released: 02/06/2007 Document Revised: 08/18/2013 Document Reviewed: 06/02/2013 [facility] Patient Information 2014 [place].

## 2014-04-21 NOTE — MAU Note (Signed)
Pt reports having abd cramping and sharp pains on and off for a few weeks. C/o nausea that is getting worse.

## 2014-04-25 ENCOUNTER — Ambulatory Visit (HOSPITAL_COMMUNITY)
Admission: RE | Admit: 2014-04-25 | Discharge: 2014-04-25 | Disposition: A | Discharge status: Home / Self Care | Payer: Medicaid Other | Source: Ambulatory Visit | Attending: Obstetrics and Gynecology | Admitting: Obstetrics and Gynecology

## 2014-04-25 DIAGNOSIS — O3680X0 Pregnancy with inconclusive fetal viability, not applicable or unspecified: Secondary | ICD-10-CM

## 2014-05-03 LAB — OB RESULTS CONSOLE RPR: RPR: NONREACTIVE

## 2014-05-03 LAB — OB RESULTS CONSOLE RUBELLA ANTIBODY, IGM: Rubella: NON-IMMUNE/NOT IMMUNE

## 2014-05-03 LAB — OB RESULTS CONSOLE ABO/RH: "RH Type ": POSITIVE

## 2014-05-03 LAB — OB RESULTS CONSOLE HEPATITIS B SURFACE ANTIGEN: Hepatitis B Surface Ag: NEGATIVE

## 2014-05-03 LAB — OB RESULTS CONSOLE ANTIBODY SCREEN: ANTIBODY SCREEN: NEGATIVE

## 2014-06-26 ENCOUNTER — Inpatient Hospital Stay (HOSPITAL_COMMUNITY)
Admission: AD | Admit: 2014-06-26 | Discharge: 2014-06-26 | Disposition: A | Discharge status: Home / Self Care | Payer: Medicaid Other | Source: Ambulatory Visit | Attending: Obstetrics and Gynecology | Admitting: Obstetrics and Gynecology

## 2014-06-26 ENCOUNTER — Encounter (HOSPITAL_COMMUNITY): Payer: Self-pay | Admitting: *Deleted

## 2014-06-26 DIAGNOSIS — R109 Unspecified abdominal pain: Secondary | ICD-10-CM | POA: Insufficient documentation

## 2014-06-26 DIAGNOSIS — M549 Dorsalgia, unspecified: Secondary | ICD-10-CM | POA: Insufficient documentation

## 2014-06-26 DIAGNOSIS — O99891 Other specified diseases and conditions complicating pregnancy: Secondary | ICD-10-CM | POA: Insufficient documentation

## 2014-06-26 DIAGNOSIS — IMO0002 Reserved for concepts with insufficient information to code with codable children: Secondary | ICD-10-CM | POA: Insufficient documentation

## 2014-06-26 DIAGNOSIS — O9989 Other specified diseases and conditions complicating pregnancy, childbirth and the puerperium: Secondary | ICD-10-CM

## 2014-06-26 DIAGNOSIS — R21 Rash and other nonspecific skin eruption: Secondary | ICD-10-CM | POA: Insufficient documentation

## 2014-06-26 LAB — URINALYSIS, ROUTINE W REFLEX MICROSCOPIC
Bilirubin Urine: NEGATIVE
Glucose, UA: NEGATIVE mg/dL
HGB URINE DIPSTICK: NEGATIVE
Ketones, ur: 15 mg/dL — AB
Leukocytes, UA: NEGATIVE
Nitrite: NEGATIVE
PROTEIN: NEGATIVE mg/dL
Specific Gravity, Urine: 1.025 (ref 1.005–1.030)
Urobilinogen, UA: 1 mg/dL (ref 0.0–1.0)
pH: 6 (ref 5.0–8.0)

## 2014-06-26 LAB — WET PREP, GENITAL
Clue Cells Wet Prep HPF POC: NONE SEEN
Trich, Wet Prep: NONE SEEN
Yeast Wet Prep HPF POC: NONE SEEN

## 2014-06-26 MED ORDER — IBUPROFEN 600 MG PO TABS: 600.0000 mg | ORAL_TABLET | Freq: Four times a day (QID) | ORAL | Status: DC | PRN

## 2014-06-26 MED ORDER — IBUPROFEN 600 MG PO TABS
600.0000 mg | ORAL_TABLET | Freq: Once | ORAL | Status: AC
  Administered 2014-06-26: 600 mg via ORAL
  Filled 2014-06-26: qty 1

## 2014-06-26 NOTE — MAU Note (Signed)
Pt reports right hand swollen since this am, also has a rash on her arms. Also reports cramping and back pain off/on for a few weeks.

## 2014-06-26 NOTE — MAU Provider Note (Signed)
History     CSN: [id number]  Arrival date and time: 06/26/14 2129   None     Chief Complaint  Patient presents with  . Abdominal Pain  . Back Pain  . Rash   HPI Comments: Pt is a G2P1 @[redacted]w[redacted]d  arrives unannounced w several c/o. States her R hand is swelling, woke up that way this morning, hasn't decreased or worsened throughout the day, has FROM, doesn't recall an injury, has full strength. Also c/o ?rash on both elbows. Doesn't itch, has not spread anywhere, started today. Also c/o abdominal cramping, x2wks, feels it has gotten worse, and c/o back pain, hasn't tried anything for the pain. Denies any vag bleeding or LOF.   Abdominal Pain  Back Pain Associated symptoms include abdominal pain.  Rash     Past Medical History  Diagnosis Date  . Asthma   . Pyloric stenosis     Past Surgical History  Procedure Laterality Date  . Pyloromyotomy      Family History  Problem Relation Age of Onset  . Arthritis Mother   . Cancer Mother     thyroid  . Depression Mother   . Hyperlipidemia Father   . Asthma Father   . Hypertension Father   . COPD Maternal Grandfather   . Diabetes Paternal Grandmother   . Arthritis Paternal Grandmother   . Asthma Paternal Grandmother   . Heart disease Paternal Grandmother   . Depression Paternal Grandmother   . Hypertension Paternal Grandmother   . Other Neg Hx     History  Substance Use Topics  . Smoking status: Current Some Day Smoker -- 0.25 packs/day    Types: Cigarettes    Last Attempt to Quit: 05/08/2012  . Smokeless tobacco: Never Used  . Alcohol Use: No    Allergies: No Known Allergies  Prescriptions prior to admission  Medication Sig Dispense Refill  . metroNIDAZOLE (METROGEL) 0.75 % vaginal gel Place 1 Applicatorful vaginally at bedtime. 5 days tx      . Prenatal Vit-Fe Fumarate-FA (PRENATAL MULTIVITAMIN) TABS tablet Take 1 tablet by mouth daily at 12 noon.      Marland Kitchen albuterol (PROVENTIL HFA;VENTOLIN HFA) 108 (90 BASE)  MCG/ACT inhaler Inhale 2 puffs into the lungs every 6 (six) hours as needed (asthma).        Review of Systems  Gastrointestinal: Positive for abdominal pain.  Musculoskeletal: Positive for back pain.  Skin: Positive for rash.  All other systems reviewed and are negative.  Physical Exam   Blood pressure 106/72, pulse 64, temperature 99.4 F (37.4 C), temperature source Oral, resp. rate 18, height 5' (1.524 m), weight 153 lb (69.4 kg), last menstrual period 06/27/2013, SpO2 98.00%, not currently breastfeeding.  Physical Exam  Nursing note and vitals reviewed. Constitutional: She is oriented to person, place, and time. She appears well-developed and well-nourished.  HENT:  Head: Normocephalic.  Eyes: Pupils are equal, round, and reactive to light.  Neck: Normal range of motion.  Cardiovascular: Normal rate.   Respiratory: Effort normal.  GI: Soft.  Genitourinary: Vagina normal.  SSE - sm amt white clumpy d/c in vault, cervix cl/firm/long   Musculoskeletal: Normal range of motion.  Neurological: She is alert and oriented to person, place, and time. She has normal reflexes.  Skin: Skin is warm and dry. Rash noted.  On both elbows localized round approx 3cm area of redness, not raised.   Psychiatric: She has a normal mood and affect. Her behavior is normal.     MAU  Course  Procedures    Assessment and Plan  IUP at [redacted]w[redacted]d Currently on metrogel for BV from 6/22 UA +leuk, sent for cx Wet prep - +clue cells as expected otherwise normal  GC/CT sent Pt given motrin 600mg  PO, feels better Unsure etiology of rash and wrist swelling, if not improved will refer as appropriate  dc'd home stable condition w rx motrin 600mg  PO q6h scheduled for 72hours then PRN as needed F/u office routine    LILLARD,SHELLEY M 06/26/2014, 10:35 PM

## 2014-06-26 NOTE — Discharge Instructions (Signed)
Bacterial Vaginosis Bacterial vaginosis is an infection of the vagina. It happens when too many of certain germs (bacteria) grow in the vagina. HOME CARE  Take your medicine as told by your doctor.  Finish your medicine even if you start to feel better.  Do not have sex until you finish your medicine and are better.  Tell your sex partner that you have an infection. They should see their doctor for treatment.  Practice safe sex. Use condoms. Have only one sex partner. GET HELP IF:  You are not getting better after 3 days of treatment.  You have more grey fluid (discharge) coming from your vagina than before.  You have more pain than before.  You have a fever. MAKE SURE YOU:   Understand these instructions.  Will watch your condition.  Will get help right away if you are not doing well or get worse. Document Released: 09/24/2008 Document Revised: 10/06/2013 Document Reviewed: 07/28/2013 [employer] Patient Information 2015 [employer]. This information is not intended to replace advice given to you by your health care provider. Make sure you discuss any questions you have with your health care provider.  

## 2014-06-27 LAB — GC/CHLAMYDIA PROBE AMP
CT Probe RNA: NEGATIVE
GC Probe RNA: NEGATIVE

## 2014-06-29 ENCOUNTER — Emergency Department (HOSPITAL_COMMUNITY)
Admission: EM | Admit: 2014-06-29 | Discharge: 2014-06-29 | Disposition: A | Discharge status: Home / Self Care | Payer: Medicaid Other | Attending: Emergency Medicine | Admitting: Emergency Medicine

## 2014-06-29 ENCOUNTER — Emergency Department (HOSPITAL_COMMUNITY): Payer: Medicaid Other

## 2014-06-29 ENCOUNTER — Encounter (HOSPITAL_COMMUNITY): Payer: Self-pay | Admitting: Emergency Medicine

## 2014-06-29 DIAGNOSIS — Z79899 Other long term (current) drug therapy: Secondary | ICD-10-CM | POA: Insufficient documentation

## 2014-06-29 DIAGNOSIS — R000 Tachycardia, unspecified: Secondary | ICD-10-CM | POA: Insufficient documentation

## 2014-06-29 DIAGNOSIS — O99330 Smoking (tobacco) complicating pregnancy, unspecified trimester: Secondary | ICD-10-CM | POA: Insufficient documentation

## 2014-06-29 DIAGNOSIS — R1084 Generalized abdominal pain: Secondary | ICD-10-CM | POA: Insufficient documentation

## 2014-06-29 DIAGNOSIS — R21 Rash and other nonspecific skin eruption: Secondary | ICD-10-CM | POA: Diagnosis present

## 2014-06-29 DIAGNOSIS — Y9289 Other specified places as the place of occurrence of the external cause: Secondary | ICD-10-CM | POA: Insufficient documentation

## 2014-06-29 DIAGNOSIS — O9989 Other specified diseases and conditions complicating pregnancy, childbirth and the puerperium: Secondary | ICD-10-CM | POA: Insufficient documentation

## 2014-06-29 DIAGNOSIS — N898 Other specified noninflammatory disorders of vagina: Secondary | ICD-10-CM | POA: Insufficient documentation

## 2014-06-29 DIAGNOSIS — Y9389 Activity, other specified: Secondary | ICD-10-CM | POA: Insufficient documentation

## 2014-06-29 DIAGNOSIS — S40269A Insect bite (nonvenomous) of unspecified shoulder, initial encounter: Secondary | ICD-10-CM | POA: Insufficient documentation

## 2014-06-29 DIAGNOSIS — IMO0002 Reserved for concepts with insufficient information to code with codable children: Secondary | ICD-10-CM

## 2014-06-29 DIAGNOSIS — W57XXXA Bitten or stung by nonvenomous insect and other nonvenomous arthropods, initial encounter: Secondary | ICD-10-CM | POA: Insufficient documentation

## 2014-06-29 DIAGNOSIS — J45909 Unspecified asthma, uncomplicated: Secondary | ICD-10-CM | POA: Insufficient documentation

## 2014-06-29 DIAGNOSIS — R109 Unspecified abdominal pain: Secondary | ICD-10-CM

## 2014-06-29 LAB — CBC WITH DIFFERENTIAL/PLATELET
Basophils Absolute: 0 10*3/uL (ref 0.0–0.1)
Basophils Relative: 0 % (ref 0–1)
EOS PCT: 2 % (ref 0–5)
Eosinophils Absolute: 0.2 10*3/uL (ref 0.0–0.7)
HEMATOCRIT: 33.1 % — AB (ref 36.0–46.0)
Hemoglobin: 11.3 g/dL — ABNORMAL LOW (ref 12.0–15.0)
Lymphocytes Relative: 18 % (ref 12–46)
Lymphs Abs: 2.2 10*3/uL (ref 0.7–4.0)
MCH: 29.2 pg (ref 26.0–34.0)
MCHC: 34.1 g/dL (ref 30.0–36.0)
MCV: 85.5 fL (ref 78.0–100.0)
Monocytes Absolute: 0.6 10*3/uL (ref 0.1–1.0)
Monocytes Relative: 5 % (ref 3–12)
Neutro Abs: 9.2 10*3/uL — ABNORMAL HIGH (ref 1.7–7.7)
Neutrophils Relative %: 75 % (ref 43–77)
PLATELETS: 322 10*3/uL (ref 150–400)
RBC: 3.87 MIL/uL (ref 3.87–5.11)
RDW: 12.8 % (ref 11.5–15.5)
WBC: 12.2 10*3/uL — AB (ref 4.0–10.5)

## 2014-06-29 LAB — BASIC METABOLIC PANEL
Anion gap: 16 — ABNORMAL HIGH (ref 5–15)
BUN: 6 mg/dL (ref 6–23)
CALCIUM: 8.9 mg/dL (ref 8.4–10.5)
CO2: 21 meq/L (ref 19–32)
Chloride: 102 mEq/L (ref 96–112)
Creatinine, Ser: 0.43 mg/dL — ABNORMAL LOW (ref 0.50–1.10)
GFR calc Af Amer: >90 mL/min (ref >90)
GFR calc non Af Amer: >90 mL/min (ref >90)
GLUCOSE: 79 mg/dL (ref 70–99)
Potassium: 4.1 mEq/L (ref 3.7–5.3)
Sodium: 139 mEq/L (ref 137–147)

## 2014-06-29 LAB — HCG, QUANTITATIVE, PREGNANCY: HCG, BETA CHAIN, QUANT, S: 31938 m[IU]/mL — AB (ref <5)

## 2014-06-29 LAB — WET PREP, GENITAL
CLUE CELLS WET PREP: NONE SEEN
TRICH WET PREP: NONE SEEN
YEAST WET PREP: NONE SEEN

## 2014-06-29 LAB — URINALYSIS, ROUTINE W REFLEX MICROSCOPIC
Bilirubin Urine: NEGATIVE
GLUCOSE, UA: NEGATIVE mg/dL
HGB URINE DIPSTICK: NEGATIVE
Ketones, ur: NEGATIVE mg/dL
Leukocytes, UA: NEGATIVE
Nitrite: NEGATIVE
PH: 5.5 (ref 5.0–8.0)
Protein, ur: NEGATIVE mg/dL
SPECIFIC GRAVITY, URINE: 1.016 (ref 1.005–1.030)
Urobilinogen, UA: 0.2 mg/dL (ref 0.0–1.0)

## 2014-06-29 MED ORDER — CEPHALEXIN 250 MG PO CAPS: 250.0000 mg | ORAL_CAPSULE | Freq: Four times a day (QID) | ORAL | Status: DC

## 2014-06-29 NOTE — ED Provider Notes (Signed)
Patient care assumed at 1600 with Wet prep and ultrasound pending.    Presenting symptoms.  - abdominal cramping in lower abdomen for 3 days without bleeding  - vaginal discharge  - os closed with no bleeding on pelvic exam   Keflex for skin infection prescribed per original provider.   Ultrasound was reviewed and showed IUP with no other pathology. Wet prep was negative. Patient discharged with antibiotics for skin and informed to follow-up with OB/GYN provider.   Claudean Severance, MD 06/30/14 2216

## 2014-06-29 NOTE — ED Notes (Signed)
Patient transported to Ultrasound 

## 2014-06-29 NOTE — ED Provider Notes (Signed)
CSN: [id number]     Arrival date & time 06/29/14  1253 History   First MD Initiated Contact with Patient 06/29/14 1420     Chief Complaint  Patient presents with  . Abdominal Cramping    pregnant [redacted] weeks  . Rash     (Consider location/radiation/quality/duration/timing/severity/associated sxs/prior Treatment) HPI Comments: Patient is a G74P1 female at 16 weeks who presents with abdominal pain. The pain is located in her lower abdomen and does not radiate. The pain is described as cramping and severe. The pain started gradually and progressively worsened since the onset. No alleviating/aggravating factors. The patient has tried nothing for symptoms without relief. Associated symptoms include vaginal discharge. Patient denies fever, headache, NVD, chest pain, SOB, dysuria, constipation, abnormal vaginal bleeding. Patient also complains of rash on her arms that spread to her right leg today. Patient thinks she was bit by a bug. The areas of the rash are painful and red and warm to touch. She denies any injury or exposure.       Past Medical History  Diagnosis Date  . Asthma   . Pyloric stenosis    Past Surgical History  Procedure Laterality Date  . Pyloromyotomy     Family History  Problem Relation Age of Onset  . Arthritis Mother   . Cancer Mother     thyroid  . Depression Mother   . Hyperlipidemia Father   . Asthma Father   . Hypertension Father   . COPD Maternal Grandfather   . Diabetes Paternal Grandmother   . Arthritis Paternal Grandmother   . Asthma Paternal Grandmother   . Heart disease Paternal Grandmother   . Depression Paternal Grandmother   . Hypertension Paternal Grandmother   . Other Neg Hx    History  Substance Use Topics  . Smoking status: Current Some Day Smoker -- 0.25 packs/day    Types: Cigarettes    Last Attempt to Quit: 05/08/2012  . Smokeless tobacco: Never Used  . Alcohol Use: No   OB History   Grav Para Term Preterm Abortions TAB SAB Ect Mult  Living   2 1 1  0 0 0 0 0 0 1     Review of Systems  Constitutional: Negative for fever, chills and fatigue.  HENT: Negative for trouble swallowing.   Eyes: Negative for visual disturbance.  Respiratory: Negative for shortness of breath.   Cardiovascular: Negative for chest pain and palpitations.  Gastrointestinal: Positive for abdominal pain. Negative for nausea, vomiting and diarrhea.  Genitourinary: Positive for vaginal discharge. Negative for dysuria and difficulty urinating.  Musculoskeletal: Negative for arthralgias and neck pain.  Skin: Positive for rash. Negative for color change.  Neurological: Negative for dizziness and weakness.  Psychiatric/Behavioral: Negative for dysphoric mood.      Allergies  Review of patient's allergies indicates no known allergies.  Home Medications   Prior to Admission medications   Medication Sig Start Date End Date Taking? Authorizing Provider  albuterol (PROVENTIL HFA;VENTOLIN HFA) 108 (90 BASE) MCG/ACT inhaler Inhale 2 puffs into the lungs every 6 (six) hours as needed (asthma).    Historical Provider, MD  ibuprofen (ADVIL,MOTRIN) 600 MG tablet Take 1 tablet (600 mg total) by mouth every 6 (six) hours as needed (take as scheduled every 6 hours for the next 48 hours). 06/26/14   Charlane Ferretti, CNM  metroNIDAZOLE (METROGEL) 0.75 % vaginal gel Place 1 Applicatorful vaginally at bedtime. 5 days tx    Historical Provider, MD  Prenatal Vit-Fe Fumarate-FA (PRENATAL MULTIVITAMIN) TABS  tablet Take 1 tablet by mouth daily at 12 noon.    Historical Provider, MD   BP 130/73  Pulse 117  Temp(Src) 99.3 F (37.4 C) (Oral)  Resp 18  Ht 5' (1.524 m)  Wt 155 lb (70.308 kg)  BMI 30.27 kg/m2  SpO2 97%  LMP 06/27/2013 Physical Exam  Nursing note and vitals reviewed. Constitutional: She is oriented to person, place, and time. She appears well-developed and well-nourished. No distress.  HENT:  Head: Normocephalic and atraumatic.  Eyes: Conjunctivae  are normal.  Neck: Normal range of motion.  Cardiovascular: Regular rhythm.  Exam reveals no gallop and no friction rub.   No murmur heard. tachycardic  Pulmonary/Chest: Effort normal and breath sounds normal. She has no wheezes. She has no rales. She exhibits no tenderness.  Abdominal: Soft. She exhibits no distension. There is tenderness. There is no rebound and no guarding.  Gravid abdomen with generalized tenderness to palpation. No focal tenderness.   Genitourinary: Vagina normal.  Copious, thick, white cottage-cheese like discharge in vagina. Cervical os closed. No CMT. Generalized tenderness to palpation on bimanual exam.   Musculoskeletal: Normal range of motion.  Neurological: She is alert and oriented to person, place, and time. Coordination normal.  Speech is goal-oriented. Moves limbs without ataxia.   Skin: Skin is warm and dry.  Scattered 3x3cm areas of induration, erythema, warmth and tenderness to right leg and left arm. No open wounds.   Psychiatric: She has a normal mood and affect. Her behavior is normal.    ED Course  Procedures (including critical care time) Labs Review Labs Reviewed  WET PREP, GENITAL - Abnormal; Notable for the following:    WBC, Wet Prep HPF POC FEW (*)    All other components within normal limits  CBC WITH DIFFERENTIAL - Abnormal; Notable for the following:    WBC 12.2 (*)    Hemoglobin 11.3 (*)    HCT 33.1 (*)    Neutro Abs 9.2 (*)    All other components within normal limits  BASIC METABOLIC PANEL - Abnormal; Notable for the following:    Creatinine, Ser 0.43 (*)    Anion gap 16 (*)    All other components within normal limits  HCG, QUANTITATIVE, PREGNANCY - Abnormal; Notable for the following:    hCG, Beta Chain, Quant, [place] [address] (*)    All other components within normal limits  GC/CHLAMYDIA PROBE AMP  URINALYSIS, ROUTINE W REFLEX MICROSCOPIC    Imaging Review US Ob Limited  06/29/2014   CLINICAL DATA:  Abdominal and pelvic pain.  Vaginal discharge. [redacted] weeks pregnant.  EXAM: LIMITED OBSTETRIC ULTRASOUND  FINDINGS: Number of Fetuses: 1  Heart Rate:  152 bpm  Movement: Yes  Presentation: Breech  Placental Location: Anterior  Previa: No  Amniotic Fluid (Subjective):  Within normal limits.  MATERNAL FINDINGS:  Cervix:  Appears closed.  Uterus/Adnexae:  No abnormality visualized.  IMPRESSION: Single living intrauterine fetus in breech presentation.  No acute maternal findings visualized.  This exam is performed on an emergent basis and does not comprehensively evaluate fetal size, dating, or anatomy; follow-up complete OB US should be considered if further fetal assessment is warranted.   Electronically Signed   By: Earle Gell M.D.   On: 06/29/2014 17:05     EKG Interpretation None      MDM   Final diagnoses:  Abdominal cramping  Rash  Bug bite of shoulder or upper arm    2:39 PM Labs, urinalysis, and wet prep  pending. Patient is tachycardic likely due to being nervous about the hospital. Patient has no chest pain or SOB.   Patient signed out to Dr. Claudean Severance pending [name] and wet prep results.   Alvina Chou, PA-C 06/30/14 1113

## 2014-06-29 NOTE — ED Notes (Addendum)
Saturday noticed left elbow swollen, [facility] was [facility]  Pt now states red swollen burning spots, now to back of right leg and on left shoulder. Pt is [redacted] weeks pregnant and reports vaginal discharge abdominal and back pain cramping.  Pt states discharge is worse even after taking anbx.

## 2014-06-29 NOTE — ED Notes (Signed)
Pt returned from US

## 2014-06-29 NOTE — ED Notes (Signed)
[facility] 12/15/14 G2P1A0

## 2014-06-29 NOTE — ED Notes (Signed)
FINISHED FLAGYL VAGINAL GEL 2 DAYS AGO FOR BACTERIAL VAGINOSIS PER PT

## 2014-06-29 NOTE — Discharge Instructions (Signed)
Following up with your OB/GYN for further complaints  Abdominal Pain During Pregnancy Abdominal pain is common in pregnancy. Most of the time, it does not cause harm. There are many causes of abdominal pain. Some causes are more serious than others. Some of the causes of abdominal pain in pregnancy are easily diagnosed. Occasionally, the diagnosis takes time to understand. Other times, the cause is not determined. Abdominal pain can be a sign that something is very wrong with the pregnancy, or the pain may have nothing to do with the pregnancy at all. For this reason, always tell your health care provider if you have any abdominal discomfort. HOME CARE INSTRUCTIONS  Monitor your abdominal pain for any changes. The following actions may help to alleviate any discomfort you are experiencing:  Do not have sexual intercourse or put anything in your vagina until your symptoms go away completely.  Get plenty of rest until your pain improves.  Drink clear fluids if you feel nauseous. Avoid solid food as long as you are uncomfortable or nauseous.  Only take over-the-counter or prescription medicine as directed by your health care provider.  Keep all follow-up appointments with your health care provider. SEEK IMMEDIATE MEDICAL CARE IF:  You are bleeding, leaking fluid, or passing tissue from the vagina.  You have increasing pain or cramping.  You have persistent vomiting.  You have painful or bloody urination.  You have a fever.  You notice a decrease in your baby's movements.  You have extreme weakness or feel faint.  You have shortness of breath, with or without abdominal pain.  You develop a severe headache with abdominal pain.  You have abnormal vaginal discharge with abdominal pain.  You have persistent diarrhea.  You have abdominal pain that continues even after rest, or gets worse. MAKE SURE YOU:   Understand these instructions.  Will watch your condition.  Will get help  right away if you are not doing well or get worse. Document Released: 12/16/2005 Document Revised: 10/06/2013 Document Reviewed: 07/15/2013 [facility] Patient Information 2015 [place], [place]. This information is not intended to replace advice given to you by your health care provider. Make sure you discuss any questions you have with your health care provider.

## 2014-06-30 LAB — GC/CHLAMYDIA PROBE AMP
CT Probe RNA: NEGATIVE
GC Probe RNA: NEGATIVE

## 2014-06-30 NOTE — ED Provider Notes (Signed)
Medical screening examination/treatment/procedure(s) were performed by non-physician practitioner and as supervising physician I was immediately available for consultation/collaboration.   EKG Interpretation None      BP 100/45  Pulse 85  Temp(Src) 99.3 F (37.4 C) (Oral)  Resp 15  Ht 5' (1.524 m)  Wt 155 lb (70.308 kg)  BMI 30.27 kg/m2  SpO2 97%  LMP 06/27/2013   Ezequiel Essex, MD 06/30/14 1517

## 2014-07-01 NOTE — ED Provider Notes (Signed)
I saw and evaluated the patient, reviewed the resident's note and I agree with the findings and plan.   EKG Interpretation None       Jasper Riling. Alvino Chapel, MD 07/01/14 [phone]

## 2014-07-25 ENCOUNTER — Encounter (HOSPITAL_COMMUNITY): Payer: Self-pay | Admitting: *Deleted

## 2014-07-25 ENCOUNTER — Other Ambulatory Visit (HOSPITAL_COMMUNITY): Payer: Self-pay | Admitting: Certified Nurse Midwife

## 2014-07-25 ENCOUNTER — Inpatient Hospital Stay (HOSPITAL_COMMUNITY)
Admission: AD | Admit: 2014-07-25 | Discharge: 2014-07-25 | Disposition: A | Discharge status: Home / Self Care | Payer: Medicaid Other | Source: Ambulatory Visit | Attending: Obstetrics & Gynecology | Admitting: Obstetrics & Gynecology

## 2014-07-25 DIAGNOSIS — F17200 Nicotine dependence, unspecified, uncomplicated: Secondary | ICD-10-CM | POA: Insufficient documentation

## 2014-07-25 DIAGNOSIS — M25519 Pain in unspecified shoulder: Secondary | ICD-10-CM | POA: Insufficient documentation

## 2014-07-25 DIAGNOSIS — Z3689 Encounter for other specified antenatal screening: Secondary | ICD-10-CM

## 2014-07-25 DIAGNOSIS — J45901 Unspecified asthma with (acute) exacerbation: Secondary | ICD-10-CM | POA: Insufficient documentation

## 2014-07-25 DIAGNOSIS — Z2839 Other underimmunization status: Secondary | ICD-10-CM | POA: Insufficient documentation

## 2014-07-25 DIAGNOSIS — O99891 Other specified diseases and conditions complicating pregnancy: Secondary | ICD-10-CM | POA: Diagnosis not present

## 2014-07-25 DIAGNOSIS — Z283 Underimmunization status: Secondary | ICD-10-CM | POA: Insufficient documentation

## 2014-07-25 DIAGNOSIS — IMO0002 Reserved for concepts with insufficient information to code with codable children: Secondary | ICD-10-CM

## 2014-07-25 DIAGNOSIS — O09899 Supervision of other high risk pregnancies, unspecified trimester: Secondary | ICD-10-CM | POA: Insufficient documentation

## 2014-07-25 DIAGNOSIS — O9989 Other specified diseases and conditions complicating pregnancy, childbirth and the puerperium: Principal | ICD-10-CM

## 2014-07-25 DIAGNOSIS — Q899 Congenital malformation, unspecified: Secondary | ICD-10-CM | POA: Insufficient documentation

## 2014-07-25 DIAGNOSIS — O99330 Smoking (tobacco) complicating pregnancy, unspecified trimester: Secondary | ICD-10-CM | POA: Insufficient documentation

## 2014-07-25 MED ORDER — ALBUTEROL SULFATE HFA 108 (90 BASE) MCG/ACT IN AERS: 2.0000 | INHALATION_SPRAY | Freq: Four times a day (QID) | RESPIRATORY_TRACT | Status: DC | PRN

## 2014-07-25 MED ORDER — MONTELUKAST SODIUM 10 MG PO TABS: 10.0000 mg | ORAL_TABLET | Freq: Every day | ORAL | Status: DC

## 2014-07-25 MED ORDER — ALBUTEROL SULFATE (2.5 MG/3ML) 0.083% IN NEBU
2.5000 mg | INHALATION_SOLUTION | Freq: Once | RESPIRATORY_TRACT | Status: AC
  Administered 2014-07-25: 2.5 mg via RESPIRATORY_TRACT
  Filled 2014-07-25: qty 3

## 2014-07-25 NOTE — MAU Note (Signed)
Hx of asthma and states she is having issues breathing and chest pain. Uses albuterol inhaler for asthma which is currently empty. Called the doctor and states they wont refill her Rx until her next appt. C/o of hot flashes since prior to pregnancy. States she woke up yesterday with aching pain in left arm that becoming a shooting pain with movement. States the pain is mostly in the shoulder area.

## 2014-07-25 NOTE — Discharge Instructions (Signed)

## 2014-07-25 NOTE — MAU Provider Note (Signed)
History  23 yo G2P1001 @ 19.4 wks presents to MAU unannounced w/ c/o "breathing problems" that started yesterday, worse today, along w/ left arm/shoulder pain. States has not taken anything for the pain and has not lifted anything heavy. Pt is a known asthmatic and reports she has run out of her inhaler.  Denies quickening, LOF or VB.  Having "hot flashes" and admits to only drinking four bottles (16.9 oz) of water per day.  Patient Active Problem List   Diagnosis Date Noted  . Asthma with acute exacerbation 07/25/2014  . Smoker 07/25/2014  . Rubella non-immune status, antepartum 07/25/2014  . 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 Asthma  . Chest Pain  . Arm Pain   HPI See above OB History   Grav Para Term Preterm Abortions TAB SAB Ect Mult Living   2 1 1  0 0 0 0 0 0 1      Past Medical History  Diagnosis Date  . Asthma   . Pyloric stenosis     Past Surgical History  Procedure Laterality Date  . Pyloromyotomy      Family History  Problem Relation Age of Onset  . Arthritis Mother   . Cancer Mother     thyroid  . Depression Mother   . Hyperlipidemia Father   . Asthma Father   . Hypertension Father   . COPD Maternal Grandfather   . Diabetes Paternal Grandmother   . Arthritis Paternal Grandmother   . Asthma Paternal Grandmother   . Heart disease Paternal Grandmother   . Depression Paternal Grandmother   . Hypertension Paternal Grandmother   . Other Neg Hx     History  Substance Use Topics  . Smoking status: Current Every Day Smoker -- 0.25 packs/day    Types: Cigarettes    Last Attempt to Quit: 05/08/2012  . Smokeless tobacco: Never Used  . Alcohol Use: No    Allergies: No Known Allergies  No prescriptions prior to admission    ROS Wheezing Chest tightness Left arm/shoulder pain Physical Exam   Blood pressure 111/63, pulse 88, temperature 98.7  F (37.1 C), temperature source Oral, resp. rate 20, height 5' (1.524 m), weight 155 lb (70.308 kg), last menstrual period 06/27/2013, SpO2 99.00%.  Physical Exam Gen: NAD Lungs: Inspiratory and Expiratory wheezing in all lung fields yet breathing unlabored; SP02 99% on RA CV: RRR Abdomen: soft, NTND, no guarding or rebound Non-tender left arm/shoulder  Pelvic: Deferred Ext: WNL FHT: 150s ED Course  Assessment: Asthma exacerbation Smoker  Plan: Albuterol tx completed by RT - chest tightness, arm/shoulder pain resolved after treatment. Lungs clear after tx. Discussed the importance of ensuring adequate supply of inhalers during pregnancy. Informed that uncontrolled asthma puts her and her baby at significant risk and adherence to treatment is critical in preventing asthma exacerbation. Informed that severe asthma can lead to preeclampsia, PTB, and low-birth weight.  D/C'd home w/ Albuterol inhaler & Singulair po. UA ordered but not resulted. Increase po fluids (water). RTO in 48 hrs for asthma action plan and possible PFTs. Tylenol prn. Cold or warm compress if pain from arm/shoulder resurfaces. Smoking cessation. Late SAB precautions.   ROSABELLE, JUPIN CNM, MS 07/25/14 @ 2258

## 2014-07-26 ENCOUNTER — Inpatient Hospital Stay (HOSPITAL_COMMUNITY)
Admission: AD | Admit: 2014-07-26 | Discharge: 2014-07-27 | Disposition: A | Discharge status: Home / Self Care | Payer: Medicaid Other | Source: Ambulatory Visit | Attending: Obstetrics & Gynecology | Admitting: Obstetrics & Gynecology

## 2014-07-26 ENCOUNTER — Encounter (HOSPITAL_COMMUNITY): Payer: Self-pay

## 2014-07-26 DIAGNOSIS — O9989 Other specified diseases and conditions complicating pregnancy, childbirth and the puerperium: Principal | ICD-10-CM

## 2014-07-26 DIAGNOSIS — R079 Chest pain, unspecified: Secondary | ICD-10-CM | POA: Insufficient documentation

## 2014-07-26 DIAGNOSIS — R0989 Other specified symptoms and signs involving the circulatory and respiratory systems: Secondary | ICD-10-CM | POA: Insufficient documentation

## 2014-07-26 DIAGNOSIS — R0602 Shortness of breath: Secondary | ICD-10-CM | POA: Insufficient documentation

## 2014-07-26 DIAGNOSIS — R0609 Other forms of dyspnea: Secondary | ICD-10-CM | POA: Insufficient documentation

## 2014-07-26 DIAGNOSIS — O99891 Other specified diseases and conditions complicating pregnancy: Secondary | ICD-10-CM | POA: Insufficient documentation

## 2014-07-26 NOTE — MAU Note (Signed)
Pt reports shortness of breath, chest pains, and difficulty breathing. Was seen here last night for same complaint and was told it was due to asthma, given inhaler. States the meds have not helped at all.

## 2014-07-27 DIAGNOSIS — R0602 Shortness of breath: Secondary | ICD-10-CM | POA: Diagnosis present

## 2014-07-27 DIAGNOSIS — R0609 Other forms of dyspnea: Secondary | ICD-10-CM | POA: Diagnosis not present

## 2014-07-27 DIAGNOSIS — O99891 Other specified diseases and conditions complicating pregnancy: Secondary | ICD-10-CM | POA: Diagnosis not present

## 2014-07-27 DIAGNOSIS — R079 Chest pain, unspecified: Secondary | ICD-10-CM | POA: Diagnosis not present

## 2014-07-27 MED ORDER — BECLOMETHASONE DIPROPIONATE 40 MCG/ACT IN AERS: 1.0000 | INHALATION_SPRAY | Freq: Two times a day (BID) | RESPIRATORY_TRACT | Status: DC

## 2014-07-27 MED ORDER — ALBUTEROL SULFATE (2.5 MG/3ML) 0.083% IN NEBU
2.5000 mg | INHALATION_SOLUTION | Freq: Once | RESPIRATORY_TRACT | Status: AC
  Administered 2014-07-27: 2.5 mg via RESPIRATORY_TRACT
  Filled 2014-07-27: qty 3

## 2014-07-27 NOTE — Discharge Instructions (Signed)
Asthma Attack Prevention Although there is no way to prevent asthma from starting, you can take steps to control the disease and reduce its symptoms. Learn about your asthma and how to control it. Take an active role to control your asthma by working with your health care provider to create and follow an asthma action plan. An asthma action plan guides you in:  Taking your medicines properly.  Avoiding things that set off your asthma or make your asthma worse (asthma triggers).  Tracking your level of asthma control.  Responding to worsening asthma.  Seeking emergency care when needed. To track your asthma, keep records of your symptoms, check your peak flow number using a handheld device that shows how well air moves out of your lungs (peak flow meter), and get regular asthma checkups.  WHAT ARE SOME WAYS TO PREVENT AN ASTHMA ATTACK?  Take medicines as directed by your health care provider.  Keep track of your asthma symptoms and level of control.  With your health care provider, write a detailed plan for taking medicines and managing an asthma attack. Then be sure to follow your action plan. Asthma is an ongoing condition that needs regular monitoring and treatment.  Identify and avoid asthma triggers. Many outdoor allergens and irritants (such as pollen, mold, cold air, and air pollution) can trigger asthma attacks. Find out what your asthma triggers are and take steps to avoid them.  Monitor your breathing. Learn to recognize warning signs of an attack, such as coughing, wheezing, or shortness of breath. Your lung function may decrease before you notice any signs or symptoms, so regularly measure and record your peak airflow with a home peak flow meter.  Identify and treat attacks early. If you act quickly, you are less likely to have a severe attack. You will also need less medicine to control your symptoms. When your peak flow measurements decrease and alert you to an upcoming attack,  take your medicine as instructed and immediately stop any activity that may have triggered the attack. If your symptoms do not improve, get medical help.  Pay attention to increasing quick-relief inhaler use. If you find yourself relying on your quick-relief inhaler, your asthma is not under control. See your health care provider about adjusting your treatment. WHAT CAN MAKE MY SYMPTOMS WORSE? A number of common things can set off or make your asthma symptoms worse and cause temporary increased inflammation of your airways. Keep track of your asthma symptoms for several weeks, detailing all the environmental and emotional factors that are linked with your asthma. When you have an asthma attack, go back to your asthma diary to see which factor, or combination of factors, might have contributed to it. Once you know what these factors are, you can take steps to control many of them. If you have allergies and asthma, it is important to take asthma prevention steps at home. Minimizing contact with the substance to which you are allergic will help prevent an asthma attack. Some triggers and ways to avoid these triggers are: Animal Dander:  Some people are allergic to the flakes of skin or dried saliva from animals with fur or feathers.   There is no such thing as a hypoallergenic dog or cat breed. All dogs or cats can cause allergies, even if they don't shed.  Keep these pets out of your home.  If you are not able to keep a pet outdoors, keep the pet out of your bedroom and other sleeping areas at all  times, and keep the door closed.  Remove carpets and furniture covered with cloth from your home. If that is not possible, keep the pet away from fabric-covered furniture and carpets. Dust Mites: Many people with asthma are allergic to dust mites. Dust mites are tiny bugs that are found in every home in mattresses, pillows, carpets, fabric-covered furniture, bedcovers, clothes, stuffed toys, and other  fabric-covered items.   Cover your mattress in a special dust-proof cover.  Cover your pillow in a special dust-proof cover, or wash the pillow each week in hot water. Water must be hotter than 130 F (54.4 C) to kill dust mites. Cold or warm water used with detergent and bleach can also be effective.  Wash the sheets and blankets on your bed each week in hot water.  Try not to sleep or lie on cloth-covered cushions.  Call ahead when traveling and ask for a smoke-free hotel room. Bring your own bedding and pillows in case the hotel only supplies feather pillows and down comforters, which may contain dust mites and cause asthma symptoms.  Remove carpets from your bedroom and those laid on concrete, if you can.  Keep stuffed toys out of the bed, or wash the toys weekly in hot water or cooler water with detergent and bleach. Cockroaches: Many people with asthma are allergic to the droppings and remains of cockroaches.   Keep food and garbage in closed containers. Never leave food out.  Use poison baits, traps, powders, gels, or paste (for example, boric acid).  If a spray is used to kill cockroaches, stay out of the room until the odor goes away. Indoor Mold:  Fix leaky faucets, pipes, or other sources of water that have mold around them.  Clean floors and moldy surfaces with a fungicide or diluted bleach.  Avoid using humidifiers, vaporizers, or swamp coolers. These can spread molds through the air. Pollen and Outdoor Mold:  When pollen or mold spore counts are high, try to keep your windows closed.  Stay indoors with windows closed from late morning to afternoon. Pollen and some mold spore counts are highest at that time.  Ask your health care provider whether you need to take anti-inflammatory medicine or increase your dose of the medicine before your allergy season starts. Other Irritants to Avoid:  Tobacco smoke is an irritant. If you smoke, ask your health care provider how  you can quit. Ask family members to quit smoking, too. Do not allow smoking in your home or car.  If possible, do not use a wood-burning stove, kerosene heater, or fireplace. Minimize exposure to all sources of smoke, including incense, candles, fires, and fireworks.  Try to stay away from strong odors and sprays, such as perfume, talcum powder, hair spray, and paints.  Decrease humidity in your home and use an indoor air cleaning device. Reduce indoor humidity to below 60%. Dehumidifiers or central air conditioners can do this.  Decrease house dust exposure by changing furnace and air cooler filters frequently.  Try to have someone else vacuum for you once or twice a week. Stay out of rooms while they are being vacuumed and for a short while afterward.  If you vacuum, use a dust mask from a hardware store, a double-layered or microfilter vacuum cleaner bag, or a vacuum cleaner with a HEPA filter.  Sulfites in foods and beverages can be irritants. Do not drink beer or wine or eat dried fruit, processed potatoes, or shrimp if they cause asthma symptoms.  Cold  air can trigger an asthma attack. Cover your nose and mouth with a scarf on cold or windy days.  Several health conditions can make asthma more difficult to manage, including a runny nose, sinus infections, reflux disease, psychological stress, and sleep apnea. Work with your health care provider to manage these conditions.  Avoid close contact with people who have a respiratory infection such as a cold or the flu, since your asthma symptoms may get worse if you catch the infection. Wash your hands thoroughly after touching items that may have been handled by people with a respiratory infection.  Get a flu shot every year to protect against the flu virus, which often makes asthma worse for days or weeks. Also get a pneumonia shot if you have not previously had one. Unlike the flu shot, the pneumonia shot does not need to be given  yearly. Medicines:  Talk to your health care provider about whether it is safe for you to take aspirin or non-steroidal anti-inflammatory medicines (NSAIDs). In a small number of people with asthma, aspirin and NSAIDs can cause asthma attacks. These medicines must be avoided by people who have known aspirin-sensitive asthma. It is important that people with aspirin-sensitive asthma read labels of all over-the-counter medicines used to treat pain, colds, coughs, and fever.  Beta-blockers and ACE inhibitors are other medicines you should discuss with your health care provider. HOW CAN I FIND OUT WHAT I AM ALLERGIC TO? Ask your asthma health care provider about allergy skin testing or blood testing (the RAST test) to identify the allergens to which you are sensitive. If you are found to have allergies, the most important thing to do is to try to avoid exposure to any allergens that you are sensitive to as much as possible. Other treatments for allergies, such as medicines and allergy shots (immunotherapy) are available.  CAN I EXERCISE? Follow your health care provider's advice regarding asthma treatment before exercising. It is important to maintain a regular exercise program, but vigorous exercise or exercise in cold, humid, or dry environments can cause asthma attacks, especially for those people who have exercise-induced asthma. Document Released: 12/04/2009 Document Revised: 12/21/2013 Document Reviewed: 06/23/2013 [facility] Patient Information 2015 [place], [place]. This information is not intended to replace advice given to you by your health care provider. Make sure you discuss any questions you have with your health care provider. Pregnancy and Smoking Smoking during pregnancy is unhealthy for you and your developing baby. The addictive drug nicotine, carbon monoxide, and many other poisons are inhaled from a cigarette and carried through your bloodstream to your baby. Cigarette smoke contains more  than 2,500 chemicals. It is not known which of these are harmful to a developing baby. However, both nicotine and carbon monoxide play a role in causing health problems in pregnancy. Smoking during pregnancy increases the risk of:  Birth defects in your baby, including heart defects.  Miscarriage and stillbirth.  Birth before 77 completed weeks of pregnancy (premature birth).  Pregnancy outside of the uterus (tubal pregnancy).  Attachment of the placenta over the opening of the uterus (placenta previa).  Detachment of the placenta before the baby's birth (placental abruption).  Breaking of the bag of waters before labor begins (premature rupture of membranes). HOW DOES SMOKING DURING PREGNANCY AFFECT MY BABY? Before Birth Smoking during pregnancy:  Decreases blood flow and oxygen to your baby.  Increases the heart rate of your baby.  Slows your baby's growth in the uterus (intrauterine growth retardation). After Birth  Babies born to women who smoke during pregnancy are more likely to have a low birth weight. They are also at risk for:  Serious health problems, chronic or lifelong disabilities (cerebral palsy, mental retardation, learning problems), and death.  Sudden infant death syndrome (SIDS).  Lung and breathing problems. WHAT RESOURCES ARE AVAILABLE TO HELP ME STOP SMOKING?  Ask your health care provider for help to stop smoking. The following resources are available:  Counseling.  Psychological treatment.  Acupuncture.  Family intervention.  Hypnosis.  Nicotine supplements have not been studied enough to know if they are safe to use during pregnancy. They should only be considered when all other methods fail, and if used under the close supervision of your health care provider.  Telephone QUIT lines. The national smoking cessation telephone hotline number is 1-800-QUIT NOW. FOR MORE INFORMATION  American Cancer Society: www.cancer.org  American Heart  Association: www.heart.[place]: www.cancer.gov  March of [employer]: www.marchofdimes.org Document Released: 04/29/2005 Document Revised: 12/21/2013 Document Reviewed: 11/15/2013 [facility] Patient Information 2015 [place], [place]. This information is not intended to replace advice given to you by your health care provider. Make sure you discuss any questions you have with your health care provider.

## 2014-07-27 NOTE — MAU Provider Note (Signed)
History   23 yo G2P1001 @ 19.5 wks presents to MAU with c/o chest tightness, trouble breathing and SOB after having just been evaluated less than 24 hours ago for same.  Denies quickening, VB, LOF or cramping.   Pt smokes regularly, as does boyfriend. Last evening she stated she came to MAU to get refills on Albuterol, but did not pick up medication until today. Presents tonight stating Albuterol and Singulair are not working and the Albuterol has a "funny taste."   Patient Active Problem List   Diagnosis Date Noted  . Asthma with acute exacerbation 07/25/2014  . Smoker 07/25/2014  . Rubella non-immune status, antepartum 07/25/2014  . 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 Shortness of Breath  . Chest Pain   HPI See above OB History   Grav Para Term Preterm Abortions TAB SAB Ect Mult Living   2 1 1  0 0 0 0 0 0 1      Past Medical History  Diagnosis Date  . Asthma   . Pyloric stenosis     Past Surgical History  Procedure Laterality Date  . Pyloromyotomy      Family History  Problem Relation Age of Onset  . Arthritis Mother   . Cancer Mother     thyroid  . Depression Mother   . Hyperlipidemia Father   . Asthma Father   . Hypertension Father   . COPD Maternal Grandfather   . Diabetes Paternal Grandmother   . Arthritis Paternal Grandmother   . Asthma Paternal Grandmother   . Heart disease Paternal Grandmother   . Depression Paternal Grandmother   . Hypertension Paternal Grandmother   . Other Neg Hx     History  Substance Use Topics  . Smoking status: Current Every Day Smoker -- 0.25 packs/day    Types: Cigarettes    Last Attempt to Quit: 05/08/2012  . Smokeless tobacco: Never Used  . Alcohol Use: No    Allergies: No Known Allergies  No prescriptions prior to admission    ROS Chest tightness SOB Physical Exam   Blood pressure 127/69, pulse 95,  temperature 98.3 F (36.8 C), temperature source Oral, resp. rate 20, height 5' (1.524 m), weight 154 lb (69.854 kg), last menstrual period 06/27/2013, SpO2 99.00%.  Physical Exam Gen: NAD, laughing; SP02 99% in RA Lungs: CTAB after treatment. Some expiratory wheezing noted just before treatment in lung fields CV: RRR Abd: gravid, soft Pelvic: Deferred FHTs: 150s ED Course  Assessment: Uncontrolled asthma  Plan: 2nd Albuterol tx completed by RT (within 24 hours). Will try Beclomethasone inhaler. Continue Singulair. Reiterated the need to stop smoking and the importance of taking asthma meds as prescribed. Discussed the dangers of smoking as well as second hand smoking while pregnant and the effects smoking has on infant after delivery.  Reviewed dangers of uncontrolled asthma during pregnancy. Keep office appt tomorrow; will need AAP and PFTs.   STEPHANEY, STEVEN CNM, MS 07/26/14 @ 10:00 PM

## 2014-08-03 ENCOUNTER — Ambulatory Visit (HOSPITAL_COMMUNITY)
Admission: RE | Admit: 2014-08-03 | Discharge: 2014-08-03 | Disposition: A | Discharge status: Home / Self Care | Payer: Medicaid Other | Source: Ambulatory Visit | Attending: Certified Nurse Midwife | Admitting: Certified Nurse Midwife

## 2014-08-03 VITALS — BP 116/72 | HR 97 | Wt 154.5 lb

## 2014-08-03 DIAGNOSIS — IMO0002 Reserved for concepts with insufficient information to code with codable children: Secondary | ICD-10-CM

## 2014-08-03 DIAGNOSIS — Z3689 Encounter for other specified antenatal screening: Secondary | ICD-10-CM

## 2014-08-03 DIAGNOSIS — O358XX0 Maternal care for other (suspected) fetal abnormality and damage, not applicable or unspecified: Secondary | ICD-10-CM | POA: Diagnosis present

## 2014-08-20 ENCOUNTER — Encounter (HOSPITAL_COMMUNITY): Payer: Self-pay | Admitting: *Deleted

## 2014-08-20 ENCOUNTER — Inpatient Hospital Stay (HOSPITAL_COMMUNITY)
Admission: AD | Admit: 2014-08-20 | Discharge: 2014-08-20 | Disposition: A | Discharge status: Home / Self Care | Payer: Medicaid Other | Source: Ambulatory Visit | Attending: Obstetrics and Gynecology | Admitting: Obstetrics and Gynecology

## 2014-08-20 DIAGNOSIS — O99330 Smoking (tobacco) complicating pregnancy, unspecified trimester: Secondary | ICD-10-CM | POA: Diagnosis not present

## 2014-08-20 DIAGNOSIS — R109 Unspecified abdominal pain: Secondary | ICD-10-CM | POA: Diagnosis present

## 2014-08-20 DIAGNOSIS — N390 Urinary tract infection, site not specified: Secondary | ICD-10-CM

## 2014-08-20 DIAGNOSIS — O9989 Other specified diseases and conditions complicating pregnancy, childbirth and the puerperium: Principal | ICD-10-CM

## 2014-08-20 DIAGNOSIS — O99891 Other specified diseases and conditions complicating pregnancy: Secondary | ICD-10-CM | POA: Insufficient documentation

## 2014-08-20 LAB — URINALYSIS, ROUTINE W REFLEX MICROSCOPIC
Bilirubin Urine: NEGATIVE
GLUCOSE, UA: NEGATIVE mg/dL
HGB URINE DIPSTICK: NEGATIVE
KETONES UR: 15 mg/dL — AB
Nitrite: NEGATIVE
Protein, ur: NEGATIVE mg/dL
Specific Gravity, Urine: 1.015 (ref 1.005–1.030)
Urobilinogen, UA: 1 mg/dL (ref 0.0–1.0)
pH: 7 (ref 5.0–8.0)

## 2014-08-20 LAB — WET PREP, GENITAL
Clue Cells Wet Prep HPF POC: NONE SEEN
TRICH WET PREP: NONE SEEN
Yeast Wet Prep HPF POC: NONE SEEN

## 2014-08-20 LAB — URINE MICROSCOPIC-ADD ON

## 2014-08-20 MED ORDER — NITROFURANTOIN MONOHYD MACRO 100 MG PO CAPS
100.0000 mg | ORAL_CAPSULE | Freq: Once | ORAL | Status: AC
  Administered 2014-08-20: 100 mg via ORAL
  Filled 2014-08-20: qty 1

## 2014-08-20 NOTE — Discharge Instructions (Signed)

## 2014-08-20 NOTE — MAU Note (Signed)
Prescription called in for pt. To [facility] on [address]

## 2014-08-20 NOTE — MAU Provider Note (Signed)
Peggy [name] is a 23 y.o. G2P1 at 23.3 weeks presents to MAU c/o cramping x 2 days.  She denies vb, lof, pain with voiding.  +FM.  "Pt state it feel like when I had a bladder infection before".  FOB at the bedside   History     Patient Active Problem List   Diagnosis Date Noted  . Asthma with acute exacerbation 07/25/2014  . Smoker 07/25/2014  . Rubella non-immune status, antepartum 07/25/2014  . 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 Abdominal Cramping   HPI  OB History   Grav Para Term Preterm Abortions TAB SAB Ect Mult Living   2 1 1  0 0 0 0 0 0 1      Past Medical History  Diagnosis Date  . Asthma   . Pyloric stenosis     Past Surgical History  Procedure Laterality Date  . Pyloromyotomy      Family History  Problem Relation Age of Onset  . Arthritis Mother   . Cancer Mother     thyroid  . Depression Mother   . Hyperlipidemia Father   . Asthma Father   . Hypertension Father   . COPD Maternal Grandfather   . Diabetes Paternal Grandmother   . Arthritis Paternal Grandmother   . Asthma Paternal Grandmother   . Heart disease Paternal Grandmother   . Depression Paternal Grandmother   . Hypertension Paternal Grandmother   . Other Neg Hx     History  Substance Use Topics  . Smoking status: Former Smoker -- 0.25 packs/day    Types: Cigarettes    Quit date: 05/08/2012  . Smokeless tobacco: Never Used  . Alcohol Use: No    Allergies: No Known Allergies  Prescriptions prior to admission  Medication Sig Dispense Refill  . beclomethasone (QVAR) 40 MCG/ACT inhaler Inhale 1 puff into the lungs 2 (two) times daily. Start with 1 inhaled puff twice a day. May increase by 1 puff every twelve hours to a maximum of 4 puffs every 12 hours as needed.  1 Inhaler  12  . montelukast (SINGULAIR) 10 MG tablet Take 1 tablet (10 mg total) by mouth at bedtime.  90 tablet   1  . Prenatal Vit-Fe Fumarate-FA (PRENATAL MULTIVITAMIN) TABS tablet Take 1 tablet by mouth daily at 12 noon.        ROS See HPI above, all other systems are negative  Physical Exam   Blood pressure 119/63, pulse 101, temperature 99.3 F (37.4 C), temperature source Oral, resp. rate 18, height 5' (1.524 m), weight 156 lb 9.6 oz (71.033 kg), last menstrual period 06/27/2013, SpO2 100.00%.  Physical Exam  Ext:  WNL ABD: Soft, non tender to palpation, no rebound or guarding SVE: c/t/h   ED Course  Assessment: IUP at  23.3 weeks Membranes:intact FHR: reassuring CTX:  none minutes Possible UTI  Plan:  Labs: Wet prep, GC/CT, UA    Raiden Haydu, CNM, MSN 08/20/2014. 8:11 PM

## 2014-08-20 NOTE — MAU Provider Note (Signed)
Results for orders placed during the hospital encounter of 08/20/14 (from the past 24 hour(s))  URINALYSIS, ROUTINE W REFLEX MICROSCOPIC     Status: Abnormal   Collection Time    08/20/14  7:14 PM      Result Value Ref Range   Color, Urine YELLOW  YELLOW   APPearance HAZY (*) CLEAR   Specific Gravity, Urine 1.015  1.005 - 1.030   pH 7.0  5.0 - 8.0   Glucose, UA NEGATIVE  NEGATIVE mg/dL   Hgb urine dipstick NEGATIVE  NEGATIVE   Bilirubin Urine NEGATIVE  NEGATIVE   Ketones, ur 15 (*) NEGATIVE mg/dL   Protein, ur NEGATIVE  NEGATIVE mg/dL   Urobilinogen, UA 1.0  0.0 - 1.0 mg/dL   Nitrite NEGATIVE  NEGATIVE   Leukocytes, UA SMALL (*) NEGATIVE  URINE MICROSCOPIC-ADD ON     Status: Abnormal   Collection Time    08/20/14  7:14 PM      Result Value Ref Range   Squamous Epithelial / LPF FEW (*) RARE   WBC, UA 3-6  <3 WBC/hpf   RBC / HPF 0-2  <3 RBC/hpf   Bacteria, UA FEW (*) RARE   Urine-Other AMORPHOUS URATES/PHOSPHATES    WET PREP, GENITAL     Status: Abnormal   Collection Time    08/20/14  8:05 PM      Result Value Ref Range   Yeast Wet Prep HPF POC NONE SEEN  NONE SEEN   Trich, Wet Prep NONE SEEN  NONE SEEN   Clue Cells Wet Prep HPF POC NONE SEEN  NONE SEEN   WBC, Wet Prep HPF POC FEW (*) NONE SEEN    UTI Marcobid 100mg  bid x7 days DC to home  FU in office in 2 weeks    [employer], CNM, MSN 08/20/2014. 11:24 PM

## 2014-08-20 NOTE — MAU Note (Signed)
Lower abdominal/back cramping x 2 days. Happens several times per day. Denies LOF or vaginal bleeding. Positive fetal movement.

## 2014-08-23 LAB — GC/CHLAMYDIA PROBE AMP
CT Probe RNA: NEGATIVE
GC Probe RNA: NEGATIVE

## 2014-09-17 ENCOUNTER — Encounter (HOSPITAL_COMMUNITY): Payer: Self-pay | Admitting: *Deleted

## 2014-09-17 ENCOUNTER — Inpatient Hospital Stay (HOSPITAL_COMMUNITY)
Admission: AD | Admit: 2014-09-17 | Discharge: 2014-09-17 | Disposition: A | Discharge status: Home / Self Care | Payer: Medicaid Other | Source: Ambulatory Visit | Attending: Obstetrics | Admitting: Obstetrics

## 2014-09-17 DIAGNOSIS — O2390 Unspecified genitourinary tract infection in pregnancy, unspecified trimester: Secondary | ICD-10-CM | POA: Diagnosis not present

## 2014-09-17 DIAGNOSIS — Z87891 Personal history of nicotine dependence: Secondary | ICD-10-CM | POA: Diagnosis not present

## 2014-09-17 DIAGNOSIS — R109 Unspecified abdominal pain: Secondary | ICD-10-CM | POA: Diagnosis present

## 2014-09-17 DIAGNOSIS — N390 Urinary tract infection, site not specified: Secondary | ICD-10-CM | POA: Diagnosis not present

## 2014-09-17 DIAGNOSIS — O2342 Unspecified infection of urinary tract in pregnancy, second trimester: Secondary | ICD-10-CM

## 2014-09-17 LAB — URINE MICROSCOPIC-ADD ON

## 2014-09-17 LAB — URINALYSIS, ROUTINE W REFLEX MICROSCOPIC
BILIRUBIN URINE: NEGATIVE
Glucose, UA: NEGATIVE mg/dL
KETONES UR: 15 mg/dL — AB
Nitrite: NEGATIVE
PROTEIN: NEGATIVE mg/dL
Specific Gravity, Urine: 1.015 (ref 1.005–1.030)
Urobilinogen, UA: 0.2 mg/dL (ref 0.0–1.0)
pH: 8 (ref 5.0–8.0)

## 2014-09-17 LAB — WET PREP, GENITAL
Clue Cells Wet Prep HPF POC: NONE SEEN
Trich, Wet Prep: NONE SEEN
Yeast Wet Prep HPF POC: NONE SEEN

## 2014-09-17 LAB — OB RESULTS CONSOLE GC/CHLAMYDIA
CHLAMYDIA, DNA PROBE: NEGATIVE
GC PROBE AMP, GENITAL: NEGATIVE

## 2014-09-17 LAB — HIV ANTIBODY (ROUTINE TESTING W REFLEX): HIV 1&2 Ab, 4th Generation: NONREACTIVE

## 2014-09-17 LAB — RPR

## 2014-09-17 MED ORDER — NITROFURANTOIN MONOHYD MACRO 100 MG PO CAPS: 100.0000 mg | ORAL_CAPSULE | Freq: Two times a day (BID) | ORAL | Status: DC

## 2014-09-17 NOTE — MAU Provider Note (Signed)
History     CSN: [id number]  Arrival date and time: 09/17/14 1516   First Provider Initiated Contact with Patient 09/17/14 1623      Chief Complaint  Patient presents with  . Vaginal Discharge  . Abdominal Cramping   HPI Peggy [name] is a 23 y.o. G2P1001 at [redacted]w[redacted]d. She presents with cramping off/on x 1 wk, today her discharge was thick, white/yellow mucoid. She has nausea, has felt out of it the past week. No urinary changes, no other GI changes. + FM.     OB History   Grav Para Term Preterm Abortions TAB SAB Ect Mult Living   2 1 1  0 0 0 0 0 0 1      Past Medical History  Diagnosis Date  . Asthma   . Pyloric stenosis     Past Surgical History  Procedure Laterality Date  . Pyloromyotomy      Family History  Problem Relation Age of Onset  . Arthritis Mother   . Cancer Mother     thyroid  . Depression Mother   . Hyperlipidemia Father   . Asthma Father   . Hypertension Father   . COPD Maternal Grandfather   . Diabetes Paternal Grandmother   . Arthritis Paternal Grandmother   . Asthma Paternal Grandmother   . Heart disease Paternal Grandmother   . Depression Paternal Grandmother   . Hypertension Paternal Grandmother   . Other Neg Hx     History  Substance Use Topics  . Smoking status: Former Smoker -- 0.25 packs/day    Types: Cigarettes    Quit date: 05/08/2012  . Smokeless tobacco: Never Used  . Alcohol Use: No    Allergies: No Known Allergies  Prescriptions prior to admission  Medication Sig Dispense Refill  . beclomethasone (QVAR) 40 MCG/ACT inhaler Inhale 1 puff into the lungs 2 (two) times daily. Start with 1 inhaled puff twice a day. May increase by 1 puff every twelve hours to a maximum of 4 puffs every 12 hours as needed.  1 Inhaler  12  . Prenatal Vit-Fe Fumarate-FA (PRENATAL MULTIVITAMIN) TABS tablet Take 1 tablet by mouth daily at 12 noon.      . montelukast (SINGULAIR) 10 MG tablet Take 1 tablet (10 mg total) by mouth at bedtime.   90 tablet  1    Review of Systems  Constitutional: Positive for malaise/fatigue. Negative for fever and chills.  Gastrointestinal: Positive for nausea and abdominal pain. Negative for heartburn, vomiting, diarrhea and constipation.  Genitourinary: Negative for dysuria, urgency and frequency.   Physical Exam   Blood pressure 119/68, pulse 106, temperature 99 F (37.2 C), temperature source Oral, resp. rate 18, last menstrual period 06/27/2013.  Physical Exam  Nursing note and vitals reviewed. Constitutional: She is oriented to person, place, and time. She appears well-developed and well-nourished.  GI: Soft. There is no tenderness.  Genitourinary:  Pelvic exam: Ext gen-no anatomy, skin intact Vagina- small amt creamy white discharge Cx- closed, long, firm Uterus- gravid, non tender Adn- non tender  Musculoskeletal: Normal range of motion.  Neurological: She is alert and oriented to person, place, and time.  Skin: Skin is warm and dry.  Psychiatric: She has a normal mood and affect. Her behavior is normal.    MAU Course  Procedures  MDM Results for orders placed during the hospital encounter of 09/17/14 (from the past 24 hour(s))  URINALYSIS, ROUTINE W REFLEX MICROSCOPIC     Status: Abnormal   Collection Time  09/17/14  3:30 PM      Result Value Ref Range   Color, Urine YELLOW  YELLOW   APPearance CLEAR  CLEAR   Specific Gravity, Urine 1.015  1.005 - 1.030   pH 8.0  5.0 - 8.0   Glucose, UA NEGATIVE  NEGATIVE mg/dL   Hgb urine dipstick MODERATE (*) NEGATIVE   Bilirubin Urine NEGATIVE  NEGATIVE   Ketones, ur 15 (*) NEGATIVE mg/dL   Protein, ur NEGATIVE  NEGATIVE mg/dL   Urobilinogen, UA 0.2  0.0 - 1.0 mg/dL   Nitrite NEGATIVE  NEGATIVE   Leukocytes, UA TRACE (*) NEGATIVE  URINE MICROSCOPIC-ADD ON     Status: Abnormal   Collection Time    09/17/14  3:30 PM      Result Value Ref Range   Squamous Epithelial / LPF FEW (*) RARE   WBC, UA 3-6  <3 WBC/hpf   RBC / HPF  3-6  <3 RBC/hpf   Bacteria, UA FEW (*) RARE  WET PREP, GENITAL     Status: Abnormal   Collection Time    09/17/14  4:35 PM      Result Value Ref Range   Yeast Wet Prep HPF POC NONE SEEN  NONE SEEN   Trich, Wet Prep NONE SEEN  NONE SEEN   Clue Cells Wet Prep HPF POC NONE SEEN  NONE SEEN   WBC, Wet Prep HPF POC MODERATE (*) NONE SEEN     Assessment and Plan  UTI  Pregnancy @ 27 3/7 wks Reactive strip  Tx with Macrobid C&S pending Call if S&S don't resolve F/U in office as scheduled  Delbert Darley M. 09/17/2014, 4:33 PM

## 2014-09-17 NOTE — MAU Note (Addendum)
Pt stated she was at work and she had been having some cramping that got worse. Went to the [facility] and stated she had some thick white-yellow mucus that came out.  Pt stated that she has an anterior placenta so she does not feel fetal movement that much reports she is feeling movement.

## 2014-09-19 LAB — GC/CHLAMYDIA PROBE AMP
CT Probe RNA: NEGATIVE
GC Probe RNA: NEGATIVE

## 2014-09-28 ENCOUNTER — Encounter (HOSPITAL_COMMUNITY): Payer: Self-pay

## 2014-09-28 ENCOUNTER — Ambulatory Visit (HOSPITAL_COMMUNITY)
Admission: RE | Admit: 2014-09-28 | Discharge: 2014-09-28 | Disposition: A | Discharge status: Home / Self Care | Payer: Medicaid Other | Source: Ambulatory Visit | Attending: Certified Nurse Midwife | Admitting: Certified Nurse Midwife

## 2014-09-28 VITALS — BP 139/76 | HR 93 | Wt 160.0 lb

## 2014-09-28 DIAGNOSIS — O358XX0 Maternal care for other (suspected) fetal abnormality and damage, not applicable or unspecified: Secondary | ICD-10-CM | POA: Insufficient documentation

## 2014-09-28 DIAGNOSIS — O99330 Smoking (tobacco) complicating pregnancy, unspecified trimester: Secondary | ICD-10-CM | POA: Diagnosis not present

## 2014-09-28 DIAGNOSIS — IMO0002 Reserved for concepts with insufficient information to code with codable children: Secondary | ICD-10-CM

## 2014-09-28 DIAGNOSIS — F17200 Nicotine dependence, unspecified, uncomplicated: Secondary | ICD-10-CM

## 2014-10-20 ENCOUNTER — Other Ambulatory Visit (HOSPITAL_COMMUNITY): Payer: Self-pay | Admitting: Maternal and Fetal Medicine

## 2014-10-20 DIAGNOSIS — O359XX1 Maternal care for (suspected) fetal abnormality and damage, unspecified, fetus 1: Secondary | ICD-10-CM

## 2014-10-26 ENCOUNTER — Ambulatory Visit (HOSPITAL_COMMUNITY)
Admission: RE | Admit: 2014-10-26 | Discharge: 2014-10-26 | Disposition: A | Discharge status: Home / Self Care | Payer: Medicaid Other | Source: Ambulatory Visit | Attending: Obstetrics | Admitting: Obstetrics

## 2014-10-26 ENCOUNTER — Other Ambulatory Visit (HOSPITAL_COMMUNITY): Payer: Self-pay | Admitting: Obstetrics

## 2014-10-26 ENCOUNTER — Encounter (HOSPITAL_COMMUNITY): Payer: Self-pay

## 2014-10-26 VITALS — BP 132/67 | HR 112 | Wt 160.8 lb

## 2014-10-26 DIAGNOSIS — Z3A33 33 weeks gestation of pregnancy: Secondary | ICD-10-CM

## 2014-10-26 DIAGNOSIS — O358XX0 Maternal care for other (suspected) fetal abnormality and damage, not applicable or unspecified: Secondary | ICD-10-CM | POA: Diagnosis not present

## 2014-10-26 DIAGNOSIS — O359XX1 Maternal care for (suspected) fetal abnormality and damage, unspecified, fetus 1: Secondary | ICD-10-CM

## 2014-10-26 DIAGNOSIS — O35EXX1 Maternal care for other (suspected) fetal abnormality and damage, fetal genitourinary anomalies, fetus 1: Secondary | ICD-10-CM

## 2014-10-26 DIAGNOSIS — O358XX1 Maternal care for other (suspected) fetal abnormality and damage, fetus 1: Secondary | ICD-10-CM

## 2014-10-26 DIAGNOSIS — Z3A36 36 weeks gestation of pregnancy: Secondary | ICD-10-CM

## 2014-10-26 DIAGNOSIS — Z3A34 34 weeks gestation of pregnancy: Secondary | ICD-10-CM

## 2014-10-31 ENCOUNTER — Encounter (HOSPITAL_COMMUNITY): Payer: Self-pay

## 2014-10-31 ENCOUNTER — Inpatient Hospital Stay (HOSPITAL_COMMUNITY)
Admission: AD | Admit: 2014-10-31 | Discharge: 2014-10-31 | Disposition: A | Discharge status: Home / Self Care | Payer: Medicaid Other | Source: Ambulatory Visit | Attending: Obstetrics | Admitting: Obstetrics

## 2014-10-31 DIAGNOSIS — Z87891 Personal history of nicotine dependence: Secondary | ICD-10-CM | POA: Insufficient documentation

## 2014-10-31 DIAGNOSIS — Z3A33 33 weeks gestation of pregnancy: Secondary | ICD-10-CM | POA: Diagnosis not present

## 2014-10-31 DIAGNOSIS — O4703 False labor before 37 completed weeks of gestation, third trimester: Secondary | ICD-10-CM

## 2014-10-31 LAB — URINE MICROSCOPIC-ADD ON

## 2014-10-31 LAB — URINALYSIS, ROUTINE W REFLEX MICROSCOPIC
Bilirubin Urine: NEGATIVE
GLUCOSE, UA: NEGATIVE mg/dL
HGB URINE DIPSTICK: NEGATIVE
Ketones, ur: NEGATIVE mg/dL
Nitrite: NEGATIVE
Protein, ur: NEGATIVE mg/dL
Specific Gravity, Urine: 1.01 (ref 1.005–1.030)
Urobilinogen, UA: 0.2 mg/dL (ref 0.0–1.0)
pH: 6.5 (ref 5.0–8.0)

## 2014-10-31 MED ORDER — TERBUTALINE SULFATE 1 MG/ML IJ SOLN
0.2500 mg | Freq: Once | INTRAMUSCULAR | Status: AC
  Administered 2014-10-31: 0.25 mg via SUBCUTANEOUS
  Filled 2014-10-31: qty 1

## 2014-10-31 NOTE — Discharge Instructions (Signed)
Pelvic Rest °Pelvic rest is sometimes recommended for women when:  °· The placenta is partially or completely covering the opening of the cervix (placenta previa). °· There is bleeding between the uterine wall and the amniotic sac in the first trimester (subchorionic hemorrhage). °· The cervix begins to open without labor starting (incompetent cervix, cervical insufficiency). °· The labor is too early (preterm labor). °HOME CARE INSTRUCTIONS °· Do not have sexual intercourse, stimulation, or an orgasm. °· Do not use tampons, douche, or put anything in the vagina. °· Do not lift anything over 10 pounds (4.5 kg). °· Avoid strenuous activity or straining your pelvic muscles. °SEEK MEDICAL CARE IF:  °· You have any vaginal bleeding during pregnancy. Treat this as a potential emergency. °· You have cramping pain felt low in the stomach (stronger than menstrual cramps). °· You notice vaginal discharge (watery, mucus, or bloody). °· You have a low, dull backache. °· There are regular contractions or uterine tightening. °SEEK IMMEDIATE MEDICAL CARE IF: °You have vaginal bleeding and have placenta previa.  °Document Released: 04/12/2011 Document Revised: 03/09/2012 Document Reviewed: 04/12/2011 °ExitCare® Patient Information ©2015 [employer]. This information is not intended to replace advice given to you by your health care provider. Make sure you discuss any questions you have with your health care provider. ° °Preterm Labor Information °Preterm labor is when labor starts at less than 37 weeks of pregnancy. The normal length of a pregnancy is 39 to 41 weeks. °CAUSES °Often, there is no identifiable underlying cause as to why a woman goes into preterm labor. One of the most common known causes of preterm labor is infection. Infections of the uterus, cervix, vagina, amniotic sac, bladder, kidney, or even the lungs (pneumonia) can cause labor to start. Other suspected causes of preterm labor include:  °· Urogenital  infections, such as yeast infections and bacterial vaginosis.   °· Uterine abnormalities (uterine shape, uterine septum, fibroids, or bleeding from the placenta).   °· A cervix that has been operated on (it may fail to stay closed).   °· Malformations in the fetus.   °· Multiple gestations (twins, triplets, and so on).   °· Breakage of the amniotic sac.   °RISK FACTORS °· Having a previous history of preterm labor.   °· Having premature rupture of membranes (PROM).   °· Having a placenta that covers the opening of the cervix (placenta previa).   °· Having a placenta that separates from the uterus (placental abruption).   °· Having a cervix that is too weak to hold the fetus in the uterus (incompetent cervix).   °· Having too much fluid in the amniotic sac (polyhydramnios).   °· Taking illegal drugs or smoking while pregnant.   °· Not gaining enough weight while pregnant.   °· Being younger than 18 and older than 21 years old.   °· Having a low socioeconomic status.   °· Being African American. °SYMPTOMS °Signs and symptoms of preterm labor include:  °· Menstrual-like cramps, abdominal pain, or back pain. °· Uterine contractions that are regular, as frequent as six in an hour, regardless of their intensity (may be mild or painful). °· Contractions that start on the top of the uterus and spread down to the lower abdomen and back.   °· A sense of increased pelvic pressure.   °· A watery or bloody mucus discharge that comes from the vagina.   °TREATMENT °Depending on the length of the pregnancy and other circumstances, your health care provider may suggest bed rest. If necessary, there are medicines that can be given to stop contractions and to mature the   fetal lungs. If labor happens before 34 weeks of pregnancy, a prolonged hospital stay may be recommended. Treatment depends on the condition of both you and the fetus.  °WHAT SHOULD YOU DO IF YOU THINK YOU ARE IN PRETERM LABOR? °Call your health care provider right  away. You will need to go to the hospital to get checked immediately. °HOW CAN YOU PREVENT PRETERM LABOR IN FUTURE PREGNANCIES? °You should:  °· Stop smoking if you smoke.  °· Maintain healthy weight gain and avoid chemicals and drugs that are not necessary. °· Be watchful for any type of infection. °· Inform your health care provider if you have a known history of preterm labor. °Document Released: 03/07/2004 Document Revised: 08/18/2013 Document Reviewed: 01/18/2013 °ExitCare® Patient Information ©2015 [employer]. This information is not intended to replace advice given to you by your health care provider. Make sure you discuss any questions you have with your health care provider. ° °

## 2014-10-31 NOTE — MAU Provider Note (Signed)
History     CSN: [id number]  Arrival date and time: 10/31/14 1516   None     Chief Complaint  Patient presents with  . Abdominal Cramping   HPI This is a 23 y.o. female at [redacted]w[redacted]d who presents with c/o contractions which have become painful today. Has had some before but thought it was the baby moving. Denies leaking or bleeding. Had some PTL with last baby but delivered at term.   RN Note: Pt states since Saturday has had pains in lower back and abdomen, mostly r sided, however will move to both sides as well. Denies bleeding or lof.       OB History    Gravida Para Term Preterm AB TAB SAB Ectopic Multiple Living   2 1 1  0 0 0 0 0 0 1      Past Medical History  Diagnosis Date  . Asthma   . Pyloric stenosis     Past Surgical History  Procedure Laterality Date  . Pyloromyotomy      Family History  Problem Relation Age of Onset  . Arthritis Mother   . Cancer Mother     thyroid  . Depression Mother   . Hyperlipidemia Father   . Asthma Father   . Hypertension Father   . COPD Maternal Grandfather   . Diabetes Paternal Grandmother   . Arthritis Paternal Grandmother   . Asthma Paternal Grandmother   . Heart disease Paternal Grandmother   . Depression Paternal Grandmother   . Hypertension Paternal Grandmother   . Other Neg Hx     History  Substance Use Topics  . Smoking status: Former Smoker -- 0.25 packs/day    Types: Cigarettes    Quit date: 05/08/2012  . Smokeless tobacco: Never Used  . Alcohol Use: No    Allergies: No Known Allergies  Prescriptions prior to admission  Medication Sig Dispense Refill Last Dose  . Prenatal Vit-Fe Fumarate-FA (PRENATAL MULTIVITAMIN) TABS tablet Take 1 tablet by mouth daily at 12 noon.   10/31/2014 at 1100  . beclomethasone (QVAR) 40 MCG/ACT inhaler Inhale 1 puff into the lungs 2 (two) times daily. Start with 1 inhaled puff twice a day. May increase by 1 puff every twelve hours to a maximum of 4 puffs every 12 hours as  needed. (Patient not taking: Reported on 10/31/2014) 1 Inhaler 12 09/16/2014 at Unknown time  . montelukast (SINGULAIR) 10 MG tablet Take 1 tablet (10 mg total) by mouth at bedtime. (Patient not taking: Reported on 10/31/2014) 90 tablet 1 three weeks  . nitrofurantoin, macrocrystal-monohydrate, (MACROBID) 100 MG capsule Take 1 capsule (100 mg total) by mouth 2 (two) times daily. (Patient not taking: Reported on 10/31/2014) 14 capsule 0     Review of Systems  Constitutional: Negative for fever, chills and malaise/fatigue.  Gastrointestinal: Positive for abdominal pain. Negative for nausea, vomiting, diarrhea and constipation.  Genitourinary: Negative for dysuria.  Neurological: Negative for dizziness and weakness.   Physical Exam   Blood pressure 122/70, pulse 95, temperature 98.6 F (37 C), temperature source Oral, resp. rate 16, last menstrual period 06/27/2013.  Physical Exam  Constitutional: She is oriented to person, place, and time. She appears well-developed and well-nourished. No distress.  HENT:  Head: Normocephalic.  Cardiovascular: Normal rate.   Respiratory: Effort normal.  GI: Soft. She exhibits no distension and no mass. There is no tenderness. There is no rebound and no guarding.  Genitourinary: Vagina normal. No vaginal discharge found.  Cervix closed/40%/-3/vertex  Musculoskeletal: Normal range of motion.  Neurological: She is alert and oriented to person, place, and time.  Skin: Skin is warm and dry.  Psychiatric: She has a normal mood and affect.   FHR reactive Irregular contractions every 4-8 minutes, some every 2 min MAU Course  Procedures  MDM Terbutaline given per order Dr Ruthann Cancer UCs stopped after med  Assessment and Plan  A:  SIUP at [redacted]w[redacted]d       Preterm contractions with no change in cervix  P:  Discharge home       followup in office       PTL precautions  [facility] 10/31/2014, 4:51 PM

## 2014-10-31 NOTE — MAU Note (Signed)
Pt presents to MAU with complaints of lower abdominal cramping throughout the day. Denies any vaginal bleeding or LOF

## 2014-10-31 NOTE — MAU Note (Signed)
Pt states since Saturday has had pains in lower back and abdomen, mostly r sided, however will move to both sides as well. Denies bleeding or lof.

## 2014-11-01 ENCOUNTER — Other Ambulatory Visit (HOSPITAL_COMMUNITY): Payer: Self-pay | Admitting: Obstetrics

## 2014-11-01 ENCOUNTER — Ambulatory Visit (HOSPITAL_COMMUNITY)
Admission: RE | Admit: 2014-11-01 | Discharge: 2014-11-01 | Disposition: A | Discharge status: Home / Self Care | Payer: Medicaid Other | Source: Ambulatory Visit | Attending: Obstetrics | Admitting: Obstetrics

## 2014-11-01 ENCOUNTER — Encounter (HOSPITAL_COMMUNITY): Payer: Self-pay

## 2014-11-01 ENCOUNTER — Encounter (HOSPITAL_COMMUNITY): Payer: Self-pay | Admitting: *Deleted

## 2014-11-01 ENCOUNTER — Inpatient Hospital Stay (HOSPITAL_COMMUNITY)
Admission: AD | Admit: 2014-11-01 | Discharge: 2014-11-01 | Disposition: A | Discharge status: Home / Self Care | Payer: Medicaid Other | Source: Ambulatory Visit | Attending: Obstetrics | Admitting: Obstetrics

## 2014-11-01 DIAGNOSIS — Z87891 Personal history of nicotine dependence: Secondary | ICD-10-CM | POA: Insufficient documentation

## 2014-11-01 DIAGNOSIS — Z3A33 33 weeks gestation of pregnancy: Secondary | ICD-10-CM | POA: Diagnosis not present

## 2014-11-01 DIAGNOSIS — O35EXX1 Maternal care for other (suspected) fetal abnormality and damage, fetal genitourinary anomalies, fetus 1: Secondary | ICD-10-CM

## 2014-11-01 DIAGNOSIS — O368130 Decreased fetal movements, third trimester, not applicable or unspecified: Secondary | ICD-10-CM | POA: Insufficient documentation

## 2014-11-01 DIAGNOSIS — O358XX1 Maternal care for other (suspected) fetal abnormality and damage, fetus 1: Secondary | ICD-10-CM

## 2014-11-01 DIAGNOSIS — Z3A34 34 weeks gestation of pregnancy: Secondary | ICD-10-CM

## 2014-11-01 DIAGNOSIS — O479 False labor, unspecified: Secondary | ICD-10-CM

## 2014-11-01 DIAGNOSIS — O4703 False labor before 37 completed weeks of gestation, third trimester: Secondary | ICD-10-CM

## 2014-11-01 DIAGNOSIS — O359XX1 Maternal care for (suspected) fetal abnormality and damage, unspecified, fetus 1: Secondary | ICD-10-CM

## 2014-11-01 DIAGNOSIS — Z3689 Encounter for other specified antenatal screening: Secondary | ICD-10-CM

## 2014-11-01 DIAGNOSIS — O359XX0 Maternal care for (suspected) fetal abnormality and damage, unspecified, not applicable or unspecified: Secondary | ICD-10-CM | POA: Insufficient documentation

## 2014-11-01 NOTE — Progress Notes (Signed)
Pt states she has been 'extremely nauseous and has had horrible acid reflux since Saturday 10/29/14

## 2014-11-01 NOTE — MAU Note (Signed)
Pt states she was sent over for additional monitoring, pt states she also contracting

## 2014-11-01 NOTE — MAU Provider Note (Signed)
History     CSN: [id number]  Arrival date and time: 11/01/14 1518   First Provider Initiated Contact with Patient 11/01/14 1617      Chief Complaint  Patient presents with  . Decreased Fetal Movement   HPI  Pt is a 23 yo G2P1001 at [redacted]w[redacted]d wks IUP sent from [facility]FM for decreased fetal movment and BPP 6/10 (2 off for NST, 2 off for fetal breathing).  + contractions that started 10/29/14.  Seen in MAU on 10/31/14, cervix FT.  Given terbutaline and discharged to home.  Reports feeling contractions every 3-4 minutes.  Denies vaginal bleeding or leaking of fluid.    Past Medical History  Diagnosis Date  . Asthma   . Pyloric stenosis     Past Surgical History  Procedure Laterality Date  . Pyloromyotomy      Family History  Problem Relation Age of Onset  . Arthritis Mother   . Cancer Mother     thyroid  . Depression Mother   . Hyperlipidemia Father   . Asthma Father   . Hypertension Father   . COPD Maternal Grandfather   . Diabetes Paternal Grandmother   . Arthritis Paternal Grandmother   . Asthma Paternal Grandmother   . Heart disease Paternal Grandmother   . Depression Paternal Grandmother   . Hypertension Paternal Grandmother   . Other Neg Hx     History  Substance Use Topics  . Smoking status: Former Smoker -- 0.25 packs/day    Types: Cigarettes    Quit date: 05/08/2012  . Smokeless tobacco: Never Used  . Alcohol Use: No    Allergies: No Known Allergies  Prescriptions prior to admission  Medication Sig Dispense Refill Last Dose  . albuterol (PROVENTIL HFA;VENTOLIN HFA) 108 (90 BASE) MCG/ACT inhaler Inhale 2 puffs into the lungs every 6 (six) hours as needed for wheezing or shortness of breath.   Past Week at Unknown time  . Prenatal Vit-Fe Fumarate-FA (PRENATAL MULTIVITAMIN) TABS tablet Take 1 tablet by mouth daily at 12 noon.   Past Month at Unknown time  . beclomethasone (QVAR) 40 MCG/ACT inhaler Inhale 1 puff into the lungs 2 (two) times daily. Start with 1  inhaled puff twice a day. May increase by 1 puff every twelve hours to a maximum of 4 puffs every 12 hours as needed. (Patient not taking: Reported on 10/31/2014) 1 Inhaler 12 Not Taking  . montelukast (SINGULAIR) 10 MG tablet Take 1 tablet (10 mg total) by mouth at bedtime. (Patient not taking: Reported on 10/31/2014) 90 tablet 1 Not Taking  . nitrofurantoin, macrocrystal-monohydrate, (MACROBID) 100 MG capsule Take 1 capsule (100 mg total) by mouth 2 (two) times daily. (Patient not taking: Reported on 10/31/2014) 14 capsule 0 Not Taking    Review of Systems  Constitutional:       Decreased fetal movement  Gastrointestinal: Positive for abdominal pain. Diarrhea: intermittent contractions.  All other systems reviewed and are negative.  Physical Exam   Pulse 86, temperature 98.3 F (36.8 C), temperature source Oral, resp. rate 16, last menstrual period 06/27/2013.  Physical Exam  Constitutional: She is oriented to person, place, and time. She appears well-developed and well-nourished. No distress.  HENT:  Head: Normocephalic.  Neck: Normal range of motion. Neck supple.  Cardiovascular: Normal rate, regular rhythm and normal heart sounds.   Respiratory: Effort normal and breath sounds normal.  GI: Soft. There is no tenderness.  Genitourinary: No bleeding in the vagina. Vaginal discharge (mucusy) found.  Neurological: She is  alert and oriented to person, place, and time.  Skin: Skin is warm and dry.   Dilation: Fingertip Effacement (%): Thick Cervical Position: Posterior Exam by:: Reina Fuse, CNM  MAU Course  Procedures FHR 130's, +accels, no decels Toco - 2-8  [id number] Consulted with Dr. Ruthann Cancer > reviewed fetal monitoring strip/contractions and cervical exam (no change from 10/31/14 > discharge to home  Assessment and Plan  23 yo G2P1001 at [redacted]w[redacted]d wks IUP Category I FHR Tracing Preterm Contractions - No cervical change  Plan: Discharge to home Keep scheduled appointment with  [facility] Preterm labor precautions  Gwen Pounds 11/01/2014, 4:18 PM

## 2014-11-01 NOTE — MAU Note (Signed)
Urine in lab 

## 2014-11-01 NOTE — ED Notes (Signed)
Report called to MAU  

## 2014-11-04 ENCOUNTER — Other Ambulatory Visit (HOSPITAL_COMMUNITY): Payer: Self-pay | Admitting: Obstetrics

## 2014-11-04 ENCOUNTER — Ambulatory Visit (HOSPITAL_COMMUNITY)
Admission: RE | Admit: 2014-11-04 | Discharge: 2014-11-04 | Disposition: A | Discharge status: Home / Self Care | Payer: Medicaid Other | Source: Ambulatory Visit | Attending: Obstetrics | Admitting: Obstetrics

## 2014-11-04 ENCOUNTER — Other Ambulatory Visit (HOSPITAL_COMMUNITY): Payer: Self-pay | Admitting: Maternal and Fetal Medicine

## 2014-11-04 ENCOUNTER — Encounter (HOSPITAL_COMMUNITY): Payer: Self-pay | Admitting: *Deleted

## 2014-11-04 ENCOUNTER — Encounter (HOSPITAL_COMMUNITY): Payer: Self-pay

## 2014-11-04 ENCOUNTER — Inpatient Hospital Stay (HOSPITAL_COMMUNITY)
Admission: AD | Admit: 2014-11-04 | Discharge: 2014-11-04 | Disposition: A | Discharge status: Home / Self Care | Payer: Medicaid Other | Source: Ambulatory Visit | Attending: Obstetrics | Admitting: Obstetrics

## 2014-11-04 DIAGNOSIS — O358XX0 Maternal care for other (suspected) fetal abnormality and damage, not applicable or unspecified: Secondary | ICD-10-CM | POA: Insufficient documentation

## 2014-11-04 DIAGNOSIS — O359XX1 Maternal care for (suspected) fetal abnormality and damage, unspecified, fetus 1: Secondary | ICD-10-CM

## 2014-11-04 DIAGNOSIS — O358XX1 Maternal care for other (suspected) fetal abnormality and damage, fetus 1: Secondary | ICD-10-CM

## 2014-11-04 DIAGNOSIS — Z3A34 34 weeks gestation of pregnancy: Secondary | ICD-10-CM | POA: Insufficient documentation

## 2014-11-04 DIAGNOSIS — O35EXX1 Maternal care for other (suspected) fetal abnormality and damage, fetal genitourinary anomalies, fetus 1: Secondary | ICD-10-CM

## 2014-11-04 DIAGNOSIS — O289 Unspecified abnormal findings on antenatal screening of mother: Secondary | ICD-10-CM | POA: Diagnosis not present

## 2014-11-04 DIAGNOSIS — O288 Other abnormal findings on antenatal screening of mother: Secondary | ICD-10-CM

## 2014-11-04 DIAGNOSIS — Z3A36 36 weeks gestation of pregnancy: Secondary | ICD-10-CM

## 2014-11-04 NOTE — Discharge Instructions (Signed)
Preterm Labor Information  Preterm labor is when labor starts at less than 37 weeks of pregnancy. The normal length of a pregnancy is 39 to 41 weeks.  CAUSES  Often, there is no identifiable underlying cause as to why a woman goes into preterm labor. One of the most common known causes of preterm labor is infection. Infections of the uterus, cervix, vagina, amniotic sac, bladder, kidney, or even the lungs (pneumonia) can cause labor to start. Other suspected causes of preterm labor include:  Urogenital infections, such as yeast infections and bacterial vaginosis.  Uterine abnormalities (uterine shape, uterine septum, fibroids, or bleeding from the placenta).  A cervix that has been operated on (it may fail to stay closed).  Malformations in the fetus.  Multiple gestations (twins, triplets, and so on).  Breakage of the amniotic sac.  RISK FACTORS  Having a previous history of preterm labor.  Having premature rupture of membranes (PROM).  Having a placenta that covers the opening of the cervix (placenta previa).  Having a placenta that separates from the uterus (placental abruption).  Having a cervix that is too weak to hold the fetus in the uterus (incompetent cervix).  Having too much fluid in the amniotic sac (polyhydramnios).  Taking illegal drugs or smoking while pregnant.  Not gaining enough weight while pregnant.  Being younger than 74 and older than 21 years old.  Having a low socioeconomic status.  Being African American. SYMPTOMS  Signs and symptoms of preterm labor include:  Menstrual-like cramps, abdominal pain, or back pain.  Uterine contractions that are regular, as frequent as six in an hour, regardless of their intensity (may be mild or painful).  Contractions that start on the top of the uterus and spread down to the lower abdomen and back.  A sense of increased pelvic pressure.  A watery or bloody mucus discharge that comes from the vagina.  TREATMENT  Depending on the  length of the pregnancy and other circumstances, your health care provider may suggest bed rest. If necessary, there are medicines that can be given to stop contractions and to mature the fetal lungs. If labor happens before 34 weeks of pregnancy, a prolonged hospital stay may be recommended. Treatment depends on the condition of both you and the fetus.  WHAT SHOULD YOU DO IF YOU THINK YOU ARE IN PRETERM LABOR?  Call your health care provider right away. You will need to go to the hospital to get checked immediately.  HOW CAN YOU PREVENT PRETERM LABOR IN FUTURE PREGNANCIES?  You should:  Stop smoking if you smoke.  Maintain healthy weight gain and avoid chemicals and drugs that are not necessary.  Be watchful for any type of infection.  Inform your health care provider if you have a known history of preterm labor.

## 2014-11-04 NOTE — MAU Note (Signed)
Sent from MFM, non-reactive non-stress test, BPP 4/8.  Irregular contractions, no bleeding or leaking.  Pt for CST

## 2014-11-08 ENCOUNTER — Encounter (HOSPITAL_COMMUNITY): Payer: Self-pay

## 2014-11-08 ENCOUNTER — Other Ambulatory Visit (HOSPITAL_COMMUNITY): Payer: Self-pay | Admitting: Obstetrics

## 2014-11-08 ENCOUNTER — Ambulatory Visit (HOSPITAL_COMMUNITY)
Admission: RE | Admit: 2014-11-08 | Discharge: 2014-11-08 | Disposition: A | Discharge status: Home / Self Care | Payer: Medicaid Other | Source: Ambulatory Visit | Attending: Obstetrics | Admitting: Obstetrics

## 2014-11-08 DIAGNOSIS — O289 Unspecified abnormal findings on antenatal screening of mother: Secondary | ICD-10-CM | POA: Insufficient documentation

## 2014-11-08 DIAGNOSIS — O358XX1 Maternal care for other (suspected) fetal abnormality and damage, fetus 1: Secondary | ICD-10-CM

## 2014-11-08 DIAGNOSIS — O359XX0 Maternal care for (suspected) fetal abnormality and damage, unspecified, not applicable or unspecified: Secondary | ICD-10-CM | POA: Diagnosis not present

## 2014-11-08 DIAGNOSIS — Z3A36 36 weeks gestation of pregnancy: Secondary | ICD-10-CM

## 2014-11-08 DIAGNOSIS — O35EXX1 Maternal care for other (suspected) fetal abnormality and damage, fetal genitourinary anomalies, fetus 1: Secondary | ICD-10-CM

## 2014-11-08 DIAGNOSIS — O288 Other abnormal findings on antenatal screening of mother: Secondary | ICD-10-CM

## 2014-11-08 DIAGNOSIS — O359XX1 Maternal care for (suspected) fetal abnormality and damage, unspecified, fetus 1: Secondary | ICD-10-CM

## 2014-11-10 ENCOUNTER — Encounter (HOSPITAL_COMMUNITY): Payer: Self-pay

## 2014-11-11 ENCOUNTER — Ambulatory Visit (HOSPITAL_COMMUNITY): Payer: Medicaid Other

## 2014-11-11 ENCOUNTER — Other Ambulatory Visit (HOSPITAL_COMMUNITY): Payer: Medicaid Other

## 2014-11-11 ENCOUNTER — Other Ambulatory Visit (HOSPITAL_COMMUNITY): Payer: Self-pay | Admitting: Obstetrics

## 2014-11-11 ENCOUNTER — Ambulatory Visit (HOSPITAL_COMMUNITY): Admission: RE | Admit: 2014-11-11 | Payer: Medicaid Other | Source: Ambulatory Visit

## 2014-11-11 ENCOUNTER — Ambulatory Visit (HOSPITAL_COMMUNITY)
Admission: RE | Admit: 2014-11-11 | Discharge: 2014-11-11 | Disposition: A | Discharge status: Home / Self Care | Payer: Medicaid Other | Source: Ambulatory Visit | Attending: Obstetrics | Admitting: Obstetrics

## 2014-11-11 DIAGNOSIS — Z3A35 35 weeks gestation of pregnancy: Secondary | ICD-10-CM | POA: Insufficient documentation

## 2014-11-11 DIAGNOSIS — O35EXX1 Maternal care for other (suspected) fetal abnormality and damage, fetal genitourinary anomalies, fetus 1: Secondary | ICD-10-CM

## 2014-11-11 DIAGNOSIS — O35EXX0 Maternal care for other (suspected) fetal abnormality and damage, fetal genitourinary anomalies, not applicable or unspecified: Secondary | ICD-10-CM

## 2014-11-11 DIAGNOSIS — O358XX0 Maternal care for other (suspected) fetal abnormality and damage, not applicable or unspecified: Secondary | ICD-10-CM | POA: Insufficient documentation

## 2014-11-11 DIAGNOSIS — O359XX1 Maternal care for (suspected) fetal abnormality and damage, unspecified, fetus 1: Secondary | ICD-10-CM

## 2014-11-11 DIAGNOSIS — Z3A36 36 weeks gestation of pregnancy: Secondary | ICD-10-CM

## 2014-11-11 DIAGNOSIS — Z36 Encounter for antenatal screening of mother: Secondary | ICD-10-CM | POA: Diagnosis not present

## 2014-11-11 DIAGNOSIS — O358XX1 Maternal care for other (suspected) fetal abnormality and damage, fetus 1: Secondary | ICD-10-CM

## 2014-11-16 ENCOUNTER — Other Ambulatory Visit (HOSPITAL_COMMUNITY): Payer: Self-pay | Admitting: Maternal and Fetal Medicine

## 2014-11-16 ENCOUNTER — Ambulatory Visit (HOSPITAL_COMMUNITY)
Admission: RE | Admit: 2014-11-16 | Discharge: 2014-11-16 | Disposition: A | Discharge status: Home / Self Care | Payer: Medicaid Other | Source: Ambulatory Visit | Attending: Obstetrics | Admitting: Obstetrics

## 2014-11-16 DIAGNOSIS — O358XX1 Maternal care for other (suspected) fetal abnormality and damage, fetus 1: Secondary | ICD-10-CM

## 2014-11-16 DIAGNOSIS — Z3A36 36 weeks gestation of pregnancy: Secondary | ICD-10-CM | POA: Insufficient documentation

## 2014-11-16 DIAGNOSIS — O368190 Decreased fetal movements, unspecified trimester, not applicable or unspecified: Secondary | ICD-10-CM | POA: Diagnosis not present

## 2014-11-16 DIAGNOSIS — O35EXX0 Maternal care for other (suspected) fetal abnormality and damage, fetal genitourinary anomalies, not applicable or unspecified: Secondary | ICD-10-CM

## 2014-11-16 DIAGNOSIS — O35EXX1 Maternal care for other (suspected) fetal abnormality and damage, fetal genitourinary anomalies, fetus 1: Secondary | ICD-10-CM

## 2014-11-16 DIAGNOSIS — Q614 Renal dysplasia: Secondary | ICD-10-CM | POA: Insufficient documentation

## 2014-11-16 DIAGNOSIS — O359XX1 Maternal care for (suspected) fetal abnormality and damage, unspecified, fetus 1: Secondary | ICD-10-CM

## 2014-11-16 DIAGNOSIS — O358XX0 Maternal care for other (suspected) fetal abnormality and damage, not applicable or unspecified: Secondary | ICD-10-CM

## 2014-11-23 ENCOUNTER — Other Ambulatory Visit (HOSPITAL_COMMUNITY): Payer: Self-pay | Admitting: Obstetrics and Gynecology

## 2014-11-23 ENCOUNTER — Encounter (HOSPITAL_COMMUNITY): Payer: Self-pay

## 2014-11-23 ENCOUNTER — Ambulatory Visit (HOSPITAL_COMMUNITY)
Admission: RE | Admit: 2014-11-23 | Discharge: 2014-11-23 | Disposition: A | Discharge status: Home / Self Care | Payer: Medicaid Other | Source: Ambulatory Visit | Attending: Obstetrics | Admitting: Obstetrics

## 2014-11-23 DIAGNOSIS — O358XX0 Maternal care for other (suspected) fetal abnormality and damage, not applicable or unspecified: Secondary | ICD-10-CM

## 2014-11-23 DIAGNOSIS — O35EXX0 Maternal care for other (suspected) fetal abnormality and damage, fetal genitourinary anomalies, not applicable or unspecified: Secondary | ICD-10-CM

## 2014-11-23 DIAGNOSIS — O368190 Decreased fetal movements, unspecified trimester, not applicable or unspecified: Secondary | ICD-10-CM | POA: Insufficient documentation

## 2014-11-25 ENCOUNTER — Inpatient Hospital Stay (HOSPITAL_COMMUNITY)
Admission: AD | Admit: 2014-11-25 | Discharge: 2014-11-25 | Disposition: A | Discharge status: Home / Self Care | Payer: Medicaid Other | Source: Ambulatory Visit | Attending: Obstetrics | Admitting: Obstetrics

## 2014-11-25 ENCOUNTER — Encounter (HOSPITAL_COMMUNITY): Payer: Self-pay | Admitting: *Deleted

## 2014-11-25 DIAGNOSIS — O471 False labor at or after 37 completed weeks of gestation: Secondary | ICD-10-CM | POA: Diagnosis not present

## 2014-11-25 DIAGNOSIS — O368130 Decreased fetal movements, third trimester, not applicable or unspecified: Secondary | ICD-10-CM | POA: Diagnosis not present

## 2014-11-25 DIAGNOSIS — Z3A37 37 weeks gestation of pregnancy: Secondary | ICD-10-CM | POA: Diagnosis not present

## 2014-11-25 DIAGNOSIS — O479 False labor, unspecified: Secondary | ICD-10-CM

## 2014-11-25 NOTE — MAU Note (Signed)
Woke up with some crazy contractions, have continued, getting more painful. No bleeding or leaking. Baby has not moved at all today.

## 2014-11-25 NOTE — Discharge Instructions (Signed)
Braxton Hicks Contractions °Contractions of the uterus can occur throughout pregnancy. Contractions are not always a sign that you are in labor.  °WHAT ARE BRAXTON HICKS CONTRACTIONS?  °Contractions that occur before labor are called Braxton Hicks contractions, or false labor. Toward the end of pregnancy (32-34 weeks), these contractions can develop more often and may become more forceful. This is not true labor because these contractions do not result in opening (dilatation) and thinning of the cervix. They are sometimes difficult to tell apart from true labor because these contractions can be forceful and people have different pain tolerances. You should not feel embarrassed if you go to the hospital with false labor. Sometimes, the only way to tell if you are in true labor is for your health care provider to look for changes in the cervix. °If there are no prenatal problems or other health problems associated with the pregnancy, it is completely safe to be sent home with false labor and await the onset of true labor. °HOW CAN YOU TELL THE DIFFERENCE BETWEEN TRUE AND FALSE LABOR? °False Labor °· The contractions of false labor are usually shorter and not as hard as those of true labor.   °· The contractions are usually irregular.   °· The contractions are often felt in the front of the lower abdomen and in the groin.   °· The contractions may go away when you walk around or change positions while lying down.   °· The contractions get weaker and are shorter lasting as time goes on.   °· The contractions do not usually become progressively stronger, regular, and closer together as with true labor.   °True Labor °1. Contractions in true labor last 30-70 seconds, become very regular, usually become more intense, and increase in frequency.   °2. The contractions do not go away with walking.   °3. The discomfort is usually felt in the top of the uterus and spreads to the lower abdomen and low back.   °4. True labor can  be determined by your health care provider with an exam. This will show that the cervix is dilating and getting thinner.   °WHAT TO REMEMBER °· Keep up with your usual exercises and follow other instructions given by your health care provider.   °· Take medicines as directed by your health care provider.   °· Keep your regular prenatal appointments.   °· Eat and drink lightly if you think you are going into labor.   °· If Braxton Hicks contractions are making you uncomfortable:   °· Change your position from lying down or resting to walking, or from walking to resting.   °· Sit and rest in a tub of warm water.   °· Drink 2-3 glasses of water. Dehydration may cause these contractions.   °· Do slow and deep breathing several times an hour.   °WHEN SHOULD I SEEK IMMEDIATE MEDICAL CARE? °Seek immediate medical care if: °· Your contractions become stronger, more regular, and closer together.   °· You have fluid leaking or gushing from your vagina.   °· You have a fever.   °· You pass blood-tinged mucus.   °· You have vaginal bleeding.   °· You have continuous abdominal pain.   °· You have low back pain that you never had before.   °· You feel your baby's head pushing down and causing pelvic pressure.   °· Your baby is not moving as much as it used to.   °Document Released: 12/16/2005 Document Revised: 12/21/2013 Document Reviewed: 09/27/2013 °[employer]® Patient Information ©2015 [employer]. This information is not intended to replace advice given to you by your health care   provider. Make sure you discuss any questions you have with your health care provider. ° °Fetal Movement Counts °Patient Name: __________________________________________________ Patient Due Date: ____________________ °Performing a fetal movement count is highly recommended in high-risk pregnancies, but it is good for every pregnant woman to do. Your health care provider may ask you to start counting fetal movements at 28 weeks of the pregnancy. Fetal  movements often increase: °· After eating a full meal. °· After physical activity. °· After eating or drinking something sweet or cold. °· At rest. °Pay attention to when you feel the baby is most active. This will help you notice a pattern of your baby's sleep and wake cycles and what factors contribute to an increase in fetal movement. It is important to perform a fetal movement count at the same time each day when your baby is normally most active.  °HOW TO COUNT FETAL MOVEMENTS °5. Find a quiet and comfortable area to sit or lie down on your left side. Lying on your left side provides the best blood and oxygen circulation to your baby. °6. Write down the day and time on a sheet of paper or in a journal. °7. Start counting kicks, flutters, swishes, rolls, or jabs in a 2-hour period. You should feel at least 10 movements within 2 hours. °8. If you do not feel 10 movements in 2 hours, wait 2-3 hours and count again. Look for a change in the pattern or not enough counts in 2 hours. °SEEK MEDICAL CARE IF: °· You feel less than 10 counts in 2 hours, tried twice. °· There is no movement in over an hour. °· The pattern is changing or taking longer each day to reach 10 counts in 2 hours. °· You feel the baby is not moving as he or she usually does. °Date: ____________ Movements: ____________ Start time: ____________ Finish time: ____________  °Date: ____________ Movements: ____________ Start time: ____________ Finish time: ____________ °Date: ____________ Movements: ____________ Start time: ____________ Finish time: ____________ °Date: ____________ Movements: ____________ Start time: ____________ Finish time: ____________ °Date: ____________ Movements: ____________ Start time: ____________ Finish time: ____________ °Date: ____________ Movements: ____________ Start time: ____________ Finish time: ____________ °Date: ____________ Movements: ____________ Start time: ____________ Finish time: ____________ °Date: ____________  Movements: ____________ Start time: ____________ Finish time: ____________  °Date: ____________ Movements: ____________ Start time: ____________ Finish time: ____________ °Date: ____________ Movements: ____________ Start time: ____________ Finish time: ____________ °Date: ____________ Movements: ____________ Start time: ____________ Finish time: ____________ °Date: ____________ Movements: ____________ Start time: ____________ Finish time: ____________ °Date: ____________ Movements: ____________ Start time: ____________ Finish time: ____________ °Date: ____________ Movements: ____________ Start time: ____________ Finish time: ____________ °Date: ____________ Movements: ____________ Start time: ____________ Finish time: ____________  °Date: ____________ Movements: ____________ Start time: ____________ Finish time: ____________ °Date: ____________ Movements: ____________ Start time: ____________ Finish time: ____________ °Date: ____________ Movements: ____________ Start time: ____________ Finish time: ____________ °Date: ____________ Movements: ____________ Start time: ____________ Finish time: ____________ °Date: ____________ Movements: ____________ Start time: ____________ Finish time: ____________ °Date: ____________ Movements: ____________ Start time: ____________ Finish time: ____________ °Date: ____________ Movements: ____________ Start time: ____________ Finish time: ____________  °Date: ____________ Movements: ____________ Start time: ____________ Finish time: ____________ °Date: ____________ Movements: ____________ Start time: ____________ Finish time: ____________ °Date: ____________ Movements: ____________ Start time: ____________ Finish time: ____________ °Date: ____________ Movements: ____________ Start time: ____________ Finish time: ____________ °Date: ____________ Movements: ____________ Start time: ____________ Finish time: ____________ °Date: ____________ Movements: ____________ Start time:  ____________ Finish time: ____________ °Date: ____________ Movements:   ____________ Start time: ____________ Finish time: ____________  °Date: ____________ Movements: ____________ Start time: ____________ Finish time: ____________ °Date: ____________ Movements: ____________ Start time: ____________ Finish time: ____________ °Date: ____________ Movements: ____________ Start time: ____________ Finish time: ____________ °Date: ____________ Movements: ____________ Start time: ____________ Finish time: ____________ °Date: ____________ Movements: ____________ Start time: ____________ Finish time: ____________ °Date: ____________ Movements: ____________ Start time: ____________ Finish time: ____________ °Date: ____________ Movements: ____________ Start time: ____________ Finish time: ____________  °Date: ____________ Movements: ____________ Start time: ____________ Finish time: ____________ °Date: ____________ Movements: ____________ Start time: ____________ Finish time: ____________ °Date: ____________ Movements: ____________ Start time: ____________ Finish time: ____________ °Date: ____________ Movements: ____________ Start time: ____________ Finish time: ____________ °Date: ____________ Movements: ____________ Start time: ____________ Finish time: ____________ °Date: ____________ Movements: ____________ Start time: ____________ Finish time: ____________ °Date: ____________ Movements: ____________ Start time: ____________ Finish time: ____________  °Date: ____________ Movements: ____________ Start time: ____________ Finish time: ____________ °Date: ____________ Movements: ____________ Start time: ____________ Finish time: ____________ °Date: ____________ Movements: ____________ Start time: ____________ Finish time: ____________ °Date: ____________ Movements: ____________ Start time: ____________ Finish time: ____________ °Date: ____________ Movements: ____________ Start time: ____________ Finish time: ____________ °Date:  ____________ Movements: ____________ Start time: ____________ Finish time: ____________ °Date: ____________ Movements: ____________ Start time: ____________ Finish time: ____________  °Date: ____________ Movements: ____________ Start time: ____________ Finish time: ____________ °Date: ____________ Movements: ____________ Start time: ____________ Finish time: ____________ °Date: ____________ Movements: ____________ Start time: ____________ Finish time: ____________ °Date: ____________ Movements: ____________ Start time: ____________ Finish time: ____________ °Date: ____________ Movements: ____________ Start time: ____________ Finish time: ____________ °Date: ____________ Movements: ____________ Start time: ____________ Finish time: ____________ °Document Released: 01/15/2007 Document Revised: 05/02/2014 Document Reviewed: 10/12/2012 °[employer]® Patient Information ©2015 [employer]. This information is not intended to replace advice given to you by your health care provider. Make sure you discuss any questions you have with your health care provider. ° °

## 2014-11-30 ENCOUNTER — Encounter (HOSPITAL_COMMUNITY): Payer: Self-pay | Admitting: *Deleted

## 2014-11-30 ENCOUNTER — Ambulatory Visit (HOSPITAL_COMMUNITY)
Admission: RE | Admit: 2014-11-30 | Discharge: 2014-11-30 | Disposition: A | Discharge status: Home / Self Care | Payer: Medicaid Other | Source: Ambulatory Visit | Attending: Obstetrics | Admitting: Obstetrics

## 2014-11-30 ENCOUNTER — Inpatient Hospital Stay (HOSPITAL_COMMUNITY)
Admission: AD | Admit: 2014-11-30 | Discharge: 2014-11-30 | Disposition: A | Discharge status: Home / Self Care | Payer: Medicaid Other | Source: Ambulatory Visit | Attending: Obstetrics | Admitting: Obstetrics

## 2014-11-30 DIAGNOSIS — Z3A39 39 weeks gestation of pregnancy: Secondary | ICD-10-CM

## 2014-11-30 DIAGNOSIS — O368130 Decreased fetal movements, third trimester, not applicable or unspecified: Secondary | ICD-10-CM | POA: Diagnosis not present

## 2014-11-30 DIAGNOSIS — O471 False labor at or after 37 completed weeks of gestation: Secondary | ICD-10-CM | POA: Diagnosis not present

## 2014-11-30 DIAGNOSIS — Z3A38 38 weeks gestation of pregnancy: Secondary | ICD-10-CM | POA: Diagnosis not present

## 2014-11-30 DIAGNOSIS — O479 False labor, unspecified: Secondary | ICD-10-CM

## 2014-11-30 DIAGNOSIS — Z3689 Encounter for other specified antenatal screening: Secondary | ICD-10-CM

## 2014-11-30 DIAGNOSIS — O35EXX0 Maternal care for other (suspected) fetal abnormality and damage, fetal genitourinary anomalies, not applicable or unspecified: Secondary | ICD-10-CM

## 2014-11-30 DIAGNOSIS — O358XX0 Maternal care for other (suspected) fetal abnormality and damage, not applicable or unspecified: Secondary | ICD-10-CM

## 2014-11-30 NOTE — MAU Note (Signed)
Sent from u/s department for NST;

## 2014-11-30 NOTE — MAU Note (Signed)
Pt has been having weekly BPPs; BPP today is 6/8 and pt was sent to MAU for NST;

## 2014-11-30 NOTE — MAU Provider Note (Signed)
Subjective:  [name] is a 23 y.o. female G2P1001 at [redacted]w[redacted]d who presents from MFM for an NST. She has been followed by MFM for weekly NST for abnormal fetal movements.  Patient got a 6/8 BPP today; off for movement. She was sent here for an NST  Dr. Ruthann Cancer aware that patient is coming to MAU for NST   Objective :  GENERAL: Well-developed, well-nourished female in no acute distress.  HEENT: Normocephalic, atraumatic.   SKIN: Warm, dry and without erythema PSYCH: Normal mood and affect  Filed Vitals:   11/30/14 1546  BP: 116/72  Pulse: 85  Temp: 98.1 F (36.7 C)  Resp: 16     Fetal Tracing: Baseline: 140 bpm Variability: Moderate  Accelerations: 15x15 Decelerations: None Toco: Occasional contraction with UI   MDM: Consulted with Dr. Ruthann Cancer at [phone]; ok to discharge home.     Assessment: 1. Braxton Hick's contraction   2. NST (non-stress test) reactive     Plan: Discharge home in stable condition Follow up with Dr. Ruthann Cancer as scheduled  Kick counts   [place], NP 11/30/2014 4:09 PM

## 2014-12-01 DIAGNOSIS — Z3A38 38 weeks gestation of pregnancy: Secondary | ICD-10-CM | POA: Insufficient documentation

## 2014-12-09 ENCOUNTER — Inpatient Hospital Stay (HOSPITAL_COMMUNITY)
Admission: AD | Admit: 2014-12-09 | Discharge: 2014-12-12 | DRG: 775 | Disposition: A | Discharge status: Home / Self Care | Payer: Medicaid Other | Source: Ambulatory Visit | Attending: Obstetrics | Admitting: Obstetrics

## 2014-12-09 ENCOUNTER — Inpatient Hospital Stay (HOSPITAL_COMMUNITY): Payer: Medicaid Other | Admitting: Anesthesiology

## 2014-12-09 ENCOUNTER — Encounter (HOSPITAL_COMMUNITY): Payer: Self-pay

## 2014-12-09 DIAGNOSIS — O9952 Diseases of the respiratory system complicating childbirth: Secondary | ICD-10-CM | POA: Diagnosis present

## 2014-12-09 DIAGNOSIS — Z3A39 39 weeks gestation of pregnancy: Secondary | ICD-10-CM | POA: Diagnosis present

## 2014-12-09 DIAGNOSIS — J45909 Unspecified asthma, uncomplicated: Secondary | ICD-10-CM | POA: Diagnosis present

## 2014-12-09 DIAGNOSIS — Z3483 Encounter for supervision of other normal pregnancy, third trimester: Secondary | ICD-10-CM | POA: Diagnosis present

## 2014-12-09 LAB — GROUP B STREP BY PCR: GROUP B STREP BY PCR: NEGATIVE

## 2014-12-09 LAB — CBC
HCT: 34.4 % — ABNORMAL LOW (ref 36.0–46.0)
Hemoglobin: 11.2 g/dL — ABNORMAL LOW (ref 12.0–15.0)
MCH: 27.3 pg (ref 26.0–34.0)
MCHC: 32.6 g/dL (ref 30.0–36.0)
MCV: 83.9 fL (ref 78.0–100.0)
PLATELETS: 363 10*3/uL (ref 150–400)
RBC: 4.1 MIL/uL (ref 3.87–5.11)
RDW: 14.1 % (ref 11.5–15.5)
WBC: 11.8 10*3/uL — AB (ref 4.0–10.5)

## 2014-12-09 LAB — TYPE AND SCREEN
ABO/RH(D): A POS
Antibody Screen: NEGATIVE

## 2014-12-09 LAB — OB RESULTS CONSOLE GBS: STREP GROUP B AG: NEGATIVE

## 2014-12-09 LAB — RPR

## 2014-12-09 LAB — RAPID HIV SCREEN (WH-MAU): SUDS RAPID HIV SCREEN: NONREACTIVE

## 2014-12-09 LAB — ABO/RH: ABO/RH(D): A POS

## 2014-12-09 MED ORDER — PHENYLEPHRINE 40 MCG/ML (10ML) SYRINGE FOR IV PUSH (FOR BLOOD PRESSURE SUPPORT): 80.0000 ug | PREFILLED_SYRINGE | INTRAVENOUS | Status: DC | PRN

## 2014-12-09 MED ORDER — FENTANYL 2.5 MCG/ML BUPIVACAINE 1/10 % EPIDURAL INFUSION (WH - ANES)
14.0000 mL/h | INTRAMUSCULAR | Status: DC | PRN
  Administered 2014-12-09: 14 mL/h via EPIDURAL
  Filled 2014-12-09: qty 125

## 2014-12-09 MED ORDER — ALBUTEROL SULFATE (2.5 MG/3ML) 0.083% IN NEBU
3.0000 mL | INHALATION_SOLUTION | Freq: Four times a day (QID) | RESPIRATORY_TRACT | Status: DC | PRN
  Administered 2014-12-09: 3 mL via RESPIRATORY_TRACT
  Filled 2014-12-09: qty 3

## 2014-12-09 MED ORDER — EPHEDRINE 5 MG/ML INJ: 10.0000 mg | INTRAVENOUS | Status: DC | PRN

## 2014-12-09 MED ORDER — OXYTOCIN BOLUS FROM INFUSION: 500.0000 mL | INTRAVENOUS | Status: DC

## 2014-12-09 MED ORDER — DIPHENHYDRAMINE HCL 50 MG/ML IJ SOLN: 12.5000 mg | INTRAMUSCULAR | Status: DC | PRN

## 2014-12-09 MED ORDER — OXYTOCIN 40 UNITS IN LACTATED RINGERS INFUSION - SIMPLE MED
1.0000 m[IU]/min | INTRAVENOUS | Status: DC
  Filled 2014-12-09: qty 1000

## 2014-12-09 MED ORDER — TERBUTALINE SULFATE 1 MG/ML IJ SOLN: 0.2500 mg | Freq: Once | INTRAMUSCULAR | Status: AC | PRN

## 2014-12-09 MED ORDER — LACTATED RINGERS IV SOLN
500.0000 mL | Freq: Once | INTRAVENOUS | Status: AC
  Administered 2014-12-09: 500 mL via INTRAVENOUS

## 2014-12-09 MED ORDER — LACTATED RINGERS IV SOLN: 500.0000 mL | INTRAVENOUS | Status: DC | PRN

## 2014-12-09 MED ORDER — PHENYLEPHRINE 40 MCG/ML (10ML) SYRINGE FOR IV PUSH (FOR BLOOD PRESSURE SUPPORT)
80.0000 ug | PREFILLED_SYRINGE | INTRAVENOUS | Status: DC | PRN
  Filled 2014-12-09: qty 10

## 2014-12-09 MED ORDER — ALBUTEROL SULFATE HFA 108 (90 BASE) MCG/ACT IN AERS: 2.0000 | INHALATION_SPRAY | Freq: Four times a day (QID) | RESPIRATORY_TRACT | Status: DC | PRN

## 2014-12-09 MED ORDER — LIDOCAINE HCL (PF) 1 % IJ SOLN
30.0000 mL | INTRAMUSCULAR | Status: DC | PRN
  Filled 2014-12-09: qty 30

## 2014-12-09 MED ORDER — CITRIC ACID-SODIUM CITRATE 334-500 MG/5ML PO SOLN: 30.0000 mL | ORAL | Status: DC | PRN

## 2014-12-09 MED ORDER — ONDANSETRON HCL 4 MG/2ML IJ SOLN
4.0000 mg | Freq: Four times a day (QID) | INTRAMUSCULAR | Status: DC | PRN
Start: 2014-12-09 — End: 2014-12-10

## 2014-12-09 MED ORDER — OXYCODONE-ACETAMINOPHEN 5-325 MG PO TABS: 1.0000 | ORAL_TABLET | ORAL | Status: DC | PRN

## 2014-12-09 MED ORDER — LACTATED RINGERS IV SOLN
INTRAVENOUS | Status: DC
  Administered 2014-12-09: 21:00:00 via INTRAVENOUS

## 2014-12-09 MED ORDER — BUTORPHANOL TARTRATE 1 MG/ML IJ SOLN: 1.0000 mg | INTRAMUSCULAR | Status: DC | PRN

## 2014-12-09 MED ORDER — ALBUTEROL SULFATE (2.5 MG/3ML) 0.083% IN NEBU: 2.5000 mg | INHALATION_SOLUTION | Freq: Four times a day (QID) | RESPIRATORY_TRACT | Status: DC | PRN

## 2014-12-09 MED ORDER — ACETAMINOPHEN 325 MG PO TABS: 650.0000 mg | ORAL_TABLET | ORAL | Status: DC | PRN

## 2014-12-09 MED ORDER — OXYTOCIN 40 UNITS IN LACTATED RINGERS INFUSION - SIMPLE MED: 62.5000 mL/h | INTRAVENOUS | Status: DC

## 2014-12-09 MED ORDER — OXYCODONE-ACETAMINOPHEN 5-325 MG PO TABS: 2.0000 | ORAL_TABLET | ORAL | Status: DC | PRN

## 2014-12-09 NOTE — MAU Note (Signed)
Pt states irreg ctx's, SROM aT 0715, clear fluid. Was 2cm in office Wednesday.

## 2014-12-09 NOTE — MAU Note (Signed)
Pt upset about long wait for bed in BS. Pt sitting up in bed and unwilling to lay back for fetal monitoring due to discomfort

## 2014-12-09 NOTE — H&P (Signed)
Peggy [name] is a 23 y.o. female presenting for SROM. Maternal Medical History:  Reason for admission: Rupture of membranes.   Fetal activity: Perceived fetal activity is normal.   Last perceived fetal movement was within the past hour.    Prenatal Complications - Diabetes: none.    OB History    Gravida Para Term Preterm AB TAB SAB Ectopic Multiple Living   2 1 1  0 0 0 0 0 0 1     Past Medical History  Diagnosis Date  . Asthma   . Pyloric stenosis    Past Surgical History  Procedure Laterality Date  . Pyloromyotomy     Family History: family history includes Arthritis in her mother and paternal grandmother; Asthma in her father and paternal grandmother; COPD in her maternal grandfather; Cancer in her mother; Depression in her mother and paternal grandmother; Diabetes in her paternal grandmother; Heart disease in her paternal grandmother; Hyperlipidemia in her father; Hypertension in her father and paternal grandmother. There is no history of Other. Social History:  reports that she quit smoking about 2 years ago. Her smoking use included Cigarettes. She smoked 0.25 packs per day. She has never used smokeless tobacco. She reports that she does not drink alcohol or use illicit drugs.   Prenatal Transfer Tool  Maternal Diabetes: No Genetic Screening: Normal Maternal Ultrasounds/Referrals: Abnormal:  Findings:   Fetal Kidney Anomalies Fetal Ultrasounds or other Referrals:  Referred to Materal Fetal Medicine  Maternal Substance Abuse:  No Significant Maternal Medications:  None Significant Maternal Lab Results:  None Other Comments:  None  Review of Systems  All other systems reviewed and are negative.   Dilation: 2.5 Effacement (%): 40, 50 Station: -2 Exam by:: SBeck, RN Blood pressure 135/81, pulse 96, temperature 98.3 F (36.8 C), temperature source Oral, resp. rate 18, last menstrual period 06/27/2013. Maternal Exam:  Uterine Assessment: Contraction strength  is mild.  Contraction frequency is irregular.   Abdomen: Patient reports no abdominal tenderness. Fetal presentation: vertex  Introitus: Normal vulva. Normal vagina.  Pelvis: adequate for delivery.   Cervix: Cervix evaluated by digital exam.     Physical Exam  Nursing note and vitals reviewed. Constitutional: She is oriented to person, place, and time. She appears well-developed and well-nourished.  HENT:  Head: Normocephalic and atraumatic.  Eyes: Conjunctivae are normal. Pupils are equal, round, and reactive to light.  Neck: Normal range of motion. Neck supple.  Cardiovascular: Normal rate and regular rhythm.   Respiratory: Effort normal and breath sounds normal.  GI: Soft. Bowel sounds are normal.  Genitourinary: Vagina normal and uterus normal.  Musculoskeletal: Normal range of motion.  Neurological: She is alert and oriented to person, place, and time.  Skin: Skin is warm and dry.  Psychiatric: She has a normal mood and affect. Her behavior is normal. Judgment and thought content normal.    Prenatal labs: ABO, Rh:   Antibody:   Rubella:   RPR: NON REAC (09/19 1658)  HBsAg:    HIV: NONREACTIVE (09/19 1658)  GBS:     Assessment/Plan: 39.2 weeks.  SROM.  Admit.   Peggy [name] A 12/09/2014, 2:13 PM

## 2014-12-09 NOTE — H&P (Signed)
This is Dr. Gracy Racer dictating the history and physical on  Peggy [name] she's a 23 year old gravida 2 para 1001 at 67 weeks and 2 days EDC 12/14/2014 her GBS is negative her membranes ruptured 7:30 AM today and she is having irregular contractions cervix 2 cm vertex -2 station the fluids clear patient has been followed by MFM because of decreased fetal movement from 30 weeks incision gets weekly nonstress tests and they've all been normal her ultrasound shows that she has a right multicystic dysplastic fetal kidney so beta to be evaluated by the urologist upon birth Past medical history negative Past surgical history negative Social history negative System review noncontributory Physical exam well-developed female in no distress HEENT negative Lungs clear to P&A Heart regular rhythm no murmurs or gallops Breasts negative Abdomen term Pelvic as described above Extremities negative

## 2014-12-09 NOTE — MAU Note (Signed)
To room 160 at 1520

## 2014-12-09 NOTE — Anesthesia Preprocedure Evaluation (Signed)
Anesthesia Evaluation  Patient identified by MRN, date of birth, ID band Patient awake    Reviewed: Allergy & Precautions, H&P , NPO status , Patient's Chart, lab work & pertinent test results  Airway Mallampati: III  TM Distance: >3 FB Neck ROM: Full    Dental no notable dental hx. (+) Teeth Intact   Pulmonary asthma , former smoker,  breath sounds clear to auscultation  Pulmonary exam normal       Cardiovascular negative cardio ROS  Rhythm:Regular Rate:Normal     Neuro/Psych negative neurological ROS  negative psych ROS   GI/Hepatic Neg liver ROS, GERD-  Medicated and Controlled,  Endo/Other  negative endocrine ROSObesity  Renal/GU Renal disease  negative genitourinary   Musculoskeletal negative musculoskeletal ROS (+)   Abdominal (+) + obese,   Peds  Hematology  (+) anemia ,   Anesthesia Other Findings   Reproductive/Obstetrics (+) Pregnancy                             Anesthesia Physical Anesthesia Plan  ASA: II  Anesthesia Plan: Epidural   Post-op Pain Management:    Induction:   Airway Management Planned: Natural Airway  Additional Equipment:   Intra-op Plan:   Post-operative Plan:   Informed Consent: I have reviewed the patients History and Physical, chart, labs and discussed the procedure including the risks, benefits and alternatives for the proposed anesthesia with the patient or authorized representative who has indicated his/her understanding and acceptance.     Plan Discussed with: Anesthesiologist  Anesthesia Plan Comments:         Anesthesia Quick Evaluation

## 2014-12-09 NOTE — Progress Notes (Signed)
Dr. Ruthann Cancer called MAU, RN requested albuterol inhaler as pt left hers at home. Order obtained, pt still waiting to go to birthing suites

## 2014-12-10 ENCOUNTER — Encounter (HOSPITAL_COMMUNITY): Payer: Self-pay | Admitting: Obstetrics

## 2014-12-10 LAB — CBC
HCT: 33.7 % — ABNORMAL LOW (ref 36.0–46.0)
HEMOGLOBIN: 10.9 g/dL — AB (ref 12.0–15.0)
MCH: 27.3 pg (ref 26.0–34.0)
MCHC: 32.3 g/dL (ref 30.0–36.0)
MCV: 84.3 fL (ref 78.0–100.0)
Platelets: 327 10*3/uL (ref 150–400)
RBC: 4 MIL/uL (ref 3.87–5.11)
RDW: 14.1 % (ref 11.5–15.5)
WBC: 16 10*3/uL — AB (ref 4.0–10.5)

## 2014-12-10 MED ORDER — LIDOCAINE HCL (PF) 1 % IJ SOLN
INTRAMUSCULAR | Status: DC | PRN
  Administered 2014-12-09 (×2): 4 mL

## 2014-12-10 MED ORDER — OXYTOCIN 40 UNITS IN LACTATED RINGERS INFUSION - SIMPLE MED: 62.5000 mL/h | INTRAVENOUS | Status: DC | PRN

## 2014-12-10 MED ORDER — ONDANSETRON HCL 4 MG/2ML IJ SOLN: 4.0000 mg | INTRAMUSCULAR | Status: DC | PRN

## 2014-12-10 MED ORDER — BENZOCAINE-MENTHOL 20-0.5 % EX AERO
1.0000 | INHALATION_SPRAY | CUTANEOUS | Status: DC | PRN
Start: 2014-12-10 — End: 2014-12-12
  Filled 2014-12-10: qty 56

## 2014-12-10 MED ORDER — PNEUMOCOCCAL VAC POLYVALENT 25 MCG/0.5ML IJ INJ
0.5000 mL | INJECTION | INTRAMUSCULAR | Status: AC
  Administered 2014-12-12: 0.5 mL via INTRAMUSCULAR
  Filled 2014-12-10 (×2): qty 0.5

## 2014-12-10 MED ORDER — SIMETHICONE 80 MG PO CHEW: 80.0000 mg | CHEWABLE_TABLET | ORAL | Status: DC | PRN

## 2014-12-10 MED ORDER — FENTANYL 2.5 MCG/ML BUPIVACAINE 1/10 % EPIDURAL INFUSION (WH - ANES)
INTRAMUSCULAR | Status: DC | PRN
  Administered 2014-12-09: 12 mL/h via EPIDURAL

## 2014-12-10 MED ORDER — SENNOSIDES-DOCUSATE SODIUM 8.6-50 MG PO TABS
2.0000 | ORAL_TABLET | ORAL | Status: DC
  Administered 2014-12-10 – 2014-12-11 (×3): 2 via ORAL
  Filled 2014-12-10 (×3): qty 2

## 2014-12-10 MED ORDER — INFLUENZA VAC SPLIT QUAD 0.5 ML IM SUSY
0.5000 mL | PREFILLED_SYRINGE | INTRAMUSCULAR | Status: AC
  Administered 2014-12-11: 0.5 mL via INTRAMUSCULAR

## 2014-12-10 MED ORDER — OXYCODONE-ACETAMINOPHEN 5-325 MG PO TABS
2.0000 | ORAL_TABLET | ORAL | Status: DC | PRN
  Administered 2014-12-11 – 2014-12-12 (×4): 2 via ORAL
  Filled 2014-12-10 (×3): qty 2

## 2014-12-10 MED ORDER — DIBUCAINE 1 % RE OINT
1.0000 "application " | TOPICAL_OINTMENT | RECTAL | Status: DC | PRN
  Filled 2014-12-10: qty 28

## 2014-12-10 MED ORDER — DIPHENHYDRAMINE HCL 25 MG PO CAPS: 25.0000 mg | ORAL_CAPSULE | Freq: Four times a day (QID) | ORAL | Status: DC | PRN

## 2014-12-10 MED ORDER — OXYCODONE-ACETAMINOPHEN 5-325 MG PO TABS
1.0000 | ORAL_TABLET | ORAL | Status: DC | PRN
  Administered 2014-12-10 (×4): 1 via ORAL
  Filled 2014-12-10 (×4): qty 1

## 2014-12-10 MED ORDER — ONDANSETRON HCL 4 MG PO TABS: 4.0000 mg | ORAL_TABLET | ORAL | Status: DC | PRN

## 2014-12-10 MED ORDER — LANOLIN HYDROUS EX OINT: TOPICAL_OINTMENT | CUTANEOUS | Status: DC | PRN

## 2014-12-10 MED ORDER — ZOLPIDEM TARTRATE 5 MG PO TABS
5.0000 mg | ORAL_TABLET | Freq: Every evening | ORAL | Status: DC | PRN
Start: 2014-12-10 — End: 2014-12-12

## 2014-12-10 MED ORDER — IBUPROFEN 600 MG PO TABS
600.0000 mg | ORAL_TABLET | Freq: Four times a day (QID) | ORAL | Status: DC
  Administered 2014-12-10 – 2014-12-12 (×10): 600 mg via ORAL
  Filled 2014-12-10 (×10): qty 1

## 2014-12-10 MED ORDER — TETANUS-DIPHTH-ACELL PERTUSSIS 5-2.5-18.5 LF-MCG/0.5 IM SUSP
0.5000 mL | Freq: Once | INTRAMUSCULAR | Status: AC
  Administered 2014-12-11: 0.5 mL via INTRAMUSCULAR

## 2014-12-10 MED ORDER — PRENATAL MULTIVITAMIN CH
1.0000 | ORAL_TABLET | Freq: Every day | ORAL | Status: DC
  Administered 2014-12-10 – 2014-12-12 (×3): 1 via ORAL
  Filled 2014-12-10 (×3): qty 1

## 2014-12-10 MED ORDER — WITCH HAZEL-GLYCERIN EX PADS: 1.0000 "application " | MEDICATED_PAD | CUTANEOUS | Status: DC | PRN

## 2014-12-10 NOTE — Progress Notes (Signed)
Peggy [name] is a 23 y.o. G2P1001 at [redacted]w[redacted]d by LMP admitted for rupture of membranes  Subjective:   Objective: BP 142/77 mmHg  Pulse 85  Temp(Src) 98.6 F (37 C) (Oral)  Resp 20  Ht 5' (1.524 m)  Wt 162 lb (73.483 kg)  BMI 31.64 kg/m2  SpO2 95%  LMP 06/27/2013   Total I/O In: -  Out: 350 [Blood:350]  FHT:  FHR: 130 bpm, variability: moderate,  accelerations:  Present,  decelerations:  Present variable UC:   regular, every 2-3 minutes SVE:   Dilation: 10 Effacement (%): 80, 90 Station: Crowning Exam by:: h stone rnc  Labs: Lab Results  Component Value Date   WBC 11.8* 12/09/2014   HGB 11.2* 12/09/2014   HCT 34.4* 12/09/2014   MCV 83.9 12/09/2014   PLT 363 12/09/2014    Assessment / Plan: Induction of labor due to rupture of membranes,  progressing well on pitocin  Labor: Progressing normally Preeclampsia:  n/a Fetal Wellbeing:  Category II Pain Control:  Epidural I/D:  n/a Anticipated MOD:  NSVD  Bejamin Hackbart A 12/10/2014, 1:59 AM

## 2014-12-10 NOTE — Anesthesia Postprocedure Evaluation (Signed)
  Anesthesia Post-op Note  Patient: Peggy [name]  Procedure(s) Performed: * No procedures listed *  Patient Location: Mother/Baby  Anesthesia Type:Epidural  Level of Consciousness: awake  Airway and Oxygen Therapy: Patient Spontanous Breathing  Post-op Pain: mild  Post-op Assessment: Patient's Cardiovascular Status Stable and Respiratory Function Stable  Post-op Vital Signs: stable  Last Vitals:  Filed Vitals:   12/10/14 1240  BP: 116/70  Pulse: 86  Temp: 36.7 C  Resp: 18    Complications: No apparent anesthesia complications

## 2014-12-10 NOTE — Lactation Note (Signed)
This note was copied from the chart of Peggy [name]. Lactation Consultation Note  Patient Name: Peggy [name] Today's Date: 12/10/2014 Reason for consult: Initial assessment Mom reports baby is not latching well. Mom reports nipple pain on the left breast with trying to nurse. Mom used nipple shield with last baby for 9 months. Nipples will become more erect with hand expression or pre-pumping. Baby sleepy at this visit but was able to get baby latched to left breast for approx 2 minutes, but Mom reported pain throughout the feeding. Mom had pre-pumped and received approx 2 ml of colostrum. Attempted to spoon feed this to baby but she spit up the EBM. On suck exam, baby is humping her tongue, biting/chewing. Encouraged Mom to pre-pump to help with latch. Since Mom is experienced with nipple shield and having nipple pain already, sized Mom for 16 nipple shield. Reviewed with Mom how to apply and latch. Advised Mom if baby not latching use nipple shield. Advised to BF with feeding ques, but if she does not observe feeding ques by 3 hours from last feeding, then place baby STS and see if she will wake to BF. Advised after 24 hours baby should be at the breast 8-12 times in 24 hours. Care for sore nipples reviewed, comfort gels given. Lactation brochure left for review, advised of OP services and support group. Encouraged Mom to call for assist as needed.   Maternal Data Has patient been taught Hand Expression?: Yes Does the patient have breastfeeding experience prior to this delivery?: Yes  Feeding Feeding Type: Breast Fed Length of feed: 2 min (off/on)  LATCH Score/Interventions Latch: Repeated attempts needed to sustain latch, nipple held in mouth throughout feeding, stimulation needed to elicit sucking reflex. Intervention(s): Adjust position;Assist with latch;Breast massage;Breast compression  Audible Swallowing: None  Type of Nipple: Everted at rest and after stimulation  (short shaft)  Comfort (Breast/Nipple): Filling, red/small blisters or bruises, mild/mod discomfort  Problem noted: Mild/Moderate discomfort Interventions (Mild/moderate discomfort): Comfort gels;Pre-pump if needed;Hand expression  Hold (Positioning): Assistance needed to correctly position infant at breast and maintain latch. Intervention(s): Breastfeeding basics reviewed;Support Pillows;Position options;Skin to skin  LATCH Score: 5  Lactation Tools Discussed/Used Tools: Pump;Nipple Shields Nipple shield size: 16;20 Breast pump type: Manual WIC Program: Yes   Consult Status Consult Status: Follow-up Date: 12/11/14 Follow-up type: In-patient    Peggy [name] 12/10/2014, 10:51 PM

## 2014-12-10 NOTE — Progress Notes (Signed)
Post Partum Day 0 Subjective: no complaints  Objective: Blood pressure 119/65, pulse 88, temperature 98.9 F (37.2 C), temperature source Oral, resp. rate 20, height 5' (1.524 m), weight 162 lb (73.483 kg), last menstrual period 06/27/2013, SpO2 95 %, unknown if currently breastfeeding.  Physical Exam:  General: alert and no distress Lochia: appropriate Uterine Fundus: firm Incision: none DVT Evaluation: No evidence of DVT seen on physical exam.   Recent Labs  12/09/14 1610 12/10/14 0540  HGB 11.2* 10.9*  HCT 34.4* 33.7*    Assessment/Plan: Doing well.  Routine.   LOS: 1 day   Peggy [name] 12/10/2014, 8:58 AM

## 2014-12-11 NOTE — Progress Notes (Signed)
Post Partum Day 1 Subjective: no complaints  Objective: Blood pressure 127/79, pulse 84, temperature 98.1 F (36.7 C), temperature source Oral, resp. rate 19, height 5' (1.524 m), weight 162 lb (73.483 kg), last menstrual period 06/27/2013, SpO2 97 %, unknown if currently breastfeeding.  Physical Exam:  General: alert and no distress Lochia: appropriate Uterine Fundus: firm Incision: none DVT Evaluation: No evidence of DVT seen on physical exam.   Recent Labs  12/09/14 1610 12/10/14 0540  HGB 11.2* 10.9*  HCT 34.4* 33.7*    Assessment/Plan: Plan for discharge tomorrow   LOS: 2 days   Ryder Man A 12/11/2014, 5:52 AM

## 2014-12-12 ENCOUNTER — Inpatient Hospital Stay (HOSPITAL_COMMUNITY): Payer: Medicaid Other

## 2014-12-12 MED ORDER — MEASLES, MUMPS & RUBELLA VAC ~~LOC~~ INJ
0.5000 mL | INJECTION | Freq: Once | SUBCUTANEOUS | Status: AC
  Administered 2014-12-12: 0.5 mL via SUBCUTANEOUS
  Filled 2014-12-12: qty 0.5

## 2014-12-12 NOTE — Discharge Summary (Signed)
Obstetric Discharge Summary Reason for Admission: rupture of membranes Prenatal Procedures: NST Intrapartum Procedures: spontaneous vaginal delivery Postpartum Procedures: none Complications-Operative and Postpartum: none HEMOGLOBIN  Date Value Ref Range Status  12/10/2014 10.9* 12.0 - 15.0 g/dL Final   HCT  Date Value Ref Range Status  12/10/2014 33.7* 36.0 - 46.0 % Final    Physical Exam:  General: alert Lochia: appropriate Uterine Fundus: firm Incision: healing well DVT Evaluation: No evidence of DVT seen on physical exam.  Discharge Diagnoses: Term Pregnancy-delivered  Discharge Information: Date: 12/12/2014 Activity: pelvic rest Diet: routine Medications: Percocet Condition: stable Instructions: refer to practice specific booklet Discharge to: home Follow-up Information    Follow up with Peggy Hamman, MD.   Specialty:  Obstetrics and Gynecology   Contact information:   [address] [place] [address] [phone]       Newborn Data: Live born female  Birth Weight: 6 lb 2.6 oz (2795 g) APGAR: 9, 9  Home with mother.  Peggy [name] 12/12/2014, 6:39 AM

## 2014-12-12 NOTE — Lactation Note (Signed)
This note was copied from the chart of Peggy Jordan. Lactation Consultation Note   Follow up consult with this mom of a term baby, now under 6 pounds but only at 4% weight loss, and 59 hours old. Mom did not use her 16 nipple shield during the night, but the baby ate every 1- 1 1/2 hours, and mom's nipples are more sore. On exam of baby's oral anatomy, I showed mom and dad how her lip frenulum extends to her gum line, and how her tongue with elevation makes a bowl shape, and the frenulum is mid posterior and short. I explained to mom how with these findings, using a nipple shield will protect her nipples and will also help the baby to transfer milk. Mom also was advised to use her DEP at home, and pump every 3 hours after breast feeding, and supplement the baby with EBM every 3 hours. The baby should be more satisfied this way. Mom was also advised to have her pediatrician aware of baby's oral anatomy. Mom knows to call for lactation help as Patient Name: Peggy [name] Today's Date: 12/12/2014     Maternal Data    Feeding Feeding Type: Breast Fed  LATCH Score/Interventions                      Lactation Tools Discussed/Used     Consult Status      Tonna Corner 12/12/2014, 12:43 PM

## 2014-12-12 NOTE — Discharge Instructions (Signed)
Discharge instructions   You can wash your hair  Shower  Eat what you want  Drink what you want  See me in 6 weeks  Your ankles are going to swell more in the next 2 weeks than when pregnant  No sex for 6 weeks   Oleda Borski A, MD 12/12/2014

## 2014-12-12 NOTE — Progress Notes (Signed)
UR chart review completed.  

## 2015-04-01 IMAGING — US US FETAL BPP W/O NONSTRESS
1 series · 13 of 13 positions shown · non-contrast
Comparison: none

[Series 1: us fetal bpp w/o nonstress · non-contrast · 13 acquisitions, 13 frames shown]
[im 1/13]
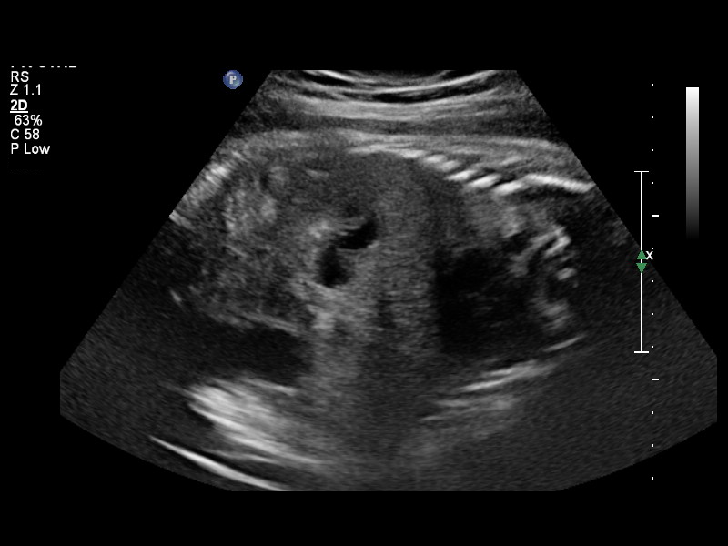
[im 2/13]
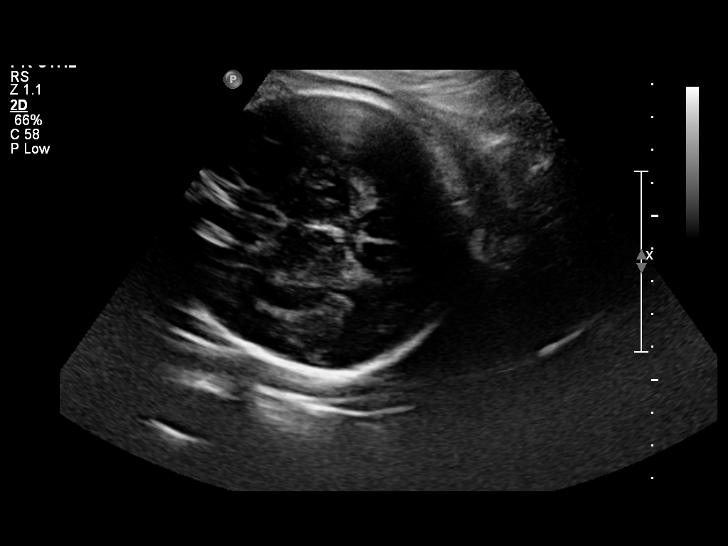
[im 3/13]
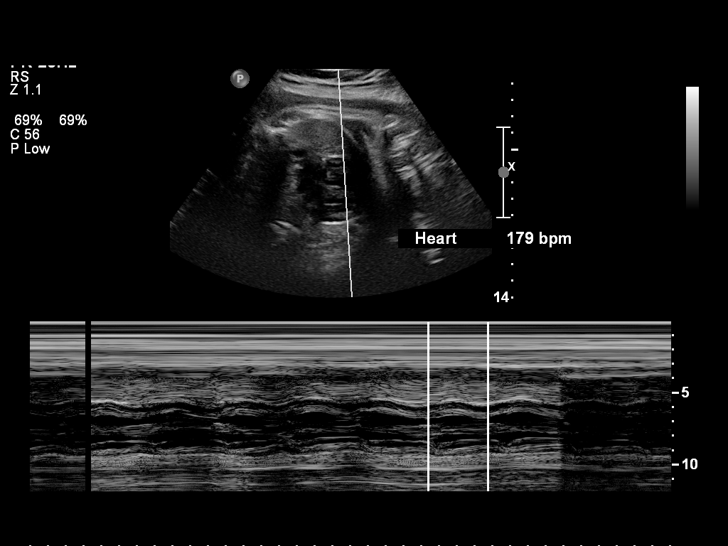
[im 4/13]
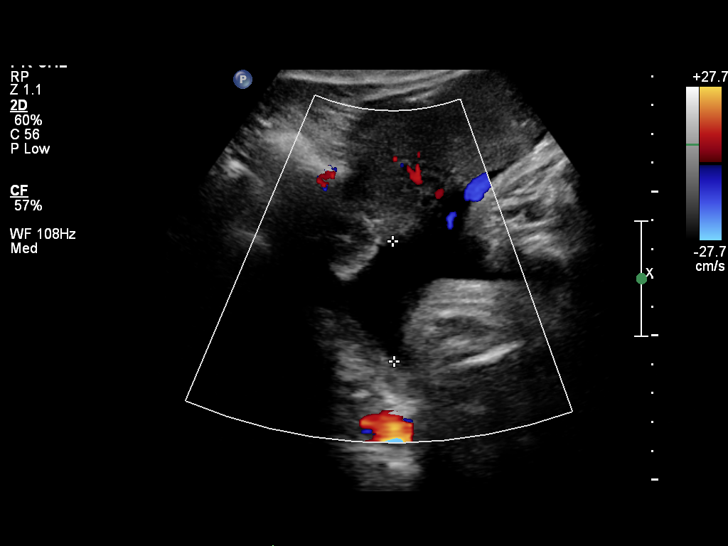
[im 5/13]
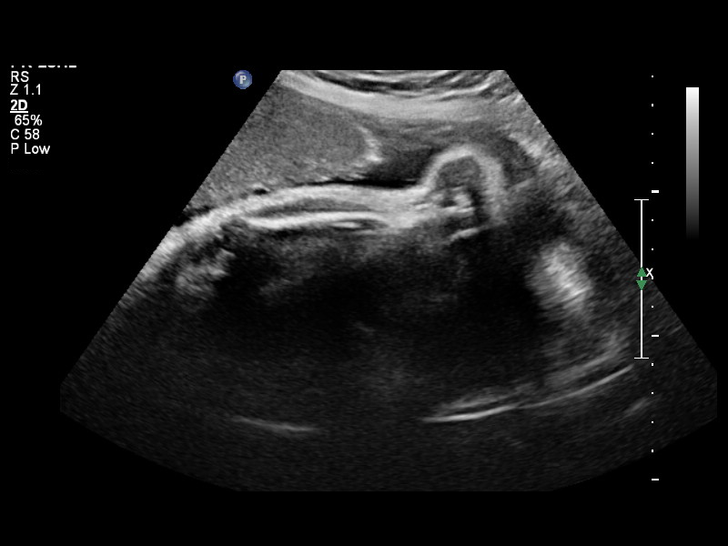
[im 6/13]
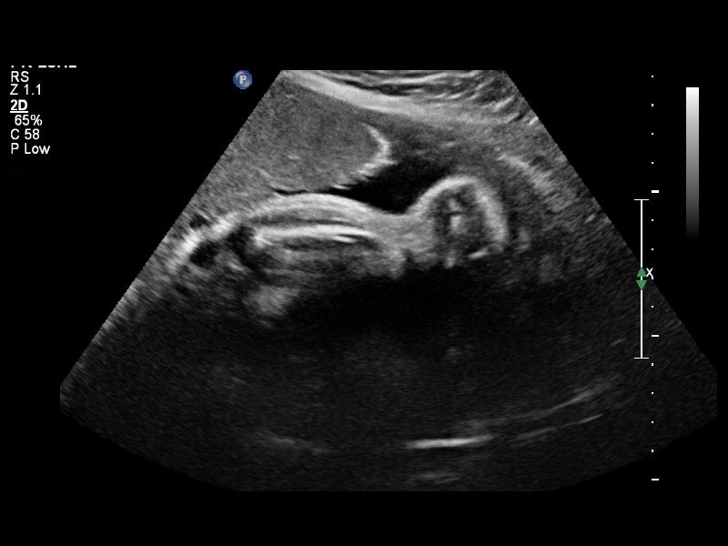
[im 7/13]
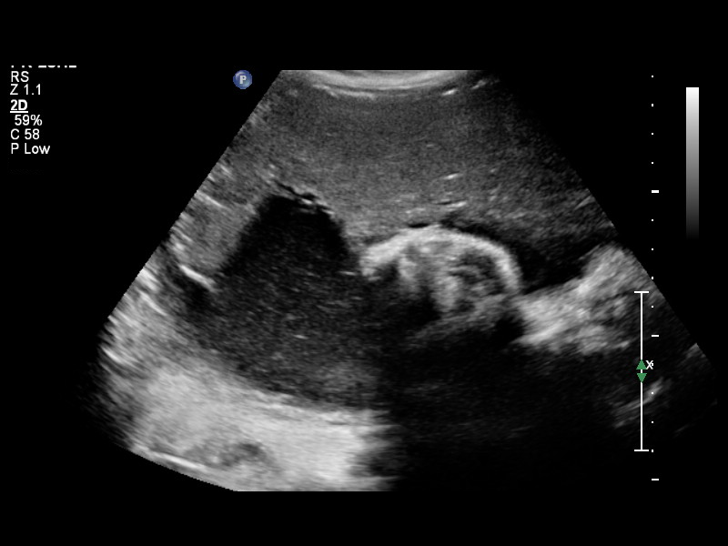
[im 8/13]
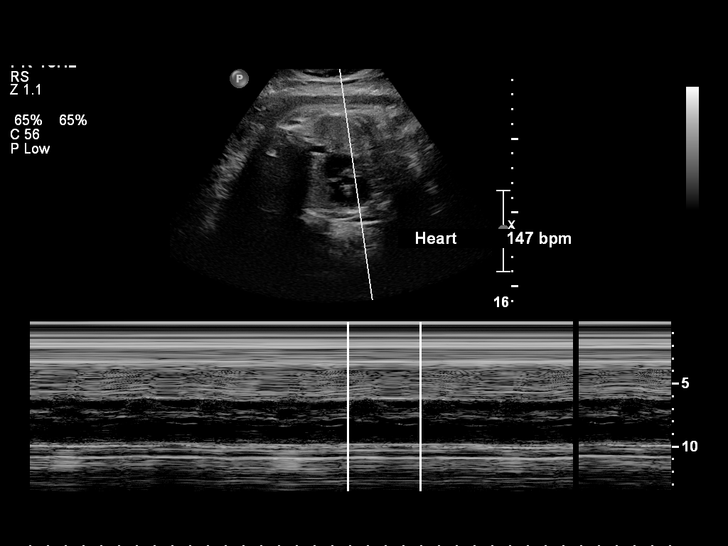
[im 9/13]
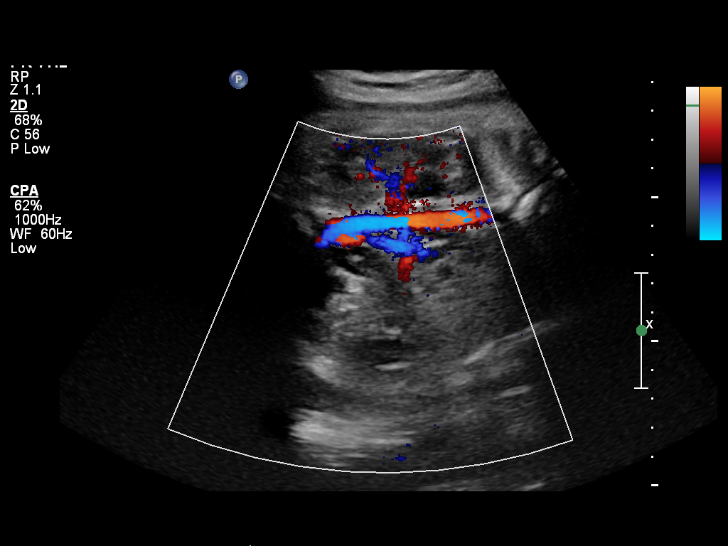
[im 10/13]
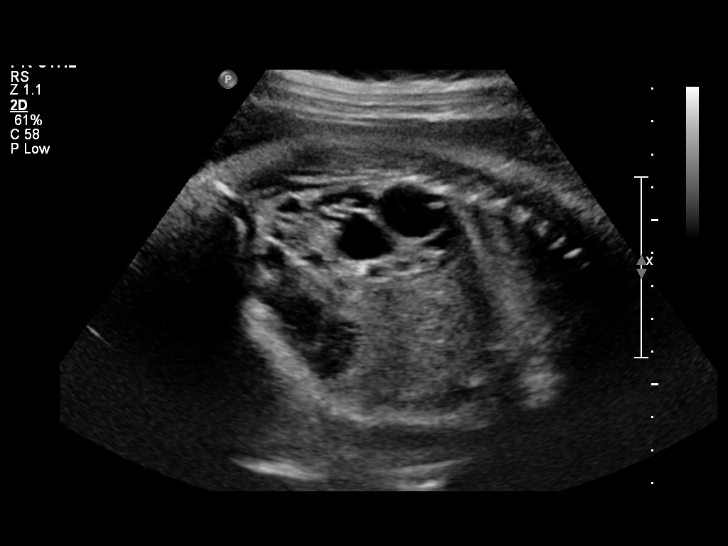
[im 11/13]
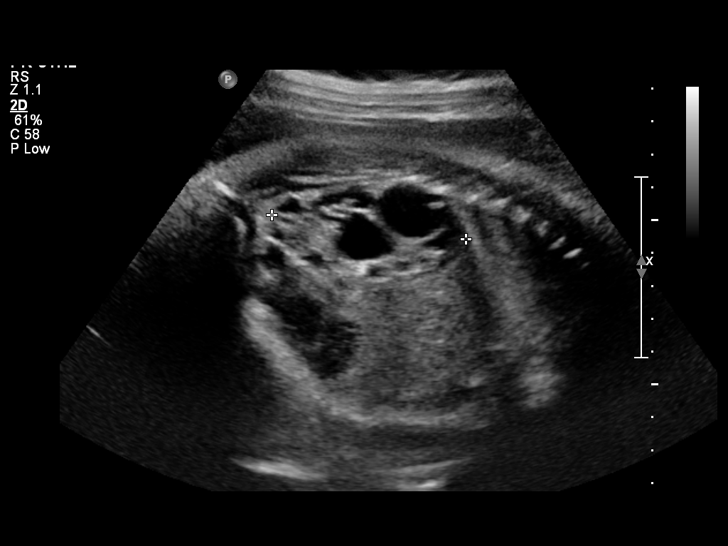
[im 12/13]
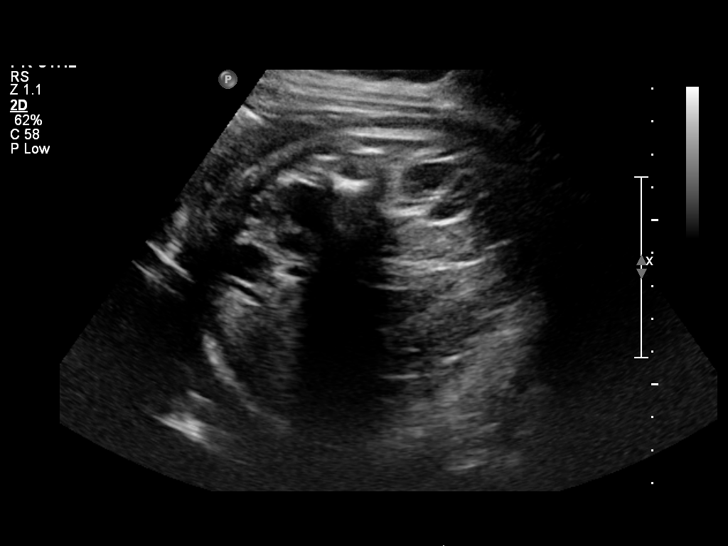
[im 13/13]
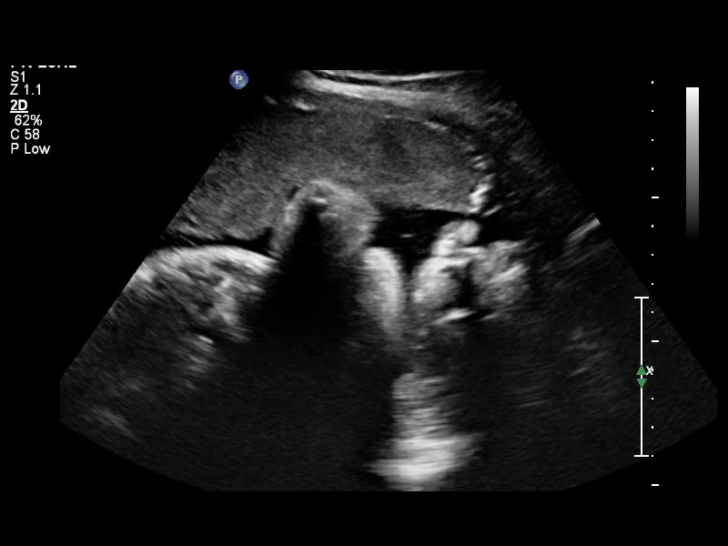

[13 of 13 positions shown; findings below may reference images not displayed]

OBSTETRICS REPORT
                      (Signed Final 12/01/2014 [DATE])

Service(s) Provided

Indications

 Decreased fetal movement
 38 weeks gestation of pregnancy

 Maternal care for other specified fetal problems,
 third trimester, multicystic dysplastic kidney
Fetal Evaluation

 Num Of Fetuses:    1
 Fetal Heart Rate:  152                          bpm
 Cardiac Activity:  Observed
 Presentation:      Cephalic

 Amniotic Fluid
 AFI FV:      Subjectively within normal limits
                                             Larg Pckt:     4.2  cm
Biophysical Evaluation

 Amniotic F.V:   Pocket => 2 cm two         F. Tone:        Observed
                 planes
 F. Movement:    Not Observed               Score:          [DATE]
 F. Breathing:   Observed
Gestational Age

 LMP:           22w 2d        Date:  06/27/14                 EDD:   04/03/15
 Best:          38w 0d     Det. By:  Early Ultrasound         EDD:   12/14/14
                                     (04/21/14)
Impression

 SIUP at 38+0 weeks
 Right multicystic dysplastic kidney
 Normal amniotic fluid volume
 BPP [DATE] (-2 for < 3 gross body movements)

Recommendations

 DEW for NST per Dr. Qu

## 2015-09-03 ENCOUNTER — Encounter (HOSPITAL_COMMUNITY): Payer: Self-pay | Admitting: *Deleted

## 2015-09-03 ENCOUNTER — Emergency Department (HOSPITAL_COMMUNITY)
Admission: EM | Admit: 2015-09-03 | Discharge: 2015-09-03 | Disposition: A | Discharge status: Home / Self Care | Payer: Medicaid Other | Attending: Emergency Medicine | Admitting: Emergency Medicine

## 2015-09-03 DIAGNOSIS — J45909 Unspecified asthma, uncomplicated: Secondary | ICD-10-CM | POA: Insufficient documentation

## 2015-09-03 DIAGNOSIS — Z79899 Other long term (current) drug therapy: Secondary | ICD-10-CM | POA: Insufficient documentation

## 2015-09-03 DIAGNOSIS — Z3202 Encounter for pregnancy test, result negative: Secondary | ICD-10-CM | POA: Insufficient documentation

## 2015-09-03 DIAGNOSIS — Z87891 Personal history of nicotine dependence: Secondary | ICD-10-CM | POA: Insufficient documentation

## 2015-09-03 DIAGNOSIS — R197 Diarrhea, unspecified: Secondary | ICD-10-CM

## 2015-09-03 DIAGNOSIS — R1010 Upper abdominal pain, unspecified: Secondary | ICD-10-CM

## 2015-09-03 LAB — URINALYSIS, ROUTINE W REFLEX MICROSCOPIC
BILIRUBIN URINE: NEGATIVE
GLUCOSE, UA: NEGATIVE mg/dL
Hgb urine dipstick: NEGATIVE
KETONES UR: NEGATIVE mg/dL
Leukocytes, UA: NEGATIVE
Nitrite: NEGATIVE
PH: 7.5 (ref 5.0–8.0)
Protein, ur: NEGATIVE mg/dL
Specific Gravity, Urine: 1.028 (ref 1.005–1.030)
Urobilinogen, UA: 0.2 mg/dL (ref 0.0–1.0)

## 2015-09-03 LAB — COMPREHENSIVE METABOLIC PANEL
ALT: 35 U/L (ref 14–54)
AST: 25 U/L (ref 15–41)
Albumin: 3.6 g/dL (ref 3.5–5.0)
Alkaline Phosphatase: 111 U/L (ref 38–126)
Anion gap: 7 (ref 5–15)
BUN: 10 mg/dL (ref 6–20)
CHLORIDE: 107 mmol/L (ref 101–111)
CO2: 24 mmol/L (ref 22–32)
CREATININE: 0.56 mg/dL (ref 0.44–1.00)
Calcium: 9.3 mg/dL (ref 8.9–10.3)
GFR calc Af Amer: >60 mL/min (ref >60)
Glucose, Bld: 110 mg/dL — ABNORMAL HIGH (ref 65–99)
Potassium: 3.8 mmol/L (ref 3.5–5.1)
SODIUM: 138 mmol/L (ref 135–145)
Total Bilirubin: 0.2 mg/dL — ABNORMAL LOW (ref 0.3–1.2)
Total Protein: 7.4 g/dL (ref 6.5–8.1)

## 2015-09-03 LAB — CBC
HCT: 41 % (ref 36.0–46.0)
Hemoglobin: 13.4 g/dL (ref 12.0–15.0)
MCH: 28 pg (ref 26.0–34.0)
MCHC: 32.7 g/dL (ref 30.0–36.0)
MCV: 85.8 fL (ref 78.0–100.0)
PLATELETS: 389 10*3/uL (ref 150–400)
RBC: 4.78 MIL/uL (ref 3.87–5.11)
RDW: 13.1 % (ref 11.5–15.5)
WBC: 12.9 10*3/uL — ABNORMAL HIGH (ref 4.0–10.5)

## 2015-09-03 LAB — HCG, QUANTITATIVE, PREGNANCY: hCG, Beta Chain, Quant, S: 1 m[IU]/mL (ref <5)

## 2015-09-03 LAB — LIPASE, BLOOD: LIPASE: 20 U/L — AB (ref 22–51)

## 2015-09-03 MED ORDER — DICYCLOMINE HCL 20 MG PO TABS: 20.0000 mg | ORAL_TABLET | Freq: Two times a day (BID) | ORAL | Status: DC

## 2015-09-03 MED ORDER — DICYCLOMINE HCL 10 MG PO CAPS
10.0000 mg | ORAL_CAPSULE | Freq: Once | ORAL | Status: AC
  Administered 2015-09-03: 10 mg via ORAL
  Filled 2015-09-03: qty 1

## 2015-09-03 MED ORDER — OMEPRAZOLE 20 MG PO CPDR: 20.0000 mg | DELAYED_RELEASE_CAPSULE | Freq: Every day | ORAL | Status: DC

## 2015-09-03 MED ORDER — GI COCKTAIL ~~LOC~~
30.0000 mL | Freq: Once | ORAL | Status: AC
  Administered 2015-09-03: 30 mL via ORAL
  Filled 2015-09-03: qty 30

## 2015-09-03 NOTE — ED Provider Notes (Signed)
CSN: [id number]     Arrival date & time 09/03/15  1133 History   First MD Initiated Contact with Patient 09/03/15 1254     Chief Complaint  Patient presents with  . Abdominal Pain     (Consider location/radiation/quality/duration/timing/severity/associated sxs/prior Treatment) HPI Comments: Patient presents today with upper abdominal pain.  She reports that she is having pain across her upper abdomen bilaterally intermittently for the past 3 days.  She states that when the pain comes it feels "dull"  Patient states that at times the pain only lasts a few minutes, but can last up to an hour.  She reports that the pain is brought on by eating.  She has taken Tylenol and Advil for the pain without relief.  She states that she has never had pain like this previously.  She reports associated nausea, but denies vomiting.  She also reports associated diarrhea.  She states that she had a one episode of loose stool this morning and has been having 1-2 episodes of loose stool daily for one week.   Denies fever, chills, cough, SOB, chest pain, urinary symptoms.  No known history of GERD, PUD, or Gallstones.     Patient is a 24 y.o. female presenting with abdominal pain. The history is provided by the patient.  Abdominal Pain   Past Medical History  Diagnosis Date  . Asthma   . Pyloric stenosis    Past Surgical History  Procedure Laterality Date  . Pyloromyotomy     Family History  Problem Relation Age of Onset  . Arthritis Mother   . Cancer Mother     thyroid  . Depression Mother   . Hyperlipidemia Father   . Asthma Father   . Hypertension Father   . COPD Maternal Grandfather   . Diabetes Paternal Grandmother   . Arthritis Paternal Grandmother   . Asthma Paternal Grandmother   . Heart disease Paternal Grandmother   . Depression Paternal Grandmother   . Hypertension Paternal Grandmother   . Other Neg Hx    Social History  Substance Use Topics  . Smoking status: Former Smoker -- 0.25  packs/day    Types: Cigarettes    Quit date: 05/08/2012  . Smokeless tobacco: Never Used  . Alcohol Use: No   OB History    Gravida Para Term Preterm AB TAB SAB Ectopic Multiple Living   2 2 2  0 0 0 0 0 0 2     Review of Systems  Gastrointestinal: Positive for abdominal pain.  All other systems reviewed and are negative.     Allergies  Review of patient's allergies indicates no known allergies.  Home Medications   Prior to Admission medications   Medication Sig Start Date End Date Taking? Authorizing Provider  albuterol (PROVENTIL HFA;VENTOLIN HFA) 108 (90 BASE) MCG/ACT inhaler Inhale 2 puffs into the lungs every 6 (six) hours as needed for wheezing or shortness of breath.    Historical Provider, MD   BP 132/84 mmHg  Pulse 88  Temp(Src) 99.3 F (37.4 C) (Oral)  Resp 18  Ht 5' (1.524 m)  Wt 150 lb (68.04 kg)  BMI 29.30 kg/m2  SpO2 96%  LMP 07/09/2015 Physical Exam  Constitutional: She appears well-developed and well-nourished.  HENT:  Head: Normocephalic and atraumatic.  Mouth/Throat: Oropharynx is clear and moist.  Neck: Normal range of motion. Neck supple.  Cardiovascular: Normal rate, regular rhythm and normal heart sounds.   Pulmonary/Chest: Effort normal and breath sounds normal.  Abdominal: Soft. Bowel  sounds are normal. She exhibits no distension and no mass. There is no tenderness. There is no rebound, no guarding and negative Murphy's sign.  Musculoskeletal: Normal range of motion.  Skin: Skin is warm and dry.  Psychiatric: She has a normal mood and affect.  Nursing note and vitals reviewed.   ED Course  Procedures (including critical care time) Labs Review Labs Reviewed  CBC - Abnormal; Notable for the following:    WBC 12.9 (*)    All other components within normal limits  LIPASE, BLOOD  COMPREHENSIVE METABOLIC PANEL  URINALYSIS, ROUTINE W REFLEX MICROSCOPIC (NOT AT [facility])  HCG, QUANTITATIVE, PREGNANCY    Imaging Review No results found. I  have personally reviewed and evaluated these images and lab results as part of my medical decision-making.   EKG Interpretation None     2:28 PM Reassessed patient.  She reports some improvement in pain after taking GI cocktail. Abdomen soft with mild periumbilical tenderness to palpation bilaterally.  No rebound or guarding.  No tenderness to palpation over McBurney's point. 3:06 PM Reassessed patient.  She reports that pain has improved at this time. MDM   Final diagnoses:  None   Patient presents today with a chief complaint of intermittent upper abdominal pain that is worse after eating and also loose stool.  Labs today unremarkable. Urine pregnancy negative. Abdomen is soft and non tender on exam..  No rebound or guarding.   Negative Murphy's sign.  Pain improved after GI cocktail.  Patient tolerating PO liquids.  Feel that the patient is stable for discharge.  Return precautions given.    Hyman Bible, PA-C 09/03/15 1615  Orlie Dakin, MD 09/03/15 1725

## 2015-09-03 NOTE — ED Notes (Signed)
Pt c/o generalized abdominal pain that radiates to back every time she eats since Tuesday.

## 2015-09-03 NOTE — Discharge Instructions (Signed)
Take medications as prescribed.  Abdominal Pain  Your exam might not show the exact reason you have abdominal pain. Since there are many different causes of abdominal pain, another checkup and more tests may be needed. It is very important to follow up for lasting (persistent) or worsening symptoms. A possible cause of abdominal pain in any person who still has his or her appendix is acute appendicitis. Appendicitis is often hard to diagnose. Normal blood tests, urine tests, ultrasound, and CT scans do not completely rule out early appendicitis or other causes of abdominal pain. Sometimes, only the changes that happen over time will allow appendicitis and other causes of abdominal pain to be determined. Other potential problems that may require surgery may also take time to become more apparent. Because of this, it is important that you follow all of the instructions below.   HOME CARE INSTRUCTIONS  Do not take laxatives unless directed by your caregiver. Rest as much as possible.  Do not eat solid food until your pain is gone: A diet of water, weak decaffeinated tea, broth or bouillon, gelatin, oral rehydration solutions (ORS), frozen ice pops, or ice chips may be helpful.  When pain is gone: Start a light diet (dry toast, crackers, applesauce, or white rice). Increase the diet slowly as long as it does not bother you. Eat no dairy products (including cheese and eggs) and no spicy, fatty, fried, or high-fiber foods.  Use no alcohol, caffeine, or cigarettes.  Take your regular medicines unless your caregiver told you not to.  Take any prescribed medicine as directed.   SEEK IMMEDIATE MEDICAL CARE IF:  The pain does not go away.  You have a fever >101 that persists You keep throwing up (vomiting) or cannot drink liquids.  The pain becomes localized (Pain in the right side could possibly be appendicitis. In an adult, pain in the left lower portion of the abdomen could be colitis or  diverticulitis). You pass bloody or black tarry stools.  You have shaking chills.  There is blood in your vomit or you see blood in your bowel movements.  Your bowel movements stop (become blocked) or you cannot pass gas.  You have bloody, frequent, or painful urination.  You have yellow discoloration in the skin or whites of the eyes.  Your stomach becomes bloated or bigger.  You have dizziness or fainting.  You have chest or back pain.

## 2015-09-05 ENCOUNTER — Inpatient Hospital Stay (HOSPITAL_COMMUNITY)
Admission: AD | Admit: 2015-09-05 | Discharge: 2015-09-05 | Discharge status: Against Medical Advice | Payer: Medicaid Other | Source: Ambulatory Visit | Attending: Obstetrics & Gynecology | Admitting: Obstetrics & Gynecology

## 2015-09-05 DIAGNOSIS — Z5320 Procedure and treatment not carried out because of patient's decision for unspecified reasons: Secondary | ICD-10-CM | POA: Insufficient documentation

## 2015-09-05 LAB — POCT PREGNANCY, URINE: PREG TEST UR: NEGATIVE

## 2015-09-05 NOTE — MAU Note (Signed)
C/o of abd pain x 5 days, was seen at [facility] for same complaint on Sunday, states was told it was likely acid reflus.  States increased pain in mid. abd radiating to mid back

## 2015-09-05 NOTE — MAU Note (Signed)
Pt called, not in lobby 

## 2015-09-05 NOTE — MAU Note (Signed)
Also states diarhea x 7 days

## 2015-09-05 NOTE — MAU Note (Signed)
Pt not in lobby, did not inform staff she was leaving. 

## 2015-10-24 ENCOUNTER — Encounter (HOSPITAL_COMMUNITY): Payer: Self-pay | Admitting: Emergency Medicine

## 2015-10-24 ENCOUNTER — Emergency Department (HOSPITAL_COMMUNITY)
Admission: EM | Admit: 2015-10-24 | Discharge: 2015-10-24 | Disposition: A | Discharge status: Home / Self Care | Payer: Medicaid Other | Attending: Emergency Medicine | Admitting: Emergency Medicine

## 2015-10-24 ENCOUNTER — Emergency Department (HOSPITAL_COMMUNITY): Payer: Medicaid Other

## 2015-10-24 DIAGNOSIS — Z3202 Encounter for pregnancy test, result negative: Secondary | ICD-10-CM | POA: Insufficient documentation

## 2015-10-24 DIAGNOSIS — R1010 Upper abdominal pain, unspecified: Secondary | ICD-10-CM

## 2015-10-24 DIAGNOSIS — R52 Pain, unspecified: Secondary | ICD-10-CM

## 2015-10-24 DIAGNOSIS — N939 Abnormal uterine and vaginal bleeding, unspecified: Secondary | ICD-10-CM | POA: Insufficient documentation

## 2015-10-24 DIAGNOSIS — J45909 Unspecified asthma, uncomplicated: Secondary | ICD-10-CM | POA: Insufficient documentation

## 2015-10-24 DIAGNOSIS — Z79899 Other long term (current) drug therapy: Secondary | ICD-10-CM | POA: Insufficient documentation

## 2015-10-24 DIAGNOSIS — Z8719 Personal history of other diseases of the digestive system: Secondary | ICD-10-CM | POA: Insufficient documentation

## 2015-10-24 LAB — URINALYSIS, ROUTINE W REFLEX MICROSCOPIC
Bilirubin Urine: NEGATIVE
Glucose, UA: NEGATIVE mg/dL
Hgb urine dipstick: NEGATIVE
Ketones, ur: NEGATIVE mg/dL
Leukocytes, UA: NEGATIVE
Nitrite: NEGATIVE
Protein, ur: NEGATIVE mg/dL
Specific Gravity, Urine: 1.009 (ref 1.005–1.030)
Urobilinogen, UA: 0.2 mg/dL (ref 0.0–1.0)
pH: 7 (ref 5.0–8.0)

## 2015-10-24 LAB — CBC
HEMATOCRIT: 42.8 % (ref 36.0–46.0)
HEMOGLOBIN: 14.2 g/dL (ref 12.0–15.0)
MCH: 28.5 pg (ref 26.0–34.0)
MCHC: 33.2 g/dL (ref 30.0–36.0)
MCV: 85.9 fL (ref 78.0–100.0)
Platelets: 397 10*3/uL (ref 150–400)
RBC: 4.98 MIL/uL (ref 3.87–5.11)
RDW: 13.8 % (ref 11.5–15.5)
WBC: 11.3 10*3/uL — AB (ref 4.0–10.5)

## 2015-10-24 LAB — COMPREHENSIVE METABOLIC PANEL
ALK PHOS: 93 U/L (ref 38–126)
ALT: 29 U/L (ref 14–54)
ANION GAP: 9 (ref 5–15)
AST: 25 U/L (ref 15–41)
Albumin: 4.6 g/dL (ref 3.5–5.0)
BILIRUBIN TOTAL: 0.4 mg/dL (ref 0.3–1.2)
BUN: 10 mg/dL (ref 6–20)
CALCIUM: 9.4 mg/dL (ref 8.9–10.3)
CO2: 23 mmol/L (ref 22–32)
Chloride: 108 mmol/L (ref 101–111)
Creatinine, Ser: 0.62 mg/dL (ref 0.44–1.00)
GFR calc Af Amer: >60 mL/min (ref >60)
Glucose, Bld: 101 mg/dL — ABNORMAL HIGH (ref 65–99)
POTASSIUM: 3.7 mmol/L (ref 3.5–5.1)
Sodium: 140 mmol/L (ref 135–145)
TOTAL PROTEIN: 8.5 g/dL — AB (ref 6.5–8.1)

## 2015-10-24 LAB — I-STAT BETA HCG BLOOD, ED (MC, WL, AP ONLY)

## 2015-10-24 LAB — WET PREP, GENITAL
CLUE CELLS WET PREP: NONE SEEN
Trich, Wet Prep: NONE SEEN
Yeast Wet Prep HPF POC: NONE SEEN

## 2015-10-24 LAB — LIPASE, BLOOD: Lipase: 26 U/L (ref 11–51)

## 2015-10-24 MED ORDER — FAMOTIDINE 20 MG PO TABS: 20.0000 mg | ORAL_TABLET | Freq: Two times a day (BID) | ORAL | Status: DC

## 2015-10-24 MED ORDER — GI COCKTAIL ~~LOC~~
30.0000 mL | Freq: Once | ORAL | Status: AC
  Administered 2015-10-24: 30 mL via ORAL
  Filled 2015-10-24: qty 30

## 2015-10-24 MED ORDER — FAMOTIDINE 20 MG PO TABS
20.0000 mg | ORAL_TABLET | Freq: Once | ORAL | Status: AC
  Administered 2015-10-24: 20 mg via ORAL
  Filled 2015-10-24: qty 1

## 2015-10-24 NOTE — Discharge Instructions (Signed)
Abdominal Pain, Adult Many things can cause abdominal pain. Usually, abdominal pain is not caused by a disease and will improve without treatment. It can often be observed and treated at home. Your health care provider will do a physical exam and possibly order blood tests and X-rays to help determine the seriousness of your pain. However, in many cases, more time must pass before a clear cause of the pain can be found. Before that point, your health care provider may not know if you need more testing or further treatment. HOME CARE INSTRUCTIONS Monitor your abdominal pain for any changes. The following actions may help to alleviate any discomfort you are experiencing:  Only take over-the-counter or prescription medicines as directed by your health care provider.  Do not take laxatives unless directed to do so by your health care provider.  Try a clear liquid diet (broth, tea, or water) as directed by your health care provider. Slowly move to a bland diet as tolerated. SEEK MEDICAL CARE IF:  You have unexplained abdominal pain.  You have abdominal pain associated with nausea or diarrhea.  You have pain when you urinate or have a bowel movement.  You experience abdominal pain that wakes you in the night.  You have abdominal pain that is worsened or improved by eating food.  You have abdominal pain that is worsened with eating fatty foods.  You have a fever. SEEK IMMEDIATE MEDICAL CARE IF:  Your pain does not go away within 2 hours.  You keep throwing up (vomiting).  Your pain is felt only in portions of the abdomen, such as the right side or the left lower portion of the abdomen.  You pass bloody or black tarry stools. MAKE SURE YOU:  Understand these instructions.  Will watch your condition.  Will get help right away if you are not doing well or get worse.   This information is not intended to replace advice given to you by your health care provider. Make sure you discuss  any questions you have with your health care provider.   Document Released: 09/25/2005 Document Revised: 09/06/2015 Document Reviewed: 08/25/2013 Elsevier Interactive Patient Education 2016 Elsevier Inc. Gastritis, Adult Gastritis is soreness and swelling (inflammation) of the lining of the stomach. Gastritis can develop as a sudden onset (acute) or long-term (chronic) condition. If gastritis is not treated, it can lead to stomach bleeding and ulcers. CAUSES  Gastritis occurs when the stomach lining is weak or damaged. Digestive juices from the stomach then inflame the weakened stomach lining. The stomach lining may be weak or damaged due to viral or bacterial infections. One common bacterial infection is the Helicobacter pylori infection. Gastritis can also result from excessive alcohol consumption, taking certain medicines, or having too much acid in the stomach.  SYMPTOMS  In some cases, there are no symptoms. When symptoms are present, they may include:  Pain or a burning sensation in the upper abdomen.  Nausea.  Vomiting.  An uncomfortable feeling of fullness after eating. DIAGNOSIS  Your caregiver may suspect you have gastritis based on your symptoms and a physical exam. To determine the cause of your gastritis, your caregiver may perform the following:  Blood or stool tests to check for the H pylori bacterium.  Gastroscopy. A thin, flexible tube (endoscope) is passed down the esophagus and into the stomach. The endoscope has a light and camera on the end. Your caregiver uses the endoscope to view the inside of the stomach.  Taking a tissue sample (biopsy)  from the stomach to examine under a microscope. TREATMENT  Depending on the cause of your gastritis, medicines may be prescribed. If you have a bacterial infection, such as an H pylori infection, antibiotics may be given. If your gastritis is caused by too much acid in the stomach, H2 blockers or antacids may be given. Your  caregiver may recommend that you stop taking aspirin, ibuprofen, or other nonsteroidal anti-inflammatory drugs (NSAIDs). HOME CARE INSTRUCTIONS  Only take over-the-counter or prescription medicines as directed by your caregiver.  If you were given antibiotic medicines, take them as directed. Finish them even if you start to feel better.  Drink enough fluids to keep your urine clear or pale yellow.  Avoid foods and drinks that make your symptoms worse, such as:  Caffeine or alcoholic drinks.  Chocolate.  Peppermint or mint flavorings.  Garlic and onions.  Spicy foods.  Citrus fruits, such as oranges, lemons, or limes.  Tomato-based foods such as sauce, chili, salsa, and pizza.  Fried and fatty foods.  Eat small, frequent meals instead of large meals. SEEK IMMEDIATE MEDICAL CARE IF:   You have black or dark red stools.  You vomit blood or material that looks like coffee grounds.  You are unable to keep fluids down.  Your abdominal pain gets worse.  You have a fever.  You do not feel better after 1 week.  You have any other questions or concerns. MAKE SURE YOU:  Understand these instructions.  Will watch your condition.  Will get help right away if you are not doing well or get worse.   This information is not intended to replace advice given to you by your health care provider. Make sure you discuss any questions you have with your health care provider.   Document Released: 12/10/2001 Document Revised: 06/16/2012 Document Reviewed: 01/29/2012 Elsevier Interactive Patient Education 2016 [employer].  Emergency Department Resource Guide 1) Find a Doctor and Pay Out of Pocket Although you won't have to find out who is covered by your insurance plan, it is a good idea to ask around and get recommendations. You will then need to call the office and see if the doctor you have chosen will accept you as a new patient and what types of options they offer for patients  who are self-pay. Some doctors offer discounts or will set up payment plans for their patients who do not have insurance, but you will need to ask so you aren't surprised when you get to your appointment.  2) Contact Your Local Health Department Not all health departments have doctors that can see patients for sick visits, but many do, so it is worth a call to see if yours does. If you don't know where your local health department is, you can check in your phone book. The CDC also has a tool to help you locate your state's health department, and many state websites also have listings of all of their local health departments.  3) Find a [facility] If your illness is not likely to be very severe or complicated, you may want to try a walk in clinic. These are popping up all over the country in pharmacies, drugstores, and shopping centers. They're usually staffed by nurse practitioners or physician assistants that have been trained to treat common illnesses and complaints. They're usually fairly quick and inexpensive. However, if you have serious medical issues or chronic medical problems, these are probably not your best option.  No Primary Care Doctor: - Call  Health Connect at  [phone] - they can help you locate a primary care doctor that  accepts your insurance, provides certain services, etc. - Physician Referral Service- [phone]  Chronic Pain Problems: Organization         Address  Phone   Notes  [facility]  [phone] Patients need to be referred by their primary care doctor.   Medication Assistance: Organization         Address  Phone   Notes  [facility] Medication [facility] [address] [place], [place] [address] [phone] --Must be a resident of [facility] -- Must have NO insurance coverage whatsoever (no Medicaid/ Medicare, etc.) -- The pt. MUST have a primary care doctor that directs their care regularly and follows  them in the community   MedAssist  [phone]   [employer]  [phone]    Agencies that provide inexpensive medical care: Organization         Address  Phone   Notes  Tees Toh  [phone]   [facility]    [phone]   [facility] [place], [place] [address] [phone]   [place] [address] [place] [phone]   Planned Parenthood    [phone]   [facility]    [phone]   [place] and [place] [address], [place] Phone:  [phone], Fax:  [phone] Hours of Operation:  9 am - 6 pm, M-F.  Also accepts Medicaid/Medicare and self-pay.  [facility] for [place] [address], [address], [place] Phone: [phone], Fax: [phone]. Hours of Operation:  8:30 am - 5:30 pm, M-F.  Also accepts Medicaid and self-pay.  [facility] [place] [address], [place] Phone: [phone]   [place], [place], [place] [phone], Ext. [phone] Mondays & Thursdays: 7-9 AM.  First 15 patients are seen on a first come, first serve basis.    [place] Providers:  Organization         Address  Phone   Notes  [facility] [address], [place] [phone] Also accepts self-pay patients.  [facility] [address], [place]  [phone]   [place], [address], [place] [phone]   [facility] [address] [place] [phone]   Lucianne Lei [address], [place]   [phone] Only accepts [place] Access [place] patients after they have their name applied to their card.   Self-Pay (no insurance) in [facility]:  Organization         Address  Phone   Notes  Sickle Cell  Patients, [facility] [place] [phone]   [facility] [place] [phone]   [facility] [place]  [place], [place], [place] [phone]   [facility]/Dr. Osei-Bonsu  [address], [place] or [address], [place] [phone] Phone number for both [place] and [place] locations is the same.  Urgent Medical and [facility] [address] [place] [phone]   [place]  [address], [place] or [address] [phone]   [facility] [place] [phone], phone; [phone], fax Sees patients 1st and 3rd Saturday of every month.  Must not qualify for public or private insurance (i.e. Medicaid, Medicare, [place] Health Choice, Veterans' Benefits)  Household income should be no more than 200% of the poverty level The clinic cannot treat you if you are pregnant or think you are pregnant  Sexually transmitted diseases are not treated at the clinic.    Dental Care: Organization         Address  Phone  Notes  [facility] [place] [phone] Accepts children up to age 28 who are enrolled in [place] or [place]; pregnant women with a Medicaid card; and children who have applied for Medicaid or [place] Health Choice, but were declined, whose parents can pay a reduced fee at time of service.  [facility] Department of [name]  [address], [place] [phone] Accepts children up to age 81 who are enrolled in [place] or [place]; pregnant women with a Medicaid card; and children who have applied for Medicaid or [place] Health Choice, but were declined, whose parents can pay a reduced fee at time of service.  [facility] Adult Dental Access  PROGRAM  [place] [phone] Patients are seen by appointment only. Walk-ins are not accepted. [place] will see patients 98 years of age and older. Monday - Tuesday (8am-5pm) Most Wednesdays (8:30-5pm) $30 per visit, cash only  [facility] Adult Dental Access PROGRAM  [address], [facility] [phone] Patients are seen by appointment only. Walk-ins are not accepted. [place] will see patients 16 years of age and older. One Wednesday Evening (Monthly: Volunteer Based).  $30 per visit, cash only  [place]  [phone] for adults; Children under age 36, call Graduate Pediatric Dentistry at [phone]. Children aged 20-14, please call [phone] to request a pediatric application.  Dental services are provided in all areas of dental care including fillings, crowns and bridges, complete and partial dentures, implants, gum treatment, root canals, and extractions. Preventive care is also provided. Treatment is provided to both adults and children. Patients are selected via a lottery and there is often a waiting list.   [facility] [address] [place]  [phone] [email or web address]   [facility] [address] [place], [place] [phone], Ext. [phone] Second and Fourth Thursday of each month, opens at 6:30 AM; Clinic ends at 9 AM.  Patients are seen on a first-come first-served basis, and a limited number are seen during each clinic.   [facility]  [address], [place] [phone]   Eligibility Requirements You must have lived in [place], [place], or [place] counties for at least the last three months.   You cannot be eligible for state or federal sponsored [employer], including [employer], [place], or [employer].   You generally cannot be eligible for healthcare insurance through your employer.    How to apply: Eligibility  screenings are held every Tuesday and Wednesday afternoon from 1:00 pm until 4:00 pm. You do not need an appointment for the interview!  [facility] [address], [place], [place]   [place]  [place]  [place]  [phone]    Behavioral Health Resources in the Community: Intensive Outpatient Programs Organization         Address  Phone  Notes  [place], [place], [place] [phone]   [facility] [address] [place], [place]   ADS: Alcohol & Drug Svcs [address], [place], [place]   [place] [address]  [place], [place] or [phone]   Substance Abuse Resources Organization         Address  Phone  Notes  Alcohol and Drug Services  [phone]   [place]  [phone]   [place]   Chinita Pester  [phone]   Residential & Outpatient Substance Abuse Program  [phone]   Psychological Services Organization         Address  Phone  Notes  [facility] [place]  [place]  [phone]   [place] [address] [place] or [phone]    Mobile Crisis Teams Organization         Address  Phone  Notes  [facility], Mobile Crisis Care Unit  [phone]   [facility]  [address] [place], [place]   [place] [address], [place] [phone]    Self-Help/Support Groups Organization         Address  Phone             Notes  [place] of [place] - variety of support groups  [place] Call for more information  Narcotics Anonymous (NA), [facility]aring [facility] [address], [employer] [place]  2 meetings at  this location   Special educational needs teacher         Address  Phone  Notes  ASAP Residential Treatment [place],    [place]  [phone]   [facility]  [address] [place] [address], [place], [place]   [place], [place] [phone] Admissions: 8am-3pm M-F  Incentives Substance [place] [address]    [place], [place] [phone]   [facility] [address] [place], [place], [place]   [facility] [address]  [place], [place]   [facility] Programs - Intensive Outpatient [address], [place], [place]   [facility] ([place] [address].,  [place], [place] [phone] or [phone]   [facility]idential Treatment Services (RTS) [address], [place], [place] Accepts Medicaid  [facility] [place] [address],  [place] [phone] Substance Abuse/Addiction Treatment   [facility] Organization         Address  Phone  Notes  [facility]  [phone]   Domenic Schwab, PhD [address] Arlis [place], [place]   [phone] or [phone]   [place], [place] [phone]   [place] [address], [place], [place] [phone] Insurance/Medicaid/sponsorship through [employer] and Families [address], [facility] 9[address]  [place], [place] [phone] [place], [place] [phone]    Dr. Adele Schilder  [phone]   [facility] Dept. 1) [address], [place] 2) [place] 3)  [address], [place] [phone]  [phone]   [place] [phone] or [phone] (After Hours)

## 2015-10-24 NOTE — ED Notes (Signed)
Patient transported to Ultrasound 

## 2015-10-24 NOTE — ED Notes (Signed)
Bed: WHALB Expected date:  Expected time:  Means of arrival:  Comments: 

## 2015-10-24 NOTE — ED Notes (Signed)
Pt states she got off her period x2 weeks ago, began light spotting (very unusual for her) this Sunday, yesterday began having constant dull/aching back/stomach pains 6/10. States she takes birth control, but it's possible she could be pregnant. Denies CP, SOB, vomiting/diarrhea. States she's mildly nauseous, but she feels like it's from the pain.

## 2015-10-24 NOTE — ED Provider Notes (Signed)
CSN: [id number]     Arrival date & time 10/24/15  1441 History   First MD Initiated Contact with Patient 10/24/15 1513     Chief Complaint  Patient presents with  . Abdominal Pain  . Vaginal Bleeding     (Consider location/radiation/quality/duration/timing/severity/associated sxs/prior Treatment) HPI Patient poor she's been having upper abdominal pain for about 3 days. She reports as been constant and aching. She denies it is better or worse with eating. She's had some nausea but there has been no vomiting. She denies diarrhea or irregular bowel movement. She reports she does have pain into her back as well but it is diffuse. She reports she has Implanon but she has been having some vaginal spotting off and on. She denies lower abdominal pain. She denies pain burning or urgency with urination. Past Medical History  Diagnosis Date  . Asthma   . Pyloric stenosis    Past Surgical History  Procedure Laterality Date  . Pyloromyotomy     Family History  Problem Relation Age of Onset  . Arthritis Mother   . Cancer Mother     thyroid  . Depression Mother   . Hyperlipidemia Father   . Asthma Father   . Hypertension Father   . COPD Maternal Grandfather   . Diabetes Paternal Grandmother   . Arthritis Paternal Grandmother   . Asthma Paternal Grandmother   . Heart disease Paternal Grandmother   . Depression Paternal Grandmother   . Hypertension Paternal Grandmother   . Other Neg Hx    Social History  Substance Use Topics  . Smoking status: Former Smoker -- 0.25 packs/day    Types: Cigarettes    Quit date: 05/08/2012  . Smokeless tobacco: Never Used  . Alcohol Use: No   OB History    Gravida Para Term Preterm AB TAB SAB Ectopic Multiple Living   2 2 2  0 0 0 0 0 0 2     Review of Systems 10 Systems reviewed and are negative for acute change except as noted in the HPI.   Allergies  Review of patient's allergies indicates no known allergies.  Home Medications   Prior to  Admission medications   Medication Sig Start Date End Date Taking? Authorizing Provider  albuterol (PROVENTIL HFA;VENTOLIN HFA) 108 (90 BASE) MCG/ACT inhaler Inhale 2 puffs into the lungs every 6 (six) hours as needed for wheezing or shortness of breath.   Yes Historical Provider, MD  dicyclomine (BENTYL) 20 MG tablet Take 1 tablet (20 mg total) by mouth 2 (two) times daily. Patient not taking: Reported on 10/24/2015 09/03/15   Hyman Bible, PA-C  famotidine (PEPCID) 20 MG tablet Take 1 tablet (20 mg total) by mouth 2 (two) times daily. 10/24/15   Charlesetta Shanks, MD  omeprazole (PRILOSEC) 20 MG capsule Take 1 capsule (20 mg total) by mouth daily. Patient not taking: Reported on 10/24/2015 09/03/15   Heather Laisure, PA-C   BP 138/73 mmHg  Pulse 84  Temp(Src) 98.1 F (36.7 C) (Oral)  Resp 16  SpO2 99%  LMP 10/10/2015 Physical Exam  Constitutional: She is oriented to person, place, and time. She appears well-developed and well-nourished.  HENT:  Head: Normocephalic and atraumatic.  Eyes: EOM are normal. Pupils are equal, round, and reactive to light.  Neck: Neck supple.  Cardiovascular: Normal rate, regular rhythm, normal heart sounds and intact distal pulses.   Pulmonary/Chest: Effort normal and breath sounds normal.  Abdominal: Soft. Bowel sounds are normal. She exhibits no distension. There is  tenderness.  Moderate diffuse upper abdominal pain without guarding or localizing right to left. Lower abdomen nontender.  Genitourinary:  Normal extraocular genitalia. Speculum examination no cervical friability or erythema. No significant vaginal discharge. Bimanual examination is nontender. No appreciable mass or fullness.  Musculoskeletal: Normal range of motion. She exhibits no edema.  Neurological: She is alert and oriented to person, place, and time. She has normal strength. Coordination normal. GCS eye subscore is 4. GCS verbal subscore is 5. GCS motor subscore is 6.  Skin: Skin is warm,  dry and intact.  Psychiatric: She has a normal mood and affect.    ED Course  Procedures (including critical care time) Labs Review Labs Reviewed  WET PREP, GENITAL - Abnormal; Notable for the following:    WBC, Wet Prep HPF POC FEW (*)    All other components within normal limits  COMPREHENSIVE METABOLIC PANEL - Abnormal; Notable for the following:    Glucose, Bld 101 (*)    Total Protein 8.5 (*)    All other components within normal limits  CBC - Abnormal; Notable for the following:    WBC 11.3 (*)    All other components within normal limits  LIPASE, BLOOD  URINALYSIS, ROUTINE W REFLEX MICROSCOPIC (NOT AT [facility])  I-STAT BETA HCG BLOOD, ED (MC, [facility], AP ONLY)  GC/CHLAMYDIA PROBE AMP ([place]) NOT AT [facility]    Imaging Review US Abdomen Limited Ruq  10/24/2015  CLINICAL DATA:  Right upper quadrant pain since yesterday. EXAM: US ABDOMEN LIMITED - RIGHT UPPER QUADRANT COMPARISON:  08/10/2013 FINDINGS: Gallbladder: No gallstones or wall thickening visualized. No sonographic Murphy sign noted. Common bile duct: Diameter: 4 mm. Liver: No focal lesion identified. Within normal limits in parenchymal echogenicity. Main portal vein is patent. IMPRESSION: Negative examination. Electronically Signed   By: Markus Daft M.D.   On: 10/24/2015 17:58   I have personally reviewed and evaluated these images and lab results as part of my medical decision-making.   EKG Interpretation None      MDM   Final diagnoses:  Pain of upper abdomen   Patient has had some recurrent problems with upper abdominal discomfort. She has been prescribed medications for gastritis in the past. Today her pain was nonsurgical in nature. Ultrasound was obtained to look for biliary colic or cholecystitis. There was a normal ultrasound. Patient had reported some spotting but does not have lower abdominal pain. She does regularly use birth control. Prevacid chest is negative and pelvic examination is nontender. At this  point it does not appear to be GYN related. She is advised to resume medications for gastritis. She has been advised to follow-up with her primary provider for possible GI referral and upper endoscopy. I discussed possible diagnoses of gastritis, peptic ulcer disease, hiatal hernia in the management. At this time she is otherwise well in appearance with no significant abnormalities and diagnostic studies.    Charlesetta Shanks, MD 10/25/15 [phone]

## 2015-10-25 LAB — GC/CHLAMYDIA PROBE AMP (~~LOC~~) NOT AT ARMC
CHLAMYDIA, DNA PROBE: NEGATIVE
NEISSERIA GONORRHEA: NEGATIVE

## 2015-12-08 ENCOUNTER — Telehealth: Payer: Self-pay | Admitting: *Deleted

## 2015-12-08 NOTE — Telephone Encounter (Signed)
Patient is interested in having her Nexplanon removed. Patient has family planning medicaid which is not accepted in our office. Patient advised she may be seen at the health department or [facility]. Patient verbalized understanding.

## 2016-03-14 ENCOUNTER — Other Ambulatory Visit: Payer: Self-pay | Admitting: Obstetrics and Gynecology

## 2016-03-15 LAB — CYTOLOGY - PAP

## 2016-06-03 ENCOUNTER — Inpatient Hospital Stay (HOSPITAL_COMMUNITY)
Admission: EM | Admit: 2016-06-03 | Discharge: 2016-06-08 | DRG: 194 | Disposition: A | Discharge status: Home / Self Care | Payer: Medicaid Other | Attending: Internal Medicine | Admitting: Internal Medicine

## 2016-06-03 ENCOUNTER — Emergency Department (HOSPITAL_COMMUNITY): Payer: Medicaid Other

## 2016-06-03 ENCOUNTER — Encounter (HOSPITAL_COMMUNITY): Payer: Self-pay | Admitting: Emergency Medicine

## 2016-06-03 DIAGNOSIS — F17210 Nicotine dependence, cigarettes, uncomplicated: Secondary | ICD-10-CM | POA: Diagnosis present

## 2016-06-03 DIAGNOSIS — R05 Cough: Secondary | ICD-10-CM

## 2016-06-03 DIAGNOSIS — Z79899 Other long term (current) drug therapy: Secondary | ICD-10-CM

## 2016-06-03 DIAGNOSIS — R0600 Dyspnea, unspecified: Secondary | ICD-10-CM

## 2016-06-03 DIAGNOSIS — R059 Cough, unspecified: Secondary | ICD-10-CM

## 2016-06-03 DIAGNOSIS — J45901 Unspecified asthma with (acute) exacerbation: Secondary | ICD-10-CM | POA: Diagnosis present

## 2016-06-03 DIAGNOSIS — F17200 Nicotine dependence, unspecified, uncomplicated: Secondary | ICD-10-CM | POA: Diagnosis present

## 2016-06-03 DIAGNOSIS — D72829 Elevated white blood cell count, unspecified: Secondary | ICD-10-CM

## 2016-06-03 DIAGNOSIS — J9811 Atelectasis: Secondary | ICD-10-CM

## 2016-06-03 DIAGNOSIS — J189 Pneumonia, unspecified organism: Principal | ICD-10-CM | POA: Diagnosis present

## 2016-06-03 DIAGNOSIS — H6690 Otitis media, unspecified, unspecified ear: Secondary | ICD-10-CM | POA: Diagnosis present

## 2016-06-03 DIAGNOSIS — Z7951 Long term (current) use of inhaled steroids: Secondary | ICD-10-CM

## 2016-06-03 DIAGNOSIS — R0602 Shortness of breath: Secondary | ICD-10-CM

## 2016-06-03 LAB — CBC WITH DIFFERENTIAL/PLATELET
BASOS PCT: 0 %
Basophils Absolute: 0 10*3/uL (ref 0.0–0.1)
EOS PCT: 3 %
Eosinophils Absolute: 0.5 10*3/uL (ref 0.0–0.7)
HEMATOCRIT: 42.5 % (ref 36.0–46.0)
Hemoglobin: 14.6 g/dL (ref 12.0–15.0)
Lymphocytes Relative: 25 %
Lymphs Abs: 4.2 10*3/uL — ABNORMAL HIGH (ref 0.7–4.0)
MCH: 29 pg (ref 26.0–34.0)
MCHC: 34.4 g/dL (ref 30.0–36.0)
MCV: 84.5 fL (ref 78.0–100.0)
MONO ABS: 0.9 10*3/uL (ref 0.1–1.0)
MONOS PCT: 5 %
NEUTROS ABS: 11.6 10*3/uL — AB (ref 1.7–7.7)
Neutrophils Relative %: 67 %
PLATELETS: 363 10*3/uL (ref 150–400)
RBC: 5.03 MIL/uL (ref 3.87–5.11)
RDW: 13.8 % (ref 11.5–15.5)
WBC: 17.3 10*3/uL — ABNORMAL HIGH (ref 4.0–10.5)

## 2016-06-03 LAB — BASIC METABOLIC PANEL
ANION GAP: 6 (ref 5–15)
BUN: 8 mg/dL (ref 6–20)
CALCIUM: 8.9 mg/dL (ref 8.9–10.3)
CO2: 20 mmol/L — AB (ref 22–32)
CREATININE: 0.66 mg/dL (ref 0.44–1.00)
Chloride: 110 mmol/L (ref 101–111)
GFR calc Af Amer: >60 mL/min (ref >60)
GFR calc non Af Amer: >60 mL/min (ref >60)
GLUCOSE: 96 mg/dL (ref 65–99)
Potassium: 3.8 mmol/L (ref 3.5–5.1)
Sodium: 136 mmol/L (ref 135–145)

## 2016-06-03 LAB — I-STAT BETA HCG BLOOD, ED (MC, WL, AP ONLY)

## 2016-06-03 LAB — I-STAT CG4 LACTIC ACID, ED: Lactic Acid, Venous: 1.7 mmol/L (ref 0.5–2.0)

## 2016-06-03 MED ORDER — METHYLPREDNISOLONE SODIUM SUCC 125 MG IJ SOLR
125.0000 mg | Freq: Once | INTRAMUSCULAR | Status: AC
  Administered 2016-06-03: 125 mg via INTRAVENOUS
  Filled 2016-06-03: qty 2

## 2016-06-03 MED ORDER — MAGNESIUM SULFATE 2 GM/50ML IV SOLN
2.0000 g | Freq: Once | INTRAVENOUS | Status: AC
  Administered 2016-06-03: 2 g via INTRAVENOUS
  Filled 2016-06-03: qty 50

## 2016-06-03 MED ORDER — GUAIFENESIN ER 600 MG PO TB12
600.0000 mg | ORAL_TABLET | Freq: Two times a day (BID) | ORAL | Status: DC
  Administered 2016-06-03 – 2016-06-06 (×6): 600 mg via ORAL
  Filled 2016-06-03 (×6): qty 1

## 2016-06-03 MED ORDER — SODIUM CHLORIDE 0.9 % IV SOLN
INTRAVENOUS | Status: DC
  Administered 2016-06-03: 21:00:00 via INTRAVENOUS

## 2016-06-03 MED ORDER — AZITHROMYCIN 250 MG PO TABS
500.0000 mg | ORAL_TABLET | ORAL | Status: DC
  Administered 2016-06-04 – 2016-06-07 (×4): 500 mg via ORAL
  Filled 2016-06-03 (×4): qty 2

## 2016-06-03 MED ORDER — METHYLPREDNISOLONE SODIUM SUCC 125 MG IJ SOLR
60.0000 mg | Freq: Four times a day (QID) | INTRAMUSCULAR | Status: DC
  Administered 2016-06-04 – 2016-06-06 (×10): 60 mg via INTRAVENOUS
  Filled 2016-06-03 (×10): qty 2

## 2016-06-03 MED ORDER — DEXTROSE 5 % IV SOLN
500.0000 mg | Freq: Once | INTRAVENOUS | Status: AC
  Administered 2016-06-03: 500 mg via INTRAVENOUS
  Filled 2016-06-03: qty 500

## 2016-06-03 MED ORDER — IPRATROPIUM-ALBUTEROL 0.5-2.5 (3) MG/3ML IN SOLN
3.0000 mL | RESPIRATORY_TRACT | Status: DC
Start: 2016-06-04 — End: 2016-06-04
  Administered 2016-06-03 – 2016-06-04 (×3): 3 mL via RESPIRATORY_TRACT
  Filled 2016-06-03 (×3): qty 3

## 2016-06-03 MED ORDER — IPRATROPIUM-ALBUTEROL 0.5-2.5 (3) MG/3ML IN SOLN
3.0000 mL | RESPIRATORY_TRACT | Status: AC
  Administered 2016-06-03 (×3): 3 mL via RESPIRATORY_TRACT
  Filled 2016-06-03 (×2): qty 3

## 2016-06-03 MED ORDER — CEFTRIAXONE SODIUM 1 G IJ SOLR
1.0000 g | Freq: Once | INTRAMUSCULAR | Status: AC
  Administered 2016-06-03: 1 g via INTRAVENOUS
  Filled 2016-06-03: qty 10

## 2016-06-03 MED ORDER — SODIUM CHLORIDE 0.9 % IV SOLN
INTRAVENOUS | Status: AC
  Administered 2016-06-03: 23:00:00 via INTRAVENOUS

## 2016-06-03 MED ORDER — DEXTROSE 5 % IV SOLN
1.0000 g | INTRAVENOUS | Status: DC
  Administered 2016-06-04 – 2016-06-07 (×4): 1 g via INTRAVENOUS
  Filled 2016-06-03 (×5): qty 10

## 2016-06-03 MED ORDER — ACETAMINOPHEN 325 MG PO TABS
650.0000 mg | ORAL_TABLET | Freq: Four times a day (QID) | ORAL | Status: DC | PRN
  Administered 2016-06-03 – 2016-06-05 (×4): 650 mg via ORAL
  Filled 2016-06-03 (×4): qty 2

## 2016-06-03 NOTE — Progress Notes (Signed)
Discussed with Pt, flutter valve vs chest vest.  Pt agreed to try the chest vest at 0400.  RT will start CPT at that time.

## 2016-06-03 NOTE — ED Notes (Signed)
Pt with hx of asthma c/o unusually severe asthma exacerbation onset today accompanied by cough onset same time. Significant inspiratory and expiratory wheezing audible without use of stethoscope. Pt used 15-20 doses of albuterol inhaler today. Last dose about 1645.

## 2016-06-03 NOTE — H&P (Signed)
History and Physical    POET LINKE [id number] DOB: 06/28/1991 DOA: 06/03/2016  PCP: Pcp Not In System   Patient coming from: Home  Chief Complaint: Shortness of breath, increased wheezing  HPI: Peggy [name] is a 25 y.o. woman with a  History of asthma (typically well controlled, only uses a rescue inhaler prn, has never been intubated or even hospitalized until now) who reports URI symptoms of nasal congestion, post nasal drip, and dry cough approximately one week ago.  No fever, chills, or sweats.  She felt improved until this morning when she developed increased wheezing, refractory to increased use of her albuterol rescue inhaler ("I used it 10 or 15 times").  She attempted to use her daughter's nebulizer machine, but it was malfunctioning.  She presented to the ED for further evaluation and treatment.  ED Course: Bilateral inspiratory and expiratory wheezing improved but not resolved after duonebs x 2 and IV solumedrol.  Chest xray concerning for RUL atelectasis.  No history of aspiration.  This finding was discussed with pulmonary attending (Dr. Emmit Alexanders) per the ED attending.  Hospitalization for further evaluation and treatment recommended.  Because she also has a leukocytosis, we will go ahead and treat empirically with Rocephin and azithromycin.  Review of Systems: No nausea or vomiting.  No light-headedness or syncope.  As per HPI otherwise 10 point review of systems negative.   Past Medical History  Diagnosis Date  . Asthma   . Pyloric stenosis     Past Surgical History  Procedure Laterality Date  . Pyloromyotomy       reports that she has been smoking Cigarettes.  She has a 1.25 pack-year smoking history. She has never used smokeless tobacco. She reports that she does not drink alcohol or use illicit drugs.  She is married.  She has two daughters.  Admits to active tobacco use.  No Known Allergies  Family History  Problem Relation Age of Onset  .  Arthritis Mother   . Cancer Mother     thyroid  . Depression Mother   . Hyperlipidemia Father   . Asthma Father   . Hypertension Father   . COPD Maternal Grandfather   . Diabetes Paternal Grandmother   . Arthritis Paternal Grandmother   . Asthma Paternal Grandmother   . Heart disease Paternal Grandmother   . Depression Paternal Grandmother   . Hypertension Paternal Grandmother   . Other Neg Hx     Prior to Admission medications   Medication Sig Start Date End Date Taking? Authorizing Provider  albuterol (PROVENTIL HFA;VENTOLIN HFA) 108 (90 BASE) MCG/ACT inhaler Inhale 2 puffs into the lungs every 6 (six) hours as needed for wheezing or shortness of breath.   Yes Historical Provider, MD  dicyclomine (BENTYL) 20 MG tablet Take 1 tablet (20 mg total) by mouth 2 (two) times daily. Patient not taking: Reported on 10/24/2015 09/03/15   Hyman Bible, PA-C  famotidine (PEPCID) 20 MG tablet Take 1 tablet (20 mg total) by mouth 2 (two) times daily. Patient not taking: Reported on 06/03/2016 10/24/15   Charlesetta Shanks, MD  omeprazole (PRILOSEC) 20 MG capsule Take 1 capsule (20 mg total) by mouth daily. Patient not taking: Reported on 10/24/2015 09/03/15   Hyman Bible, PA-C    Physical Exam: Filed Vitals:   06/03/16 2009 06/03/16 2019 06/03/16 2043 06/03/16 2141  BP: 129/85   132/77  Pulse: 95   95  Temp: 98.8 F (37.1 C)   98.6 F (37 C)  TempSrc: Oral   Oral  Resp: 20   20  SpO2: 99% 99% 100% 96%      Constitutional: NAD, calm, comfortable but ill appearing Filed Vitals:   06/03/16 2009 06/03/16 2019 06/03/16 2043 06/03/16 2141  BP: 129/85   132/77  Pulse: 95   95  Temp: 98.8 F (37.1 C)   98.6 F (37 C)  TempSrc: Oral   Oral  Resp: 20   20  SpO2: 99% 99% 100% 96%   Eyes: PERRL, lids and conjunctivae normal ENMT: Mucous membranes are moist. Posterior pharynx clear of any exudate or lesions.Normal dentition.  Neck: normal, supple Respiratory: Bilateral inspiratory and  expiratory wheezing.  Normal respiratory effort. No accessory muscle use.  Cardiovascular: Regular rate and rhythm, no murmurs / rubs / gallops. No extremity edema. 2+ pedal pulses. Abdomen: no tenderness, soft and compressible Musculoskeletal: no clubbing / cyanosis. No joint deformity upper and lower extremities. Good ROM, no contractures. Normal muscle tone.  Skin: no rashes, warm and dry Neurologic: CN 2-12 grossly intact. Sensation intact, Strength 5/5 in all 4.  Psychiatric: Normal judgment and insight. Alert and oriented x 3. Normal mood.     Labs on Admission: I have personally reviewed following labs and imaging studies  CBC:  Recent Labs Lab 06/03/16 1941  WBC 17.3*  NEUTROABS 11.6*  HGB 14.6  HCT 42.5  MCV 84.5  PLT [id number]   Basic Metabolic Panel:  Recent Labs Lab 06/03/16 1941  NA 136  K 3.8  CL 110  CO2 20*  GLUCOSE 96  BUN 8  CREATININE 0.66  CALCIUM 8.9   GFR: CrCl cannot be calculated (Unknown ideal weight.).  Sepsis Labs:  Lactic acid level 1.7  Radiological Exams on Admission: Dg Chest 2 View  06/03/2016  CLINICAL DATA:  Chest congestion and cough for 1 week. Shortness of breath today. Smoker. EXAM: CHEST  2 VIEW COMPARISON:  12/20/2013. FINDINGS: Interval significant right upper lobe atelectasis with superior collapse. The remainder the lungs are clear. Normal sized heart. Unremarkable bones. IMPRESSION: Interval significant right upper lobe atelectasis with partial collapse. This could be due to a centrally obstructing mass, mucous plug or non radiopaque endobronchial foreign body. Electronically Signed   By: Claudie Revering M.D.   On: 06/03/2016 19:22     Assessment/Plan Principal Problem:   Asthma exacerbation Active Problems:   Smoker   Atelectasis   Leukocytosis    Asthma exacerbation --IV solumedrol 60mg  q6h --Duonebs q4h --Empiric magnesium sulfate 2grams IV x 1 --Incentive spirometer --NS at 125 cc/hr  Abnormal chest xray, RUL  atelectasis, per ED attending, the following plan of care was discussed with pulmonary: --Empirically treat for CAP since she has a leukocytosis --Chest percussion to the RUL --Incentive spirometer use --Mucolytics --Repeat PA/lateral chest xray in the AM.  If no improvement, will need to consider additional imaging and call pulmonary back for formal consultation.   DVT prophylaxis: Low risk, early ambulation Code Status: FULL Family Communication: Husband at bedside Disposition Plan: Home when stable from respiratory standpoint Consults called: Pulmonary called by the ED attending but will need to be called back in the AM if indicated Admission status: Observation, telemetry   Eber Jones MD [facility]  If 7PM-7AM, please contact night-coverage [email or web address] Password [email or web address]  06/03/2016, 9:50 PM

## 2016-06-03 NOTE — ED Notes (Signed)
Peggy [name], Nurse Intern, provided patient water with permission from provider. Also, informed Dr. [name] that patient is requesting to speak to her about her care.

## 2016-06-03 NOTE — ED Provider Notes (Signed)
CSN: [id number]     Arrival date & time 06/03/16  1754 History   First MD Initiated Contact with Patient 06/03/16 1809     Chief Complaint  Patient presents with  . Asthma     (Consider location/radiation/quality/duration/timing/severity/associated sxs/prior Treatment) HPI Patient reports she's been asthmatic since childhood. She denies any prior history of intubation. She reports that she felt fine yesterday. She reports she had some mild nasal congestion and URI symptoms about a week ago with a seam nearly resolved. She reports this morning she started developing wheezing and cough consistent with asthma. She reports it continued to worsen and he she has tried to use her rescue inhaler 15-20 times. She states that she has not any relief and she continues to wheeze and feel short of breath. No chest pain. No fever. "Asthma cough". She does not think she had any exposure to allergens this morning. Past Medical History  Diagnosis Date  . Asthma   . Pyloric stenosis    Past Surgical History  Procedure Laterality Date  . Pyloromyotomy     Family History  Problem Relation Age of Onset  . Arthritis Mother   . Cancer Mother     thyroid  . Depression Mother   . Hyperlipidemia Father   . Asthma Father   . Hypertension Father   . COPD Maternal Grandfather   . Diabetes Paternal Grandmother   . Arthritis Paternal Grandmother   . Asthma Paternal Grandmother   . Heart disease Paternal Grandmother   . Depression Paternal Grandmother   . Hypertension Paternal Grandmother   . Other Neg Hx    Social History  Substance Use Topics  . Smoking status: Current Every Day Smoker -- 0.25 packs/day for 5 years    Types: Cigarettes  . Smokeless tobacco: Never Used  . Alcohol Use: No   OB History    Gravida Para Term Preterm AB TAB SAB Ectopic Multiple Living   2 2 2  0 0 0 0 0 0 2     Review of Systems  10 Systems reviewed and are negative for acute change except as noted in the  HPI.   Allergies  Review of patient's allergies indicates no known allergies.  Home Medications   Prior to Admission medications   Medication Sig Start Date End Date Taking? Authorizing Provider  albuterol (PROVENTIL HFA;VENTOLIN HFA) 108 (90 BASE) MCG/ACT inhaler Inhale 2 puffs into the lungs every 6 (six) hours as needed for wheezing or shortness of breath.   Yes Historical Provider, MD  dicyclomine (BENTYL) 20 MG tablet Take 1 tablet (20 mg total) by mouth 2 (two) times daily. Patient not taking: Reported on 10/24/2015 09/03/15   Hyman Bible, PA-C  famotidine (PEPCID) 20 MG tablet Take 1 tablet (20 mg total) by mouth 2 (two) times daily. Patient not taking: Reported on 06/03/2016 10/24/15   Charlesetta Shanks, MD  omeprazole (PRILOSEC) 20 MG capsule Take 1 capsule (20 mg total) by mouth daily. Patient not taking: Reported on 10/24/2015 09/03/15   Hyman Bible, PA-C   BP 129/85 mmHg  Pulse 95  Temp(Src) 98.8 F (37.1 C) (Oral)  Resp 20  SpO2 100%  LMP 05/13/2016 Physical Exam  Constitutional: She is oriented to person, place, and time. She appears well-developed and well-nourished.  Mild increased work of breathing. Patient is speaking in full short sentences. Mental status is clear. Color is good.  HENT:  Head: Normocephalic and atraumatic.  Mouth/Throat: Oropharynx is clear and moist.  Eyes: EOM are  normal. Pupils are equal, round, and reactive to light.  Neck: Neck supple.  Cardiovascular: Normal rate, regular rhythm, normal heart sounds and intact distal pulses.   Pulmonary/Chest:  Mild increased work of breathing. Diffuse wheezes throughout lung fields. Occasional dry cough.  Abdominal: Soft. Bowel sounds are normal. She exhibits no distension. There is no tenderness.  Musculoskeletal: Normal range of motion. She exhibits no edema or tenderness.  Neurological: She is alert and oriented to person, place, and time. She has normal strength. Coordination normal. GCS eye subscore  is 4. GCS verbal subscore is 5. GCS motor subscore is 6.  Skin: Skin is warm, dry and intact.  Psychiatric: She has a normal mood and affect.    ED Course  Procedures (including critical care time) Labs Review Labs Reviewed  BASIC METABOLIC PANEL - Abnormal; Notable for the following:    CO2 20 (*)    All other components within normal limits  CBC WITH DIFFERENTIAL/PLATELET - Abnormal; Notable for the following:    WBC 17.3 (*)    Neutro Abs 11.6 (*)    Lymphs Abs 4.2 (*)    All other components within normal limits  CULTURE, BLOOD (ROUTINE X 2)  CULTURE, BLOOD (ROUTINE X 2)  I-STAT BETA HCG BLOOD, ED ([facility], [facility], [facility] ONLY)  I-STAT CG4 LACTIC ACID, ED    Imaging Review Dg Chest 2 View  06/03/2016  CLINICAL DATA:  Chest congestion and cough for 1 week. Shortness of breath today. Smoker. EXAM: CHEST  2 VIEW COMPARISON:  12/20/2013. FINDINGS: Interval significant right upper lobe atelectasis with superior collapse. The remainder the lungs are clear. Normal sized heart. Unremarkable bones. IMPRESSION: Interval significant right upper lobe atelectasis with partial collapse. This could be due to a centrally obstructing mass, mucous plug or non radiopaque endobronchial foreign body. Electronically Signed   By: Claudie Revering M.D.   On: 06/03/2016 19:22   I have personally reviewed and evaluated these images and lab results as part of my medical decision-making.   EKG Interpretation None     Recheck:19:50 Patient still feels short of breath. Diffuse wheeze present on examination. Patient is alert and nontoxic. No deterioration in respiratory status.  Consult: (20:00) discussed with Dr. Oletta Darter intensivist. At this time recommends continuing treatment for asthma exacerbation and we will initiate empiric treatment for community acquired pneumonia based on leukocytosis and area of atelectasis. Recommends hospitalist admission with postural drainage and percussion for right upper lobe atelectasis  overnight.  Recheck: 21:15 patient alert and nontoxic. Vital signs stable. No deterioration in respiratory status. Mental status clear. Patient updated on plan.  Consult: (21:30) review to [facility] for admission. MDM   Final diagnoses:  Asthma exacerbation   Patient presents with history of asthma. She had an acute exacerbation this morning. Patient developed wheezing and dry cough. No fever. No chest pain. She does advise she had URI symptoms last week but they seem to be improved. The chest x-ray shows a focal area of suspected atelectasis. This is reviewed with pulmonology and plan will be for admission with empiric treatment for community-acquired pneumonia based on chest x-ray findings and leukocytosis. Dr. Oletta Darter suggests pulmonary physiotherapy and continued DuoNeb overnight with repeat chest x-ray.    Charlesetta Shanks, MD 06/03/16 2136

## 2016-06-03 NOTE — ED Notes (Signed)
Spoke with phlebotomy (lab) about obtaining blood since unsuccessful with obtaining IV or ED phlebotomy.

## 2016-06-03 NOTE — ED Notes (Signed)
Notified RT about additional neb treatments.

## 2016-06-03 NOTE — ED Notes (Signed)
MD at bedside. 

## 2016-06-03 NOTE — ED Notes (Signed)
Pt wants IV/labs done at same time.  Triage RN aware.
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
## 2016-06-04 ENCOUNTER — Observation Stay (HOSPITAL_COMMUNITY): Payer: Medicaid Other

## 2016-06-04 ENCOUNTER — Encounter (HOSPITAL_COMMUNITY): Payer: Self-pay | Admitting: Radiology

## 2016-06-04 DIAGNOSIS — F17210 Nicotine dependence, cigarettes, uncomplicated: Secondary | ICD-10-CM | POA: Diagnosis present

## 2016-06-04 DIAGNOSIS — J45901 Unspecified asthma with (acute) exacerbation: Secondary | ICD-10-CM | POA: Diagnosis not present

## 2016-06-04 DIAGNOSIS — H6690 Otitis media, unspecified, unspecified ear: Secondary | ICD-10-CM | POA: Diagnosis present

## 2016-06-04 DIAGNOSIS — Z79899 Other long term (current) drug therapy: Secondary | ICD-10-CM | POA: Diagnosis not present

## 2016-06-04 DIAGNOSIS — J9811 Atelectasis: Secondary | ICD-10-CM | POA: Diagnosis present

## 2016-06-04 DIAGNOSIS — J189 Pneumonia, unspecified organism: Secondary | ICD-10-CM | POA: Diagnosis present

## 2016-06-04 DIAGNOSIS — Z720 Tobacco use: Secondary | ICD-10-CM | POA: Diagnosis not present

## 2016-06-04 DIAGNOSIS — Z7951 Long term (current) use of inhaled steroids: Secondary | ICD-10-CM | POA: Diagnosis not present

## 2016-06-04 DIAGNOSIS — D72829 Elevated white blood cell count, unspecified: Secondary | ICD-10-CM | POA: Diagnosis not present

## 2016-06-04 LAB — URINALYSIS, ROUTINE W REFLEX MICROSCOPIC
Bilirubin Urine: NEGATIVE
Glucose, UA: NEGATIVE mg/dL
Hgb urine dipstick: NEGATIVE
Ketones, ur: NEGATIVE mg/dL
LEUKOCYTES UA: NEGATIVE
NITRITE: NEGATIVE
PH: 6 (ref 5.0–8.0)
Protein, ur: NEGATIVE mg/dL
SPECIFIC GRAVITY, URINE: 1.006 (ref 1.005–1.030)

## 2016-06-04 LAB — HCG, QUANTITATIVE, PREGNANCY: hCG, Beta Chain, Quant, S: <1 m[IU]/mL (ref <5)

## 2016-06-04 LAB — STREP PNEUMONIAE URINARY ANTIGEN: Strep Pneumo Urinary Antigen: NEGATIVE

## 2016-06-04 MED ORDER — IOPAMIDOL (ISOVUE-300) INJECTION 61%
75.0000 mL | Freq: Once | INTRAVENOUS | Status: AC | PRN
  Administered 2016-06-04: 75 mL via INTRAVENOUS

## 2016-06-04 MED ORDER — CHLORHEXIDINE GLUCONATE 0.12 % MT SOLN
15.0000 mL | Freq: Two times a day (BID) | OROMUCOSAL | Status: DC
  Administered 2016-06-04 – 2016-06-06 (×5): 15 mL via OROMUCOSAL
  Filled 2016-06-04 (×6): qty 15

## 2016-06-04 MED ORDER — ONDANSETRON HCL 4 MG/2ML IJ SOLN
4.0000 mg | Freq: Four times a day (QID) | INTRAMUSCULAR | Status: DC | PRN
  Administered 2016-06-04 – 2016-06-07 (×6): 4 mg via INTRAVENOUS
  Filled 2016-06-04 (×6): qty 2

## 2016-06-04 MED ORDER — IPRATROPIUM-ALBUTEROL 0.5-2.5 (3) MG/3ML IN SOLN
3.0000 mL | RESPIRATORY_TRACT | Status: DC
  Administered 2016-06-04 – 2016-06-05 (×5): 3 mL via RESPIRATORY_TRACT
  Filled 2016-06-04 (×6): qty 3

## 2016-06-04 MED ORDER — ACETYLCYSTEINE 20 % IN SOLN
4.0000 mL | RESPIRATORY_TRACT | Status: DC
  Filled 2016-06-04 (×3): qty 4

## 2016-06-04 MED ORDER — IPRATROPIUM-ALBUTEROL 0.5-2.5 (3) MG/3ML IN SOLN: 3.0000 mL | Freq: Four times a day (QID) | RESPIRATORY_TRACT | Status: DC

## 2016-06-04 MED ORDER — ALUM & MAG HYDROXIDE-SIMETH 200-200-20 MG/5ML PO SUSP
30.0000 mL | Freq: Four times a day (QID) | ORAL | Status: DC | PRN
  Administered 2016-06-04 – 2016-06-07 (×6): 30 mL via ORAL
  Filled 2016-06-04 (×7): qty 30

## 2016-06-04 MED ORDER — ZOLPIDEM TARTRATE 5 MG PO TABS
5.0000 mg | ORAL_TABLET | Freq: Once | ORAL | Status: AC
  Administered 2016-06-04: 5 mg via ORAL
  Filled 2016-06-04: qty 1

## 2016-06-04 MED ORDER — SODIUM CHLORIDE 0.9 % IV SOLN
INTRAVENOUS | Status: AC
  Administered 2016-06-04 – 2016-06-05 (×2): via INTRAVENOUS

## 2016-06-04 MED ORDER — ENOXAPARIN SODIUM 40 MG/0.4ML ~~LOC~~ SOLN
40.0000 mg | SUBCUTANEOUS | Status: DC
  Administered 2016-06-04: 40 mg via SUBCUTANEOUS
  Filled 2016-06-04 (×4): qty 0.4

## 2016-06-04 MED ORDER — ACETYLCYSTEINE 20 % IN SOLN
4.0000 mL | RESPIRATORY_TRACT | Status: DC
  Filled 2016-06-04 (×2): qty 4

## 2016-06-04 MED ORDER — CETYLPYRIDINIUM CHLORIDE 0.05 % MT LIQD: 7.0000 mL | Freq: Two times a day (BID) | OROMUCOSAL | Status: DC

## 2016-06-04 MED ORDER — ACETYLCYSTEINE 20 % IN SOLN
4.0000 mL | RESPIRATORY_TRACT | Status: AC
Start: 2016-06-04 — End: 2016-06-05
  Administered 2016-06-04: 4 mL via RESPIRATORY_TRACT
  Filled 2016-06-04 (×2): qty 4

## 2016-06-04 NOTE — Progress Notes (Signed)
Pt given Incentive Spirometer and instructed on use. Pt also encouraged to ambulate in the halls per MD order. Pt sitting up in the chair while awake per MD orders.   Othella Boyer [facility] 06/04/2016 2:56 PM

## 2016-06-04 NOTE — Progress Notes (Signed)
PT demonstrated verbal and hands on understanding of flutter device.

## 2016-06-04 NOTE — Progress Notes (Signed)
PT utilizing Flutter. No questions / concerns related to this therapy at his time. PT has small, white PC at this time.

## 2016-06-04 NOTE — Progress Notes (Addendum)
PROGRESS NOTE                                                                                                                                                                                                             Patient Demographics:    Peggy [name], is a 25 y.o. female, DOB - 1991-08-27, OE:[id number]  Admit date - 06/03/2016   Admitting Physician Lily Kocher, MD  Outpatient Primary MD for the patient is Pcp Not In System  LOS -   Chief Complaint  Patient presents with  . Asthma       Brief Narrative Peggy [name] is a 25 y.o. woman with a History of asthma (typically well controlled, only uses a rescue inhaler prn, has never been intubated or even hospitalized until now) who reports URI symptoms of nasal congestion, post nasal drip, and dry cough approximately one week ago. No fever, chills, or sweats. She felt improved until this morning when she developed increased wheezing, refractory to increased use of her albuterol rescue inhaler ("I used it 10 or 15 times"). She attempted to use her daughter's nebulizer machine, but it was malfunctioning. She presented to the ED for further evaluation and treatment.  ED Course: Bilateral inspiratory and expiratory wheezing improved but not resolved after duonebs x 2 and IV solumedrol. Chest xray concerning for RUL atelectasis. No history of aspiration. This finding was discussed with pulmonary attending (Dr. Emmit Alexanders) per the ED attending. Hospitalization for further evaluation and treatment recommended. Because she also has a leukocytosis, we will go ahead and treat empirically with Rocephin and azithromycin.   Subjective:    Peggy [name] today has, No headache, No chest pain, No abdominal pain - No Nausea, No new weakness tingling or numbness, No Cough - SOB. States she is improving.   Assessment  & Plan :     1.Asthma exacerbation and  possible early CAP - . Placed on IV solumedrol 60mg  q6h, Duonebs q4h, Magnesium sulfate 2 g IV once, nebulizer treatments, Incentive spirometer, and hydration of NS 125 cc/hr. Will continue with current treatment. Continue empiric antibiotics for possible early pneumonia hiding under atelectasis with impressive leukocytosis, already much improved. If better tomorrow change steroids to oral dosing and possible d/c.  2.Smoking tobacco counseled to quit smoking  3. Atelectasis Chest x-ray showed right upper lobe atelectasis with partial  collapse. CT chest w contrast was preformed, showing patchy atelectasis right upper and middle lobes. CBC showed leukocytosis. Placed on chest percussion, incentive spirometer, and mucolytics. Gave chest vibrator treatment and added flutter valve therapy. Atelectasis is improving, repeat 2 view chest x-ray in the morning.    Code Status :  Full  Family Communication  :  Husband in room  Disposition Plan  :  Discharge in 1 day none  Consults  : none  Procedures  :    CT Chest w Contrast:  improving atelectasis and collapse  Pa/Lateral Chest x-ray: Right upper lobe collapse     DVT Prophylaxis  : lovenox  Lab Results  Component Value Date   PLT 363 06/03/2016    Inpatient Medications  Scheduled Meds: . sodium chloride   Intravenous STAT  . acetylcysteine  4 mL Nebulization Q4H  . antiseptic oral rinse  7 mL Mouth Rinse q12n4p  . azithromycin  500 mg Oral Q24H  . cefTRIAXone (ROCEPHIN)  IV  1 g Intravenous Q24H  . chlorhexidine  15 mL Mouth Rinse BID  . guaiFENesin  600 mg Oral BID  . ipratropium-albuterol  3 mL Nebulization Q4H  . methylPREDNISolone (SOLU-MEDROL) injection  60 mg Intravenous Q6H   Continuous Infusions:  PRN Meds:.acetaminophen  Antibiotics  :    Anti-infectives    Start     Dose/Rate Route Frequency Ordered Stop   06/04/16 2200  azithromycin (ZITHROMAX) tablet 500 mg     500 mg Oral Every 24 hours 06/03/16 2236  06/11/16 2159   06/04/16 2000  cefTRIAXone (ROCEPHIN) 1 g in dextrose 5 % 50 mL IVPB     1 g 100 mL/hr over 30 Minutes Intravenous Every 24 hours 06/03/16 2236 06/11/16 1959   06/03/16 2000  cefTRIAXone (ROCEPHIN) 1 g in dextrose 5 % 50 mL IVPB     1 g 100 mL/hr over 30 Minutes Intravenous  Once 06/03/16 1959 06/03/16 2117   06/03/16 2000  azithromycin (ZITHROMAX) 500 mg in dextrose 5 % 250 mL IVPB     500 mg 250 mL/hr over 60 Minutes Intravenous  Once 06/03/16 1959 06/03/16 2241         Objective:   Filed Vitals:   06/03/16 2328 06/04/16 0319 06/04/16 0551 06/04/16 0830  BP:   120/73   Pulse:   68   Temp:   98.2 F (36.8 C)   TempSrc:   Oral   Resp:   20   Height:      Weight:      SpO2: 99% 97% 98% 96%    Wt Readings from Last 3 Encounters:  06/03/16 70.716 kg (155 lb 14.4 oz)  09/05/15 47.741 kg (105 lb 4 oz)  09/03/15 68.04 kg (150 lb)     Intake/Output Summary (Last 24 hours) at 06/04/16 1041 Last data filed at 06/04/16 0800  Gross per 24 hour  Intake 1018.33 ml  Output      0 ml  Net 1018.33 ml     Physical Exam  Awake Alert, Oriented X 3, No new F.N deficits, Normal affect [place].AT,PERRAL Supple Neck,No JVD, No cervical lymphadenopathy appriciated.  Symmetrical Chest wall movement, Good air movement bilaterally, mild wheezing. RRR,No Gallops,Rubs or new Murmurs, No Parasternal Heave +ve B.Sounds, Abd Soft, No tenderness, No organomegaly appriciated, No rebound - guarding or rigidity. No Cyanosis, Clubbing or edema, No new Rash or bruise     Data Review:    CBC  Recent Labs Lab 06/03/16 1941  WBC 17.3*  HGB 14.6  HCT 42.5  PLT 363  MCV 84.5  MCH 29.0  MCHC 34.4  RDW 13.8  LYMPHSABS 4.2*  MONOABS 0.9  EOSABS 0.5  BASOSABS 0.0    Chemistries   Recent Labs Lab 06/03/16 1941  NA 136  K 3.8  CL 110  CO2 20*  GLUCOSE 96  BUN 8  CREATININE 0.66  CALCIUM 8.9    ------------------------------------------------------------------------------------------------------------------ No results for input(s): CHOL, HDL, LDLCALC, TRIG, CHOLHDL, LDLDIRECT in the last 72 hours.  No results found for: HGBA1C ------------------------------------------------------------------------------------------------------------------ No results for input(s): TSH, T4TOTAL, T3FREE, THYROIDAB in the last 72 hours.  Invalid input(s): FREET3 ------------------------------------------------------------------------------------------------------------------ No results for input(s): VITAMINB12, FOLATE, FERRITIN, TIBC, IRON, RETICCTPCT in the last 72 hours.  Coagulation profile No results for input(s): INR, PROTIME in the last 168 hours.  No results for input(s): DDIMER in the last 72 hours.  Cardiac Enzymes No results for input(s): CKMB, TROPONINI, MYOGLOBIN in the last 168 hours.  Invalid input(s): CK ------------------------------------------------------------------------------------------------------------------ No results found for: BNP  Micro Results No results found for this or any previous visit (from the past 240 hour(s)).  Radiology Reports Dg Chest 2 View  06/04/2016  CLINICAL DATA:  Cough congestion shortness of breath with intermittent chest pain EXAM: CHEST  2 VIEW COMPARISON:  06/03/2016 FINDINGS: The heart size and vascular pattern are normal. The left lung remains clear. There is significantly improved aeration in the right upper lobe with some residual subsegmental atelectasis toward the inferior aspect of the right upper lobe. There are no pleural effusions. Hilar and mediastinal contours appear within normal limits. IMPRESSION: Improved aeration with residual subsegmental right upper lobe atelectasis. No definite evidence of central obstructing lesion, but follow-up radiograph in 1-2 weeks is recommended to insure restoration of normal aeration. Electronically  Signed   By: Skipper Cliche M.D.   On: 06/04/2016 08:47   Dg Chest 2 View  06/03/2016  CLINICAL DATA:  Chest congestion and cough for 1 week. Shortness of breath today. Smoker. EXAM: CHEST  2 VIEW COMPARISON:  12/20/2013. FINDINGS: Interval significant right upper lobe atelectasis with superior collapse. The remainder the lungs are clear. Normal sized heart. Unremarkable bones. IMPRESSION: Interval significant right upper lobe atelectasis with partial collapse. This could be due to a centrally obstructing mass, mucous plug or non radiopaque endobronchial foreign body. Electronically Signed   By: Claudie Revering M.D.   On: 06/03/2016 19:22   Ct Chest W Contrast  06/04/2016  CLINICAL DATA:  Shortness of breath and cough EXAM: CT CHEST WITH CONTRAST TECHNIQUE: Multidetector CT imaging of the chest was performed during intravenous contrast administration. CONTRAST:  58mL ISOVUE-300 IOPAMIDOL (ISOVUE-300) INJECTION 61% COMPARISON:  Chest radiograph June 04, 2016 FINDINGS: Mediastinum/Lymph Nodes: Visualized thyroid appears normal. There is no demonstrable thoracic adenopathy. Residual thymic tissue may be normal for age. No major vessel pulmonary embolus is evident. There is no thoracic aortic aneurysm or dissection. The visualized great vessels appear normal. Pericardium is not thickened. Lungs/Pleura: There is right upper lobe atelectatic change. There is also atelectatic change in the right middle lobe. Left lung is clear. There is no edema or frank consolidation. Upper abdomen: Visualized upper abdominal structures appear unremarkable. Musculoskeletal:  There are no blastic or lytic bone lesions. IMPRESSION: Areas of patchy atelectasis in the right upper and right middle lobe regions. Lungs elsewhere clear. No adenopathy. No pneumothorax or pneumomediastinum. No vascular lesions evident. Mild apparent thymic tissue may be normal for age. Electronically Signed  By: Lowella Grip III M.D.   On: 06/04/2016 08:12      Time Spent in minutes  30  Signature  Tristine Langi K M.D on 06/04/2016 at 11:38 AM  Between 7am to 7pm - Pager - [phone], After 7pm go to [email or web address] - password [facility]  [facility]  - Office  [phone]

## 2016-06-05 ENCOUNTER — Inpatient Hospital Stay (HOSPITAL_COMMUNITY): Payer: Medicaid Other

## 2016-06-05 LAB — CBC
HEMATOCRIT: 39.9 % (ref 36.0–46.0)
HEMOGLOBIN: 13.4 g/dL (ref 12.0–15.0)
MCH: 28.6 pg (ref 26.0–34.0)
MCHC: 33.6 g/dL (ref 30.0–36.0)
MCV: 85.1 fL (ref 78.0–100.0)
Platelets: 365 10*3/uL (ref 150–400)
RBC: 4.69 MIL/uL (ref 3.87–5.11)
RDW: 14.1 % (ref 11.5–15.5)
WBC: 21.3 10*3/uL — ABNORMAL HIGH (ref 4.0–10.5)

## 2016-06-05 LAB — URINE CULTURE: Culture: 5000 — AB

## 2016-06-05 MED ORDER — ZOLPIDEM TARTRATE 5 MG PO TABS
5.0000 mg | ORAL_TABLET | Freq: Every evening | ORAL | Status: DC | PRN
  Administered 2016-06-05 – 2016-06-07 (×3): 5 mg via ORAL
  Filled 2016-06-05 (×3): qty 1

## 2016-06-05 MED ORDER — IPRATROPIUM-ALBUTEROL 0.5-2.5 (3) MG/3ML IN SOLN
3.0000 mL | Freq: Four times a day (QID) | RESPIRATORY_TRACT | Status: DC
  Administered 2016-06-05 – 2016-06-06 (×4): 3 mL via RESPIRATORY_TRACT
  Filled 2016-06-05 (×4): qty 3

## 2016-06-05 MED ORDER — TRAMADOL HCL 50 MG PO TABS
25.0000 mg | ORAL_TABLET | Freq: Four times a day (QID) | ORAL | Status: DC | PRN
  Administered 2016-06-05 – 2016-06-08 (×7): 25 mg via ORAL
  Filled 2016-06-05 (×7): qty 1

## 2016-06-05 MED ORDER — ALBUTEROL SULFATE (2.5 MG/3ML) 0.083% IN NEBU: 2.5000 mg | INHALATION_SOLUTION | RESPIRATORY_TRACT | Status: DC | PRN

## 2016-06-05 NOTE — Progress Notes (Signed)
PROGRESS NOTE                                                                                                                                                                                                             Patient Demographics:    Peggy [name], is a 25 y.o. female, DOB - Mar 20, 1991, OE:[id number]  Admit date - 06/03/2016   Admitting Physician Lily Kocher, MD  Outpatient Primary MD for the patient is Pcp Not In System  LOS - 1  Chief Complaint  Patient presents with  . Asthma       Brief Narrative Peggy [name] is a 25 y.o. woman with a History of asthma (typically well controlled, only uses a rescue inhaler prn, has never been intubated or even hospitalized until now) who reports URI symptoms of nasal congestion, post nasal drip, and dry cough approximately one week ago. No fever, chills, or sweats. She felt improved until this morning when she developed increased wheezing, refractory to increased use of her albuterol rescue inhaler ("I used it 10 or 15 times"). She attempted to use her daughter's nebulizer machine, but it was malfunctioning. She presented to the ED for further evaluation and treatment.  ED Course: Bilateral inspiratory and expiratory wheezing improved but not resolved after duonebs x 2 and IV solumedrol. Chest xray concerning for RUL atelectasis. No history of aspiration. This finding was discussed with pulmonary attending (Dr. Emmit Alexanders) per the ED attending. Hospitalization for further evaluation and treatment recommended. Because she also has a leukocytosis, we will go ahead and treat empirically with Rocephin and azithromycin.   Subjective:    [name] still with SOB, not at baseline. Report generalized pain and pain after she cough/    Assessment  & Plan :     1.Asthma exacerbation and possible early CAP - . Placed on IV solumedrol 60mg  q6h, Duonebs q4h,  , nebulizer treatments, Incentive spirometer,  Will continue with current treatment. Continue empiric antibiotics for possible early pneumonia.  Follow WBC>  Still with BL wheezing and not at baseline. Continue with IV steroids.   2.Smoking tobacco counseled to quit smoking  3. Atelectasis Chest x-ray showed right upper lobe atelectasis with partial collapse. CT chest w contrast was preformed, showing patchy atelectasis right upper and middle lobes. CBC showed leukocytosis. Placed on chest percussion, incentive spirometer, and  mucolytics. Gave chest vibrator treatment and added flutter valve therapy. Atelectasis is improving,  Chest x ray clear this am.     Code Status :  Full  Family Communication  : patient.   Disposition Plan  : home tomorrow.   Consults  : none  Procedures  :    CT Chest w Contrast:  improving atelectasis and collapse  Pa/Lateral Chest x-ray: Right upper lobe collapse     DVT Prophylaxis  : lovenox  Lab Results  Component Value Date   PLT 365 06/05/2016    Inpatient Medications  Scheduled Meds: . antiseptic oral rinse  7 mL Mouth Rinse q12n4p  . azithromycin  500 mg Oral Q24H  . cefTRIAXone (ROCEPHIN)  IV  1 g Intravenous Q24H  . chlorhexidine  15 mL Mouth Rinse BID  . enoxaparin (LOVENOX) injection  40 mg Subcutaneous Q24H  . guaiFENesin  600 mg Oral BID  . ipratropium-albuterol  3 mL Nebulization Q4H  . methylPREDNISolone (SOLU-MEDROL) injection  60 mg Intravenous Q6H   Continuous Infusions: . sodium chloride 75 mL/hr at 06/05/16 0616   PRN Meds:.acetaminophen, albuterol, alum & mag hydroxide-simeth, ondansetron (ZOFRAN) IV, traMADol  Antibiotics  :    Anti-infectives    Start     Dose/Rate Route Frequency Ordered Stop   06/04/16 2200  azithromycin (ZITHROMAX) tablet 500 mg     500 mg Oral Every 24 hours 06/03/16 2236 06/11/16 2159   06/04/16 2000  cefTRIAXone (ROCEPHIN) 1 g in dextrose 5 % 50 mL IVPB     1 g 100 mL/hr over 30  Minutes Intravenous Every 24 hours 06/03/16 2236 06/11/16 1959   06/03/16 2000  cefTRIAXone (ROCEPHIN) 1 g in dextrose 5 % 50 mL IVPB     1 g 100 mL/hr over 30 Minutes Intravenous  Once 06/03/16 1959 06/03/16 2117   06/03/16 2000  azithromycin (ZITHROMAX) 500 mg in dextrose 5 % 250 mL IVPB     500 mg 250 mL/hr over 60 Minutes Intravenous  Once 06/03/16 1959 06/03/16 2241         Objective:   Filed Vitals:   06/04/16 2056 06/04/16 2150 06/05/16 0500 06/05/16 0843  BP:  125/78 123/72   Pulse:  86 82 124  Temp:  98.3 F (36.8 C) 98.1 F (36.7 C)   TempSrc:  Oral Oral   Resp:  20 20 20   Height:      Weight:      SpO2: 98% 98% 99% 97%    Wt Readings from Last 3 Encounters:  06/03/16 70.716 kg (155 lb 14.4 oz)  09/05/15 47.741 kg (105 lb 4 oz)  09/03/15 68.04 kg (150 lb)     Intake/Output Summary (Last 24 hours) at 06/05/16 1048 Last data filed at 06/05/16 0616  Gross per 24 hour  Intake 1836.25 ml  Output      0 ml  Net 1836.25 ml     Physical Exam  Awake Alert, Oriented X 3, No new F.N deficits, Normal affect [place].AT,PERRAL Supple Neck,No JVD, No cervical lymphadenopathy appriciated.  Symmetrical Chest wall movement, Good air movement bilaterally, bilateral expiratory wheezing.  RRR,No Gallops,Rubs or new Murmurs, No Parasternal Heave +ve B.Sounds, Abd Soft, No tenderness, No organomegaly appriciated, No rebound - guarding or rigidity. No Cyanosis, Clubbing or edema, No new Rash or bruise     Data Review:    CBC  Recent Labs Lab 06/03/16 1941 06/05/16 0526  WBC 17.3* 21.3*  HGB 14.6 13.4  HCT 42.5 39.9  PLT 363 365  MCV 84.5 85.1  MCH 29.0 28.6  MCHC 34.4 33.6  RDW 13.8 14.1  LYMPHSABS 4.2*  --   MONOABS 0.9  --   EOSABS 0.5  --   BASOSABS 0.0  --     Chemistries   Recent Labs Lab 06/03/16 1941  NA 136  K 3.8  CL 110  CO2 20*  GLUCOSE 96  BUN 8  CREATININE 0.66  CALCIUM 8.9    ------------------------------------------------------------------------------------------------------------------ No results for input(s): CHOL, HDL, LDLCALC, TRIG, CHOLHDL, LDLDIRECT in the last 72 hours.  No results found for: HGBA1C ------------------------------------------------------------------------------------------------------------------ No results for input(s): TSH, T4TOTAL, T3FREE, THYROIDAB in the last 72 hours.  Invalid input(s): FREET3 ------------------------------------------------------------------------------------------------------------------ No results for input(s): VITAMINB12, FOLATE, FERRITIN, TIBC, IRON, RETICCTPCT in the last 72 hours.  Coagulation profile No results for input(s): INR, PROTIME in the last 168 hours.  No results for input(s): DDIMER in the last 72 hours.  Cardiac Enzymes No results for input(s): CKMB, TROPONINI, MYOGLOBIN in the last 168 hours.  Invalid input(s): CK ------------------------------------------------------------------------------------------------------------------ No results found for: BNP  Micro Results Recent Results (from the past 240 hour(s))  Culture, blood (routine x 2)     Status: None (Preliminary result)   Collection Time: 06/03/16  8:39 PM  Result Value Ref Range Status   Specimen Description BLOOD LEFT HAND  Final   Special Requests BOTTLES DRAWN AEROBIC AND ANAEROBIC 5CC  Final   Culture   Final    NO GROWTH < 24 HOURS Performed at [facility]    Report Status PENDING  Incomplete  Culture, blood (routine x 2)     Status: None (Preliminary result)   Collection Time: 06/03/16  8:51 PM  Result Value Ref Range Status   Specimen Description BLOOD RIGHT HAND  Final   Special Requests BOTTLES DRAWN AEROBIC AND ANAEROBIC 5CC  Final   Culture   Final    NO GROWTH < 24 HOURS Performed at [facility]    Report Status PENDING  Incomplete  Urine culture     Status: Abnormal   Collection Time:  06/04/16  8:01 AM  Result Value Ref Range Status   Specimen Description URINE, CLEAN CATCH  Final   Special Requests NONE  Final   Culture (A)  Final    5,000 COLONIES/mL INSIGNIFICANT GROWTH Performed at [facility]    Report Status 06/05/2016 FINAL  Final    Radiology Reports Dg Chest 2 View  06/05/2016  CLINICAL DATA:  Cough, on antibiotics for possible pneumonia EXAM: CHEST  2 VIEW COMPARISON:  CT chest dated 06/04/2016 FINDINGS: Platelike atelectasis in the right upper lobe. No focal consolidation. No pleural effusion or pneumothorax. The heart is normal in size. Visualized osseous structures are within normal limits. IMPRESSION: No evidence of acute cardiopulmonary disease. Electronically Signed   By: Julian Hy M.D.   On: 06/05/2016 08:35   Dg Chest 2 View  06/04/2016  CLINICAL DATA:  Cough congestion shortness of breath with intermittent chest pain EXAM: CHEST  2 VIEW COMPARISON:  06/03/2016 FINDINGS: The heart size and vascular pattern are normal. The left lung remains clear. There is significantly improved aeration in the right upper lobe with some residual subsegmental atelectasis toward the inferior aspect of the right upper lobe. There are no pleural effusions. Hilar and mediastinal contours appear within normal limits. IMPRESSION: Improved aeration with residual subsegmental right upper lobe atelectasis. No definite evidence of central obstructing lesion, but follow-up radiograph in 1-2  weeks is recommended to insure restoration of normal aeration. Electronically Signed   By: Skipper Cliche M.D.   On: 06/04/2016 08:47   Dg Chest 2 View  06/03/2016  CLINICAL DATA:  Chest congestion and cough for 1 week. Shortness of breath today. Smoker. EXAM: CHEST  2 VIEW COMPARISON:  12/20/2013. FINDINGS: Interval significant right upper lobe atelectasis with superior collapse. The remainder the lungs are clear. Normal sized heart. Unremarkable bones. IMPRESSION: Interval significant  right upper lobe atelectasis with partial collapse. This could be due to a centrally obstructing mass, mucous plug or non radiopaque endobronchial foreign body. Electronically Signed   By: Claudie Revering M.D.   On: 06/03/2016 19:22   Ct Chest W Contrast  06/04/2016  CLINICAL DATA:  Shortness of breath and cough EXAM: CT CHEST WITH CONTRAST TECHNIQUE: Multidetector CT imaging of the chest was performed during intravenous contrast administration. CONTRAST:  51mL ISOVUE-300 IOPAMIDOL (ISOVUE-300) INJECTION 61% COMPARISON:  Chest radiograph June 04, 2016 FINDINGS: Mediastinum/Lymph Nodes: Visualized thyroid appears normal. There is no demonstrable thoracic adenopathy. Residual thymic tissue may be normal for age. No major vessel pulmonary embolus is evident. There is no thoracic aortic aneurysm or dissection. The visualized great vessels appear normal. Pericardium is not thickened. Lungs/Pleura: There is right upper lobe atelectatic change. There is also atelectatic change in the right middle lobe. Left lung is clear. There is no edema or frank consolidation. Upper abdomen: Visualized upper abdominal structures appear unremarkable. Musculoskeletal:  There are no blastic or lytic bone lesions. IMPRESSION: Areas of patchy atelectasis in the right upper and right middle lobe regions. Lungs elsewhere clear. No adenopathy. No pneumothorax or pneumomediastinum. No vascular lesions evident. Mild apparent thymic tissue may be normal for age. Electronically Signed   By: Lowella Grip III M.D.   On: 06/04/2016 08:12    Time Spent in minutes  30  Signature  Madoc Holquin A M.D on 06/05/2016 at 10:48 AM [phone] Between 7am to 7pm - Pager - [phone], After 7pm go to [email or web address] - password [facility]

## 2016-06-06 LAB — LEGIONELLA PNEUMOPHILA SEROGP 1 UR AG: L. PNEUMOPHILA SEROGP 1 UR AG: NEGATIVE

## 2016-06-06 MED ORDER — IPRATROPIUM BROMIDE 0.02 % IN SOLN
0.5000 mg | Freq: Three times a day (TID) | RESPIRATORY_TRACT | Status: DC
  Administered 2016-06-06 – 2016-06-07 (×4): 0.5 mg via RESPIRATORY_TRACT
  Filled 2016-06-06 (×5): qty 2.5

## 2016-06-06 MED ORDER — LEVALBUTEROL HCL 0.63 MG/3ML IN NEBU
0.6300 mg | INHALATION_SOLUTION | Freq: Three times a day (TID) | RESPIRATORY_TRACT | Status: DC
  Administered 2016-06-06 – 2016-06-08 (×5): 0.63 mg via RESPIRATORY_TRACT
  Filled 2016-06-06 (×6): qty 3

## 2016-06-06 MED ORDER — DM-GUAIFENESIN ER 30-600 MG PO TB12
1.0000 | ORAL_TABLET | Freq: Two times a day (BID) | ORAL | Status: DC
  Administered 2016-06-06 – 2016-06-07 (×4): 1 via ORAL
  Filled 2016-06-06 (×4): qty 1

## 2016-06-06 MED ORDER — LEVALBUTEROL HCL 1.25 MG/0.5ML IN NEBU
0.6300 mg | INHALATION_SOLUTION | Freq: Three times a day (TID) | RESPIRATORY_TRACT | Status: DC
  Filled 2016-06-06 (×3): qty 0.26

## 2016-06-06 MED ORDER — CIPROFLOXACIN-DEXAMETHASONE 0.3-0.1 % OT SUSP
4.0000 [drp] | Freq: Two times a day (BID) | OTIC | Status: DC
  Administered 2016-06-06 – 2016-06-07 (×4): 4 [drp] via OTIC
  Filled 2016-06-06: qty 7.5

## 2016-06-06 MED ORDER — METHYLPREDNISOLONE SODIUM SUCC 125 MG IJ SOLR
80.0000 mg | Freq: Four times a day (QID) | INTRAMUSCULAR | Status: DC
Start: 2016-06-06 — End: 2016-06-08
  Administered 2016-06-06 – 2016-06-08 (×8): 80 mg via INTRAVENOUS
  Filled 2016-06-06 (×8): qty 2

## 2016-06-06 NOTE — Care Management Note (Signed)
Case Management Note  Patient Details  Name: Peggy [name] MRN: [id number] Date of Birth: 12-09-1991  Subjective/Objective:25 y/o f admitted w/asthma. From home. No insurance, doesn't work, no pcp. Financial counselor will follow-medicaid pending. Provided patient w/pcp listing-chose [facility] will contact [facility] for appt-currently they have no appts available. [facility]-left message. Await response.Provided w/[facility] [employer], [employer] med list.                   Action/Plan:d/c plan home.   Expected Discharge Date:   (unknown)               Expected Discharge Plan:  Home/Self Care  In-House Referral:     Discharge planning Services  CM Consult, [facility]  Post Acute Care Choice:    Choice offered to:     DME Arranged:    DME Agency:     HH Arranged:    HH Agency:     Status of Service:  In process, will continue to follow  Medicare Important Message Given:    Date Medicare IM Given:    Medicare IM give by:    Date Additional Medicare IM Given:    Additional Medicare Important Message give by:     If discussed at [place] of Stay Meetings, dates discussed:    Additional Comments:  Dessa Phi, RN 06/06/2016, 1:09 PM

## 2016-06-06 NOTE — Care Management Note (Signed)
Case Management Note  Patient Details  Name: Peggy [name] MRN: [id number] Date of Birth: [birth year]  Subjective/Objective: PCP appt set @ [facility], patient aware to got to [facility] pharmacy for meds @ d/c-patient voiced understanding.                   Action/Plan:d/c plan home.   Expected Discharge Date:   (unknown)               Expected Discharge Plan:  Home/Self Care  In-House Referral:     Discharge planning Services  CM Consult, [facility]  Post Acute Care Choice:    Choice offered to:     DME Arranged:    DME Agency:     HH Arranged:    HH Agency:     Status of Service:  In process, will continue to follow  Medicare Important Message Given:    Date Medicare IM Given:    Medicare IM give by:    Date Additional Medicare IM Given:    Additional Medicare Important Message give by:     If discussed at [place] of Stay Meetings, dates discussed:    Additional Comments:  Dessa Phi, RN 06/06/2016, 1:57 PM

## 2016-06-06 NOTE — Clinical Documentation Improvement (Signed)
Internal Medicine  Please clarify the type of asthma your patient has and document findings in next progress note. Thank you!   Chronic obstructive bronchitis with COPD - patient is a 5 year smoker  Intermittent - mild, moderate, severe  Persistent - mild, moderate, severe   Other Type  Clinically Undetermined  Supporting Information:  Being treated with IV Solumedrol q6h  Duonebs 4 times daily  Please exercise your independent, professional judgment when responding. A specific answer is not anticipated or expected.  Thank You, Zoila Shutter RN, BSN, [place] [phone]; Cell: [phone]

## 2016-06-06 NOTE — Progress Notes (Signed)
PROGRESS NOTE                                                                                                                                                                                                             Patient Demographics:    Peggy [name], is a 25 y.o. female, DOB - 01-Apr-1991, OE:[id number]  Admit date - 06/03/2016   Admitting Physician Lily Kocher, MD  Outpatient Primary MD for the patient is Pcp Not In System  LOS - 2  Chief Complaint  Patient presents with  . Asthma       Brief Narrative Peggy [name] is a 25 y.o. woman with a History of asthma (typically well controlled, only uses a rescue inhaler prn, has never been intubated or even hospitalized until now) who reports URI symptoms of nasal congestion, post nasal drip, and dry cough approximately one week ago. No fever, chills, or sweats. She felt improved until this morning when she developed increased wheezing, refractory to increased use of her albuterol rescue inhaler ("I used it 10 or 15 times"). She attempted to use her daughter's nebulizer machine, but it was malfunctioning. She presented to the ED for further evaluation and treatment.  ED Course: Bilateral inspiratory and expiratory wheezing improved but not resolved after duonebs x 2 and IV solumedrol. Chest xray concerning for RUL atelectasis. No history of aspiration. This finding was discussed with pulmonary attending (Dr. Emmit Alexanders) per the ED attending. Hospitalization for further evaluation and treatment recommended. Because she also has a leukocytosis, we will go ahead and treat empirically with Rocephin and azithromycin.   Subjective:    [name] still with SOB, not at baseline. Report generalized pain and pain after she cough/    Assessment  & Plan :     1.Asthma exacerbation and possible early CAP - . Placed on IV solumedrol 60mg  q6h, Duonebs q4h,  , nebulizer treatments, Incentive spirometer,  Will continue with current treatment. Continue empiric antibiotics for possible early pneumonia.  Follow WBC>  Still with BL wheezing and not at baseline. Will increase IV  IV steroids 80 to Q 6 hours.   Add guaifenesin and dextromethorphan/  Would avoid mucomyst to avoid bronchospasm.   2.Smoking tobacco counseled to quit smoking  3. Atelectasis Chest x-ray showed right upper lobe atelectasis with partial collapse. CT chest w contrast  was preformed, showing patchy atelectasis right upper and middle lobes. CBC showed leukocytosis. Placed on chest percussion, incentive spirometer, and mucolytics. Gave chest vibrator treatment and added flutter valve therapy. Atelectasis is improving,  Chest x ray clear 6-07  4-otitis; start cipro-dexa drop/   Code Status :  Full  Family Communication  : patient.   Disposition Plan  : home tomorrow if lung exam without significant wheezing.   Consults  : none  Procedures  :    CT Chest w Contrast:  improving atelectasis and collapse  Pa/Lateral Chest x-ray: Right upper lobe collapse     DVT Prophylaxis  : lovenox  Lab Results  Component Value Date   PLT 365 06/05/2016    Inpatient Medications  Scheduled Meds: . antiseptic oral rinse  7 mL Mouth Rinse q12n4p  . azithromycin  500 mg Oral Q24H  . cefTRIAXone (ROCEPHIN)  IV  1 g Intravenous Q24H  . chlorhexidine  15 mL Mouth Rinse BID  . ciprofloxacin-dexamethasone  4 drop Both Ears BID  . dextromethorphan-guaiFENesin  1 tablet Oral BID  . enoxaparin (LOVENOX) injection  40 mg Subcutaneous Q24H  . ipratropium-albuterol  3 mL Nebulization QID  . methylPREDNISolone (SOLU-MEDROL) injection  80 mg Intravenous Q6H   Continuous Infusions:   PRN Meds:.acetaminophen, albuterol, alum & mag hydroxide-simeth, ondansetron (ZOFRAN) IV, traMADol, zolpidem  Antibiotics  :    Anti-infectives    Start     Dose/Rate Route Frequency Ordered Stop    06/04/16 2200  azithromycin (ZITHROMAX) tablet 500 mg     500 mg Oral Every 24 hours 06/03/16 2236 06/11/16 2159   06/04/16 2000  cefTRIAXone (ROCEPHIN) 1 g in dextrose 5 % 50 mL IVPB     1 g 100 mL/hr over 30 Minutes Intravenous Every 24 hours 06/03/16 2236 06/11/16 1959   06/03/16 2000  cefTRIAXone (ROCEPHIN) 1 g in dextrose 5 % 50 mL IVPB     1 g 100 mL/hr over 30 Minutes Intravenous  Once 06/03/16 1959 06/03/16 2117   06/03/16 2000  azithromycin (ZITHROMAX) 500 mg in dextrose 5 % 250 mL IVPB     500 mg 250 mL/hr over 60 Minutes Intravenous  Once 06/03/16 1959 06/03/16 2241         Objective:   Filed Vitals:   06/05/16 1948 06/05/16 2200 06/06/16 0505 06/06/16 1132  BP:  125/70 123/66   Pulse:  85 95 115  Temp:  98 F (36.7 C) 98 F (36.7 C)   TempSrc:  Oral Oral   Resp:  18 20 18   Height:      Weight:      SpO2: 96% 98% 95% 98%    Wt Readings from Last 3 Encounters:  06/03/16 70.716 kg (155 lb 14.4 oz)  09/05/15 47.741 kg (105 lb 4 oz)  09/03/15 68.04 kg (150 lb)     Intake/Output Summary (Last 24 hours) at 06/06/16 1251 Last data filed at 06/06/16 1000  Gross per 24 hour  Intake    360 ml  Output      0 ml  Net    360 ml     Physical Exam  Awake Alert, Oriented X 3, No new F.N deficits, Normal affect [place].AT,PERRAL Supple Neck,No JVD, No cervical lymphadenopathy appriciated.  Symmetrical Chest wall movement, Good air movement bilaterally, bilateral expiratory wheezing.  RRR,No Gallops,Rubs or new Murmurs, No Parasternal Heave +ve B.Sounds, Abd Soft, No tenderness, No organomegaly appriciated, No rebound - guarding or rigidity. No Cyanosis, Clubbing or  edema, No new Rash or bruise     Data Review:    CBC  Recent Labs Lab 06/03/16 1941 06/05/16 0526  WBC 17.3* 21.3*  HGB 14.6 13.4  HCT 42.5 39.9  PLT 363 365  MCV 84.5 85.1  MCH 29.0 28.6  MCHC 34.4 33.6  RDW 13.8 14.1  LYMPHSABS 4.2*  --   MONOABS 0.9  --   EOSABS 0.5  --   BASOSABS 0.0   --     Chemistries   Recent Labs Lab 06/03/16 1941  NA 136  K 3.8  CL 110  CO2 20*  GLUCOSE 96  BUN 8  CREATININE 0.66  CALCIUM 8.9   ------------------------------------------------------------------------------------------------------------------ No results for input(s): CHOL, HDL, LDLCALC, TRIG, CHOLHDL, LDLDIRECT in the last 72 hours.  No results found for: HGBA1C ------------------------------------------------------------------------------------------------------------------ No results for input(s): TSH, T4TOTAL, T3FREE, THYROIDAB in the last 72 hours.  Invalid input(s): FREET3 ------------------------------------------------------------------------------------------------------------------ No results for input(s): VITAMINB12, FOLATE, FERRITIN, TIBC, IRON, RETICCTPCT in the last 72 hours.  Coagulation profile No results for input(s): INR, PROTIME in the last 168 hours.  No results for input(s): DDIMER in the last 72 hours.  Cardiac Enzymes No results for input(s): CKMB, TROPONINI, MYOGLOBIN in the last 168 hours.  Invalid input(s): CK ------------------------------------------------------------------------------------------------------------------ No results found for: BNP  Micro Results Recent Results (from the past 240 hour(s))  Culture, blood (routine x 2)     Status: None (Preliminary result)   Collection Time: 06/03/16  8:39 PM  Result Value Ref Range Status   Specimen Description BLOOD LEFT HAND  Final   Special Requests BOTTLES DRAWN AEROBIC AND ANAEROBIC 5CC  Final   Culture   Final    NO GROWTH 2 DAYS Performed at [facility]    Report Status PENDING  Incomplete  Culture, blood (routine x 2)     Status: None (Preliminary result)   Collection Time: 06/03/16  8:51 PM  Result Value Ref Range Status   Specimen Description BLOOD RIGHT HAND  Final   Special Requests BOTTLES DRAWN AEROBIC AND ANAEROBIC 5CC  Final   Culture   Final    NO GROWTH  2 DAYS Performed at [facility]    Report Status PENDING  Incomplete  Urine culture     Status: Abnormal   Collection Time: 06/04/16  8:01 AM  Result Value Ref Range Status   Specimen Description URINE, CLEAN CATCH  Final   Special Requests NONE  Final   Culture (A)  Final    5,000 COLONIES/mL INSIGNIFICANT GROWTH Performed at [facility]    Report Status 06/05/2016 FINAL  Final    Radiology Reports Dg Chest 2 View  06/05/2016  CLINICAL DATA:  Cough, on antibiotics for possible pneumonia EXAM: CHEST  2 VIEW COMPARISON:  CT chest dated 06/04/2016 FINDINGS: Platelike atelectasis in the right upper lobe. No focal consolidation. No pleural effusion or pneumothorax. The heart is normal in size. Visualized osseous structures are within normal limits. IMPRESSION: No evidence of acute cardiopulmonary disease. Electronically Signed   By: Julian Hy M.D.   On: 06/05/2016 08:35   Dg Chest 2 View  06/04/2016  CLINICAL DATA:  Cough congestion shortness of breath with intermittent chest pain EXAM: CHEST  2 VIEW COMPARISON:  06/03/2016 FINDINGS: The heart size and vascular pattern are normal. The left lung remains clear. There is significantly improved aeration in the right upper lobe with some residual subsegmental atelectasis toward the inferior aspect of the right upper lobe.  There are no pleural effusions. Hilar and mediastinal contours appear within normal limits. IMPRESSION: Improved aeration with residual subsegmental right upper lobe atelectasis. No definite evidence of central obstructing lesion, but follow-up radiograph in 1-2 weeks is recommended to insure restoration of normal aeration. Electronically Signed   By: Skipper Cliche M.D.   On: 06/04/2016 08:47   Dg Chest 2 View  06/03/2016  CLINICAL DATA:  Chest congestion and cough for 1 week. Shortness of breath today. Smoker. EXAM: CHEST  2 VIEW COMPARISON:  12/20/2013. FINDINGS: Interval significant right upper lobe  atelectasis with superior collapse. The remainder the lungs are clear. Normal sized heart. Unremarkable bones. IMPRESSION: Interval significant right upper lobe atelectasis with partial collapse. This could be due to a centrally obstructing mass, mucous plug or non radiopaque endobronchial foreign body. Electronically Signed   By: Claudie Revering M.D.   On: 06/03/2016 19:22   Ct Chest W Contrast  06/04/2016  CLINICAL DATA:  Shortness of breath and cough EXAM: CT CHEST WITH CONTRAST TECHNIQUE: Multidetector CT imaging of the chest was performed during intravenous contrast administration. CONTRAST:  10mL ISOVUE-300 IOPAMIDOL (ISOVUE-300) INJECTION 61% COMPARISON:  Chest radiograph June 04, 2016 FINDINGS: Mediastinum/Lymph Nodes: Visualized thyroid appears normal. There is no demonstrable thoracic adenopathy. Residual thymic tissue may be normal for age. No major vessel pulmonary embolus is evident. There is no thoracic aortic aneurysm or dissection. The visualized great vessels appear normal. Pericardium is not thickened. Lungs/Pleura: There is right upper lobe atelectatic change. There is also atelectatic change in the right middle lobe. Left lung is clear. There is no edema or frank consolidation. Upper abdomen: Visualized upper abdominal structures appear unremarkable. Musculoskeletal:  There are no blastic or lytic bone lesions. IMPRESSION: Areas of patchy atelectasis in the right upper and right middle lobe regions. Lungs elsewhere clear. No adenopathy. No pneumothorax or pneumomediastinum. No vascular lesions evident. Mild apparent thymic tissue may be normal for age. Electronically Signed   By: Lowella Grip III M.D.   On: 06/04/2016 08:12    Time Spent in minutes  30  Signature  Veanna Dower A M.D on 06/06/2016 at 12:51 PM [phone] Between 7am to 7pm - Pager - [phone], After 7pm go to [email or web address] - password [email or web address]

## 2016-06-07 DIAGNOSIS — Z720 Tobacco use: Secondary | ICD-10-CM

## 2016-06-07 LAB — CBC
HCT: 41.2 % (ref 36.0–46.0)
Hemoglobin: 13.6 g/dL (ref 12.0–15.0)
MCH: 28.8 pg (ref 26.0–34.0)
MCHC: 33 g/dL (ref 30.0–36.0)
MCV: 87.1 fL (ref 78.0–100.0)
PLATELETS: 401 10*3/uL — AB (ref 150–400)
RBC: 4.73 MIL/uL (ref 3.87–5.11)
RDW: 14.1 % (ref 11.5–15.5)
WBC: 19.4 10*3/uL — AB (ref 4.0–10.5)

## 2016-06-07 LAB — BASIC METABOLIC PANEL
ANION GAP: 7 (ref 5–15)
BUN: 17 mg/dL (ref 6–20)
CALCIUM: 8.7 mg/dL — AB (ref 8.9–10.3)
CO2: 25 mmol/L (ref 22–32)
Chloride: 105 mmol/L (ref 101–111)
Creatinine, Ser: 0.58 mg/dL (ref 0.44–1.00)
GFR calc Af Amer: >60 mL/min (ref >60)
Glucose, Bld: 126 mg/dL — ABNORMAL HIGH (ref 65–99)
POTASSIUM: 4.7 mmol/L (ref 3.5–5.1)
SODIUM: 137 mmol/L (ref 135–145)

## 2016-06-07 MED ORDER — MAGNESIUM SULFATE 2 GM/50ML IV SOLN
2.0000 g | Freq: Once | INTRAVENOUS | Status: AC
  Administered 2016-06-07: 2 g via INTRAVENOUS
  Filled 2016-06-07: qty 50

## 2016-06-07 NOTE — Progress Notes (Signed)
PROGRESS NOTE                                                                                                                                                                                                             Patient Demographics:    Peggy [name], is a 25 y.o. female, DOB - 10-15-1991, [id number]  Admit date - 06/03/2016   Admitting Physician Lily Kocher, MD  Outpatient Primary MD for the patient is Pcp Not In System  LOS - 3  Chief Complaint  Patient presents with  . Asthma       Brief Narrative Peggy [name] is a 25 y.o. woman with a History of asthma (typically well controlled, only uses a rescue inhaler prn, has never been intubated or even hospitalized until now) who reports URI symptoms of nasal congestion, post nasal drip, and dry cough approximately one week ago. No fever, chills, or sweats. She felt improved until this morning when she developed increased wheezing, refractory to increased use of her albuterol rescue inhaler ("I used it 10 or 15 times"). She attempted to use her daughter's nebulizer machine, but it was malfunctioning. She presented to the ED for further evaluation and treatment.  ED Course: Bilateral inspiratory and expiratory wheezing improved but not resolved after duonebs x 2 and IV solumedrol. Chest xray concerning for RUL atelectasis. No history of aspiration. This finding was discussed with pulmonary attending (Dr. Emmit Alexanders) per the ED attending. Hospitalization for further evaluation and treatment recommended. Because she also has a leukocytosis, we will go ahead and treat empirically with Rocephin and azithromycin.   Subjective:    Peggy [name] still with SOB and wheezing, not at baseline.    Assessment  & Plan :     1.Asthma exacerbation and possible early CAP - . Placed on IV solumedrol 60mg  q6h, Duonebs q4h, , nebulizer treatments, Incentive  spirometer,  Will continue with current treatment. Continue empiric antibiotics for possible early pneumonia.  Follow WBC>  Still with BL wheezing and not at baseline. Will increase IV  IV steroids 80 to Q 6 hours.   Add guaifenesin and dextromethorphan/  Would avoid mucomyst to avoid bronchospasm.  Trial of iv mag  2.Smoking tobacco counseled to quit smoking  3. Atelectasis Chest x-ray showed right upper lobe atelectasis with partial collapse. CT chest w contrast was preformed,  showing patchy atelectasis right upper and middle lobes. CBC showed leukocytosis. Placed on chest percussion, incentive spirometer, and mucolytics. Gave chest vibrator treatment and added flutter valve therapy. Atelectasis is improving,  Chest x ray clear 6-07  4-otitis; start cipro-dexa drop/   Code Status :  Full  Family Communication  : patient.   Disposition Plan  : need to taper steroids and home in 1-2 days if lung exam without significant wheezing.   Consults  : none  Procedures  :    CT Chest w Contrast:  improving atelectasis and collapse  Pa/Lateral Chest x-ray: Right upper lobe collapse     DVT Prophylaxis  : lovenox  Lab Results  Component Value Date   PLT 401* 06/07/2016    Inpatient Medications  Scheduled Meds: . antiseptic oral rinse  7 mL Mouth Rinse q12n4p  . azithromycin  500 mg Oral Q24H  . cefTRIAXone (ROCEPHIN)  IV  1 g Intravenous Q24H  . chlorhexidine  15 mL Mouth Rinse BID  . ciprofloxacin-dexamethasone  4 drop Both Ears BID  . dextromethorphan-guaiFENesin  1 tablet Oral BID  . enoxaparin (LOVENOX) injection  40 mg Subcutaneous Q24H  . ipratropium  0.5 mg Nebulization TID  . levalbuterol  0.63 mg Nebulization TID  . methylPREDNISolone (SOLU-MEDROL) injection  80 mg Intravenous Q6H   Continuous Infusions:   PRN Meds:.acetaminophen, albuterol, alum & mag hydroxide-simeth, ondansetron (ZOFRAN) IV, traMADol, zolpidem  Antibiotics  :    Anti-infectives    Start      Dose/Rate Route Frequency Ordered Stop   06/04/16 2200  azithromycin (ZITHROMAX) tablet 500 mg     500 mg Oral Every 24 hours 06/03/16 2236 06/11/16 2159   06/04/16 2000  cefTRIAXone (ROCEPHIN) 1 g in dextrose 5 % 50 mL IVPB     1 g 100 mL/hr over 30 Minutes Intravenous Every 24 hours 06/03/16 2236 06/11/16 1959   06/03/16 2000  cefTRIAXone (ROCEPHIN) 1 g in dextrose 5 % 50 mL IVPB     1 g 100 mL/hr over 30 Minutes Intravenous  Once 06/03/16 1959 06/03/16 2117   06/03/16 2000  azithromycin (ZITHROMAX) 500 mg in dextrose 5 % 250 mL IVPB     500 mg 250 mL/hr over 60 Minutes Intravenous  Once 06/03/16 1959 06/03/16 2241         Objective:   Filed Vitals:   06/07/16 0540 06/07/16 0751 06/07/16 1430 06/07/16 1446  BP: 123/67  137/104   Pulse: 88  98   Temp: 98.3 F (36.8 C)  98.1 F (36.7 C)   TempSrc: Oral  Oral   Resp: 15  18   Height:      Weight:      SpO2: 96% 95% 95% 95%    Wt Readings from Last 3 Encounters:  06/03/16 70.716 kg (155 lb 14.4 oz)  09/05/15 47.741 kg (105 lb 4 oz)  09/03/15 68.04 kg (150 lb)    No intake or output data in the 24 hours ending 06/07/16 1536   Physical Exam  Awake Alert, Oriented X 3, No new F.N deficits, Normal affect Peggy [name].AT,PERRAL Supple Neck,No JVD, No cervical lymphadenopathy appriciated.  Symmetrical Chest wall movement, Good air movement bilaterally, diffuse bilateral expiratory wheezing.  RRR,No Gallops,Rubs or new Murmurs, No Parasternal Heave +ve B.Sounds, Abd Soft, No tenderness, No organomegaly appriciated, No rebound - guarding or rigidity. No Cyanosis, Clubbing or edema, No new Rash or bruise     Data Review:    CBC  Recent Labs  Lab 06/03/16 1941 06/05/16 0526 06/07/16 0448  WBC 17.3* 21.3* 19.4*  HGB 14.6 13.4 13.6  HCT 42.5 39.9 41.2  PLT 363 365 401*  MCV 84.5 85.1 87.1  MCH 29.0 28.6 28.8  MCHC 34.4 33.6 33.0  RDW 13.8 14.1 14.1  LYMPHSABS 4.2*  --   --   MONOABS 0.9  --   --   EOSABS 0.5  --   --    BASOSABS 0.0  --   --     Chemistries   Recent Labs Lab 06/03/16 1941 06/07/16 0448  NA 136 137  K 3.8 4.7  CL 110 105  CO2 20* 25  GLUCOSE 96 126*  BUN 8 17  CREATININE 0.66 0.58  CALCIUM 8.9 8.7*   ------------------------------------------------------------------------------------------------------------------ No results for input(s): CHOL, HDL, LDLCALC, TRIG, CHOLHDL, LDLDIRECT in the last 72 hours.  No results found for: HGBA1C ------------------------------------------------------------------------------------------------------------------ No results for input(s): TSH, T4TOTAL, T3FREE, THYROIDAB in the last 72 hours.  Invalid input(s): FREET3 ------------------------------------------------------------------------------------------------------------------ No results for input(s): VITAMINB12, FOLATE, FERRITIN, TIBC, IRON, RETICCTPCT in the last 72 hours.  Coagulation profile No results for input(s): INR, PROTIME in the last 168 hours.  No results for input(s): DDIMER in the last 72 hours.  Cardiac Enzymes No results for input(s): CKMB, TROPONINI, MYOGLOBIN in the last 168 hours.  Invalid input(s): CK ------------------------------------------------------------------------------------------------------------------ No results found for: BNP  Micro Results Recent Results (from the past 240 hour(s))  Culture, blood (routine x 2)     Status: None (Preliminary result)   Collection Time: 06/03/16  8:39 PM  Result Value Ref Range Status   Specimen Description BLOOD LEFT HAND  Final   Special Requests BOTTLES DRAWN AEROBIC AND ANAEROBIC 5CC  Final   Culture   Final    NO GROWTH 4 DAYS Performed at [facility]    Report Status PENDING  Incomplete  Culture, blood (routine x 2)     Status: None (Preliminary result)   Collection Time: 06/03/16  8:51 PM  Result Value Ref Range Status   Specimen Description BLOOD RIGHT HAND  Final   Special Requests BOTTLES  DRAWN AEROBIC AND ANAEROBIC 5CC  Final   Culture   Final    NO GROWTH 4 DAYS Performed at [facility]    Report Status PENDING  Incomplete  Urine culture     Status: Abnormal   Collection Time: 06/04/16  8:01 AM  Result Value Ref Range Status   Specimen Description URINE, CLEAN CATCH  Final   Special Requests NONE  Final   Culture (A)  Final    5,000 COLONIES/mL INSIGNIFICANT GROWTH Performed at [facility]    Report Status 06/05/2016 FINAL  Final    Radiology Reports Dg Chest 2 View  06/05/2016  CLINICAL DATA:  Cough, on antibiotics for possible pneumonia EXAM: CHEST  2 VIEW COMPARISON:  CT chest dated 06/04/2016 FINDINGS: Platelike atelectasis in the right upper lobe. No focal consolidation. No pleural effusion or pneumothorax. The heart is normal in size. Visualized osseous structures are within normal limits. IMPRESSION: No evidence of acute cardiopulmonary disease. Electronically Signed   By: Julian Hy M.D.   On: 06/05/2016 08:35   Dg Chest 2 View  06/04/2016  CLINICAL DATA:  Cough congestion shortness of breath with intermittent chest pain EXAM: CHEST  2 VIEW COMPARISON:  06/03/2016 FINDINGS: The heart size and vascular pattern are normal. The left lung remains clear. There is significantly improved aeration in the right upper lobe with  some residual subsegmental atelectasis toward the inferior aspect of the right upper lobe. There are no pleural effusions. Hilar and mediastinal contours appear within normal limits. IMPRESSION: Improved aeration with residual subsegmental right upper lobe atelectasis. No definite evidence of central obstructing lesion, but follow-up radiograph in 1-2 weeks is recommended to insure restoration of normal aeration. Electronically Signed   By: Skipper Cliche M.D.   On: 06/04/2016 08:47   Dg Chest 2 View  06/03/2016  CLINICAL DATA:  Chest congestion and cough for 1 week. Shortness of breath today. Smoker. EXAM: CHEST  2 VIEW  COMPARISON:  12/20/2013. FINDINGS: Interval significant right upper lobe atelectasis with superior collapse. The remainder the lungs are clear. Normal sized heart. Unremarkable bones. IMPRESSION: Interval significant right upper lobe atelectasis with partial collapse. This could be due to a centrally obstructing mass, mucous plug or non radiopaque endobronchial foreign body. Electronically Signed   By: Claudie Revering M.D.   On: 06/03/2016 19:22   Ct Chest W Contrast  06/04/2016  CLINICAL DATA:  Shortness of breath and cough EXAM: CT CHEST WITH CONTRAST TECHNIQUE: Multidetector CT imaging of the chest was performed during intravenous contrast administration. CONTRAST:  72mL ISOVUE-300 IOPAMIDOL (ISOVUE-300) INJECTION 61% COMPARISON:  Chest radiograph June 04, 2016 FINDINGS: Mediastinum/Lymph Nodes: Visualized thyroid appears normal. There is no demonstrable thoracic adenopathy. Residual thymic tissue may be normal for age. No major vessel pulmonary embolus is evident. There is no thoracic aortic aneurysm or dissection. The visualized great vessels appear normal. Pericardium is not thickened. Lungs/Pleura: There is right upper lobe atelectatic change. There is also atelectatic change in the right middle lobe. Left lung is clear. There is no edema or frank consolidation. Upper abdomen: Visualized upper abdominal structures appear unremarkable. Musculoskeletal:  There are no blastic or lytic bone lesions. IMPRESSION: Areas of patchy atelectasis in the right upper and right middle lobe regions. Lungs elsewhere clear. No adenopathy. No pneumothorax or pneumomediastinum. No vascular lesions evident. Mild apparent thymic tissue may be normal for age. Electronically Signed   By: Lowella Grip III M.D.   On: 06/04/2016 08:12    Time Spent in minutes  30  Signature  Anie Juniel M.D PhD on 06/07/2016 at 3:36 PM [phone]  After 7pm go to [email or web address] - password [facility]

## 2016-06-07 NOTE — Progress Notes (Signed)
Pt c/o episode where she felt like her heart was racing, her vision was blurry, and her ears were ringing. These symptoms resolved on their own. Upon assessment of heart rate, it was 128 on telemetry while patient was resting in bed, but it decreased quickly to 115 which has been pt's baseline since admission. The pt reported that the blurry vision resolved too. Ear drops already ordered for stuffy ears, they were placed in ears at this time. Pt told to make RN aware if she has any further episodes. Will continue to monitor.   Othella Boyer [facility] 06/07/2016

## 2016-06-08 LAB — CULTURE, BLOOD (ROUTINE X 2)
CULTURE: NO GROWTH
Culture: NO GROWTH

## 2016-06-08 MED ORDER — ACETAMINOPHEN 325 MG PO TABS: 650.0000 mg | ORAL_TABLET | Freq: Four times a day (QID) | ORAL | Status: DC | PRN

## 2016-06-08 MED ORDER — ALBUTEROL SULFATE (2.5 MG/3ML) 0.083% IN NEBU: 2.5000 mg | INHALATION_SOLUTION | Freq: Four times a day (QID) | RESPIRATORY_TRACT | Status: DC

## 2016-06-08 MED ORDER — IPRATROPIUM BROMIDE 0.02 % IN SOLN: 0.5000 mg | RESPIRATORY_TRACT | Status: DC

## 2016-06-08 MED ORDER — PREDNISONE 20 MG PO TABS: ORAL_TABLET | ORAL | Status: DC

## 2016-06-08 MED ORDER — ALBUTEROL SULFATE HFA 108 (90 BASE) MCG/ACT IN AERS
1.0000 | INHALATION_SPRAY | Freq: Four times a day (QID) | RESPIRATORY_TRACT | Status: DC
  Filled 2016-06-08: qty 6.7

## 2016-06-08 MED ORDER — DM-GUAIFENESIN ER 30-600 MG PO TB12: 1.0000 | ORAL_TABLET | Freq: Two times a day (BID) | ORAL | Status: DC

## 2016-06-08 MED ORDER — IPRATROPIUM BROMIDE 0.02 % IN SOLN: 0.5000 mg | Freq: Four times a day (QID) | RESPIRATORY_TRACT | Status: DC

## 2016-06-08 MED ORDER — AZITHROMYCIN 500 MG PO TABS: 500.0000 mg | ORAL_TABLET | Freq: Every day | ORAL | Status: DC

## 2016-06-08 MED ORDER — IPRATROPIUM BROMIDE HFA 17 MCG/ACT IN AERS
2.0000 | INHALATION_SPRAY | Freq: Four times a day (QID) | RESPIRATORY_TRACT | Status: DC
  Filled 2016-06-08: qty 12.9

## 2016-06-08 MED ORDER — IPRATROPIUM BROMIDE HFA 17 MCG/ACT IN AERS: 2.0000 | INHALATION_SPRAY | Freq: Four times a day (QID) | RESPIRATORY_TRACT | Status: DC

## 2016-06-08 NOTE — Discharge Summary (Signed)
Physician Discharge Summary  Peggy [name] [id number] DOB: [birth year] DOA: 06/03/2016  PCP: Pcp Not In System  Admit date: 06/03/2016 Discharge date: 06/08/2016  Admitted From:Home Disposition: Discharge Home.   Recommendations for Outpatient Follow-up:  1. Follow up with PCP in 1-2 weeks 2. Please obtain BMP/CBC in one week   [place]; no Equipment/Devices:none  Discharge Condition:Stable.  CODE STATUS:Full Code.  Diet recommendation:Regular   Brief/Interim Summary: 1.Asthma exacerbation and possible early CAP - . Placed on IV solumedrol 80mg  q6h, Duonebs q4h, , nebulizer treatments, Incentive spirometer.   Continue empiric antibiotics for possible early pneumonia.  Follow WBC> trending down, likely related to steroids.  Added  guaifenesin and dextromethorphan/  Would avoid mucomyst to avoid bronchospasm.  Improved, discharge on prednisone. Advised to stop smoking,.   2.Smoking tobacco counseled to quit smoking  3. Atelectasis Chest x-ray showed right upper lobe atelectasis with partial collapse. CT chest w contrast was preformed, showing patchy atelectasis right upper and middle lobes. CBC showed leukocytosis. Placed on chest percussion, incentive spirometer, and mucolytics. Gave chest vibrator treatment and added flutter valve therapy. Atelectasis is improving,  Chest x ray clear 6-07  4-otitis; start cipro-dexa drop/   Discharge Diagnoses:    Asthma exacerbation   Smoker   Atelectasis   Leukocytosis    Discharge Instructions  Discharge Instructions    Diet - low sodium heart healthy    Complete by:  As directed      Increase activity slowly    Complete by:  As directed             Medication List    STOP taking these medications        dicyclomine 20 MG tablet  Commonly known as:  BENTYL     famotidine 20 MG tablet  Commonly known as:  PEPCID     omeprazole 20 MG capsule  Commonly known as:  PRILOSEC      TAKE these  medications        acetaminophen 325 MG tablet  Commonly known as:  TYLENOL  Take 2 tablets (650 mg total) by mouth every 6 (six) hours as needed for mild pain, fever or headache.     albuterol 108 (90 Base) MCG/ACT inhaler  Commonly known as:  PROVENTIL HFA;VENTOLIN HFA  Inhale 2 puffs into the lungs every 6 (six) hours as needed for wheezing or shortness of breath.     azithromycin 500 MG tablet  Commonly known as:  ZITHROMAX  Take 1 tablet (500 mg total) by mouth daily.     dextromethorphan-guaiFENesin 30-600 MG 12hr tablet  Commonly known as:  MUCINEX DM  Take 1 tablet by mouth 2 (two) times daily.     ipratropium 17 MCG/ACT inhaler  Commonly known as:  ATROVENT HFA  Inhale 2 puffs into the lungs every 6 (six) hours.     predniSONE 20 MG tablet  Commonly known as:  DELTASONE  Take 3 tablets for 3 days then 2 tablets for 4 days.           Follow-up Information    Follow up with [facility] On 07/09/2016.   Why:  PCP appt;@ 9:30a/bring meds in bottle/d/c papers/photo id   Contact information:   [place] [phone]       Please follow up.   Why:  Pharmacy-go there for meds   Contact information:   Pharmacy @ [facility] for meds @ d/c. [address]  [place] [address] [phone]     No Known Allergies  Consultations: none  Procedures/Studies: Dg Chest 2 View  06/05/2016  CLINICAL DATA:  Cough, on antibiotics for possible pneumonia EXAM: CHEST  2 VIEW COMPARISON:  CT chest dated 06/04/2016 FINDINGS: Platelike atelectasis in the right upper lobe. No focal consolidation. No pleural effusion or pneumothorax. The heart is normal in size. Visualized osseous structures are within normal limits. IMPRESSION: No evidence of acute cardiopulmonary disease. Electronically Signed   By: Julian Hy M.D.   On: 06/05/2016 08:35   Dg Chest 2 View  06/04/2016  CLINICAL DATA:  Cough congestion shortness of breath with intermittent  chest pain EXAM: CHEST  2 VIEW COMPARISON:  06/03/2016 FINDINGS: The heart size and vascular pattern are normal. The left lung remains clear. There is significantly improved aeration in the right upper lobe with some residual subsegmental atelectasis toward the inferior aspect of the right upper lobe. There are no pleural effusions. Hilar and mediastinal contours appear within normal limits. IMPRESSION: Improved aeration with residual subsegmental right upper lobe atelectasis. No definite evidence of central obstructing lesion, but follow-up radiograph in 1-2 weeks is recommended to insure restoration of normal aeration. Electronically Signed   By: Skipper Cliche M.D.   On: 06/04/2016 08:47   Dg Chest 2 View  06/03/2016  CLINICAL DATA:  Chest congestion and cough for 1 week. Shortness of breath today. Smoker. EXAM: CHEST  2 VIEW COMPARISON:  12/20/2013. FINDINGS: Interval significant right upper lobe atelectasis with superior collapse. The remainder the lungs are clear. Normal sized heart. Unremarkable bones. IMPRESSION: Interval significant right upper lobe atelectasis with partial collapse. This could be due to a centrally obstructing mass, mucous plug or non radiopaque endobronchial foreign body. Electronically Signed   By: Claudie Revering M.D.   On: 06/03/2016 19:22   Ct Chest W Contrast  06/04/2016  CLINICAL DATA:  Shortness of breath and cough EXAM: CT CHEST WITH CONTRAST TECHNIQUE: Multidetector CT imaging of the chest was performed during intravenous contrast administration. CONTRAST:  52mL ISOVUE-300 IOPAMIDOL (ISOVUE-300) INJECTION 61% COMPARISON:  Chest radiograph June 04, 2016 FINDINGS: Mediastinum/Lymph Nodes: Visualized thyroid appears normal. There is no demonstrable thoracic adenopathy. Residual thymic tissue may be normal for age. No major vessel pulmonary embolus is evident. There is no thoracic aortic aneurysm or dissection. The visualized great vessels appear normal. Pericardium is not  thickened. Lungs/Pleura: There is right upper lobe atelectatic change. There is also atelectatic change in the right middle lobe. Left lung is clear. There is no edema or frank consolidation. Upper abdomen: Visualized upper abdominal structures appear unremarkable. Musculoskeletal:  There are no blastic or lytic bone lesions. IMPRESSION: Areas of patchy atelectasis in the right upper and right middle lobe regions. Lungs elsewhere clear. No adenopathy. No pneumothorax or pneumomediastinum. No vascular lesions evident. Mild apparent thymic tissue may be normal for age. Electronically Signed   By: Lowella Grip III M.D.   On: 06/04/2016 08:12       Subjective: She is feeling better, breathing better, at baseline.   Discharge Exam: Filed Vitals:   06/07/16 2142 06/08/16 0644  BP: 145/98 118/71  Pulse: 96 87  Temp: 98.4 F (36.9 C) 98.3 F (36.8 C)  Resp: 18 18   Filed Vitals:   06/07/16 1430 06/07/16 1446 06/07/16 2142 06/08/16 0644  BP: 137/104  145/98 118/71  Pulse: 98  96 87  Temp: 98.1 F (36.7 C)  98.4 F (36.9 C) 98.3 F (36.8 C)  TempSrc: Oral  Oral Oral  Resp: 18  18 18   Height:      Weight:      SpO2: 95% 95% 95% 97%    General: Pt is alert, awake, not in acute distress Cardiovascular: RRR, S1/S2 +, no rubs, no gallops Respiratory: CTA bilaterally, sporadic  wheezing, no rhonchi Abdominal: Soft, NT, ND, bowel sounds + Extremities: no edema, no cyanosis    The results of significant diagnostics from this hospitalization (including imaging, microbiology, ancillary and laboratory) are listed below for reference.     Microbiology: Recent Results (from the past 240 hour(s))  Culture, blood (routine x 2)     Status: None (Preliminary result)   Collection Time: 06/03/16  8:39 PM  Result Value Ref Range Status   Specimen Description BLOOD LEFT HAND  Final   Special Requests BOTTLES DRAWN AEROBIC AND ANAEROBIC 5CC  Final   Culture   Final    NO GROWTH 4  DAYS Performed at [facility]    Report Status PENDING  Incomplete  Culture, blood (routine x 2)     Status: None (Preliminary result)   Collection Time: 06/03/16  8:51 PM  Result Value Ref Range Status   Specimen Description BLOOD RIGHT HAND  Final   Special Requests BOTTLES DRAWN AEROBIC AND ANAEROBIC 5CC  Final   Culture   Final    NO GROWTH 4 DAYS Performed at [facility]    Report Status PENDING  Incomplete  Urine culture     Status: Abnormal   Collection Time: 06/04/16  8:01 AM  Result Value Ref Range Status   Specimen Description URINE, CLEAN CATCH  Final   Special Requests NONE  Final   Culture (A)  Final    5,000 COLONIES/mL INSIGNIFICANT GROWTH Performed at [facility]    Report Status 06/05/2016 FINAL  Final     Labs: BNP (last 3 results) No results for input(s): BNP in the last 8760 hours. Basic Metabolic Panel:  Recent Labs Lab 06/03/16 1941 06/07/16 0448  NA 136 137  K 3.8 4.7  CL 110 105  CO2 20* 25  GLUCOSE 96 126*  BUN 8 17  CREATININE 0.66 0.58  CALCIUM 8.9 8.7*   Liver Function Tests: No results for input(s): AST, ALT, ALKPHOS, BILITOT, PROT, ALBUMIN in the last 168 hours. No results for input(s): LIPASE, AMYLASE in the last 168 hours. No results for input(s): AMMONIA in the last 168 hours. CBC:  Recent Labs Lab 06/03/16 1941 06/05/16 0526 06/07/16 0448  WBC 17.3* 21.3* 19.4*  NEUTROABS 11.6*  --   --   HGB 14.6 13.4 13.6  HCT 42.5 39.9 41.2  MCV 84.5 85.1 87.1  PLT 363 365 401*   Cardiac Enzymes: No results for input(s): CKTOTAL, CKMB, CKMBINDEX, TROPONINI in the last 168 hours. BNP: Invalid input(s): POCBNP CBG: No results for input(s): GLUCAP in the last 168 hours. D-Dimer No results for input(s): DDIMER in the last 72 hours. Hgb A1c No results for input(s): HGBA1C in the last 72 hours. Lipid Profile No results for input(s): CHOL, HDL, LDLCALC, TRIG, CHOLHDL, LDLDIRECT in the last 72 hours. Thyroid  function studies No results for input(s): TSH, T4TOTAL, T3FREE, THYROIDAB in the last 72 hours.  Invalid input(s): FREET3 Anemia work up No results for input(s): VITAMINB12, FOLATE, FERRITIN, TIBC, IRON, RETICCTPCT in the last 72 hours. Urinalysis    Component Value Date/Time   COLORURINE YELLOW 06/04/2016 0801   APPEARANCEUR CLEAR 06/04/2016 0801  LABSPEC 1.006 06/04/2016 0801   PHURINE 6.0 06/04/2016 0801   GLUCOSEU NEGATIVE 06/04/2016 0801   HGBUR NEGATIVE 06/04/2016 0801   BILIRUBINUR NEGATIVE 06/04/2016 0801   KETONESUR NEGATIVE 06/04/2016 0801   PROTEINUR NEGATIVE 06/04/2016 0801   UROBILINOGEN 0.2 10/24/2015 1605   NITRITE NEGATIVE 06/04/2016 0801   LEUKOCYTESUR NEGATIVE 06/04/2016 0801   Sepsis Labs Invalid input(s): PROCALCITONIN,  WBC,  LACTICIDVEN Microbiology Recent Results (from the past 240 hour(s))  Culture, blood (routine x 2)     Status: None (Preliminary result)   Collection Time: 06/03/16  8:39 PM  Result Value Ref Range Status   Specimen Description BLOOD LEFT HAND  Final   Special Requests BOTTLES DRAWN AEROBIC AND ANAEROBIC 5CC  Final   Culture   Final    NO GROWTH 4 DAYS Performed at [facility]    Report Status PENDING  Incomplete  Culture, blood (routine x 2)     Status: None (Preliminary result)   Collection Time: 06/03/16  8:51 PM  Result Value Ref Range Status   Specimen Description BLOOD RIGHT HAND  Final   Special Requests BOTTLES DRAWN AEROBIC AND ANAEROBIC 5CC  Final   Culture   Final    NO GROWTH 4 DAYS Performed at [facility]    Report Status PENDING  Incomplete  Urine culture     Status: Abnormal   Collection Time: 06/04/16  8:01 AM  Result Value Ref Range Status   Specimen Description URINE, CLEAN CATCH  Final   Special Requests NONE  Final   Culture (A)  Final    5,000 COLONIES/mL INSIGNIFICANT GROWTH Performed at [facility]    Report Status 06/05/2016 FINAL  Final     Time coordinating  discharge: Over 30 minutes  SIGNED:   Elmarie Shiley, MD  [facility] 06/08/2016, 8:21 AM Pager [phone]  If 7PM-7AM, please contact night-coverage [email or web address] Password [email or web address]

## 2016-06-08 NOTE — Progress Notes (Signed)
Reviewed discharge information with patient and caregiver. Answered all questions. Patient/caregiver able to teach back medications and reasons to contact MD/911. Patient discharged with inhalers (atrovent and albuterol, demonstrates correct use). Patient verbalizes importance of PCP follow up appointment.  Peggy [name]. Brigitte Pulse, RN

## 2016-07-09 ENCOUNTER — Ambulatory Visit: Payer: Medicaid Other | Admitting: Family Medicine

## 2016-10-08 ENCOUNTER — Encounter (HOSPITAL_COMMUNITY): Payer: Self-pay

## 2016-10-08 ENCOUNTER — Emergency Department (HOSPITAL_COMMUNITY)
Admission: EM | Admit: 2016-10-08 | Discharge: 2016-10-09 | Disposition: A | Discharge status: Home / Self Care | Payer: Medicaid Other | Attending: Emergency Medicine | Admitting: Emergency Medicine

## 2016-10-08 DIAGNOSIS — K2970 Gastritis, unspecified, without bleeding: Secondary | ICD-10-CM | POA: Insufficient documentation

## 2016-10-08 DIAGNOSIS — F17210 Nicotine dependence, cigarettes, uncomplicated: Secondary | ICD-10-CM | POA: Insufficient documentation

## 2016-10-08 DIAGNOSIS — Z7951 Long term (current) use of inhaled steroids: Secondary | ICD-10-CM | POA: Insufficient documentation

## 2016-10-08 DIAGNOSIS — J45909 Unspecified asthma, uncomplicated: Secondary | ICD-10-CM | POA: Insufficient documentation

## 2016-10-08 LAB — CBC
HEMATOCRIT: 42.2 % (ref 36.0–46.0)
HEMOGLOBIN: 13.9 g/dL (ref 12.0–15.0)
MCH: 28.6 pg (ref 26.0–34.0)
MCHC: 32.9 g/dL (ref 30.0–36.0)
MCV: 86.8 fL (ref 78.0–100.0)
Platelets: 388 10*3/uL (ref 150–400)
RBC: 4.86 MIL/uL (ref 3.87–5.11)
RDW: 12.9 % (ref 11.5–15.5)
WBC: 12 10*3/uL — AB (ref 4.0–10.5)

## 2016-10-08 LAB — COMPREHENSIVE METABOLIC PANEL
ALT: 25 U/L (ref 14–54)
ANION GAP: 9 (ref 5–15)
AST: 22 U/L (ref 15–41)
Albumin: 4.2 g/dL (ref 3.5–5.0)
Alkaline Phosphatase: 59 U/L (ref 38–126)
BUN: 10 mg/dL (ref 6–20)
CHLORIDE: 110 mmol/L (ref 101–111)
CO2: 22 mmol/L (ref 22–32)
Calcium: 9.3 mg/dL (ref 8.9–10.3)
Creatinine, Ser: 0.68 mg/dL (ref 0.44–1.00)
Glucose, Bld: 81 mg/dL (ref 65–99)
POTASSIUM: 3.6 mmol/L (ref 3.5–5.1)
Sodium: 141 mmol/L (ref 135–145)
Total Bilirubin: 0.3 mg/dL (ref 0.3–1.2)
Total Protein: 8.2 g/dL — ABNORMAL HIGH (ref 6.5–8.1)

## 2016-10-08 LAB — URINALYSIS, ROUTINE W REFLEX MICROSCOPIC
GLUCOSE, UA: NEGATIVE mg/dL
Hgb urine dipstick: NEGATIVE
KETONES UR: NEGATIVE mg/dL
LEUKOCYTES UA: NEGATIVE
Nitrite: NEGATIVE
PH: 6 (ref 5.0–8.0)
Protein, ur: NEGATIVE mg/dL
Specific Gravity, Urine: 1.031 — ABNORMAL HIGH (ref 1.005–1.030)

## 2016-10-08 LAB — POC URINE PREG, ED: Preg Test, Ur: NEGATIVE

## 2016-10-08 LAB — LIPASE, BLOOD: LIPASE: 20 U/L (ref 11–51)

## 2016-10-08 MED ORDER — PANTOPRAZOLE SODIUM 40 MG PO TBEC
80.0000 mg | DELAYED_RELEASE_TABLET | Freq: Once | ORAL | Status: AC
  Administered 2016-10-08: 80 mg via ORAL
  Filled 2016-10-08: qty 2

## 2016-10-08 MED ORDER — ONDANSETRON 4 MG PO TBDP
4.0000 mg | ORAL_TABLET | Freq: Once | ORAL | Status: AC
  Administered 2016-10-08: 4 mg via ORAL
  Filled 2016-10-08: qty 1

## 2016-10-08 MED ORDER — SUCRALFATE 1 GM/10ML PO SUSP: 1.0000 g | Freq: Three times a day (TID) | ORAL | 0 refills | Status: DC

## 2016-10-08 MED ORDER — ONDANSETRON 4 MG PO TBDP: 4.0000 mg | ORAL_TABLET | Freq: Three times a day (TID) | ORAL | 0 refills | Status: DC | PRN

## 2016-10-08 MED ORDER — GI COCKTAIL ~~LOC~~
30.0000 mL | Freq: Once | ORAL | Status: AC
  Administered 2016-10-08: 30 mL via ORAL
  Filled 2016-10-08: qty 30

## 2016-10-08 MED ORDER — PANTOPRAZOLE SODIUM 40 MG PO TBEC: 40.0000 mg | DELAYED_RELEASE_TABLET | Freq: Every day | ORAL | 1 refills | Status: DC

## 2016-10-08 NOTE — ED Provider Notes (Signed)
By signing my name below, I, Emmanuella Mensah, attest that this documentation has been prepared under the direction and in the presence of [place], DO. Electronically Signed: Judithann Sauger, ED Scribe. 10/08/16. 11:45 PM.   TIME SEEN: 11:38 PM   CHIEF COMPLAINT: Abdominal Pain  HPI: Peggy [name] is a 25 y.o. female with a hx of GERD who presents to the Emergency Department complaining of gradually worsening intermittent moderate epigastric pain she describes as sharp onset several months ago. She reports associated nausea and states that the pain is worse after eating. She explains that her pain is concentrated around her surgical incision from her hx of pyloromyotomy when she was a baby. She denies any alleviating factors. She states that she has tried Tylenol and a hot bath with no relief. She adds that she drinks milk and ginger ale everyday with mild relief. She reports that she occasionally drinks ETOH which does seem to make her pain worse. She denies any frequent NSAIDs use. She states that she has been seen a few times for these symptoms in the ED but has not followed up with a GI. She denies any fever, chills, vomiting, hematemesis, hematochezia, melena, or chest pain. States she has never followed up with a GI doctor or had an endoscopy. States she does not take a PPI regularly.  ROS: See HPI Constitutional: no fever  Eyes: no drainage  ENT: no runny nose   Cardiovascular:  no chest pain  Resp: no SOB  GI: no vomiting GU: no dysuria Integumentary: no rash  Allergy: no hives  Musculoskeletal: no leg swelling  Neurological: no slurred speech ROS otherwise negative  PAST MEDICAL HISTORY/PAST SURGICAL HISTORY:  Past Medical History:  Diagnosis Date  . Asthma   . Pyloric stenosis     MEDICATIONS:  Prior to Admission medications   Medication Sig Start Date End Date Taking? Authorizing Provider  acetaminophen (TYLENOL) 325 MG tablet Take 2 tablets (650 mg  total) by mouth every 6 (six) hours as needed for mild pain, fever or headache. 06/08/16   Belkys A Regalado, MD  albuterol (PROVENTIL HFA;VENTOLIN HFA) 108 (90 BASE) MCG/ACT inhaler Inhale 2 puffs into the lungs every 6 (six) hours as needed for wheezing or shortness of breath.    Historical Provider, MD  azithromycin (ZITHROMAX) 500 MG tablet Take 1 tablet (500 mg total) by mouth daily. 06/08/16   Belkys A Regalado, MD  dextromethorphan-guaiFENesin (MUCINEX DM) 30-600 MG 12hr tablet Take 1 tablet by mouth 2 (two) times daily. 06/08/16   Belkys A Regalado, MD  ipratropium (ATROVENT HFA) 17 MCG/ACT inhaler Inhale 2 puffs into the lungs every 6 (six) hours. 06/08/16   Belkys A Regalado, MD  predniSONE (DELTASONE) 20 MG tablet Take 3 tablets for 3 days then 2 tablets for 4 days. 06/08/16   Belkys A Regalado, MD    ALLERGIES:  No Known Allergies  SOCIAL HISTORY:  Social History  Substance Use Topics  . Smoking status: Current Every Day Smoker    Packs/day: 0.25    Years: 5.00    Types: Cigarettes  . Smokeless tobacco: Never Used  . Alcohol use No    FAMILY HISTORY: Family History  Problem Relation Age of Onset  . Arthritis Mother   . Cancer Mother     thyroid  . Depression Mother   . Hyperlipidemia Father   . Asthma Father   . Hypertension Father   . COPD Maternal Grandfather   . Diabetes Paternal Grandmother   .  Arthritis Paternal Grandmother   . Asthma Paternal Grandmother   . Heart disease Paternal Grandmother   . Depression Paternal Grandmother   . Hypertension Paternal Grandmother   . Other Neg Hx     EXAM: BP 139/87   Pulse 102   Temp 98.5 F (36.9 C)   Resp 16   Ht 5' (1.524 m)   Wt 157 lb (71.2 kg)   SpO2 99%   BMI 30.66 kg/m  CONSTITUTIONAL: Alert and oriented and responds appropriately to questions. Well-appearing; well-nourished; Pt's boyfriend laying on top of her with head on stomach and pt is laying comfortable in no distress HEAD: Normocephalic EYES:  Conjunctivae clear, PERRL ENT: normal nose; no rhinorrhea; moist mucous membranes NECK: Supple, no meningismus, no LAD  CARD: RRR; S1 and S2 appreciated; no murmurs, no clicks, no rubs, no gallops RESP: Normal chest excursion without splinting or tachypnea; breath sounds clear and equal bilaterally; no wheezes, no rhonchi, no rales, no hypoxia or respiratory distress, speaking full sentences ABD/GI: Normal bowel sounds; non-distended; soft, non-tender, no rebound, no guarding, no peritoneal signs, no tenderness at McBurney's point, negative Murphy sign BACK:  The back appears normal and is non-tender to palpation, there is no CVA tenderness EXT: Normal ROM in all joints; non-tender to palpation; no edema; normal capillary refill; no cyanosis, no calf tenderness or swelling    SKIN: Normal color for age and race; warm; no rash NEURO: Moves all extremities equally, sensation to light touch intact diffusely, cranial nerves II through XII intact PSYCH: The patient's mood and manner are appropriate. Grooming and personal hygiene are appropriate.  MEDICAL DECISION MAKING: Patient here with intermittent epigastric pain. Worse with lying flat, after eating. Suspect gastritis, possible peptic ulcer. Doubt perforated ulcer given her abdominal exam is very benign currently, no peritoneal signs. She denies any bloody stools, melena, hematemesis. She did have an ultrasound of her gallbladder in October 2016 which was normal. She has no right upper quadrant tenderness today and a negative Murphy sign. Do not feel this needs to be repeated. Labs of been unremarkable including normal LFTs, lipase. She does have a chronic leukocytosis which appears to be improved compared to prior. We'll treat her symptoms with GI cocktail, Protonix. We'll discharge with prescriptions for Carafate, Protonix and close GI follow-up. Have recommended she avoid NSAIDs, alcohol. Have given her diet recommendations. I do not feel she needs  further emergent workup at this time.  At this time, I do not feel there is any life-threatening condition present. I have reviewed and discussed all results (EKG, imaging, lab, urine as appropriate), exam findings with patient/family. I have reviewed nursing notes and appropriate previous records.  I feel the patient is safe to be discharged home without further emergent workup and can continue workup as an outpatient as needed. Discussed usual and customary return precautions. Patient/family verbalize understanding and are comfortable with this plan.  Outpatient follow-up has been provided. All questions have been answered.   I personally performed the services described in this documentation, which was scribed in my presence. The recorded information has been reviewed and is accurate.      [place], DO 10/09/16 [phone]

## 2016-10-08 NOTE — ED Triage Notes (Signed)
PT states that she has recurrent epigastric pain that happens every couple of months. States that this episode started yesterday. Pt describes the pain as a sharp pain in her epigastric region that radiates to her back. Pt mentioned a hx of surgery for pyloric stenosis when she was a baby. Pt has a hx of acid reflux and wants to emphasize that she feels that this pain is not related. Pt states that eating or laying down worsens the pain. A&Ox4. Denies diarrhea or vomiting.

## 2016-10-08 NOTE — ED Notes (Signed)
Urine sample in triage

## 2016-10-09 NOTE — ED Notes (Signed)
Patient is alert and oriented x3.  She was given DC instructions and follow up visit instructions.  Patient gave verbal understanding. She was DC ambulatory under her own power to home.  V/S stable.  He was not showing any signs of distress on DC 

## 2016-12-12 ENCOUNTER — Inpatient Hospital Stay (HOSPITAL_COMMUNITY)
Admission: AD | Admit: 2016-12-12 | Discharge: 2016-12-12 | Disposition: A | Discharge status: Home / Self Care | Payer: Medicaid Other | Source: Ambulatory Visit | Attending: Obstetrics and Gynecology | Admitting: Obstetrics and Gynecology

## 2016-12-12 ENCOUNTER — Inpatient Hospital Stay (HOSPITAL_COMMUNITY): Payer: Medicaid Other

## 2016-12-12 ENCOUNTER — Encounter (HOSPITAL_COMMUNITY): Payer: Self-pay | Admitting: *Deleted

## 2016-12-12 DIAGNOSIS — Z6790 Unspecified blood type, Rh positive: Secondary | ICD-10-CM | POA: Insufficient documentation

## 2016-12-12 DIAGNOSIS — O26851 Spotting complicating pregnancy, first trimester: Secondary | ICD-10-CM | POA: Diagnosis not present

## 2016-12-12 DIAGNOSIS — F17210 Nicotine dependence, cigarettes, uncomplicated: Secondary | ICD-10-CM | POA: Insufficient documentation

## 2016-12-12 DIAGNOSIS — Z3A00 Weeks of gestation of pregnancy not specified: Secondary | ICD-10-CM | POA: Insufficient documentation

## 2016-12-12 DIAGNOSIS — O26891 Other specified pregnancy related conditions, first trimester: Secondary | ICD-10-CM | POA: Diagnosis not present

## 2016-12-12 DIAGNOSIS — R109 Unspecified abdominal pain: Secondary | ICD-10-CM | POA: Diagnosis not present

## 2016-12-12 DIAGNOSIS — O26859 Spotting complicating pregnancy, unspecified trimester: Secondary | ICD-10-CM | POA: Diagnosis not present

## 2016-12-12 DIAGNOSIS — O26899 Other specified pregnancy related conditions, unspecified trimester: Secondary | ICD-10-CM

## 2016-12-12 DIAGNOSIS — O3680X0 Pregnancy with inconclusive fetal viability, not applicable or unspecified: Secondary | ICD-10-CM

## 2016-12-12 LAB — URINALYSIS, ROUTINE W REFLEX MICROSCOPIC
Bacteria, UA: NONE SEEN
Bilirubin Urine: NEGATIVE
Glucose, UA: NEGATIVE mg/dL
Hgb urine dipstick: NEGATIVE
Ketones, ur: 5 mg/dL — AB
Nitrite: NEGATIVE
Protein, ur: 30 mg/dL — AB
Specific Gravity, Urine: 1.028 (ref 1.005–1.030)
pH: 5 (ref 5.0–8.0)

## 2016-12-12 LAB — CBC
HCT: 39.4 % (ref 36.0–46.0)
HEMOGLOBIN: 13.2 g/dL (ref 12.0–15.0)
MCH: 28.7 pg (ref 26.0–34.0)
MCHC: 33.5 g/dL (ref 30.0–36.0)
MCV: 85.7 fL (ref 78.0–100.0)
PLATELETS: 372 10*3/uL (ref 150–400)
RBC: 4.6 MIL/uL (ref 3.87–5.11)
RDW: 14.6 % (ref 11.5–15.5)
WBC: 8.2 10*3/uL (ref 4.0–10.5)

## 2016-12-12 LAB — WET PREP, GENITAL
CLUE CELLS WET PREP: NONE SEEN
Sperm: NONE SEEN
Trich, Wet Prep: NONE SEEN
YEAST WET PREP: NONE SEEN

## 2016-12-12 LAB — ABO/RH: ABO/RH(D): A POS

## 2016-12-12 LAB — HCG, QUANTITATIVE, PREGNANCY: hCG, Beta Chain, Quant, S: 320 m[IU]/mL — ABNORMAL HIGH (ref <5)

## 2016-12-12 LAB — POCT PREGNANCY, URINE: Preg Test, Ur: POSITIVE — AB

## 2016-12-12 NOTE — MAU Note (Signed)
Pt stated she has been having lower abd pain on and off since the beginning of the week. She had some vaginal bleeding 2 days a go that was pinkish but then stopped after a few hours. Had a bad migrain H/A last night . Still has a headache today but it is mild,. Stated her periords are irregular. Was on the nuva ring up untik about 2-3 months ago and stopped due to insurance. UNsure when her LMP . Thinks she had one last month.

## 2016-12-12 NOTE — Discharge Instructions (Signed)
Abdominal Pain During Pregnancy °Belly (abdominal) pain is common during pregnancy. Most of the time, it is not a serious problem. Other times, it can be a sign that something is wrong with the pregnancy. Always tell your doctor if you have belly pain. °Follow these instructions at home: °Monitor your belly pain for any changes. The following actions may help you feel better: °· Do not have sex (intercourse) or put anything in your vagina until you feel better. °· Rest until your pain stops. °· Drink clear fluids if you feel sick to your stomach (nauseous). Do not eat solid food until you feel better. °· Only take medicine as told by your doctor. °· Keep all doctor visits as told. °Get help right away if: °· You are bleeding, leaking fluid, or pieces of tissue come out of your vagina. °· You have more pain or cramping. °· You keep throwing up (vomiting). °· You have pain when you pee (urinate) or have blood in your pee. °· You have a fever. °· You do not feel your baby moving as much. °· You feel very weak or feel like passing out. °· You have trouble breathing, with or without belly pain. °· You have a very bad headache and belly pain. °· You have fluid leaking from your vagina and belly pain. °· You keep having watery poop (diarrhea). °· Your belly pain does not go away after resting, or the pain gets worse. °This information is not intended to replace advice given to you by your health care provider. Make sure you discuss any questions you have with your health care provider. °Document Released: 12/04/2009 Document Revised: 07/24/2016 Document Reviewed: 07/15/2013 °Elsevier Interactive Patient Education © 2017 [employer] ° °

## 2016-12-12 NOTE — MAU Note (Signed)
cramping started a few days ago, was really bad last night. Mainly on the left side

## 2016-12-12 NOTE — MAU Provider Note (Signed)
History     CSN: [id number]  Arrival date and time: 12/12/16 [id number]   First Provider Initiated Contact with Patient 12/12/16 0912      Chief Complaint  Patient presents with  . Abdominal Pain   G3P2002 at unknown gestation here with left lower cramps x3 days. Cramping worsened last night. She did not use analgesics and tried to sleep through it without much success. She denies recent fevers. She is unsure of LMP but thinks may have been early November. She has been contracepting with condoms. She denies vaginal discharge but reports pink spotting about 2 days ago. No recent IC. No new partner in 6 years. She has not taken HPT.   OB History    Gravida Para Term Preterm AB Living   3 2 2  0 0 2   SAB TAB Ectopic Multiple Live Births   0 0 0 0 2      Past Medical History:  Diagnosis Date  . Asthma   . Preterm labor    PTL with first preg  . Pyloric stenosis     Past Surgical History:  Procedure Laterality Date  . PYLOROMYOTOMY      Family History  Problem Relation Age of Onset  . Arthritis Mother   . Cancer Mother     thyroid  . Depression Mother   . Hyperlipidemia Father   . Asthma Father   . Hypertension Father   . Heart disease Father   . COPD Maternal Grandfather   . Diabetes Paternal Grandmother   . Arthritis Paternal Grandmother   . Asthma Paternal Grandmother   . Heart disease Paternal Grandmother   . Depression Paternal Grandmother   . Hypertension Paternal Grandmother   . Hyperlipidemia Paternal Grandmother   . Other Neg Hx     Social History  Substance Use Topics  . Smoking status: Current Every Day Smoker    Packs/day: 0.50    Years: 5.00    Types: Cigarettes  . Smokeless tobacco: Never Used  . Alcohol use No    Allergies: No Known Allergies  Prescriptions Prior to Admission  Medication Sig Dispense Refill Last Dose  . albuterol (PROVENTIL HFA;VENTOLIN HFA) 108 (90 BASE) MCG/ACT inhaler Inhale 2 puffs into the lungs every 6 (six) hours as  needed for wheezing or shortness of breath.   10/08/2016 at Unknown time  . ondansetron (ZOFRAN ODT) 4 MG disintegrating tablet Take 1 tablet (4 mg total) by mouth every 8 (eight) hours as needed for nausea or vomiting. 20 tablet 0   . pantoprazole (PROTONIX) 40 MG tablet Take 1 tablet (40 mg total) by mouth daily. 30 tablet 1   . sucralfate (CARAFATE) 1 GM/10ML suspension Take 10 mLs (1 g total) by mouth 4 (four) times daily -  with meals and at bedtime. 420 mL 0     Review of Systems  Constitutional: Negative.   Gastrointestinal: Positive for abdominal pain. Negative for constipation, diarrhea, nausea and vomiting.  Genitourinary: Negative.    Physical Exam   Blood pressure 125/81, pulse 94, temperature 98.6 F (37 C), temperature source Oral, resp. rate 18, height 5\' 1"  (1.549 m), weight 71.2 kg (157 lb), unknown if currently breastfeeding.  Physical Exam  Constitutional: She is oriented to person, place, and time. She appears well-developed and well-nourished. No distress (appears comfortable).  HENT:  Head: Normocephalic and atraumatic.  Neck: Normal range of motion.  Cardiovascular: Normal rate.   Respiratory: Effort normal.  GI: Soft. She exhibits no distension and  no mass. There is no tenderness. There is no rebound and no guarding.  Genitourinary:  Genitourinary Comments: External: no lesions or erythema Vagina: rugated, parous, thin white discharge Uterus: non enlarged, anteverted, non tender, no CMT Adnexae: no masses, exquisite tenderness left, no tenderness right   Musculoskeletal: Normal range of motion.  Neurological: She is alert and oriented to person, place, and time.  Skin: Skin is warm and dry.  Psychiatric: She has a normal mood and affect.   Results for orders placed or performed during the hospital encounter of 12/12/16 (from the past 24 hour(s))  Urinalysis, Routine w reflex microscopic     Status: Abnormal   Collection Time: 12/12/16  9:05 AM  Result  Value Ref Range   Color, Urine AMBER (A) YELLOW   APPearance HAZY (A) CLEAR   Specific Gravity, Urine 1.028 1.005 - 1.030   pH 5.0 5.0 - 8.0   Glucose, UA NEGATIVE NEGATIVE mg/dL   Hgb urine dipstick NEGATIVE NEGATIVE   Bilirubin Urine NEGATIVE NEGATIVE   Ketones, ur 5 (A) NEGATIVE mg/dL   Protein, ur 30 (A) NEGATIVE mg/dL   Nitrite NEGATIVE NEGATIVE   Leukocytes, UA TRACE (A) NEGATIVE   RBC / HPF 0-5 0 - 5 RBC/hpf   WBC, UA 6-30 0 - 5 WBC/hpf   Bacteria, UA NONE SEEN NONE SEEN   Squamous Epithelial / LPF 6-30 (A) NONE SEEN   Mucous PRESENT   Pregnancy, urine POC     Status: Abnormal   Collection Time: 12/12/16  9:09 AM  Result Value Ref Range   Preg Test, Ur POSITIVE (A) NEGATIVE  CBC     Status: None   Collection Time: 12/12/16  9:23 AM  Result Value Ref Range   WBC 8.2 4.0 - 10.5 K/uL   RBC 4.60 3.87 - 5.11 MIL/uL   Hemoglobin 13.2 12.0 - 15.0 g/dL   HCT 39.4 36.0 - 46.0 %   MCV 85.7 78.0 - 100.0 fL   MCH 28.7 26.0 - 34.0 pg   MCHC 33.5 30.0 - 36.0 g/dL   RDW 14.6 11.5 - 15.5 %   Platelets 372 150 - 400 K/uL  ABO/Rh     Status: None   Collection Time: 12/12/16  9:23 AM  Result Value Ref Range   ABO/RH(D) A POS   hCG, quantitative, pregnancy     Status: Abnormal   Collection Time: 12/12/16  9:23 AM  Result Value Ref Range   hCG, Beta Chain, Quant, S 320 (H) <5 mIU/mL  Wet prep, genital     Status: Abnormal   Collection Time: 12/12/16  9:29 AM  Result Value Ref Range   Yeast Wet Prep HPF POC NONE SEEN NONE SEEN   Trich, Wet Prep NONE SEEN NONE SEEN   Clue Cells Wet Prep HPF POC NONE SEEN NONE SEEN   WBC, Wet Prep HPF POC FEW (A) NONE SEEN   Sperm NONE SEEN    US Ob Comp Less 14 Wks  Result Date: 12/12/2016 CLINICAL DATA:  Left-sided pelvic pain for 4 days. Gestational age by LMP of 5 weeks 2 days. Unsure of LMP. EXAM: OBSTETRIC <14 WK [name] AND TRANSVAGINAL OB US TECHNIQUE: Both transabdominal and transvaginal ultrasound examinations were performed for complete  evaluation of the gestation as well as the maternal uterus, adnexal regions, and pelvic cul-de-sac. Transvaginal technique was performed to assess early pregnancy. COMPARISON:  None. FINDINGS: Intrauterine gestational sac: None No fibroids or other uterine mass identified. Both ovaries are normal in appearance.  No adnexal mass or free fluid identified . IMPRESSION: No intrauterine gestational sac or adnexal mass identified, consistent with pregnancy of unknown location. Differential diagnosis includes recent spontaneous abortion, IUP too early to visualize, and non-visualized ectopic pregnancy. Recommend close follow up of quantitative B-HCG levels, and follow up US as clinically warranted. Electronically Signed   By: Earle Gell M.D.   On: 12/12/2016 10:35   US Ob Transvaginal  Result Date: 12/12/2016 CLINICAL DATA:  Left-sided pelvic pain for 4 days. Gestational age by LMP of 5 weeks 2 days. Unsure of LMP. EXAM: OBSTETRIC <14 WK [name] AND TRANSVAGINAL OB US TECHNIQUE: Both transabdominal and transvaginal ultrasound examinations were performed for complete evaluation of the gestation as well as the maternal uterus, adnexal regions, and pelvic cul-de-sac. Transvaginal technique was performed to assess early pregnancy. COMPARISON:  None. FINDINGS: Intrauterine gestational sac: None No fibroids or other uterine mass identified. Both ovaries are normal in appearance. No adnexal mass or free fluid identified . IMPRESSION: No intrauterine gestational sac or adnexal mass identified, consistent with pregnancy of unknown location. Differential diagnosis includes recent spontaneous abortion, IUP too early to visualize, and non-visualized ectopic pregnancy. Recommend close follow up of quantitative B-HCG levels, and follow up US as clinically warranted. Electronically Signed   By: Earle Gell M.D.   On: 12/12/2016 10:35   MAU Course  Procedures  MDM Labs and [name] ordered and reviewed. No IUP seen and low quant, likely  early pregnancy but cannot r/o failed pregnancy or ectopic. Pain likely from [facility]. Recommend rpt quant in 2 days, pt cannot come back due to other engagements, I stressed the importance of follow up due to the above differentials. Stable for discharge home.  Assessment and Plan   1. Spotting affecting pregnancy, antepartum   2. Abdominal pain in pregnancy   3. Blood type, Rh positive   4. Pregnancy of unknown anatomic location    Discharge home Follow up in 2 days in MAU for quant Ectopic precautions    Medication List    STOP taking these medications   ondansetron 4 MG disintegrating tablet Commonly known as:  ZOFRAN ODT   sucralfate 1 GM/10ML suspension Commonly known as:  CARAFATE     TAKE these medications   albuterol 108 (90 Base) MCG/ACT inhaler Commonly known as:  PROVENTIL HFA;VENTOLIN HFA Inhale 2 puffs into the lungs every 6 (six) hours as needed for wheezing or shortness of breath.   pantoprazole 40 MG tablet Commonly known as:  PROTONIX Take 1 tablet (40 mg total) by mouth daily.      Julianne Handler, CNM 12/12/2016, 9:13 AM

## 2016-12-13 LAB — GC/CHLAMYDIA PROBE AMP (~~LOC~~) NOT AT ARMC
Chlamydia: NEGATIVE
Neisseria Gonorrhea: NEGATIVE

## 2016-12-15 ENCOUNTER — Inpatient Hospital Stay (HOSPITAL_COMMUNITY)
Admission: AD | Admit: 2016-12-15 | Discharge: 2016-12-15 | Disposition: A | Discharge status: Home / Self Care | Payer: Medicaid Other | Source: Ambulatory Visit | Attending: Obstetrics and Gynecology | Admitting: Obstetrics and Gynecology

## 2016-12-15 ENCOUNTER — Encounter: Payer: Self-pay | Admitting: Advanced Practice Midwife

## 2016-12-15 ENCOUNTER — Telehealth: Payer: Self-pay | Admitting: Advanced Practice Midwife

## 2016-12-15 DIAGNOSIS — O200 Threatened abortion: Secondary | ICD-10-CM

## 2016-12-15 DIAGNOSIS — O3680X0 Pregnancy with inconclusive fetal viability, not applicable or unspecified: Secondary | ICD-10-CM

## 2016-12-15 DIAGNOSIS — O209 Hemorrhage in early pregnancy, unspecified: Secondary | ICD-10-CM | POA: Diagnosis not present

## 2016-12-15 LAB — HCG, QUANTITATIVE, PREGNANCY: HCG, BETA CHAIN, QUANT, S: 1028 m[IU]/mL — AB (ref <5)

## 2016-12-15 MED ORDER — CONCEPT OB 130-92.4-1 MG PO CAPS: 1.0000 | ORAL_CAPSULE | Freq: Every day | ORAL | 12 refills | Status: DC

## 2016-12-15 NOTE — Discharge Instructions (Signed)
Threatened Miscarriage °A threatened miscarriage occurs when you have vaginal bleeding during your first 20 weeks of pregnancy but the pregnancy has not ended. If you have vaginal bleeding during this time, your health care provider will do tests to make sure you are still pregnant. If the tests show you are still pregnant and the developing baby (fetus) inside your womb (uterus) is still growing, your condition is considered a threatened miscarriage. °A threatened miscarriage does not mean your pregnancy will end, but it does increase the risk of losing your pregnancy (complete miscarriage). °What are the causes? °The cause of a threatened miscarriage is usually not known. If you go on to have a complete miscarriage, the most common cause is an abnormal number of chromosomes in the developing baby. Chromosomes are the structures inside cells that hold all your genetic material. °Some causes of vaginal bleeding that do not result in miscarriage include: °· Having sex. °· Having an infection. °· Normal hormone changes of pregnancy. °· Bleeding that occurs when an egg implants in your uterus. °What increases the risk? °Risk factors for bleeding in early pregnancy include: °· Obesity. °· Smoking. °· Drinking excessive amounts of alcohol or caffeine. °· Recreational drug use. °What are the signs or symptoms? °· Light vaginal bleeding. °· Mild abdominal pain or cramps. °How is this diagnosed? °If you have bleeding with or without abdominal pain before 20 weeks of pregnancy, your health care provider will do tests to check whether you are still pregnant. One important test involves using sound waves and a computer (ultrasound) to create images of the inside of your uterus. Other tests include an internal exam of your vagina and uterus (pelvic exam) and measurement of your baby’s heart rate. °You may be diagnosed with a threatened miscarriage if: °· Ultrasound testing shows you are still pregnant. °· Your baby’s heart rate  is strong. °· A pelvic exam shows that the opening between your uterus and your vagina (cervix) is closed. °· Your heart rate and blood pressure are stable. °· Blood tests confirm you are still pregnant. °How is this treated? °No treatments have been shown to prevent a threatened miscarriage from going on to a complete miscarriage. However, the right home care is important. °Follow these instructions at home: °· Make sure you keep all your appointments for prenatal care. This is very important. °· Get plenty of rest. °· Do not have sex or use tampons if you have vaginal bleeding. °· Do not douche. °· Do not smoke or use recreational drugs. °· Do not drink alcohol. °· Avoid caffeine. °Contact a health care provider if: °· You have light vaginal bleeding or spotting while pregnant. °· You have abdominal pain or cramping. °· You have a fever. °Get help right away if: °· You have heavy vaginal bleeding. °· You have blood clots coming from your vagina. °· You have severe low back pain or abdominal cramps. °· You have fever, chills, and severe abdominal pain. °This information is not intended to replace advice given to you by your health care provider. Make sure you discuss any questions you have with your health care provider. °Document Released: 12/16/2005 Document Revised: 05/23/2016 Document Reviewed: 10/12/2013 °Elsevier Interactive Patient Education © 2017 [employer] ° °

## 2016-12-15 NOTE — MAU Note (Signed)
Here for f/u, repeat blood work.  Was unable to come yesterday.  Was 'just too busy".  Still crampy. No bleeding

## 2016-12-15 NOTE — Telephone Encounter (Signed)
Patient left MAU prior to results this afternoon. Called and notified of normal rise in [place]. Follow-up ultrasound scheduled in one week.

## 2016-12-15 NOTE — MAU Provider Note (Signed)
History    First Provider Initiated Contact with Patient 12/15/16 1756      Chief Complaint:  Follow-up   Peggy [name] is  25 y.o. [id number] No LMP recorded (lmp unknown). Patient is pregnant.. Patient is here for follow up of quantitative HCG and ongoing surveillance of pregnancy status.   She is Unknown weeks gestation  by LMP.    Since her last visit, the patient is without new complaint.     ROS Abdomin Pain: mild Vaginal bleeding: lighter than period.   Passage of clots or tissue: None Dizziness: None  Her previous Quantitative HCG values are:  Results for SOPHIAH, PAYES (MRN [id number]) as of 12/15/2016 18:10  Ref. Range 12/12/2016 09:23  HCG, Beta Chain, Quant, S Latest Ref Range: <5 mIU/mL 320 (H)    Physical Exam   BP 124/63 (BP Location: Left Arm)   Pulse 89   Temp 99.5 F (37.5 C) (Oral)   Resp 18   LMP  (LMP Unknown)  Constitutional: Well-nourished female in no apparent distress. No pallor Neuro: Alert and oriented 4 Cardiovascular: Normal rate Respiratory: Normal effort and rate Abdomen: Soft, nontender Gynecological Exam: examination not indicated  Labs: Results for orders placed or performed during the hospital encounter of 12/15/16 (from the past 24 hour(s))  hCG, quantitative, pregnancy   Collection Time: 12/15/16  5:37 PM  Result Value Ref Range   hCG, Beta Chain, Quant, S 1,028 (H) <5 mIU/mL    Ultrasound Studies:   US Ob Comp Less 14 Wks  Result Date: 12/12/2016 CLINICAL DATA:  Left-sided pelvic pain for 4 days. Gestational age by LMP of 5 weeks 2 days. Unsure of LMP. EXAM: OBSTETRIC <14 WK [name] AND TRANSVAGINAL OB US TECHNIQUE: Both transabdominal and transvaginal ultrasound examinations were performed for complete evaluation of the gestation as well as the maternal uterus, adnexal regions, and pelvic cul-de-sac. Transvaginal technique was performed to assess early pregnancy. COMPARISON:  None. FINDINGS: Intrauterine gestational sac:  None No fibroids or other uterine mass identified. Both ovaries are normal in appearance. No adnexal mass or free fluid identified . IMPRESSION: No intrauterine gestational sac or adnexal mass identified, consistent with pregnancy of unknown location. Differential diagnosis includes recent spontaneous abortion, IUP too early to visualize, and non-visualized ectopic pregnancy. Recommend close follow up of quantitative B-HCG levels, and follow up US as clinically warranted. Electronically Signed   By: Earle Gell M.D.   On: 12/12/2016 10:35   US Ob Transvaginal  Result Date: 12/12/2016 CLINICAL DATA:  Left-sided pelvic pain for 4 days. Gestational age by LMP of 5 weeks 2 days. Unsure of LMP. EXAM: OBSTETRIC <14 WK [name] AND TRANSVAGINAL OB US TECHNIQUE: Both transabdominal and transvaginal ultrasound examinations were performed for complete evaluation of the gestation as well as the maternal uterus, adnexal regions, and pelvic cul-de-sac. Transvaginal technique was performed to assess early pregnancy. COMPARISON:  None. FINDINGS: Intrauterine gestational sac: None No fibroids or other uterine mass identified. Both ovaries are normal in appearance. No adnexal mass or free fluid identified . IMPRESSION: No intrauterine gestational sac or adnexal mass identified, consistent with pregnancy of unknown location. Differential diagnosis includes recent spontaneous abortion, IUP too early to visualize, and non-visualized ectopic pregnancy. Recommend close follow up of quantitative B-HCG levels, and follow up US as clinically warranted. Electronically Signed   By: Earle Gell M.D.   On: 12/12/2016 10:35    MAU course/MDM: Quantitative hCG ordered  Pain and bleeding in early pregnancy with normal rise in  Quant and hemodynamically stable.  Assessment: Unknown weeks gestation w/ normal rise in Quant  Plan: Discharge home in stable condition. Can't stay for results. Called pt w/ results.  SAB/ectopic precautions    Follow-up Information    [facility] Follow up on 12/19/2016.   Specialty:  Radiology Why:  will call you to schedule ultrasound Contact information: [address] [id number] [place]       [facility] up.   Why:  as needed in emergencies Contact information: [address] [id number] [place] [phone]          Allergies as of 12/15/2016   No Known Allergies     Medication List    TAKE these medications   albuterol 108 (90 Base) MCG/ACT inhaler Commonly known as:  PROVENTIL HFA;VENTOLIN HFA Inhale 2 puffs into the lungs every 6 (six) hours as needed for wheezing or shortness of breath.   CONCEPT OB 130-92.4-1 MG Caps Take 1 tablet by mouth daily.   pantoprazole 40 MG tablet Commonly known as:  PROTONIX Take 1 tablet (40 mg total) by mouth daily.       Manya Silvas, [place] 12/15/2016, 6:11 PM  2/3

## 2016-12-16 ENCOUNTER — Telehealth: Payer: Self-pay | Admitting: *Deleted

## 2016-12-16 NOTE — Telephone Encounter (Signed)
Called pt and informed her of negative GC/CT test result.  Pt stated she did not know why that test was performed. After reviewing pt's chart, I advised that it was done due to her complaint of abdominal pain during recent visit to MAU. Pt voiced understanding.

## 2016-12-19 ENCOUNTER — Ambulatory Visit (HOSPITAL_COMMUNITY)
Admission: RE | Admit: 2016-12-19 | Discharge: 2016-12-19 | Disposition: A | Discharge status: Home / Self Care | Payer: Medicaid Other | Source: Ambulatory Visit | Attending: Advanced Practice Midwife | Admitting: Advanced Practice Midwife

## 2016-12-19 ENCOUNTER — Ambulatory Visit: Payer: Self-pay

## 2016-12-19 DIAGNOSIS — Z3200 Encounter for pregnancy test, result unknown: Secondary | ICD-10-CM

## 2016-12-19 DIAGNOSIS — O3680X0 Pregnancy with inconclusive fetal viability, not applicable or unspecified: Secondary | ICD-10-CM

## 2016-12-19 DIAGNOSIS — O200 Threatened abortion: Secondary | ICD-10-CM | POA: Insufficient documentation

## 2016-12-19 NOTE — Progress Notes (Signed)
Pt here today for [name] results.  Notified Dr.Pickens of pt results. Provider recommended to have f/u US scheduled in two weeks from today.  [name] scheduled for 01/03/16 @ 1100.  Pt notified.

## 2016-12-30 NOTE — L&D Delivery Note (Signed)
Delivery Note At 2:16 AM a viable female was delivered via  (Presentation: LOA).  APGAR: 9/9 ; weight  Pending.  After 1 minute, the cord was clamped and cut. 40 units of pitocin diluted in 1000cc LR was infused rapidly IV.  The placenta separated spontaneously and delivered via CCT and maternal pushing effort.  It was inspected and appears to be intact with a 3 VC.    Anesthesia:  epidural Episiotomy:  no Lacerations:  none Suture Repair: n/a Est. Blood Loss (mL):  50  Mom to postpartum.  Baby to Couplet care / Skin to Skin.  The above was performed by Augustina Mood medical student under my direct supervision and guidance.    CRESENZO-DISHMAN,Sabirin Baray 08/15/2017, 2:21 AM

## 2017-01-02 ENCOUNTER — Ambulatory Visit: Payer: Self-pay | Admitting: *Deleted

## 2017-01-02 ENCOUNTER — Encounter: Payer: Self-pay | Admitting: Obstetrics and Gynecology

## 2017-01-02 ENCOUNTER — Other Ambulatory Visit: Payer: Self-pay | Admitting: Obstetrics and Gynecology

## 2017-01-02 ENCOUNTER — Ambulatory Visit (HOSPITAL_COMMUNITY)
Admission: RE | Admit: 2017-01-02 | Discharge: 2017-01-02 | Disposition: A | Discharge status: Home / Self Care | Payer: Medicaid Other | Source: Ambulatory Visit | Attending: Obstetrics and Gynecology | Admitting: Obstetrics and Gynecology

## 2017-01-02 DIAGNOSIS — Z3689 Encounter for other specified antenatal screening: Secondary | ICD-10-CM | POA: Diagnosis not present

## 2017-01-02 DIAGNOSIS — Z3200 Encounter for pregnancy test, result unknown: Secondary | ICD-10-CM

## 2017-01-02 DIAGNOSIS — Z3A01 Less than 8 weeks gestation of pregnancy: Secondary | ICD-10-CM | POA: Insufficient documentation

## 2017-01-02 NOTE — Progress Notes (Signed)
Pt had [name] today in Radiology Dept which confirms viability of single IUP. Medication reconciliation completed and pregnancy verification letter given.

## 2017-01-15 ENCOUNTER — Inpatient Hospital Stay (HOSPITAL_COMMUNITY)
Admission: AD | Admit: 2017-01-15 | Discharge: 2017-01-15 | Disposition: A | Discharge status: Home / Self Care | Payer: Medicaid Other | Source: Ambulatory Visit | Attending: Obstetrics and Gynecology | Admitting: Obstetrics and Gynecology

## 2017-01-15 ENCOUNTER — Encounter (HOSPITAL_COMMUNITY): Payer: Self-pay | Admitting: *Deleted

## 2017-01-15 DIAGNOSIS — F17210 Nicotine dependence, cigarettes, uncomplicated: Secondary | ICD-10-CM | POA: Diagnosis not present

## 2017-01-15 DIAGNOSIS — K5901 Slow transit constipation: Secondary | ICD-10-CM

## 2017-01-15 DIAGNOSIS — R109 Unspecified abdominal pain: Secondary | ICD-10-CM | POA: Diagnosis not present

## 2017-01-15 DIAGNOSIS — K5900 Constipation, unspecified: Secondary | ICD-10-CM | POA: Insufficient documentation

## 2017-01-15 DIAGNOSIS — O219 Vomiting of pregnancy, unspecified: Secondary | ICD-10-CM

## 2017-01-15 DIAGNOSIS — R1032 Left lower quadrant pain: Secondary | ICD-10-CM | POA: Insufficient documentation

## 2017-01-15 DIAGNOSIS — O26891 Other specified pregnancy related conditions, first trimester: Secondary | ICD-10-CM | POA: Insufficient documentation

## 2017-01-15 DIAGNOSIS — O210 Mild hyperemesis gravidarum: Secondary | ICD-10-CM | POA: Diagnosis not present

## 2017-01-15 DIAGNOSIS — O99331 Smoking (tobacco) complicating pregnancy, first trimester: Secondary | ICD-10-CM | POA: Insufficient documentation

## 2017-01-15 DIAGNOSIS — Z3A08 8 weeks gestation of pregnancy: Secondary | ICD-10-CM | POA: Diagnosis not present

## 2017-01-15 LAB — URINALYSIS, ROUTINE W REFLEX MICROSCOPIC
BACTERIA UA: NONE SEEN
BILIRUBIN URINE: NEGATIVE
Glucose, UA: NEGATIVE mg/dL
Hgb urine dipstick: NEGATIVE
KETONES UR: NEGATIVE mg/dL
Nitrite: NEGATIVE
PH: 5 (ref 5.0–8.0)
PROTEIN: NEGATIVE mg/dL
Specific Gravity, Urine: 1.024 (ref 1.005–1.030)

## 2017-01-15 NOTE — MAU Provider Note (Signed)
History     CSN: [id number]  Arrival date and time: 01/15/17 [id number]   First Provider Initiated Contact with Patient 01/15/17 0109      Chief Complaint  Patient presents with  . Abdominal Cramping   HPI [name] is a 26 y.o. G3P2002 at [redacted]w[redacted]d who presents to MAU today with complaint of LLQ abdominal pain x weeks. She denies vaginal bleeding, discharge today. She endorses nausea with occasional vomiting and constipation. She denies fever.   OB History    Gravida Para Term Preterm AB Living   3 2 2  0 0 2   SAB TAB Ectopic Multiple Live Births   0 0 0 0 2      Past Medical History:  Diagnosis Date  . Asthma   . Preterm labor    PTL with first preg  . Pyloric stenosis     Past Surgical History:  Procedure Laterality Date  . PYLOROMYOTOMY      Family History  Problem Relation Age of Onset  . Arthritis Mother   . Cancer Mother     thyroid  . Depression Mother   . Hyperlipidemia Father   . Asthma Father   . Hypertension Father   . Heart disease Father   . COPD Maternal Grandfather   . Diabetes Paternal Grandmother   . Arthritis Paternal Grandmother   . Asthma Paternal Grandmother   . Heart disease Paternal Grandmother   . Depression Paternal Grandmother   . Hypertension Paternal Grandmother   . Hyperlipidemia Paternal Grandmother   . Other Neg Hx     Social History  Substance Use Topics  . Smoking status: Current Every Day Smoker    Packs/day: 0.50    Years: 5.00    Types: Cigarettes  . Smokeless tobacco: Never Used  . Alcohol use No    Allergies: No Known Allergies  Prescriptions Prior to Admission  Medication Sig Dispense Refill Last Dose  . albuterol (PROVENTIL HFA;VENTOLIN HFA) 108 (90 BASE) MCG/ACT inhaler Inhale 2 puffs into the lungs every 6 (six) hours as needed for wheezing or shortness of breath.   01/14/2017 at Unknown time  . Prenat w/o A Vit-FeFum-FePo-FA (CONCEPT OB) 130-92.4-1 MG CAPS Take 1 tablet by mouth daily. 30 capsule  12 01/14/2017 at Unknown time  . pantoprazole (PROTONIX) 40 MG tablet Take 1 tablet (40 mg total) by mouth daily. (Patient not taking: Reported on 01/02/2017) 30 tablet 1 Not Taking    Review of Systems  Constitutional: Negative for fever.  Gastrointestinal: Positive for abdominal pain, constipation, nausea and vomiting. Negative for diarrhea.  Genitourinary: Negative for dysuria, frequency, urgency, vaginal bleeding and vaginal discharge.   Physical Exam   Blood pressure 138/74, pulse 78, temperature 98.8 F (37.1 C), temperature source Oral, resp. rate 16, height 5' (1.524 m), weight 156 lb (70.8 kg), unknown if currently breastfeeding.  Physical Exam  Nursing note and vitals reviewed. Constitutional: She is oriented to person, place, and time. She appears well-developed and well-nourished. No distress.  HENT:  Head: Normocephalic and atraumatic.  Cardiovascular: Normal rate.   Respiratory: Effort normal.  GI: Soft. She exhibits no distension and no mass. There is no tenderness. There is no rebound and no guarding.  Neurological: She is alert and oriented to person, place, and time.  Skin: Skin is warm and dry. No erythema.  Psychiatric: She has a normal mood and affect.  Dilation: Closed Effacement (%): Thick Exam by:: Wenzel   Results for orders placed or performed  during the hospital encounter of 01/15/17 (from the past 24 hour(s))  Urinalysis, Routine w reflex microscopic     Status: Abnormal   Collection Time: 01/15/17 12:50 AM  Result Value Ref Range   Color, Urine YELLOW YELLOW   APPearance HAZY (A) CLEAR   Specific Gravity, Urine 1.024 1.005 - 1.030   pH 5.0 5.0 - 8.0   Glucose, UA NEGATIVE NEGATIVE mg/dL   Hgb urine dipstick NEGATIVE NEGATIVE   Bilirubin Urine NEGATIVE NEGATIVE   Ketones, ur NEGATIVE NEGATIVE mg/dL   Protein, ur NEGATIVE NEGATIVE mg/dL   Nitrite NEGATIVE NEGATIVE   Leukocytes, UA TRACE (A) NEGATIVE   RBC / HPF 0-5 0 - 5 RBC/hpf   WBC, UA 0-5 0 -  5 WBC/hpf   Bacteria, UA NONE SEEN NONE SEEN   Squamous Epithelial / LPF 0-5 (A) NONE SEEN   Mucous PRESENT     MAU Course  Procedures None  MDM UA today  Bedside US performed. Limitation of [name] discussed with patient prior to exam. +FHTs with [name], appear appropriate rate.   Assessment and Plan  A:  SIUP at [redacted]w[redacted]d Abdominal pain in pregnancy, first trimester Constipation  Nausea and vomiting in pregnancy prior to [redacted] weeks gestation   P: Discharge home OTC medications safe in pregnancy given Advised trial of Unisom and B6 for Nausea First trimester precautions discussed Patient advised to follow-up with [facility] as scheduled to start prenatal care Patient may return to MAU as needed or if her condition were to change or worsen   Luvenia Redden, PA-C  01/15/2017, 1:26 AM

## 2017-01-15 NOTE — Discharge Instructions (Signed)
Constipation, Adult Constipation is when a person:  Poops (has a bowel movement) fewer times in a week than normal.  Has a hard time pooping.  Has poop that is dry, hard, or bigger than normal. Follow these instructions at home: Eating and drinking  Eat foods that have a lot of fiber, such as:  Fresh fruits and vegetables.  Whole grains.  Beans.  Eat less of foods that are high in fat, low in fiber, or overly processed, such as:  [place] fries.  Hamburgers.  Cookies.  Candy.  Soda.  Drink enough fluid to keep your pee (urine) clear or pale yellow. General instructions  Exercise regularly or as told by your doctor.  Go to the restroom when you feel like you need to poop. Do not hold it in.  Take over-the-counter and prescription medicines only as told by your doctor. These include any fiber supplements.  Do pelvic floor retraining exercises, such as:  Doing deep breathing while relaxing your lower belly (abdomen).  Relaxing your pelvic floor while pooping.  Watch your condition for any changes.  Keep all follow-up visits as told by your doctor. This is important. Contact a doctor if:  You have pain that gets worse.  You have a fever.  You have not pooped for 4 days.  You throw up (vomit).  You are not hungry.  You lose weight.  You are bleeding from the anus.  You have thin, pencil-like poop (stool). Get help right away if:  You have a fever, and your symptoms suddenly get worse.  You leak poop or have blood in your poop.  Your belly feels hard or bigger than normal (is bloated).  You have very bad belly pain.  You feel dizzy or you faint. This information is not intended to replace advice given to you by your health care provider. Make sure you discuss any questions you have with your health care provider. Document Released: 06/03/2008 Document Revised: 07/05/2016 Document Reviewed: 06/05/2016 Elsevier Interactive Patient Education  2017  [place] for Nausea and Vomiting Hyperemesis gravidarum is a severe form of morning sickness. Because this condition causes severe nausea and vomiting, it can lead to dehydration, malnutrition, and weight loss. One way to lessen the symptoms of nausea and vomiting is to follow the eating plan for hyperemesis gravidarum. It is often used along with prescribed medicines to control your symptoms. What can I do to relieve my symptoms? Listen to your body. Everyone is different and has different preferences. Find what works best for you. Take any of the following actions that are helpful to you:  Eat and drink slowly.  Eat 5-6 small meals daily instead of 3 large meals.  Eat crackers before you get out of bed in the morning.  Try having a snack in the middle of the night.  Starchy foods are usually tolerated well. Examples include cereal, toast, bread, potatoes, pasta, rice, and pretzels.  Ginger may help with nausea. Add  tsp ground ginger to hot tea or choose ginger tea.  Try drinking 100% fruit juice or an electrolyte drink. An electrolyte drink contains sodium, potassium, and chloride.  Continue to take your prenatal vitamins as told by your health care provider. If you are having trouble taking your prenatal vitamins, talk with your health care provider about different options.  Include at least 1 serving of protein with your meals and snacks. Protein options include meats or poultry, beans, nuts, eggs, and yogurt. Try eating a  protein-rich snack before bed. Examples of these snacks include cheese and crackers or half of a peanut butter or [place] sandwich.  Consider eliminating foods that trigger your symptoms. These may include spicy foods, coffee, high-fat foods, very sweet foods, and acidic foods.  Try meals that have more protein combined with bland, salty, lower-fat, and dry foods, such as nuts, seeds, pretzels, crackers, and cereal.  Talk with your healthcare  provider about starting a supplement of vitamin B6.  Have fluids that are cold, clear, and carbonated or sour. Examples include lemonade, ginger ale, lemon-lime soda, ice water, and sparkling water.  Try lemon or mint tea.  Try brushing your teeth or using a mouth rinse after meals. What should I avoid to reduce my symptoms? Avoiding some of the following things may help reduce your symptoms.  Foods with strong smells. Try eating meals in well-ventilated areas that are free of odors.  Drinking water or other beverages with meals. Try not to drink anything during the 30 minutes before and after your meals.  Drinking more than 1 cup of fluid at a time. Sometimes using a straw helps.  Fried or high-fat foods, such as butter and cream sauces.  Spicy foods.  Skipping meals as best as you can. Nausea can be more intense on an empty stomach. If you cannot tolerate food at that time, do not force it. Try sucking on ice chips or other frozen items, and make up for missed calories later.  Lying down within 2 hours after eating.  Environmental triggers. These may include smoky rooms, closed spaces, rooms with strong smells, warm or humid places, overly loud and noisy rooms, and rooms with motion or flickering lights.  Quick and sudden changes in your movement. This information is not intended to replace advice given to you by your health care provider. Make sure you discuss any questions you have with your health care provider. Document Released: 10/13/2007 Document Revised: 08/14/2016 Document Reviewed: 07/16/2016 Elsevier Interactive Patient Education  2017 [place].  High-Fiber Diet Fiber, also called dietary fiber, is a type of carbohydrate found in fruits, vegetables, whole grains, and beans. A high-fiber diet can have many health benefits. Your health care provider may recommend a high-fiber diet to help:  Prevent constipation. Fiber can make your bowel movements more  regular.  Lower your cholesterol.  Relieve hemorrhoids, uncomplicated diverticulosis, or irritable bowel syndrome.  Prevent overeating as part of a weight-loss plan.  Prevent heart disease, type 2 diabetes, and certain cancers. What is my plan? The recommended daily intake of fiber includes:  38 grams for men under age 58.  37 grams for men over age 75.  41 grams for women under age 86.  42 grams for women over age 53. You can get the recommended daily intake of dietary fiber by eating a variety of fruits, vegetables, grains, and beans. Your health care provider may also recommend a fiber supplement if it is not possible to get enough fiber through your diet. What do I need to know about a high-fiber diet?  Fiber supplements have not been widely studied for their effectiveness, so it is better to get fiber through food sources.  Always check the fiber content on thenutrition facts label of any prepackaged food. Look for foods that contain at least 5 grams of fiber per serving.  Ask your dietitian if you have questions about specific foods that are related to your condition, especially if those foods are not listed in the following section.  Increase your daily fiber consumption gradually. Increasing your intake of dietary fiber too quickly may cause bloating, cramping, or gas.  Drink plenty of water. Water helps you to digest fiber. What foods can I eat? Grains  Whole-grain breads. Multigrain cereal. Oats and oatmeal. Brown rice. Barley. Bulgur wheat. [place]. Bran muffins. Popcorn. Rye wafer crackers. Vegetables  Sweet potatoes. Spinach. Kale. Artichokes. Cabbage. Broccoli. Green peas. Carrots. Squash. Fruits  Berries. Pears. Apples. Oranges. Avocados. Prunes and raisins. Dried figs. Meats and Other Protein Sources  [place], kidney, pinto, and soy beans. Split peas. Lentils. Nuts and seeds. Dairy  Fiber-fortified yogurt. Beverages  Fiber-fortified soy milk. Fiber-fortified  orange juice. Other  Fiber bars. The items listed above may not be a complete list of recommended foods or beverages. Contact your dietitian for more options.  What foods are not recommended? Grains  White bread. Pasta made with refined flour. White rice. Vegetables  Fried potatoes. Canned vegetables. Well-cooked vegetables. Fruits  Fruit juice. Cooked, strained fruit. Meats and Other Protein Sources  Fatty cuts of meat. Fried Sales executive or fried fish. Dairy  Milk. Yogurt. Cream cheese. Sour cream. Beverages  Soft drinks. Other  Cakes and pastries. Butter and oils. The items listed above may not be a complete list of foods and beverages to avoid. Contact your dietitian for more information.  What are some tips for including high-fiber foods in my diet?  Eat a wide variety of high-fiber foods.  Make sure that half of all grains consumed each day are whole grains.  Replace breads and cereals made from refined flour or white flour with whole-grain breads and cereals.  Replace white rice with brown rice, bulgur wheat, or millet.  Start the day with a breakfast that is high in fiber, such as a cereal that contains at least 5 grams of fiber per serving.  Use beans in place of meat in soups, salads, or pasta.  Eat high-fiber snacks, such as berries, raw vegetables, nuts, or popcorn. This information is not intended to replace advice given to you by your health care provider. Make sure you discuss any questions you have with your health care provider. Document Released: 12/16/2005 Document Revised: 05/23/2016 Document Reviewed: 05/31/2014 Elsevier Interactive Patient Education  2017 [employer].

## 2017-01-15 NOTE — MAU Note (Signed)
Pt reports abdominal pain that has been intermittent throughout her pregnancy. The pain returned about one hour ago and was "horrible". Cramping 4/10, back pain 6/10. Pain is located in her lower left abdomen and lower back.

## 2017-01-15 NOTE — MAU Note (Signed)
PT  SAYS SHE FEELS  CRAMPS - ALL PREG  - BUT WORSE  AT 2330-   WHILE  SITTING  ON  SOFA.      ALSO HAS BACK PAIN- ALWAYS HURTS  BUT  WORSE AT 2330.      HAVING  TROUBLE  HAVING  BM.   PLANS    TO GET  [facility]   WITH  FAMINA.      LAST SEX-    Friday.

## 2017-01-28 ENCOUNTER — Inpatient Hospital Stay (HOSPITAL_COMMUNITY)
Admission: AD | Admit: 2017-01-28 | Discharge: 2017-01-28 | Disposition: A | Discharge status: Home / Self Care | Payer: Medicaid Other | Source: Ambulatory Visit | Attending: Obstetrics and Gynecology | Admitting: Obstetrics and Gynecology

## 2017-01-28 ENCOUNTER — Encounter (HOSPITAL_COMMUNITY): Payer: Self-pay

## 2017-01-28 DIAGNOSIS — O99891 Other specified diseases and conditions complicating pregnancy: Secondary | ICD-10-CM

## 2017-01-28 DIAGNOSIS — Z113 Encounter for screening for infections with a predominantly sexual mode of transmission: Secondary | ICD-10-CM | POA: Diagnosis not present

## 2017-01-28 DIAGNOSIS — O23591 Infection of other part of genital tract in pregnancy, first trimester: Secondary | ICD-10-CM | POA: Diagnosis not present

## 2017-01-28 DIAGNOSIS — O219 Vomiting of pregnancy, unspecified: Secondary | ICD-10-CM | POA: Diagnosis not present

## 2017-01-28 DIAGNOSIS — Z3A10 10 weeks gestation of pregnancy: Secondary | ICD-10-CM

## 2017-01-28 DIAGNOSIS — O99331 Smoking (tobacco) complicating pregnancy, first trimester: Secondary | ICD-10-CM | POA: Insufficient documentation

## 2017-01-28 DIAGNOSIS — F17210 Nicotine dependence, cigarettes, uncomplicated: Secondary | ICD-10-CM | POA: Insufficient documentation

## 2017-01-28 DIAGNOSIS — O9989 Other specified diseases and conditions complicating pregnancy, childbirth and the puerperium: Secondary | ICD-10-CM

## 2017-01-28 DIAGNOSIS — O210 Mild hyperemesis gravidarum: Secondary | ICD-10-CM | POA: Diagnosis not present

## 2017-01-28 DIAGNOSIS — B9689 Other specified bacterial agents as the cause of diseases classified elsewhere: Secondary | ICD-10-CM | POA: Diagnosis not present

## 2017-01-28 DIAGNOSIS — M549 Dorsalgia, unspecified: Secondary | ICD-10-CM | POA: Diagnosis not present

## 2017-01-28 DIAGNOSIS — O26891 Other specified pregnancy related conditions, first trimester: Secondary | ICD-10-CM | POA: Diagnosis not present

## 2017-01-28 DIAGNOSIS — R109 Unspecified abdominal pain: Secondary | ICD-10-CM | POA: Diagnosis present

## 2017-01-28 DIAGNOSIS — N760 Acute vaginitis: Secondary | ICD-10-CM

## 2017-01-28 LAB — URINALYSIS, ROUTINE W REFLEX MICROSCOPIC
Bilirubin Urine: NEGATIVE
Glucose, UA: NEGATIVE mg/dL
Hgb urine dipstick: NEGATIVE
KETONES UR: NEGATIVE mg/dL
LEUKOCYTES UA: NEGATIVE
NITRITE: NEGATIVE
PROTEIN: NEGATIVE mg/dL
Specific Gravity, Urine: 1.02 (ref 1.005–1.030)
pH: 7 (ref 5.0–8.0)

## 2017-01-28 LAB — WET PREP, GENITAL
Sperm: NONE SEEN
TRICH WET PREP: NONE SEEN
YEAST WET PREP: NONE SEEN

## 2017-01-28 MED ORDER — PROMETHAZINE HCL 25 MG PO TABS: 25.0000 mg | ORAL_TABLET | Freq: Four times a day (QID) | ORAL | 0 refills | Status: DC | PRN

## 2017-01-28 MED ORDER — METRONIDAZOLE 500 MG PO TABS: 500.0000 mg | ORAL_TABLET | Freq: Two times a day (BID) | ORAL | 0 refills | Status: DC

## 2017-01-28 MED ORDER — PROMETHAZINE HCL 25 MG PO TABS
25.0000 mg | ORAL_TABLET | Freq: Four times a day (QID) | ORAL | Status: DC | PRN
  Administered 2017-01-28: 25 mg via ORAL
  Filled 2017-01-28: qty 1

## 2017-01-28 NOTE — Discharge Instructions (Signed)
Bacterial Vaginosis Bacterial vaginosis is an infection of the vagina. It happens when too many germs (bacteria) grow in the vagina. This infection puts you at risk for infections from sex (STIs). Treating this infection can lower your risk for some STIs. You should also treat this if you are pregnant. It can cause your baby to be born early. Follow these instructions at home: Medicines  Take over-the-counter and prescription medicines only as told by your doctor.  Take or use your antibiotic medicine as told by your doctor. Do not stop taking or using it even if you start to feel better. General instructions  If you your sexual partner is a woman, tell her that you have this infection. She needs to get treatment if she has symptoms. If you have a female partner, he does not need to be treated.  During treatment:  Avoid sex.  Do not douche.  Avoid alcohol as told.  Avoid breastfeeding as told.  Drink enough fluid to keep your pee (urine) clear or pale yellow.  Keep your vagina and butt (rectum) clean.  Wash the area with warm water every day.  Wipe from front to back after you use the toilet.  Keep all follow-up visits as told by your doctor. This is important. Preventing this condition  Do not douche.  Use only warm water to wash around your vagina.  Use protection when you have sex. This includes:  Latex condoms.  Dental dams.  Limit how many people you have sex with. It is best to only have sex with the same person (be monogamous).  Get tested for STIs. Have your partner get tested.  Wear underwear that is cotton or lined with cotton.  Avoid tight pants and pantyhose. This is most important in summer.  Do not use any products that have nicotine or tobacco in them. These include cigarettes and e-cigarettes. If you need help quitting, ask your doctor.  Do not use illegal drugs.  Limit how much alcohol you drink. Contact a doctor if:  Your symptoms do not get  better, even after you are treated.  You have more discharge or pain when you pee (urinate).  You have a fever.  You have pain in your belly (abdomen).  You have pain with sex.  Your bleed from your vagina between periods. Summary  This infection happens when too many germs (bacteria) grow in the vagina.  Treating this condition can lower your risk for some infections from sex (STIs).  You should also treat this if you are pregnant. It can cause early (premature) birth.  Do not stop taking or using your antibiotic medicine even if you start to feel better. This information is not intended to replace advice given to you by your health care provider. Make sure you discuss any questions you have with your health care provider. Document Released: 09/24/2008 Document Revised: 08/31/2016 Document Reviewed: 08/31/2016 Elsevier Interactive Patient Education  2017 [place].  Back Pain in Pregnancy Introduction Back pain during pregnancy is common. Back pain may be caused by several factors that are related to changes during your pregnancy. Follow these instructions at home: Managing pain, stiffness, and swelling  If directed, apply ice for sudden (acute) back pain.  Put ice in a plastic bag.  Place a towel between your skin and the bag.  Leave the ice on for 20 minutes, 2-3 times per day.  If directed, apply heat to the affected area before you exercise:  Place a towel between your  skin and the heat pack or heating pad.  Leave the heat on for 20-30 minutes.  Remove the heat if your skin turns bright red. This is especially important if you are unable to feel pain, heat, or cold. You may have a greater risk of getting burned. Activity  Exercise as told by your health care provider. Exercising is the best way to prevent or manage back pain.  Listen to your body when lifting. If lifting hurts, ask for help or bend your knees. This uses your leg muscles instead of your back  muscles.  Squat down when picking up something from the floor. Do not bend over.  Only use bed rest as told by your health care provider. Bed rest should only be used for the most severe episodes of back pain. Standing, Sitting, and Lying Down  Do not stand in one place for long periods of time.  Use good posture when sitting. Make sure your head rests over your shoulders and is not hanging forward. Use a pillow on your lower back if necessary.  Try sleeping on your side, preferably the left side, with a pillow or two between your legs. If you are sore after a night's rest, your bed may be too soft. A firm mattress may provide more support for your back during pregnancy. General instructions  Do not wear high heels.  Eat a healthy diet. Try to gain weight within your health care provider's recommendations.  Use a maternity girdle, elastic sling, or back brace as told by your health care provider.  Take over-the-counter and prescription medicines only as told by your health care provider.  Keep all follow-up visits as told by your health care provider. This is important. This includes any visits with any specialists, such as a physical therapist. Contact a health care provider if:  Your back pain interferes with your daily activities.  You have increasing pain in other parts of your body. Get help right away if:  You develop numbness, tingling, weakness, or problems with the use of your arms or legs.  You develop severe back pain that is not controlled with medicine.  You have a sudden change in bowel or bladder control.  You develop shortness of breath, dizziness, or you faint.  You develop nausea, vomiting, or sweating.  You have back pain that is a rhythmic, cramping pain similar to labor pains. Labor pain is usually 1-2 minutes apart, lasts for about 1 minute, and involves a bearing down feeling or pressure in your pelvis.  You have back pain and your water breaks or you  have vaginal bleeding.  You have back pain or numbness that travels down your leg.  Your back pain developed after you fell.  You develop pain on one side of your back.  You see blood in your urine.  You develop skin blisters in the area of your back pain. This information is not intended to replace advice given to you by your health care provider. Make sure you discuss any questions you have with your health care provider. Document Released: 03/26/2006 Document Revised: 05/23/2016 Document Reviewed: 08/30/2015  2017 Elsevier

## 2017-01-28 NOTE — MAU Note (Signed)
Been here a few times for the same thing.  Cramping, doesn't know if it is a UTI, BV or what.  Never gets checked when she is here.  Has started vomiting real bad, doesn't know if she has the bug that is going around or what.

## 2017-01-28 NOTE — MAU Provider Note (Signed)
History     CSN: [id number]  Arrival date and time: 01/28/17 1339   First Provider Initiated Contact with Patient 01/28/17 1557      Chief Complaint  Patient presents with  . Abdominal Cramping  . Emesis   G3P2002 @10 .3 weeks here with back pain and cramping x3 weeks. She describes as constant but worse at times in lower back. She has used warm bath and Tylenol and had some relief. She denies dysuria, hematuria, and fever. She endorses milky white vaginal discharge with internal burning x1 week. She denies new sexual partner in 9 months but is not currently with him. She endorses nausea and vomiting since late yesterday. She is able to tolerate po fluids but not food. She is requesting STD screen.   OB History    Gravida Para Term Preterm AB Living   3 2 2  0 0 2   SAB TAB Ectopic Multiple Live Births   0 0 0 0 2      Past Medical History:  Diagnosis Date  . Asthma   . Preterm labor    PTL with first preg  . Pyloric stenosis     Past Surgical History:  Procedure Laterality Date  . PYLOROMYOTOMY      Family History  Problem Relation Age of Onset  . Arthritis Mother   . Cancer Mother     thyroid  . Depression Mother   . Hyperlipidemia Father   . Asthma Father   . Hypertension Father   . Heart disease Father   . COPD Maternal Grandfather   . Diabetes Paternal Grandmother   . Arthritis Paternal Grandmother   . Asthma Paternal Grandmother   . Heart disease Paternal Grandmother   . Depression Paternal Grandmother   . Hypertension Paternal Grandmother   . Hyperlipidemia Paternal Grandmother   . Other Neg Hx     Social History  Substance Use Topics  . Smoking status: Current Every Day Smoker    Packs/day: 0.50    Years: 5.00    Types: Cigarettes  . Smokeless tobacco: Never Used  . Alcohol use No    Allergies: No Known Allergies  Prescriptions Prior to Admission  Medication Sig Dispense Refill Last Dose  . albuterol (PROVENTIL HFA;VENTOLIN HFA) 108 (90  BASE) MCG/ACT inhaler Inhale 2 puffs into the lungs every 6 (six) hours as needed for wheezing or shortness of breath.   01/14/2017 at Unknown time  . pantoprazole (PROTONIX) 40 MG tablet Take 1 tablet (40 mg total) by mouth daily. (Patient not taking: Reported on 01/02/2017) 30 tablet 1 Not Taking  . Prenat w/o A Vit-FeFum-FePo-FA (CONCEPT OB) 130-92.4-1 MG CAPS Take 1 tablet by mouth daily. 30 capsule 12 01/14/2017 at Unknown time    Review of Systems  Gastrointestinal: Positive for abdominal pain and constipation.  Genitourinary: Positive for vaginal discharge. Negative for dysuria, hematuria, urgency and vaginal bleeding.  Musculoskeletal: Positive for back pain.   Physical Exam   Blood pressure 133/72, pulse 87, temperature 98.1 F (36.7 C), temperature source Oral, resp. rate 18, height 5\' 1"  (1.549 m), weight 71 kg (156 lb 8 oz), SpO2 100 %, unknown if currently breastfeeding.  Physical Exam  Nursing note and vitals reviewed. Constitutional: She is oriented to person, place, and time. She appears well-developed and well-nourished. No distress.  HENT:  Head: Normocephalic and atraumatic.  Neck: Normal range of motion.  Cardiovascular: Normal rate.   Respiratory: Effort normal.  GI: Soft. She exhibits no distension and no mass.  There is no tenderness. There is no rebound, no guarding and no CVA tenderness.  Genitourinary:  Genitourinary Comments: External: no lesions or erythema Vagina: rugated, parous, thin white frothy discharge   Musculoskeletal: Normal range of motion.       Lumbar back: She exhibits no tenderness.  Neurological: She is alert and oriented to person, place, and time.  Skin: Skin is warm and dry.  Psychiatric: She has a normal mood and affect.   FHT: 145 bpm  Results for orders placed or performed during the hospital encounter of 01/28/17 (from the past 24 hour(s))  Urinalysis, Routine w reflex microscopic     Status: None   Collection Time: 01/28/17  2:15 PM   Result Value Ref Range   Color, Urine YELLOW YELLOW   APPearance CLEAR CLEAR   Specific Gravity, Urine 1.020 1.005 - 1.030   pH 7.0 5.0 - 8.0   Glucose, UA NEGATIVE NEGATIVE mg/dL   Hgb urine dipstick NEGATIVE NEGATIVE   Bilirubin Urine NEGATIVE NEGATIVE   Ketones, ur NEGATIVE NEGATIVE mg/dL   Protein, ur NEGATIVE NEGATIVE mg/dL   Nitrite NEGATIVE NEGATIVE   Leukocytes, UA NEGATIVE NEGATIVE  Wet prep, genital     Status: Abnormal   Collection Time: 01/28/17  4:12 PM  Result Value Ref Range   Yeast Wet Prep HPF POC NONE SEEN NONE SEEN   Trich, Wet Prep NONE SEEN NONE SEEN   Clue Cells Wet Prep HPF POC PRESENT (A) NONE SEEN   WBC, Wet Prep HPF POC MANY (A) NONE SEEN   Sperm NONE SEEN     MAU Course  Procedures Phenergan 25 mg po  MDM Labs ordered and reviewed. Nausea improved. No evidence of UTI. Will treat for BV. Pain likely MSK. Stable for discharge home.  Assessment and Plan   1. [redacted] weeks gestation of pregnancy   2. Nausea/vomiting in pregnancy   3. Back pain affecting pregnancy in first trimester   4. Screen for STD (sexually transmitted disease)   5. BV (bacterial vaginosis)    Discharge home Follow up at [facility] as scheduled in 1 week Tylenol prn Warm bath prn Rx Flagyl  Allergies as of 01/28/2017   No Known Allergies     Medication List    TAKE these medications   acetaminophen 325 MG tablet Commonly known as:  TYLENOL Take 650 mg by mouth every 6 (six) hours as needed for mild pain or headache.   albuterol 108 (90 Base) MCG/ACT inhaler Commonly known as:  PROVENTIL HFA;VENTOLIN HFA Inhale 2 puffs into the lungs every 6 (six) hours as needed for wheezing or shortness of breath.   CONCEPT OB 130-92.4-1 MG Caps Take 1 tablet by mouth daily.   metroNIDAZOLE 500 MG tablet Commonly known as:  FLAGYL Take 1 tablet (500 mg total) by mouth 2 (two) times daily.   pantoprazole 40 MG tablet Commonly known as:  PROTONIX Take 1 tablet (40 mg total) by  mouth daily.   promethazine 25 MG tablet Commonly known as:  PHENERGAN Take 1 tablet (25 mg total) by mouth every 6 (six) hours as needed for nausea or vomiting.      Julianne Handler, CNM 01/28/2017, 3:58 PM

## 2017-01-29 LAB — GC/CHLAMYDIA PROBE AMP (~~LOC~~) NOT AT ARMC
CHLAMYDIA, DNA PROBE: NEGATIVE
Neisseria Gonorrhea: NEGATIVE

## 2017-02-06 ENCOUNTER — Ambulatory Visit (INDEPENDENT_AMBULATORY_CARE_PROVIDER_SITE_OTHER): Payer: Medicaid Other | Admitting: Certified Nurse Midwife

## 2017-02-06 ENCOUNTER — Encounter: Payer: Self-pay | Admitting: Certified Nurse Midwife

## 2017-02-06 ENCOUNTER — Other Ambulatory Visit (HOSPITAL_COMMUNITY)
Admission: RE | Admit: 2017-02-06 | Discharge: 2017-02-06 | Disposition: A | Discharge status: Home / Self Care | Payer: Medicaid Other | Source: Ambulatory Visit | Attending: Certified Nurse Midwife | Admitting: Certified Nurse Midwife

## 2017-02-06 VITALS — BP 111/76 | HR 99 | Wt 159.0 lb

## 2017-02-06 DIAGNOSIS — Z113 Encounter for screening for infections with a predominantly sexual mode of transmission: Secondary | ICD-10-CM | POA: Diagnosis present

## 2017-02-06 DIAGNOSIS — J4541 Moderate persistent asthma with (acute) exacerbation: Secondary | ICD-10-CM

## 2017-02-06 DIAGNOSIS — O99511 Diseases of the respiratory system complicating pregnancy, first trimester: Secondary | ICD-10-CM

## 2017-02-06 DIAGNOSIS — Z3490 Encounter for supervision of normal pregnancy, unspecified, unspecified trimester: Secondary | ICD-10-CM

## 2017-02-06 DIAGNOSIS — Z01419 Encounter for gynecological examination (general) (routine) without abnormal findings: Secondary | ICD-10-CM | POA: Diagnosis not present

## 2017-02-06 DIAGNOSIS — J4522 Mild intermittent asthma with status asthmaticus: Secondary | ICD-10-CM

## 2017-02-06 DIAGNOSIS — Z3481 Encounter for supervision of other normal pregnancy, first trimester: Secondary | ICD-10-CM | POA: Diagnosis not present

## 2017-02-06 DIAGNOSIS — O219 Vomiting of pregnancy, unspecified: Secondary | ICD-10-CM

## 2017-02-06 LAB — POCT URINALYSIS DIPSTICK
Bilirubin, UA: NEGATIVE
Blood, UA: NEGATIVE
Glucose, UA: NEGATIVE
KETONES UA: NEGATIVE
Leukocytes, UA: NEGATIVE
Nitrite, UA: NEGATIVE
SPEC GRAV UA: 1.02
Urobilinogen, UA: NEGATIVE
pH, UA: 6

## 2017-02-06 MED ORDER — VITAFOL GUMMIES 3.33-0.333-34.8 MG PO CHEW
3.0000 | CHEWABLE_TABLET | Freq: Every day | ORAL | 12 refills | Status: DC
Start: 2017-02-06 — End: 2018-10-22

## 2017-02-06 MED ORDER — PROMETHAZINE HCL 25 MG PO TABS: 25.0000 mg | ORAL_TABLET | Freq: Four times a day (QID) | ORAL | 0 refills | Status: DC | PRN

## 2017-02-06 MED ORDER — RANITIDINE HCL 150 MG PO TABS: 150.0000 mg | ORAL_TABLET | Freq: Two times a day (BID) | ORAL | 5 refills | Status: DC

## 2017-02-06 MED ORDER — ALBUTEROL SULFATE HFA 108 (90 BASE) MCG/ACT IN AERS: 2.0000 | INHALATION_SPRAY | Freq: Four times a day (QID) | RESPIRATORY_TRACT | 99 refills | Status: DC | PRN

## 2017-02-06 MED ORDER — DOXYLAMINE-PYRIDOXINE 10-10 MG PO TBEC: DELAYED_RELEASE_TABLET | ORAL | 4 refills | Status: DC

## 2017-02-06 NOTE — Progress Notes (Signed)
Subjective:    Peggy [name] is being seen today for her first obstetrical visit.  This is a planned pregnancy. She is at [redacted]w[redacted]d gestation. Her obstetrical history is significant for none. Relationship with FOB: spouse, living together. Patient does intend to breast feed. Pregnancy history fully reviewed.  States that she is using her inhaler every day.  Has never seen pulmonology.    The information documented in the HPI was reviewed and verified.  Menstrual History: OB History    Gravida Para Term Preterm AB Living   3 2 2  0 0 2   SAB TAB Ectopic Multiple Live Births   0 0 0 0 2       Patient's last menstrual period was 11/10/2016 (exact date).    Past Medical History:  Diagnosis Date  . Asthma   . Preterm labor    PTL with first preg  . Pyloric stenosis     Past Surgical History:  Procedure Laterality Date  . PYLOROMYOTOMY       (Not in a hospital admission) No Known Allergies  Social History  Substance Use Topics  . Smoking status: Current Every Day Smoker    Packs/day: 0.50    Years: 5.00    Types: Cigarettes  . Smokeless tobacco: Never Used     Comment: 4 cig/day  . Alcohol use No    Family History  Problem Relation Age of Onset  . Arthritis Mother   . Cancer Mother     thyroid  . Depression Mother   . Hyperlipidemia Father   . Asthma Father   . Hypertension Father   . Heart disease Father   . COPD Maternal Grandfather   . Diabetes Paternal Grandmother   . Arthritis Paternal Grandmother   . Asthma Paternal Grandmother   . Heart disease Paternal Grandmother   . Depression Paternal Grandmother   . Hypertension Paternal Grandmother   . Hyperlipidemia Paternal Grandmother   . Other Neg Hx      Review of Systems Constitutional: negative for weight loss Gastrointestinal: + for vomiting Genitourinary:negative for genital lesions and vaginal discharge and dysuria Musculoskeletal:negative for back pain Behavioral/Psych: negative for abusive  relationship, depression, illegal drug usage and tobacco use    Objective:    BP 111/76   Pulse 99   Wt 159 lb (72.1 kg)   LMP 11/10/2016 (Exact Date)   BMI 30.04 kg/m  General Appearance:    Alert, cooperative, no distress, appears stated age  Head:    Normocephalic, without obvious abnormality, atraumatic  Eyes:    PERRL, conjunctiva/corneas clear, EOM's intact, fundi    benign, both eyes  Ears:    Normal TM's and external ear canals, both ears  Nose:   Nares normal, septum midline, mucosa normal, no drainage    or sinus tenderness  Throat:   Lips, mucosa, and tongue normal; teeth and gums normal  Neck:   Supple, symmetrical, trachea midline, no adenopathy;    thyroid:  no enlargement/tenderness/nodules; no carotid   bruit or JVD  Back:     Symmetric, no curvature, ROM normal, no CVA tenderness  Lungs:     Clear to auscultation bilaterally, respirations unlabored  Chest Wall:    No tenderness or deformity   Heart:    Regular rate and rhythm, S1 and S2 normal, no murmur, rub   or gallop  Breast Exam:    No tenderness, masses, or nipple abnormality  Abdomen:     Soft, non-tender, bowel sounds active  all four quadrants,    no masses, no organomegaly  Genitalia:    Normal female without lesion, discharge or tenderness  Extremities:   Extremities normal, atraumatic, no cyanosis or edema  Pulses:   2+ and symmetric all extremities  Skin:   Skin color, texture, turgor normal, no rashes or lesions  Lymph nodes:   Cervical, supraclavicular, and axillary nodes normal  Neurologic:   CNII-XII intact, normal strength, sensation and reflexes    throughout       Cervix:  Long, thick, closed and posterior.  FHR: 158 by doppler.  FH: less than U.     Lab Review Urine pregnancy test Labs reviewed yes Radiologic studies reviewed yes Assessment:    Pregnancy at [redacted]w[redacted]d weeks   Hx of asthma  Hx of maternal ptyalectasis with surgery  N&V in early pregnancy   Plan:      Prenatal vitamins.   Counseling provided regarding continued use of seat belts, cessation of alcohol consumption, smoking or use of illicit drugs; infection precautions i.e., influenza/TDAP immunizations, toxoplasmosis,CMV, parvovirus, listeria and varicella; workplace safety, exercise during pregnancy; routine dental care, safe medications, sexual activity, hot tubs, saunas, pools, travel, caffeine use, fish and methlymercury, potential toxins, hair treatments, varicose veins Weight gain recommendations per IOM guidelines reviewed: underweight/BMI< 18.5--> gain 28 - 40 lbs; normal weight/BMI 18.5 - 24.9--> gain 25 - 35 lbs; overweight/BMI 25 - 29.9--> gain 15 - 25 lbs; obese/BMI >30->gain  11 - 20 lbs Problem list reviewed and updated. FIRST/CF mutation testing/NIPT/QUAD SCREEN/fragile X/Ashkenazi Jewish population testing/Spinal muscular atrophy discussed: ordered. Role of ultrasound in pregnancy discussed; fetal survey: requested. Amniocentesis discussed: not indicated.  Meds ordered this encounter  Medications  . Prenatal MV-Min-FA-Omega-3 (PRENATAL GUMMIES/DHA & FA PO)    Sig: Take by mouth.  Marland Kitchen albuterol (PROVENTIL HFA;VENTOLIN HFA) 108 (90 Base) MCG/ACT inhaler    Sig: Inhale 2 puffs into the lungs every 6 (six) hours as needed for wheezing or shortness of breath.    Dispense:  18 g    Refill:  PRN  . ranitidine (ZANTAC) 150 MG tablet    Sig: Take 1 tablet (150 mg total) by mouth 2 (two) times daily.    Dispense:  60 tablet    Refill:  5  . promethazine (PHENERGAN) 25 MG tablet    Sig: Take 1 tablet (25 mg total) by mouth every 6 (six) hours as needed for nausea or vomiting.    Dispense:  30 tablet    Refill:  0  . Prenatal Vit-Fe Phos-FA-Omega (VITAFOL GUMMIES) 3.33-0.333-34.8 MG CHEW    Sig: Chew 3 tablets by mouth at bedtime.    Dispense:  90 tablet    Refill:  12  . Doxylamine-Pyridoxine (DICLEGIS) 10-10 MG TBEC    Sig: Take 1 tablet with breakfast and lunch.  Take 2 tablets at bedtime.     Dispense:  100 tablet    Refill:  4   Orders Placed This Encounter  Procedures  . Culture, OB Urine  . [name] MFM OB COMP + 14 WK    Standing Status:   Future    Standing Expiration Date:   04/06/2018    Order Specific Question:   Reason for Exam (SYMPTOM  OR DIAGNOSIS REQUIRED)    Answer:   anatomy    Order Specific Question:   Preferred imaging location?    Answer:   [facility]  . Hemoglobinopathy evaluation  . Varicella zoster antibody, IgG  . VITAMIN D 25 Hydroxy (Vit-D Deficiency,  Fractures)  . Cystic Fibrosis Mutation 97  . MaterniT21 PLUS Core+SCA    Order Specific Question:   Is the patient insulin dependent?    Answer:   No    Order Specific Question:   Please enter gestational age. This should be expressed as weeks AND days, i.e. 16w 6d. Enter weeks here. Enter days in next question.    Answer:   60    Order Specific Question:   Please enter gestational age. This should be expressed as weeks AND days, i.e. 16w 6d. Enter days here. Enter weeks in previous question.    Answer:   5    Order Specific Question:   How was gestational age calculated?    Answer:   LMP    Order Specific Question:   Please give the date of LMP OR Ultrasound OR Estimated date of delivery.    Answer:   08/23/2017    Order Specific Question:   Number of Fetuses (Type of Pregnancy):    Answer:   1    Order Specific Question:   Indications for performing the test? (please choose all that apply):    Answer:   Routine screening    Order Specific Question:   Other Indications? (Y=Yes, N=No)    Answer:   N    Order Specific Question:   If this is a repeat specimen, please indicate the reason:    Answer:   Not indicated    Order Specific Question:   Please specify the patient's race: (C=White/Caucasion, B=Black, I=Native American, A=Asian, H=Hispanic, O=Other, U=Unknown)    Answer:   B    Order Specific Question:   Donor Egg - indicate if the egg was obtained from in vitro fertilization.    Answer:   N     Order Specific Question:   Age of Egg Donor.    Answer:   39    Order Specific Question:   Prior Down Syndrome/ONTD screening during current pregnancy.    Answer:   N    Order Specific Question:   Prior First Trimester Testing    Answer:   N    Order Specific Question:   Prior Second Trimester Testing    Answer:   N    Order Specific Question:   Family History of Neural Tube Defects    Answer:   N    Order Specific Question:   Prior Pregnancy with Down Syndrome    Answer:   N    Order Specific Question:   Please give the patient's weight (in pounds)    Answer:   145  . Obstetric Panel, Including HIV  . ToxASSURE Select 13 (MW), Urine  . Ambulatory referral to Pulmonology    Referral Priority:   Routine    Referral Type:   Consultation    Referral Reason:   Specialty Services Required    Requested Specialty:   Pulmonary Disease    Number of Visits Requested:   1  . POCT urinalysis dipstick    Follow up in 4 weeks. 50% of 30 min visit spent on counseling and coordination of care.

## 2017-02-07 LAB — CERVICOVAGINAL ANCILLARY ONLY
Bacterial vaginitis: NEGATIVE
CHLAMYDIA, DNA PROBE: NEGATIVE
Candida vaginitis: NEGATIVE
NEISSERIA GONORRHEA: NEGATIVE
Trichomonas: NEGATIVE

## 2017-02-07 LAB — CYTOLOGY - PAP: Diagnosis: NEGATIVE

## 2017-02-09 LAB — CULTURE, OB URINE

## 2017-02-09 LAB — URINE CULTURE, OB REFLEX

## 2017-02-10 ENCOUNTER — Other Ambulatory Visit: Payer: Self-pay | Admitting: Certified Nurse Midwife

## 2017-02-10 DIAGNOSIS — Z3480 Encounter for supervision of other normal pregnancy, unspecified trimester: Secondary | ICD-10-CM

## 2017-02-11 ENCOUNTER — Telehealth: Payer: Self-pay

## 2017-02-11 LAB — OBSTETRIC PANEL, INCLUDING HIV
ANTIBODY SCREEN: NEGATIVE
BASOS: 0 %
Basophils Absolute: 0 10*3/uL (ref 0.0–0.2)
EOS (ABSOLUTE): 0.4 10*3/uL (ref 0.0–0.4)
EOS: 4 %
HEMOGLOBIN: 12.3 g/dL (ref 11.1–15.9)
HEP B S AG: NEGATIVE
HIV SCREEN 4TH GENERATION: NONREACTIVE
Hematocrit: 37.7 % (ref 34.0–46.6)
Immature Grans (Abs): 0 10*3/uL (ref 0.0–0.1)
Immature Granulocytes: 0 %
LYMPHS ABS: 2.5 10*3/uL (ref 0.7–3.1)
Lymphs: 26 %
MCH: 29 pg (ref 26.6–33.0)
MCHC: 32.6 g/dL (ref 31.5–35.7)
MCV: 89 fL (ref 79–97)
MONOS ABS: 0.6 10*3/uL (ref 0.1–0.9)
Monocytes: 7 %
NEUTROS ABS: 5.9 10*3/uL (ref 1.4–7.0)
Neutrophils: 63 %
Platelets: 382 10*3/uL — ABNORMAL HIGH (ref 150–379)
RBC: 4.24 x10E6/uL (ref 3.77–5.28)
RDW: 14.1 % (ref 12.3–15.4)
RH TYPE: POSITIVE
RPR: NONREACTIVE
Rubella Antibodies, IGG: 2.57 index (ref >0.99)
WBC: 9.5 10*3/uL (ref 3.4–10.8)

## 2017-02-11 LAB — HEMOGLOBINOPATHY EVALUATION
HEMOGLOBIN A2 QUANTITATION: 2.4 % (ref 1.8–3.2)
HGB A: 97.6 % (ref 96.4–98.8)
HGB C: 0 %
HGB S: 0 %
HGB VARIANT: 0 %
Hemoglobin F Quantitation: 0 % (ref 0.0–2.0)

## 2017-02-11 LAB — CYSTIC FIBROSIS MUTATION 97: Interpretation: NOT DETECTED

## 2017-02-11 LAB — VARICELLA ZOSTER ANTIBODY, IGG: Varicella zoster IgG: 400 index (ref >165)

## 2017-02-11 LAB — VITAMIN D 25 HYDROXY (VIT D DEFICIENCY, FRACTURES): VIT D 25 HYDROXY: 23.2 ng/mL — AB (ref 30.0–100.0)

## 2017-02-11 NOTE — Telephone Encounter (Signed)
Pt called requesting Mat 21 results drawn 02/06/17. Pt aware test still pending and can take up to 2 wks.

## 2017-02-12 ENCOUNTER — Other Ambulatory Visit: Payer: Self-pay | Admitting: Certified Nurse Midwife

## 2017-02-12 ENCOUNTER — Telehealth: Payer: Self-pay

## 2017-02-12 DIAGNOSIS — R7989 Other specified abnormal findings of blood chemistry: Secondary | ICD-10-CM | POA: Insufficient documentation

## 2017-02-12 MED ORDER — VITAMIN D (ERGOCALCIFEROL) 1.25 MG (50000 UNIT) PO CAPS: 50000.0000 [IU] | ORAL_CAPSULE | ORAL | 2 refills | Status: DC

## 2017-02-12 NOTE — Telephone Encounter (Signed)
Contacted patient and advised of lab results and rx sent by provider. Patient stated that she would call back for gender results when they come in.

## 2017-02-14 LAB — MATERNIT21 PLUS CORE+SCA
CHROMOSOME 18: NEGATIVE
Chromosome 13: NEGATIVE
Chromosome 21: NEGATIVE
Y CHROMOSOME: NOT DETECTED

## 2017-02-14 LAB — TOXASSURE SELECT 13 (MW), URINE

## 2017-02-17 ENCOUNTER — Other Ambulatory Visit: Payer: Self-pay | Admitting: Certified Nurse Midwife

## 2017-02-17 DIAGNOSIS — Z3480 Encounter for supervision of other normal pregnancy, unspecified trimester: Secondary | ICD-10-CM

## 2017-02-19 ENCOUNTER — Telehealth: Payer: Self-pay

## 2017-02-19 NOTE — Telephone Encounter (Signed)
Patient called in for maternity 21 results

## 2017-02-19 NOTE — Telephone Encounter (Signed)
Returned call, no answer, left vm 

## 2017-03-03 ENCOUNTER — Encounter: Payer: Self-pay | Admitting: Obstetrics

## 2017-03-03 ENCOUNTER — Ambulatory Visit (INDEPENDENT_AMBULATORY_CARE_PROVIDER_SITE_OTHER): Payer: Medicaid Other | Admitting: Obstetrics

## 2017-03-03 ENCOUNTER — Other Ambulatory Visit (HOSPITAL_COMMUNITY)
Admission: RE | Admit: 2017-03-03 | Discharge: 2017-03-03 | Disposition: A | Discharge status: Home / Self Care | Payer: Medicaid Other | Source: Ambulatory Visit | Attending: Obstetrics | Admitting: Obstetrics

## 2017-03-03 DIAGNOSIS — Z3A15 15 weeks gestation of pregnancy: Secondary | ICD-10-CM | POA: Diagnosis not present

## 2017-03-03 DIAGNOSIS — Z3482 Encounter for supervision of other normal pregnancy, second trimester: Secondary | ICD-10-CM | POA: Insufficient documentation

## 2017-03-03 DIAGNOSIS — Z3480 Encounter for supervision of other normal pregnancy, unspecified trimester: Secondary | ICD-10-CM

## 2017-03-03 NOTE — Progress Notes (Signed)
Pt c/o L side pelvic cramping and mid abdominal pain x 3 days. Mucous discharge no odor no itching.

## 2017-03-03 NOTE — Progress Notes (Signed)
Subjective:  Peggy [name] is a 26 y.o. G3P2002 at [redacted]w[redacted]d being seen today for ongoing prenatal care.  She is currently monitored for the following issues for this low-risk pregnancy and has Smoker; Supervision of normal pregnancy, antepartum; and Low vitamin D level on her problem list.  Patient reports cramping.  Contractions: Not present. Vag. Bleeding: None.  Movement: Absent. Denies leaking of fluid.   The following portions of the patient's history were reviewed and updated as appropriate: allergies, current medications, past family history, past medical history, past social history, past surgical history and problem list. Problem list updated.  Objective:   Vitals:   03/03/17 1126  BP: 125/79  Pulse: (!) 106  Weight: 159 lb 9.6 oz (72.4 kg)    Fetal Status:     Movement: Absent     General:  Alert, oriented and cooperative. Patient is in no acute distress.  Skin: Skin is warm and dry. No rash noted.   Cardiovascular: Normal heart rate noted  Respiratory: Normal respiratory effort, no problems with respiration noted  Abdomen: Soft, gravid, appropriate for gestational age. Pain/Pressure: Present     Pelvic:  Cervical exam performed      Cvx:  Long / Closed  Extremities: Normal range of motion.  Edema: None  Mental Status: Normal mood and affect. Normal behavior. Normal judgment and thought content.   Urinalysis:      Assessment and Plan:  Pregnancy: G3P2002 at [redacted]w[redacted]d  1. Supervision of other normal pregnancy, antepartum   Preterm labor symptoms and general obstetric precautions including but not limited to vaginal bleeding, contractions, leaking of fluid and fetal movement were reviewed in detail with the patient. Please refer to After Visit Summary for other counseling recommendations.  F/U 4 weeks   Shelly Bombard, MDPatient ID: Peggy [name], female   DOB: March 30, 1991, 26 y.o.   MRN: [id number]

## 2017-03-05 LAB — CERVICOVAGINAL ANCILLARY ONLY
BACTERIAL VAGINITIS: NEGATIVE
CANDIDA VAGINITIS: NEGATIVE
CHLAMYDIA, DNA PROBE: NEGATIVE
NEISSERIA GONORRHEA: NEGATIVE
TRICH (WINDOWPATH): POSITIVE — AB

## 2017-03-06 ENCOUNTER — Other Ambulatory Visit: Payer: Self-pay | Admitting: Obstetrics

## 2017-03-06 DIAGNOSIS — A5901 Trichomonal vulvovaginitis: Secondary | ICD-10-CM

## 2017-03-06 MED ORDER — TINIDAZOLE 500 MG PO TABS: 2.0000 g | ORAL_TABLET | Freq: Once | ORAL | 0 refills | Status: AC

## 2017-03-07 ENCOUNTER — Telehealth: Payer: Self-pay

## 2017-03-07 NOTE — Telephone Encounter (Signed)
Attempted to contact, no answer, vm is full.

## 2017-03-09 LAB — AFP, QUAD SCREEN
DIA Mom Value: 0.49
DIA Value (EIA): 90.14 pg/mL
DSR (By Age)    1 IN: 1063
DSR (Second Trimester) 1 IN: 9618
GESTATIONAL AGE AFP: 15.3 wk
MSAFP MOM: 0.88
MSAFP: 24.7 ng/mL
MSHCG MOM: 0.58
MSHCG: 26932 m[IU]/mL
Maternal Age At EDD: 24.2 YEARS
Osb Risk: 10000
Test Results:: NEGATIVE
UE3 MOM: 0.62
UE3 VALUE: 0.4 ng/mL
Weight: 159 [lb_av]

## 2017-03-10 ENCOUNTER — Telehealth: Payer: Self-pay

## 2017-03-10 NOTE — Telephone Encounter (Signed)
Returned call, no answer, left vm to call 

## 2017-03-20 ENCOUNTER — Other Ambulatory Visit: Payer: Self-pay | Admitting: Obstetrics and Gynecology

## 2017-03-26 ENCOUNTER — Inpatient Hospital Stay (EMERGENCY_DEPARTMENT_HOSPITAL)
Admission: AD | Admit: 2017-03-26 | Discharge: 2017-03-27 | Disposition: A | Discharge status: Home / Self Care | Payer: Medicaid Other | Source: Ambulatory Visit | Attending: Obstetrics & Gynecology | Admitting: Obstetrics & Gynecology

## 2017-03-26 ENCOUNTER — Encounter (HOSPITAL_COMMUNITY): Payer: Self-pay | Admitting: *Deleted

## 2017-03-26 DIAGNOSIS — O99512 Diseases of the respiratory system complicating pregnancy, second trimester: Secondary | ICD-10-CM

## 2017-03-26 DIAGNOSIS — O99332 Smoking (tobacco) complicating pregnancy, second trimester: Secondary | ICD-10-CM | POA: Insufficient documentation

## 2017-03-26 DIAGNOSIS — Z3A18 18 weeks gestation of pregnancy: Secondary | ICD-10-CM

## 2017-03-26 DIAGNOSIS — J069 Acute upper respiratory infection, unspecified: Secondary | ICD-10-CM | POA: Insufficient documentation

## 2017-03-26 DIAGNOSIS — F17210 Nicotine dependence, cigarettes, uncomplicated: Secondary | ICD-10-CM | POA: Insufficient documentation

## 2017-03-26 DIAGNOSIS — J4521 Mild intermittent asthma with (acute) exacerbation: Secondary | ICD-10-CM

## 2017-03-26 LAB — URINALYSIS, ROUTINE W REFLEX MICROSCOPIC
BILIRUBIN URINE: NEGATIVE
GLUCOSE, UA: NEGATIVE mg/dL
Hgb urine dipstick: NEGATIVE
Ketones, ur: 5 mg/dL — AB
NITRITE: NEGATIVE
Protein, ur: NEGATIVE mg/dL
SPECIFIC GRAVITY, URINE: 1.031 — AB (ref 1.005–1.030)
pH: 6 (ref 5.0–8.0)

## 2017-03-26 MED ORDER — IPRATROPIUM-ALBUTEROL 0.5-2.5 (3) MG/3ML IN SOLN
3.0000 mL | Freq: Four times a day (QID) | RESPIRATORY_TRACT | Status: DC
  Administered 2017-03-26: 3 mL via RESPIRATORY_TRACT
  Filled 2017-03-26 (×3): qty 3

## 2017-03-26 NOTE — MAU Provider Note (Signed)
History     CSN: [id number]  Arrival date and time: 03/26/17 2146   First Provider Initiated Contact with Patient 03/26/17 2228      Chief Complaint  Patient presents with  . Shortness of Breath  . Chest Pain   Peggy [name] is a 26 y.o G3P2002 at [redacted]w[redacted]d who presents today with cough, and shortness of breath x 3 days. She reports that she is getting over a cold. She has a hx of asthma, and has been using her inhaler. She reports it is not helping. She last used it just prior to arrival. She reports chest tightness, but not chest pain. She denies any abdominal pain, VB or LOF. No OB complaints.    Shortness of Breath  This is a new problem. The current episode started in the past 7 days. The problem occurs intermittently. The problem has been unchanged. Associated symptoms include wheezing. Pertinent negatives include no fever or sore throat. She has tried beta agonist inhalers and OTC cough suppressants for the symptoms.   Past Medical History:  Diagnosis Date  . Asthma   . Preterm labor    PTL with first preg  . Pyloric stenosis     Past Surgical History:  Procedure Laterality Date  . PYLOROMYOTOMY      Family History  Problem Relation Age of Onset  . Arthritis Mother   . Cancer Mother     thyroid  . Depression Mother   . Hyperlipidemia Father   . Asthma Father   . Hypertension Father   . Heart disease Father   . COPD Maternal Grandfather   . Diabetes Paternal Grandmother   . Arthritis Paternal Grandmother   . Asthma Paternal Grandmother   . Heart disease Paternal Grandmother   . Depression Paternal Grandmother   . Hypertension Paternal Grandmother   . Hyperlipidemia Paternal Grandmother   . Other Neg Hx     Social History  Substance Use Topics  . Smoking status: Current Every Day Smoker    Packs/day: 0.50    Years: 5.00    Types: Cigarettes  . Smokeless tobacco: Never Used     Comment: 4 cig/day  . Alcohol use No    Allergies: No Known  Allergies  Prescriptions Prior to Admission  Medication Sig Dispense Refill Last Dose  . albuterol (PROVENTIL HFA;VENTOLIN HFA) 108 (90 Base) MCG/ACT inhaler Inhale 2 puffs into the lungs every 6 (six) hours as needed for wheezing or shortness of breath. 18 g PRN 03/26/2017 at 2145  . Doxylamine-Pyridoxine (DICLEGIS) 10-10 MG TBEC Take 1 tablet with breakfast and lunch.  Take 2 tablets at bedtime. 100 tablet 4 03/25/2017 at Unknown time  . Prenatal Vit-Fe Phos-FA-Omega (VITAFOL GUMMIES) 3.33-0.333-34.8 MG CHEW Chew 3 tablets by mouth at bedtime. 90 tablet 12 03/26/2017 at Unknown time  . Vitamin D, Ergocalciferol, (DRISDOL) 50000 units CAPS capsule Take 1 capsule (50,000 Units total) by mouth every 7 (seven) days. 30 capsule 2 03/26/2017 at Unknown time  . acetaminophen (TYLENOL) 325 MG tablet Take 650 mg by mouth every 6 (six) hours as needed for mild pain or headache.   Taking  . metroNIDAZOLE (FLAGYL) 500 MG tablet Take 1 tablet (500 mg total) by mouth 2 (two) times daily. (Patient not taking: Reported on 02/06/2017) 14 tablet 0 Not Taking  . promethazine (PHENERGAN) 25 MG tablet Take 1 tablet (25 mg total) by mouth every 6 (six) hours as needed for nausea or vomiting. (Patient not taking: Reported on 03/03/2017) 30 tablet  0 Not Taking  . ranitidine (ZANTAC) 150 MG tablet Take 1 tablet (150 mg total) by mouth 2 (two) times daily. (Patient not taking: Reported on 03/03/2017) 60 tablet 5 Not Taking    Review of Systems  Constitutional: Negative for chills and fever.  HENT: Positive for congestion. Negative for sore throat.   Respiratory: Positive for cough, chest tightness, shortness of breath and wheezing.   Genitourinary: Negative for vaginal bleeding.   Physical Exam   Blood pressure (!) 111/59, pulse (!) 101, temperature 98.5 F (36.9 C), temperature source Oral, resp. rate 19, last menstrual period 11/10/2016, SpO2 97 %, unknown if currently breastfeeding.  Physical Exam  Nursing note and  vitals reviewed. Constitutional: She is oriented to person, place, and time. She appears well-developed and well-nourished. No distress.  HENT:  Head: Normocephalic.  Cardiovascular: Normal rate.   Respiratory: Effort normal. She has wheezes.  GI: Soft. There is no tenderness. There is no rebound.  Neurological: She is alert and oriented to person, place, and time.  Skin: Skin is warm and dry.  Psychiatric: She has a normal mood and affect.  FHT: 147 with doppler   MAU Course  Procedures  MDM Patient has had duoneb treatment here. She is reporting feeling better. Wheezes have decreased.  ekg: NSR  Assessment and Plan   1. Upper respiratory infection with cough and congestion   2. Mild intermittent asthma with acute exacerbation    DC home Comfort measures reviewed  2nd/3rd Trimester precautions  RX: prednisone 40mg  QD x 10, Augmentin 875 BID X 7 days, albuterol MDI #1 with 1RF  Return to MAU as needed FU with OB as planned  Follow-up Information    HARPER,CHARLES A, MD Follow up.   Specialty:  Obstetrics and Gynecology Contact information: [address] [place] [address] [phone]            Mathis Bud 03/26/2017, 10:29 PM

## 2017-03-26 NOTE — MAU Note (Signed)
Pt states that she is getting over a cold. Started having SOB and pressure/tightness in chest last night that has continued throughout the day. Pt has a hx of asthma and has used inhaler several times today, which has not helped. Pt denies cramping, vaginal bleeding or LOF.

## 2017-03-27 ENCOUNTER — Encounter (HOSPITAL_COMMUNITY): Payer: Self-pay | Admitting: *Deleted

## 2017-03-27 ENCOUNTER — Inpatient Hospital Stay (HOSPITAL_COMMUNITY)
Admission: AD | Admit: 2017-03-27 | Discharge: 2017-03-29 | DRG: 781 | Disposition: A | Discharge status: Home / Self Care | Payer: Medicaid Other | Source: Ambulatory Visit | Attending: Obstetrics and Gynecology | Admitting: Obstetrics and Gynecology

## 2017-03-27 ENCOUNTER — Inpatient Hospital Stay (HOSPITAL_COMMUNITY): Payer: Medicaid Other

## 2017-03-27 ENCOUNTER — Encounter: Payer: Self-pay | Admitting: Emergency Medicine

## 2017-03-27 DIAGNOSIS — O99332 Smoking (tobacco) complicating pregnancy, second trimester: Secondary | ICD-10-CM | POA: Diagnosis present

## 2017-03-27 DIAGNOSIS — J069 Acute upper respiratory infection, unspecified: Secondary | ICD-10-CM

## 2017-03-27 DIAGNOSIS — Z833 Family history of diabetes mellitus: Secondary | ICD-10-CM | POA: Diagnosis not present

## 2017-03-27 DIAGNOSIS — Z3A18 18 weeks gestation of pregnancy: Secondary | ICD-10-CM | POA: Diagnosis not present

## 2017-03-27 DIAGNOSIS — O99512 Diseases of the respiratory system complicating pregnancy, second trimester: Secondary | ICD-10-CM | POA: Diagnosis present

## 2017-03-27 DIAGNOSIS — J181 Lobar pneumonia, unspecified organism: Secondary | ICD-10-CM

## 2017-03-27 DIAGNOSIS — J4521 Mild intermittent asthma with (acute) exacerbation: Secondary | ICD-10-CM | POA: Diagnosis present

## 2017-03-27 DIAGNOSIS — J45901 Unspecified asthma with (acute) exacerbation: Secondary | ICD-10-CM | POA: Diagnosis not present

## 2017-03-27 DIAGNOSIS — R0602 Shortness of breath: Secondary | ICD-10-CM

## 2017-03-27 DIAGNOSIS — O99519 Diseases of the respiratory system complicating pregnancy, unspecified trimester: Secondary | ICD-10-CM

## 2017-03-27 DIAGNOSIS — F17210 Nicotine dependence, cigarettes, uncomplicated: Secondary | ICD-10-CM | POA: Diagnosis present

## 2017-03-27 DIAGNOSIS — J189 Pneumonia, unspecified organism: Secondary | ICD-10-CM | POA: Diagnosis present

## 2017-03-27 DIAGNOSIS — Z3480 Encounter for supervision of other normal pregnancy, unspecified trimester: Secondary | ICD-10-CM

## 2017-03-27 DIAGNOSIS — Z8249 Family history of ischemic heart disease and other diseases of the circulatory system: Secondary | ICD-10-CM

## 2017-03-27 DIAGNOSIS — J45909 Unspecified asthma, uncomplicated: Secondary | ICD-10-CM

## 2017-03-27 LAB — CBC WITH DIFFERENTIAL/PLATELET
Basophils Absolute: 0 10*3/uL (ref 0.0–0.1)
Basophils Relative: 0 %
EOS PCT: 1 %
Eosinophils Absolute: 0.2 10*3/uL (ref 0.0–0.7)
HEMATOCRIT: 35.8 % — AB (ref 36.0–46.0)
HEMOGLOBIN: 12.3 g/dL (ref 12.0–15.0)
LYMPHS ABS: 1.5 10*3/uL (ref 0.7–4.0)
LYMPHS PCT: 9 %
MCH: 29.8 pg (ref 26.0–34.0)
MCHC: 34.4 g/dL (ref 30.0–36.0)
MCV: 86.7 fL (ref 78.0–100.0)
Monocytes Absolute: 0.4 10*3/uL (ref 0.1–1.0)
Monocytes Relative: 2 %
NEUTROS ABS: 13.8 10*3/uL — AB (ref 1.7–7.7)
NEUTROS PCT: 88 %
PLATELETS: 412 10*3/uL — AB (ref 150–400)
RBC: 4.13 MIL/uL (ref 3.87–5.11)
RDW: 13.2 % (ref 11.5–15.5)
WBC: 15.9 10*3/uL — AB (ref 4.0–10.5)

## 2017-03-27 MED ORDER — DEXTROSE 5 % IV SOLN
500.0000 mg | INTRAVENOUS | Status: DC
  Administered 2017-03-28 (×2): 500 mg via INTRAVENOUS
  Filled 2017-03-27 (×2): qty 500

## 2017-03-27 MED ORDER — PREDNISONE 20 MG PO TABS: 40.0000 mg | ORAL_TABLET | Freq: Every day | ORAL | 0 refills | Status: DC

## 2017-03-27 MED ORDER — DOCUSATE SODIUM 100 MG PO CAPS
100.0000 mg | ORAL_CAPSULE | Freq: Every day | ORAL | Status: DC
  Administered 2017-03-28: 100 mg via ORAL
  Filled 2017-03-27: qty 1

## 2017-03-27 MED ORDER — ALBUTEROL SULFATE HFA 108 (90 BASE) MCG/ACT IN AERS: 1.0000 | INHALATION_SPRAY | RESPIRATORY_TRACT | 1 refills | Status: DC | PRN

## 2017-03-27 MED ORDER — ALBUTEROL SULFATE (2.5 MG/3ML) 0.083% IN NEBU
2.5000 mg | INHALATION_SOLUTION | Freq: Once | RESPIRATORY_TRACT | Status: AC
  Administered 2017-03-27: 2.5 mg via RESPIRATORY_TRACT
  Filled 2017-03-27: qty 3

## 2017-03-27 MED ORDER — AMOXICILLIN-POT CLAVULANATE 875-125 MG PO TABS: 1.0000 | ORAL_TABLET | Freq: Two times a day (BID) | ORAL | 0 refills | Status: DC

## 2017-03-27 MED ORDER — DEXTROSE 5 % IV SOLN
2.0000 g | INTRAVENOUS | Status: DC
  Administered 2017-03-27 – 2017-03-28 (×2): 2 g via INTRAVENOUS
  Filled 2017-03-27 (×2): qty 2

## 2017-03-27 MED ORDER — CALCIUM CARBONATE ANTACID 500 MG PO CHEW: 2.0000 | CHEWABLE_TABLET | ORAL | Status: DC | PRN

## 2017-03-27 MED ORDER — PRENATAL MULTIVITAMIN CH
1.0000 | ORAL_TABLET | Freq: Every day | ORAL | Status: DC
  Administered 2017-03-28: 1 via ORAL
  Filled 2017-03-27: qty 1

## 2017-03-27 MED ORDER — ALBUTEROL SULFATE (2.5 MG/3ML) 0.083% IN NEBU
3.0000 mL | INHALATION_SOLUTION | Freq: Four times a day (QID) | RESPIRATORY_TRACT | Status: DC
  Administered 2017-03-28 – 2017-03-29 (×5): 3 mL via RESPIRATORY_TRACT
  Filled 2017-03-27 (×5): qty 3

## 2017-03-27 MED ORDER — ACETAMINOPHEN 325 MG PO TABS: 650.0000 mg | ORAL_TABLET | ORAL | Status: DC | PRN

## 2017-03-27 NOTE — MAU Provider Note (Signed)
Chief Complaint: No chief complaint on file.   First Provider Initiated Contact with Patient 03/27/17 2037     SUBJECTIVE HPI: Peggy [name] is a 26 y.o. G3P2002 at [redacted]w[redacted]d who presents to Maternity Admissions reporting worsening SOB. Was seen in MAU last night for same. Had a cold that was improving, but then SOB and cough worsened. Caused Asthma exacerbation. Using Albuterol inhaler and using daughters Albuterol Nebs w./ only brief relief of SOB. In MAU last night she was Rx Prednisone and Augmentin. Took one dose of Prednisone, but not Augmentin due to large pill.   Modifying factors: See above Associated signs and symptoms: Pos for fatigue, , wheezing, productive cough and occasional vomiting. Neg for fever, chills, chest pain, leg swelling, hemoptysis.   Past Medical History:  Diagnosis Date  . Asthma   . Preterm labor    PTL with first preg  . Pyloric stenosis    OB History  Gravida Para Term Preterm AB Living  3 2 2  0 0 2  SAB TAB Ectopic Multiple Live Births  0 0 0 0 2    # Outcome Date GA Lbr Len/2nd Weight Sex Delivery Anes PTL Lv  3 Current           2 Term 12/10/14 [redacted]w[redacted]d 17:53 / 00:13 6 lb 2.6 oz (2.795 kg) F Vag-Spont EPI  LIV  1 Term 07/10/12 [redacted]w[redacted]d 26:17 6 lb 2.4 oz (2.79 kg) F Vag-Spont EPI  LIV     Past Surgical History:  Procedure Laterality Date  . PYLOROMYOTOMY     Social History   Social History  . Marital status: Married    Spouse name: N/A  . Number of children: N/A  . Years of education: N/A   Occupational History  . Not on file.   Social History Main Topics  . Smoking status: Current Every Day Smoker    Packs/day: 0.50    Years: 5.00    Types: Cigarettes  . Smokeless tobacco: Never Used     Comment: 4 cig/day  . Alcohol use No  . Drug use: No  . Sexual activity: Yes    Birth control/ protection: Condom   Other Topics Concern  . Not on file   Social History Narrative  . No narrative on file   Family History  Problem Relation  Age of Onset  . Arthritis Mother   . Cancer Mother     thyroid  . Depression Mother   . Hyperlipidemia Father   . Asthma Father   . Hypertension Father   . Heart disease Father   . COPD Maternal Grandfather   . Diabetes Paternal Grandmother   . Arthritis Paternal Grandmother   . Asthma Paternal Grandmother   . Heart disease Paternal Grandmother   . Depression Paternal Grandmother   . Hypertension Paternal Grandmother   . Hyperlipidemia Paternal Grandmother   . Other Neg Hx    No current facility-administered medications on file prior to encounter.    Current Outpatient Prescriptions on File Prior to Encounter  Medication Sig Dispense Refill  . acetaminophen (TYLENOL) 325 MG tablet Take 650 mg by mouth every 6 (six) hours as needed for mild pain or headache.    . albuterol (PROVENTIL HFA;VENTOLIN HFA) 108 (90 Base) MCG/ACT inhaler Inhale 2 puffs into the lungs every 6 (six) hours as needed for wheezing or shortness of breath. 18 g PRN  . albuterol (PROVENTIL HFA;VENTOLIN HFA) 108 (90 Base) MCG/ACT inhaler Inhale 1-2 puffs into the lungs every  4 (four) hours as needed for wheezing or shortness of breath. 1 Inhaler 1  . amoxicillin-clavulanate (AUGMENTIN) 875-125 MG tablet Take 1 tablet by mouth every 12 (twelve) hours. 14 tablet 0  . Doxylamine-Pyridoxine (DICLEGIS) 10-10 MG TBEC Take 1 tablet with breakfast and lunch.  Take 2 tablets at bedtime. 100 tablet 4  . metroNIDAZOLE (FLAGYL) 500 MG tablet Take 1 tablet (500 mg total) by mouth 2 (two) times daily. (Patient not taking: Reported on 02/06/2017) 14 tablet 0  . predniSONE (DELTASONE) 20 MG tablet Take 2 tablets (40 mg total) by mouth daily. 20 tablet 0  . Prenatal Vit-Fe Phos-FA-Omega (VITAFOL GUMMIES) 3.33-0.333-34.8 MG CHEW Chew 3 tablets by mouth at bedtime. 90 tablet 12  . promethazine (PHENERGAN) 25 MG tablet Take 1 tablet (25 mg total) by mouth every 6 (six) hours as needed for nausea or vomiting. (Patient not taking: Reported on  03/03/2017) 30 tablet 0  . ranitidine (ZANTAC) 150 MG tablet Take 1 tablet (150 mg total) by mouth 2 (two) times daily. (Patient not taking: Reported on 03/03/2017) 60 tablet 5  . Vitamin D, Ergocalciferol, (DRISDOL) 50000 units CAPS capsule Take 1 capsule (50,000 Units total) by mouth every 7 (seven) days. 30 capsule 2   No Known Allergies  I have reviewed patient's Past Medical Hx, Surgical Hx, Family Hx, Social Hx, medications and allergies.   Review of Systems  Constitutional: Positive for fatigue. Negative for chills and fever.  HENT: Positive for congestion. Negative for rhinorrhea and sore throat.   Respiratory: Positive for cough, chest tightness, shortness of breath and wheezing.   Cardiovascular: Negative for chest pain and leg swelling.  Gastrointestinal: Positive for nausea and vomiting. Negative for abdominal pain and diarrhea.  Genitourinary: Negative for vaginal bleeding.  Musculoskeletal: Negative for myalgias.    OBJECTIVE Patient Vitals for the past 24 hrs:  BP Temp Temp src Pulse Resp SpO2 Height Weight  03/27/17 1928 117/76 98.9 F (37.2 C) Oral (!) 103 20 98 % 5' (1.524 m) 156 lb 4 oz (70.9 kg)   Constitutional: Well-developed, well-nourished female in mild distress. Mildly ill-appearing. No cyanosis. Cardiovascular: mild tachycardia. No M/R/G. Respiratory: normal rate. Mildly increased effort. Wheezing and Rhonchi throughout all lung fields. Does not clear w/ cough. No egophony.  GI: Abd soft, non-tender, gravid appropriate for gestational age.  MS: Extremities nontender, no edema, normal ROM Neurologic: Alert and oriented x 4.  GU: Deferred  FHR 162 by doppler  LAB RESULTS Results for orders placed or performed during the hospital encounter of 03/27/17 (from the past 24 hour(s))  CBC with Differential/Platelet     Status: Abnormal   Collection Time: 03/27/17  8:28 PM  Result Value Ref Range   WBC 15.9 (H) 4.0 - 10.5 K/uL   RBC 4.13 3.87 - 5.11 MIL/uL    Hemoglobin 12.3 12.0 - 15.0 g/dL   HCT 35.8 (L) 36.0 - 46.0 %   MCV 86.7 78.0 - 100.0 fL   MCH 29.8 26.0 - 34.0 pg   MCHC 34.4 30.0 - 36.0 g/dL   RDW 13.2 11.5 - 15.5 %   Platelets 412 (H) 150 - 400 K/uL   Neutrophils Relative % 88 %   Neutro Abs 13.8 (H) 1.7 - 7.7 K/uL   Lymphocytes Relative 9 %   Lymphs Abs 1.5 0.7 - 4.0 K/uL   Monocytes Relative 2 %   Monocytes Absolute 0.4 0.1 - 1.0 K/uL   Eosinophils Relative 1 %   Eosinophils Absolute 0.2 0.0 - 0.7  K/uL   Basophils Relative 0 %   Basophils Absolute 0.0 0.0 - 0.1 K/uL    IMAGING Dg Chest 2 View  Result Date: 03/27/2017 CLINICAL DATA:  Shortness of breath. Eighteen weeks pregnant. History of asthma. EXAM: CHEST  2 VIEW COMPARISON:  Chest radiograph June 15, 2016 FINDINGS: RIGHT perihilar airspace opacity, localized to RIGHT upper lobe on lateral radiograph. No pleural effusion. Cardiac silhouette is normal. No pneumothorax. Soft tissue planes and included osseous structures are normal. IMPRESSION: RIGHT upper lobe atelectasis and possible pneumonia. Electronically Signed   By: Elon Alas M.D.   On: 03/27/2017 21:27    MAU COURSE Orders Placed This Encounter  Procedures  . DG Chest 2 View  . CBC with Differential/Platelet   Albuterol nebulizer Tx--Mild improvement in SOB.   MDM - Pneumonia and asthma exacerbation in pregnancy. Will admit for IV ABX.   ASSESSMENT 1. Pneumonia of right upper lobe due to infectious organism ([place])   2. SOB (shortness of breath)   3. Asthma     PLAN Admit to High Risk OB per consult w/ Dr. Elly Modena Zithromax, Rocephin IV, Albuterol Nebs PRN.   [place], CNM 03/27/2017  10:45 PM

## 2017-03-27 NOTE — Discharge Instructions (Signed)

## 2017-03-27 NOTE — MAU Note (Addendum)
PT  SAYS  SHE WAS HERE LAST  NIGHT  FOR SOB - TIGHT  CHEST - GAVE BREATHING  [place]    .  [facility]- WITH   DR  Fanny Bien     SAYS  SENT  HOME  ON MEDS - AMOX  AND PREDISONE-  STARTED   TODAY

## 2017-03-28 ENCOUNTER — Encounter (HOSPITAL_COMMUNITY): Payer: Self-pay | Admitting: *Deleted

## 2017-03-28 DIAGNOSIS — J45909 Unspecified asthma, uncomplicated: Secondary | ICD-10-CM | POA: Diagnosis present

## 2017-03-28 DIAGNOSIS — J45901 Unspecified asthma with (acute) exacerbation: Secondary | ICD-10-CM

## 2017-03-28 DIAGNOSIS — O99519 Diseases of the respiratory system complicating pregnancy, unspecified trimester: Secondary | ICD-10-CM

## 2017-03-28 LAB — TYPE AND SCREEN
ABO/RH(D): A POS
Antibody Screen: NEGATIVE

## 2017-03-28 MED ORDER — ZOLPIDEM TARTRATE 5 MG PO TABS
5.0000 mg | ORAL_TABLET | Freq: Every evening | ORAL | Status: DC | PRN
  Administered 2017-03-28 (×2): 5 mg via ORAL
  Filled 2017-03-28 (×2): qty 1

## 2017-03-28 MED ORDER — GUAIFENESIN ER 600 MG PO TB12
600.0000 mg | ORAL_TABLET | Freq: Two times a day (BID) | ORAL | Status: DC
  Administered 2017-03-28 (×2): 600 mg via ORAL
  Filled 2017-03-28 (×3): qty 1

## 2017-03-28 NOTE — H&P (Signed)
SUBJECTIVE HPI: Peggy [name] is a 26 y.o. G3P2002 at [redacted]w[redacted]d who presents to Maternity Admissions reporting worsening SOB. Was seen in MAU last night for same. Had a cold that was improving, but then SOB and cough worsened. Caused Asthma exacerbation. Using Albuterol inhaler and using daughters Albuterol Nebs w./ only brief relief of SOB. In MAU last night she was Rx Prednisone and Augmentin. Took one dose of Prednisone, but not Augmentin due to large pill.   Modifying factors: See above Associated signs and symptoms: Pos for fatigue, , wheezing, productive cough and occasional vomiting. Neg for fever, chills, chest pain, leg swelling, hemoptysis.       Past Medical History:  Diagnosis Date  . Asthma   . Preterm labor    PTL with first preg  . Pyloric stenosis                    OB History  Gravida Para Term Preterm AB Living  3 2 2  0 0 2  SAB TAB Ectopic Multiple Live Births  0 0 0 0 2    # Outcome Date GA Lbr Len/2nd Weight Sex Delivery Anes PTL Lv  3 Current           2 Term 12/10/14 [redacted]w[redacted]d 17:53 / 00:13 6 lb 2.6 oz (2.795 kg) F Vag-Spont EPI  LIV  1 Term 07/10/12 [redacted]w[redacted]d 26:17 6 lb 2.4 oz (2.79 kg) F Vag-Spont EPI  LIV          Past Surgical History:  Procedure Laterality Date  . PYLOROMYOTOMY     Social History        Social History  . Marital status: Married    Spouse name: N/A  . Number of children: N/A  . Years of education: N/A      Occupational History  . Not on file.         Social History Main Topics  . Smoking status: Current Every Day Smoker    Packs/day: 0.50    Years: 5.00    Types: Cigarettes  . Smokeless tobacco: Never Used     Comment: 4 cig/day  . Alcohol use No  . Drug use: No  . Sexual activity: Yes    Birth control/ protection: Condom       Other Topics Concern  . Not on file      Social History Narrative  . No narrative on file         Family History  Problem Relation Age of Onset   . Arthritis Mother   . Cancer Mother     thyroid  . Depression Mother   . Hyperlipidemia Father   . Asthma Father   . Hypertension Father   . Heart disease Father   . COPD Maternal Grandfather   . Diabetes Paternal Grandmother   . Arthritis Paternal Grandmother   . Asthma Paternal Grandmother   . Heart disease Paternal Grandmother   . Depression Paternal Grandmother   . Hypertension Paternal Grandmother   . Hyperlipidemia Paternal Grandmother   . Other Neg Hx    No current facility-administered medications on file prior to encounter.          Current Outpatient Prescriptions on File Prior to Encounter  Medication Sig Dispense Refill  . acetaminophen (TYLENOL) 325 MG tablet Take 650 mg by mouth every 6 (six) hours as needed for mild pain or headache.    . albuterol (PROVENTIL HFA;VENTOLIN HFA) 108 (90 Base) MCG/ACT inhaler Inhale 2  puffs into the lungs every 6 (six) hours as needed for wheezing or shortness of breath. 18 g PRN  . albuterol (PROVENTIL HFA;VENTOLIN HFA) 108 (90 Base) MCG/ACT inhaler Inhale 1-2 puffs into the lungs every 4 (four) hours as needed for wheezing or shortness of breath. 1 Inhaler 1  . amoxicillin-clavulanate (AUGMENTIN) 875-125 MG tablet Take 1 tablet by mouth every 12 (twelve) hours. 14 tablet 0  . Doxylamine-Pyridoxine (DICLEGIS) 10-10 MG TBEC Take 1 tablet with breakfast and lunch.  Take 2 tablets at bedtime. 100 tablet 4  . metroNIDAZOLE (FLAGYL) 500 MG tablet Take 1 tablet (500 mg total) by mouth 2 (two) times daily. (Patient not taking: Reported on 02/06/2017) 14 tablet 0  . predniSONE (DELTASONE) 20 MG tablet Take 2 tablets (40 mg total) by mouth daily. 20 tablet 0  . Prenatal Vit-Fe Phos-FA-Omega (VITAFOL GUMMIES) 3.33-0.333-34.8 MG CHEW Chew 3 tablets by mouth at bedtime. 90 tablet 12  . promethazine (PHENERGAN) 25 MG tablet Take 1 tablet (25 mg total) by mouth every 6 (six) hours as needed for nausea or vomiting. (Patient not  taking: Reported on 03/03/2017) 30 tablet 0  . ranitidine (ZANTAC) 150 MG tablet Take 1 tablet (150 mg total) by mouth 2 (two) times daily. (Patient not taking: Reported on 03/03/2017) 60 tablet 5  . Vitamin D, Ergocalciferol, (DRISDOL) 50000 units CAPS capsule Take 1 capsule (50,000 Units total) by mouth every 7 (seven) days. 30 capsule 2   No Known Allergies  I have reviewed patient's Past Medical Hx, Surgical Hx, Family Hx, Social Hx, medications and allergies.   Review of Systems  Constitutional: Positive for fatigue. Negative for chills and fever.  HENT: Positive for congestion. Negative for rhinorrhea and sore throat.   Respiratory: Positive for cough, chest tightness, shortness of breath and wheezing.   Cardiovascular: Negative for chest pain and leg swelling.  Gastrointestinal: Positive for nausea and vomiting. Negative for abdominal pain and diarrhea.  Genitourinary: Negative for vaginal bleeding.  Musculoskeletal: Negative for myalgias.    OBJECTIVE Patient Vitals for the past 24 hrs:  BP Temp Temp src Pulse Resp SpO2 Height Weight  03/27/17 1928 117/76 98.9 F (37.2 C) Oral (!) 103 20 98 % 5' (1.524 m) 156 lb 4 oz (70.9 kg)   Constitutional: Well-developed, well-nourished female in mild distress. Mildly ill-appearing. No cyanosis. Cardiovascular: mild tachycardia. No M/R/G. Respiratory: normal rate. Mildly increased effort. Wheezing and Rhonchi throughout all lung fields. Does not clear w/ cough. No egophony.  GI: Abd soft, non-tender, gravid appropriate for gestational age.  MS: Extremities nontender, no edema, normal ROM Neurologic: Alert and oriented x 4.  GU: Deferred  FHR 162 by doppler  LAB RESULTS      Results for orders placed or performed during the hospital encounter of 03/27/17 (from the past 24 hour(s))  CBC with Differential/Platelet     Status: Abnormal   Collection Time: 03/27/17  8:28 PM  Result Value Ref Range   WBC 15.9 (H) 4.0 - 10.5 K/uL    RBC 4.13 3.87 - 5.11 MIL/uL   Hemoglobin 12.3 12.0 - 15.0 g/dL   HCT 35.8 (L) 36.0 - 46.0 %   MCV 86.7 78.0 - 100.0 fL   MCH 29.8 26.0 - 34.0 pg   MCHC 34.4 30.0 - 36.0 g/dL   RDW 13.2 11.5 - 15.5 %   Platelets 412 (H) 150 - 400 K/uL   Neutrophils Relative % 88 %   Neutro Abs 13.8 (H) 1.7 - 7.7 K/uL  Lymphocytes Relative 9 %   Lymphs Abs 1.5 0.7 - 4.0 K/uL   Monocytes Relative 2 %   Monocytes Absolute 0.4 0.1 - 1.0 K/uL   Eosinophils Relative 1 %   Eosinophils Absolute 0.2 0.0 - 0.7 K/uL   Basophils Relative 0 %   Basophils Absolute 0.0 0.0 - 0.1 K/uL    IMAGING Dg Chest 2 View  Result Date: 03/27/2017 CLINICAL DATA:  Shortness of breath. Eighteen weeks pregnant. History of asthma. EXAM: CHEST  2 VIEW COMPARISON:  Chest radiograph June 15, 2016 FINDINGS: RIGHT perihilar airspace opacity, localized to RIGHT upper lobe on lateral radiograph. No pleural effusion. Cardiac silhouette is normal. No pneumothorax. Soft tissue planes and included osseous structures are normal. IMPRESSION: RIGHT upper lobe atelectasis and possible pneumonia. Electronically Signed   By: Elon Alas M.D.   On: 03/27/2017 21:27    MAU COURSE    Orders Placed This Encounter  Procedures  . DG Chest 2 View  . CBC with Differential/Platelet   Albuterol nebulizer Tx--Mild improvement in SOB.   MDM - Pneumonia and asthma exacerbation in pregnancy. Will admit for IV ABX.   ASSESSMENT 1. Pneumonia of right upper lobe due to infectious organism ([place])   2. SOB (shortness of breath)   3. Asthma     PLAN Admit to High Risk OB for pneumonia Zithromax, Rocephin IV, Albuterol Nebs PRN.   [place], CNM 03/27/2017  10:45 PM

## 2017-03-28 NOTE — Progress Notes (Signed)
Patient ID: Peggy [name], female   DOB: Jan 22, 1991, 26 y.o.   MRN: [id number] FACULTY PRACTICE ANTEPARTUM(COMPREHENSIVE) NOTE  CLEONA DOUBLEDAY is a 26 y.o. G3P2002 at [redacted]w[redacted]d  who is admitted for pneumonia and asthma exacerbation.    Fetal presentation is unsure. Length of Stay:  1  Days  Date of admission:03/27/2017  Subjective: Patient reports improvement in her breathing since admission Patient reports the fetal movement as active. Patient reports uterine contraction  activity as none. Patient reports  vaginal bleeding as none. Patient describes fluid per vagina as None.  Vitals:  Blood pressure (!) 108/48, pulse 97, temperature 98.1 F (36.7 C), temperature source Oral, resp. rate 16, height 5' (1.524 m), weight 156 lb 4 oz (70.9 kg), last menstrual period 11/10/2016, SpO2 94 %, unknown if currently breastfeeding. Vitals:   03/27/17 1928 03/27/17 2302 03/28/17 0309 03/28/17 0425  BP: 117/76 118/66 (!) 108/48   Pulse: (!) 103 91 97   Resp: 20 16 16    Temp: 98.9 F (37.2 C) 98.6 F (37 C) 98.1 F (36.7 C)   TempSrc: Oral Oral Oral   SpO2: 98% 97% 94% 94%  Weight: 156 lb 4 oz (70.9 kg)     Height: 5' (1.524 m)      Physical Examination:  General appearance - alert, well appearing, and in no distress Chest - bilateral wheezes with decreased breath sounds on right Heart - normal rate and regular rhythm Abdomen - soft, nontender, nondistended, no masses or organomegaly Pelvic Exam:  examination not indicated Cervical Exam: Not evaluated.  Extremities: extremities normal, atraumatic, no cyanosis or edema with DTRs 2+ bilaterally Membranes:intact  Fetal Monitoring:  Fetal doppler 146  Labs:  Results for orders placed or performed during the hospital encounter of 03/27/17 (from the past 24 hour(s))  CBC with Differential/Platelet   Collection Time: 03/27/17  8:28 PM  Result Value Ref Range   WBC 15.9 (H) 4.0 - 10.5 K/uL   RBC 4.13 3.87 - 5.11 MIL/uL   Hemoglobin  12.3 12.0 - 15.0 g/dL   HCT 35.8 (L) 36.0 - 46.0 %   MCV 86.7 78.0 - 100.0 fL   MCH 29.8 26.0 - 34.0 pg   MCHC 34.4 30.0 - 36.0 g/dL   RDW 13.2 11.5 - 15.5 %   Platelets 412 (H) 150 - 400 K/uL   Neutrophils Relative % 88 %   Neutro Abs 13.8 (H) 1.7 - 7.7 K/uL   Lymphocytes Relative 9 %   Lymphs Abs 1.5 0.7 - 4.0 K/uL   Monocytes Relative 2 %   Monocytes Absolute 0.4 0.1 - 1.0 K/uL   Eosinophils Relative 1 %   Eosinophils Absolute 0.2 0.0 - 0.7 K/uL   Basophils Relative 0 %   Basophils Absolute 0.0 0.0 - 0.1 K/uL  Type and screen [place]   Collection Time: 03/27/17 11:26 PM  Result Value Ref Range   ABO/RH(D) A POS    Antibody Screen NEG    Sample Expiration 03/30/2017     Imaging Studies:    Currently EPIC will not allow sonographic studies to automatically populate into notes.  In the meantime, copy and paste results into note or free text. Dg Chest 2 View  Result Date: 03/27/2017 CLINICAL DATA:  Shortness of breath. Eighteen weeks pregnant. History of asthma. EXAM: CHEST  2 VIEW COMPARISON:  Chest radiograph June 15, 2016 FINDINGS: RIGHT perihilar airspace opacity, localized to RIGHT upper lobe on lateral radiograph. No pleural effusion. Cardiac silhouette is  normal. No pneumothorax. Soft tissue planes and included osseous structures are normal. IMPRESSION: RIGHT upper lobe atelectasis and possible pneumonia. Electronically Signed   By: Elon Alas M.D.   On: 03/27/2017 21:27    Medications:  Scheduled . albuterol  3 mL Inhalation Q6H  . azithromycin  500 mg Intravenous Q24H  . cefTRIAXone (ROCEPHIN)  IV  2 g Intravenous Q24H  . docusate sodium  100 mg Oral Daily  . prenatal multivitamin  1 tablet Oral Q1200   I have reviewed the patient's current medications.  ASSESSMENT: [id number] [redacted]w[redacted]d Estimated Date of Delivery: 08/23/17  Patient Active Problem List   Diagnosis Date Noted  . Pneumonia affecting pregnancy, antepartum 03/27/2017  . Low  vitamin D level 02/12/2017  . Supervision of normal pregnancy, antepartum 02/06/2017  . Smoker 07/25/2014    PLAN: 26 yo G3P2 at [redacted]w[redacted]d with CAP - Continue IV antibiotics - Continue albuterol nebulizer - Continue close monitoring Routine antepartum care  Tia Gelb 03/28/2017,7:03 AM

## 2017-03-29 DIAGNOSIS — J189 Pneumonia, unspecified organism: Secondary | ICD-10-CM

## 2017-03-29 DIAGNOSIS — O99519 Diseases of the respiratory system complicating pregnancy, unspecified trimester: Secondary | ICD-10-CM

## 2017-03-29 MED ORDER — AZITHROMYCIN 250 MG PO TABS: ORAL_TABLET | ORAL | 0 refills | Status: DC

## 2017-03-29 MED ORDER — AMOXICILLIN-POT CLAVULANATE 875-125 MG PO TABS: 1.0000 | ORAL_TABLET | Freq: Two times a day (BID) | ORAL | 0 refills | Status: DC

## 2017-03-29 MED ORDER — ALBUTEROL SULFATE (2.5 MG/3ML) 0.083% IN NEBU: 2.5000 mg | INHALATION_SOLUTION | Freq: Four times a day (QID) | RESPIRATORY_TRACT | 12 refills | Status: DC | PRN

## 2017-03-29 MED ORDER — DM-GUAIFENESIN ER 30-600 MG PO TB12: 1.0000 | ORAL_TABLET | Freq: Two times a day (BID) | ORAL | 2 refills | Status: DC

## 2017-03-29 NOTE — Discharge Summary (Addendum)
Physician Discharge Summary  Patient ID: [name] MRN: [id number] DOB/AGE: 1991-03-29 26 y.o.  Admit date: 03/27/2017 Discharge date: 03/29/2017  Admission Diagnoses:ASSESSMENT 1. Pneumonia of right upper lobe due to infectious organism ([place])   2. SOB (shortness of breath)   3. Asthma     Discharge Diagnoses:  Principal Problem:   Pneumonia affecting pregnancy, antepartum Active Problems:   Asthma with acute exacerbation   Asthma affecting pregnancy, antepartum   Discharged Condition: good  Hospital Course:  First Provider Initiated Contact with Patient 03/27/17 2037     SUBJECTIVE HPI: Peggy [name] is a 26 y.o. G3P2002 at [redacted]w[redacted]d who presents to Maternity Admissions reporting worsening SOB. Was seen in MAU last night for same. Had a cold that was improving, but then SOB and cough worsened. Caused Asthma exacerbation. Using Albuterol inhaler and using daughters Albuterol Nebs w./ only brief relief of SOB. In MAU last night she was Rx Prednisone and Augmentin. Took one dose of Prednisone, but not Augmentin due to large pill.   Modifying factors: See above Associated signs and symptoms: Pos for fatigue, , wheezing, productive cough and occasional vomiting. Neg for fever, chills, chest pain, leg swelling, hemoptysis.   Consults: None  Significant Diagnostic Studies: labs:  CBC Latest Ref Rng & Units 03/27/2017 02/06/2017 12/12/2016  WBC 4.0 - 10.5 K/uL 15.9(H) 9.5 8.2  Hemoglobin 12.0 - 15.0 g/dL 12.3 - 13.2  Hematocrit 36.0 - 46.0 % 35.8(L) 37.7 39.4  Platelets 150 - 400 K/uL 412(H) 382(H) 372     Treatments: antibiotics: ceftriaxone, azithromycin and metronidazole  Discharge Exam: Blood pressure 108/61, pulse 94, temperature 98.4 F (36.9 C), temperature source Oral, resp. rate 16, height 5' (1.524 m), weight 70.9 kg (156 lb 4 oz), last menstrual period 11/10/2016, SpO2 99 %, unknown if currently breastfeeding. General appearance: alert, cooperative and no  distress Head: Normocephalic, without obvious abnormality, atraumatic Resp: clear to auscultation bilaterally and one single inspiratory wheeze RUL, cleared Extremities: extremities normal, atraumatic, no cyanosis or edema and Homans sign is negative, no sign of DVT  Disposition: 01-Home or Self Care Meds to continue : Augmentin, Azithromycin 500 x 4d, Flagyl for BV mucinex, albuterol  Discharge Instructions    Call MD for:  difficulty breathing, headache or visual disturbances    Complete by:  As directed    Call MD for:  temperature >100.4    Complete by:  As directed    Diet - low sodium heart healthy    Complete by:  As directed    Increase activity slowly    Complete by:  As directed       Follow-up Information    [facility] Follow up in 2 week(s).   Specialty:  Obstetrics and Gynecology Contact information: [address], [place] [phone]          Signed: Jonnie Kind 03/29/2017, 7:03 AM

## 2017-03-29 NOTE — Discharge Instructions (Signed)
Community-Acquired Pneumonia, Adult °Pneumonia is an infection of the lungs. One type of pneumonia can happen while a person is in a hospital. A different type can happen when a person is not in a hospital (community-acquired pneumonia). It is easy for this kind to spread from person to person. It can spread to you if you breathe near an infected person who coughs or sneezes. Some symptoms include: °· A dry cough. °· A wet (productive) cough. °· Fever. °· Sweating. °· Chest pain. °Follow these instructions at home: °· Take over-the-counter and prescription medicines only as told by your doctor. °¨ Only take cough medicine if you are losing sleep. °¨ If you were prescribed an antibiotic medicine, take it as told by your doctor. Do not stop taking the antibiotic even if you start to feel better. °· Sleep with your head and neck raised (elevated). You can do this by putting a few pillows under your head, or you can sleep in a recliner. °· Do not use tobacco products. These include cigarettes, chewing tobacco, and e-cigarettes. If you need help quitting, ask your doctor. °· Drink enough water to keep your pee (urine) clear or pale yellow. °A shot (vaccine) can help prevent pneumonia. Shots are often suggested for: °· People older than 24 years of age. °· People older than 24 years of age: °¨ Who are having cancer treatment. °¨ Who have long-term (chronic) lung disease. °¨ Who have problems with their body's defense system (immune system). °You may also prevent pneumonia if you take these actions: °· Get the flu (influenza) shot every year. °· Go to the dentist as often as told. °· Wash your hands often. If soap and water are not available, use hand sanitizer. °Contact a doctor if: °· You have a fever. °· You lose sleep because your cough medicine does not help. °Get help right away if: °· You are short of breath and it gets worse. °· You have more chest pain. °· Your sickness gets worse. This is very serious if: °¨ You  are an older adult. °¨ Your body's defense system is weak. °· You cough up blood. °This information is not intended to replace advice given to you by your health care provider. Make sure you discuss any questions you have with your health care provider. °Document Released: 06/03/2008 Document Revised: 05/23/2016 Document Reviewed: 04/12/2015 °Elsevier Interactive Patient Education © 2017 [employer] ° °

## 2017-03-29 NOTE — Progress Notes (Signed)
Discharge teaching and med education completed. Pt questions answered. Pt showered and left ambulating with significant other.

## 2017-03-31 ENCOUNTER — Other Ambulatory Visit: Payer: Self-pay | Admitting: Certified Nurse Midwife

## 2017-03-31 ENCOUNTER — Ambulatory Visit (HOSPITAL_COMMUNITY)
Admission: RE | Admit: 2017-03-31 | Discharge: 2017-03-31 | Disposition: A | Discharge status: Home / Self Care | Payer: Medicaid Other | Source: Ambulatory Visit | Attending: Certified Nurse Midwife | Admitting: Certified Nurse Midwife

## 2017-03-31 DIAGNOSIS — J45909 Unspecified asthma, uncomplicated: Secondary | ICD-10-CM | POA: Diagnosis not present

## 2017-03-31 DIAGNOSIS — Z3A19 19 weeks gestation of pregnancy: Secondary | ICD-10-CM | POA: Diagnosis not present

## 2017-03-31 DIAGNOSIS — Z363 Encounter for antenatal screening for malformations: Secondary | ICD-10-CM

## 2017-03-31 DIAGNOSIS — Z3490 Encounter for supervision of normal pregnancy, unspecified, unspecified trimester: Secondary | ICD-10-CM

## 2017-03-31 DIAGNOSIS — O352XX0 Maternal care for (suspected) hereditary disease in fetus, not applicable or unspecified: Secondary | ICD-10-CM | POA: Diagnosis not present

## 2017-03-31 DIAGNOSIS — O99212 Obesity complicating pregnancy, second trimester: Secondary | ICD-10-CM | POA: Insufficient documentation

## 2017-03-31 DIAGNOSIS — O9989 Other specified diseases and conditions complicating pregnancy, childbirth and the puerperium: Secondary | ICD-10-CM | POA: Insufficient documentation

## 2017-03-31 DIAGNOSIS — O09219 Supervision of pregnancy with history of pre-term labor, unspecified trimester: Secondary | ICD-10-CM | POA: Diagnosis not present

## 2017-04-03 ENCOUNTER — Ambulatory Visit (INDEPENDENT_AMBULATORY_CARE_PROVIDER_SITE_OTHER): Payer: Medicaid Other | Admitting: Certified Nurse Midwife

## 2017-04-03 ENCOUNTER — Encounter: Payer: Self-pay | Admitting: Certified Nurse Midwife

## 2017-04-03 ENCOUNTER — Other Ambulatory Visit: Payer: Self-pay | Admitting: Certified Nurse Midwife

## 2017-04-03 VITALS — BP 125/77 | HR 99 | Wt 161.6 lb

## 2017-04-03 DIAGNOSIS — J189 Pneumonia, unspecified organism: Secondary | ICD-10-CM

## 2017-04-03 DIAGNOSIS — Z3480 Encounter for supervision of other normal pregnancy, unspecified trimester: Secondary | ICD-10-CM

## 2017-04-03 DIAGNOSIS — O99519 Diseases of the respiratory system complicating pregnancy, unspecified trimester: Secondary | ICD-10-CM

## 2017-04-03 NOTE — Progress Notes (Signed)
   PRENATAL VISIT NOTE  Subjective:  Peggy [name] is a 26 y.o. G3P2002 at [redacted]w[redacted]d being seen today for ongoing prenatal care.  She is currently monitored for the following issues for this high-risk pregnancy and has Asthma with acute exacerbation; Smoker; Supervision of normal pregnancy, antepartum; Low vitamin D level; Pneumonia affecting pregnancy, antepartum; and Asthma affecting pregnancy, antepartum on her problem list.  Patient reports no complaints.  Contractions: Not present. Vag. Bleeding: None.  Movement: Present. Denies leaking of fluid.   The following portions of the patient's history were reviewed and updated as appropriate: allergies, current medications, past family history, past medical history, past social history, past surgical history and problem list. Problem list updated.  Objective:   Vitals:   04/03/17 0907  BP: 125/77  Pulse: 99  Weight: 161 lb 9.6 oz (73.3 kg)    Fetal Status: Fetal Heart Rate (bpm): 145   Movement: Present     General:  Alert, oriented and cooperative. Patient is in no acute distress.  Lungs Bilateral clear by auscultation,no sob noted   Skin: Skin is warm and dry. No rash noted.   Cardiovascular: Normal heart rate noted  Respiratory: Normal respiratory effort, no problems with respiration noted  Abdomen: Soft, gravid, appropriate for gestational age. Pain/Pressure: Absent     Pelvic:  Cervical exam deferred        Extremities: Normal range of motion.  Edema: Trace  Mental Status: Normal mood and affect. Normal behavior. Normal judgment and thought content.   Assessment and Plan:  Pregnancy: G3P2002 at [redacted]w[redacted]d  Pneumonia affecting pregnancy, antepartum  Supervision of other normal pregnancy, antepartum   1. Pneumonia affecting pregnancy, antepartum  Status post pneumonia completed azithromycin, encouraged to pick up pharmacy. Continue using albuterol as needed.    Preterm labor symptoms and general obstetric precautions  including but not limited to vaginal bleeding, contractions, leaking of fluid and fetal movement were reviewed in detail with the patient. Please refer to After Visit Summary for other counseling recommendations.  Followup-ROB -4wks  Morene Crocker, CNM

## 2017-04-03 NOTE — Progress Notes (Signed)
Patient has questions about her medications.

## 2017-05-05 ENCOUNTER — Ambulatory Visit (INDEPENDENT_AMBULATORY_CARE_PROVIDER_SITE_OTHER): Payer: Medicaid Other | Admitting: Obstetrics and Gynecology

## 2017-05-05 VITALS — BP 128/76 | HR 114 | Wt 160.0 lb

## 2017-05-05 DIAGNOSIS — J45909 Unspecified asthma, uncomplicated: Secondary | ICD-10-CM

## 2017-05-05 DIAGNOSIS — O99519 Diseases of the respiratory system complicating pregnancy, unspecified trimester: Secondary | ICD-10-CM

## 2017-05-05 DIAGNOSIS — Z3490 Encounter for supervision of normal pregnancy, unspecified, unspecified trimester: Secondary | ICD-10-CM

## 2017-05-05 NOTE — Progress Notes (Signed)
   PRENATAL VISIT NOTE  Subjective:  Peggy [name] is a 26 y.o. G3P2002 at [redacted]w[redacted]d being seen today for ongoing prenatal care.  She is currently monitored for the following issues for this low-risk pregnancy and has Asthma with acute exacerbation; Smoker; Supervision of normal pregnancy, antepartum; Low vitamin D level; Pneumonia affecting pregnancy, antepartum; and Asthma affecting pregnancy, antepartum on her problem list.  Patient reports no complaints.  Contractions: Not present. Vag. Bleeding: None.  Movement: Present. Denies leaking of fluid.   The following portions of the patient's history were reviewed and updated as appropriate: allergies, current medications, past family history, past medical history, past social history, past surgical history and problem list. Problem list updated.  Objective:   Vitals:   05/05/17 0954  BP: 128/76  Pulse: (!) 114  Weight: 160 lb (72.6 kg)    Fetal Status: Fetal Heart Rate (bpm): 149 Fundal Height: 24 cm Movement: Present     General:  Alert, oriented and cooperative. Patient is in no acute distress.  Skin: Skin is warm and dry. No rash noted.   Cardiovascular: Normal heart rate noted  Respiratory: Normal respiratory effort, no problems with respiration noted  Abdomen: Soft, gravid, appropriate for gestational age. Pain/Pressure: Present     Pelvic:  Cervical exam deferred        Extremities: Normal range of motion.  Edema: Trace  Mental Status: Normal mood and affect. Normal behavior. Normal judgment and thought content.   Assessment and Plan:  Pregnancy: G3P2002 at [redacted]w[redacted]d  1. Encounter for supervision of normal pregnancy, antepartum, unspecified gravidity Patient is doing well without complaints Third trimester labs and glucola next visit  2. Asthma affecting pregnancy, antepartum Continue albuterol prn  Preterm labor symptoms and general obstetric precautions including but not limited to vaginal bleeding, contractions, leaking  of fluid and fetal movement were reviewed in detail with the patient. Please refer to After Visit Summary for other counseling recommendations.  Return in about 4 weeks (around 06/02/2017) for ROB, 2 hr glucola next visit.   Lailee Hoelzel, Vickii Chafe, MD

## 2017-05-05 NOTE — Progress Notes (Signed)
128/76  

## 2017-05-06 ENCOUNTER — Encounter: Payer: Self-pay | Admitting: Emergency Medicine

## 2017-05-06 ENCOUNTER — Ambulatory Visit (INDEPENDENT_AMBULATORY_CARE_PROVIDER_SITE_OTHER): Payer: Medicaid Other | Admitting: Emergency Medicine

## 2017-05-06 DIAGNOSIS — O99519 Diseases of the respiratory system complicating pregnancy, unspecified trimester: Secondary | ICD-10-CM

## 2017-05-06 DIAGNOSIS — J45909 Unspecified asthma, uncomplicated: Secondary | ICD-10-CM | POA: Diagnosis not present

## 2017-05-06 MED ORDER — BUDESONIDE-FORMOTEROL FUMARATE 160-4.5 MCG/ACT IN AERO: 2.0000 | INHALATION_SPRAY | Freq: Two times a day (BID) | RESPIRATORY_TRACT | 0 refills | Status: DC

## 2017-05-06 NOTE — Progress Notes (Signed)
Subjective:    Patient ID: Peggy [name], female    DOB: [birth year], 26 y.o.   MRN: [id number]  HPI  26 year old woman, former minimal smoker (2.5 pack years), history of childhood asthma currently [redacted] weeks pregnant. She has been on inhaled meds all through school and into adulthood. She notes that she was using it once every few weeks. She has had increased albuterol use during her pregnancy. She has been treated for CAP x 2, most recently last month as 24 below. Had dyspnea, some non-productive cough. She notes that she has had approximately 5 flares over the year. Not currently on maintenance rx.   She was hospitalized in late March 2018 for suspected right upper lobe community-acquired pneumonia. A chest x-ray done at that time his been reviewed by me, this showed some right upper lobe atelectasis and a possible infiltrate. Not sure that this is significant different from the CT scan of her chest 06/04/16 that I have reviewed that showed some right upper lobe and right middle lobe atelectatic change. She was treated w steroids and abx.     Review of Systems  Constitutional: Positive for unexpected weight change. Negative for fever.  HENT: Positive for sneezing, sore throat and trouble swallowing. Negative for congestion, dental problem, ear pain, nosebleeds, postnasal drip, rhinorrhea and sinus pressure.   Eyes: Negative for redness and itching.  Respiratory: Positive for cough, chest tightness and shortness of breath. Negative for wheezing.   Cardiovascular: Negative for palpitations and leg swelling.  Gastrointestinal: Positive for abdominal pain. Negative for nausea and vomiting.  Genitourinary: Negative for dysuria.  Musculoskeletal: Negative for joint swelling.  Skin: Negative for rash.  Neurological: Positive for headaches.  Hematological: Does not bruise/bleed easily.  Psychiatric/Behavioral: Negative for dysphoric mood. The patient is not nervous/anxious.    Past Medical  History:  Diagnosis Date  . Asthma   . Preterm labor    PTL with first preg  . Pyloric stenosis      Family History  Problem Relation Age of Onset  . Arthritis Mother   . Cancer Mother     thyroid  . Depression Mother   . Hyperlipidemia Father   . Asthma Father   . Hypertension Father   . Heart disease Father   . COPD Maternal Grandfather   . Diabetes Paternal Grandmother   . Arthritis Paternal Grandmother   . Asthma Paternal Grandmother   . Heart disease Paternal Grandmother   . Depression Paternal Grandmother   . Hypertension Paternal Grandmother   . Hyperlipidemia Paternal Grandmother   . Other Neg Hx      Social History   Social History  . Marital status: Married    Spouse name: N/A  . Number of children: N/A  . Years of education: N/A   Occupational History  . Not on file.   Social History Main Topics  . Smoking status: Former Smoker    Packs/day: 0.50    Years: 5.00    Types: Cigarettes  . Smokeless tobacco: Never Used     Comment: 4 cig/day  . Alcohol use No  . Drug use: No  . Sexual activity: Yes    Birth control/ protection: Condom   Other Topics Concern  . Not on file   Social History Narrative  . No narrative on file  has worked [employer], some daycare work No [place] Has lived locally    No Known Allergies   Outpatient Medications Prior to Visit  Medication Sig Dispense Refill  .  acetaminophen (TYLENOL) 325 MG tablet Take 650 mg by mouth every 6 (six) hours as needed for mild pain or headache.    . albuterol (PROVENTIL HFA;VENTOLIN HFA) 108 (90 Base) MCG/ACT inhaler Inhale 1-2 puffs into the lungs every 4 (four) hours as needed for wheezing or shortness of breath. 1 Inhaler 1  . albuterol (PROVENTIL) (2.5 MG/3ML) 0.083% nebulizer solution Take 3 mLs (2.5 mg total) by nebulization every 6 (six) hours as needed for wheezing or shortness of breath. 75 mL 12  . dextromethorphan-guaiFENesin (MUCINEX DM) 30-600 MG 12hr tablet Take 1 tablet by  mouth 2 (two) times daily. 20 tablet 2  . Doxylamine-Pyridoxine (DICLEGIS) 10-10 MG TBEC Take 1 tablet with breakfast and lunch.  Take 2 tablets at bedtime. 100 tablet 4  . Prenatal Vit-Fe Phos-FA-Omega (VITAFOL GUMMIES) 3.33-0.333-34.8 MG CHEW Chew 3 tablets by mouth at bedtime. 90 tablet 12  . promethazine (PHENERGAN) 25 MG tablet Take 1 tablet (25 mg total) by mouth every 6 (six) hours as needed for nausea or vomiting. 30 tablet 0  . ranitidine (ZANTAC) 150 MG tablet Take 1 tablet (150 mg total) by mouth 2 (two) times daily. 60 tablet 5  . Vitamin D, Ergocalciferol, (DRISDOL) 50000 units CAPS capsule Take 1 capsule (50,000 Units total) by mouth every 7 (seven) days. 30 capsule 2  . amoxicillin-clavulanate (AUGMENTIN) 875-125 MG tablet Take 1 tablet by mouth every 12 (twelve) hours. 14 tablet 0  . azithromycin (ZITHROMAX Z-PAK) 250 MG tablet 1 p.o. Daily 4 each 0  . predniSONE (DELTASONE) 20 MG tablet Take 2 tablets (40 mg total) by mouth daily. 20 tablet 0   No facility-administered medications prior to visit.         Objective:   Physical Exam Vitals:   05/06/17 1145  BP: 118/60  Pulse: (!) 101  SpO2: 97%  Weight: 161 lb 3.2 oz (73.1 kg)  Height: 5\' 1"  (1.549 m)   Gen: Pleasant, well-nourished, in no distress,  normal affect  ENT: No lesions,  mouth clear,  oropharynx clear, no postnasal drip  Neck: No JVD, no TMG, no carotid bruits  Lungs: No use of accessory muscles, no wheeze, rales or rhonchi  Cardiovascular: RRR, heart sounds normal, no murmur or gallops, no peripheral edema  Musculoskeletal: No deformities, no cyanosis or clubbing  Neuro: alert, non focal  Skin: Warm, no lesions or rashes      Assessment & Plan:  Asthma affecting pregnancy, antepartum Clinical history all consistent with asthma. I suspect that her recent hospitalization was in acute exacerbation, not necessarily community-acquired pneumonia based on chronicity of right upper lobe changes on  chest x-ray and CT scan of the chest. Poor control at this time, frequent albuterol use. I suspect that this is multifactorial, most affected by her pregnancy. Also note that she has some gastroesophageal reflux and allergy symptoms. I believe she needs to be started on a controller medication, we will start Symbicort to avoid dry powder. Following one month to see if she is clinically responded, has better control.  Please start Symbicort 160/4.5 g, 2 puffs twice a day. Remember to gargle and rinse out mouth after using this medication. Keep albuterol available to use 2 puffs up to every 4 hours if needed for shortness of breath Start taking your pantoprazole 40 mg every day We may need to consider starting medication for nasal allergies if these worsen through pregnancy We will likely perform breathing tests at some point in the future to assess the degree of  your asthma. I would like to wait to do this until after your baby is delivered Follow with Dr Lamonte Sakai in 1 month or next available to review your status on the new medication.   Baltazar Apo, MD, PhD 05/06/2017, 12:24 PM [facility] [phone] or if no answer [phone]

## 2017-05-06 NOTE — Patient Instructions (Addendum)
Please start Symbicort 160/4.5 g, 2 puffs twice a day. Remember to gargle and rinse out mouth after using this medication. Keep albuterol available to use 2 puffs up to every 4 hours if needed for shortness of breath Start taking your pantoprazole 40 mg every day We may need to consider starting medication for nasal allergies if these worsen through pregnancy We will likely perform breathing tests at some point in the future to assess the degree of your asthma. I would like to wait to do this until after your baby is delivered Follow with Dr Lamonte Sakai in 1 month or next available to review your status on the new medication.

## 2017-05-06 NOTE — Assessment & Plan Note (Signed)
Clinical history all consistent with asthma. I suspect that her recent hospitalization was in acute exacerbation, not necessarily community-acquired pneumonia based on chronicity of right upper lobe changes on chest x-ray and CT scan of the chest. Poor control at this time, frequent albuterol use. I suspect that this is multifactorial, most affected by her pregnancy. Also note that she has some gastroesophageal reflux and allergy symptoms. I believe she needs to be started on a controller medication, we will start Symbicort to avoid dry powder. Following one month to see if she is clinically responded, has better control.  Please start Symbicort 160/4.5 g, 2 puffs twice a day. Remember to gargle and rinse out mouth after using this medication. Keep albuterol available to use 2 puffs up to every 4 hours if needed for shortness of breath Start taking your pantoprazole 40 mg every day We may need to consider starting medication for nasal allergies if these worsen through pregnancy We will likely perform breathing tests at some point in the future to assess the degree of your asthma. I would like to wait to do this until after your baby is delivered Follow with Dr Lamonte Sakai in 1 month or next available to review your status on the new medication.

## 2017-05-22 ENCOUNTER — Telehealth: Payer: Self-pay

## 2017-05-22 ENCOUNTER — Inpatient Hospital Stay (HOSPITAL_COMMUNITY)
Admission: AD | Admit: 2017-05-22 | Discharge: 2017-05-22 | Disposition: A | Discharge status: Home / Self Care | Payer: Medicaid Other | Source: Ambulatory Visit | Attending: Obstetrics and Gynecology | Admitting: Obstetrics and Gynecology

## 2017-05-22 ENCOUNTER — Encounter (HOSPITAL_COMMUNITY): Payer: Self-pay | Admitting: *Deleted

## 2017-05-22 ENCOUNTER — Inpatient Hospital Stay (HOSPITAL_COMMUNITY): Payer: Medicaid Other

## 2017-05-22 DIAGNOSIS — J4541 Moderate persistent asthma with (acute) exacerbation: Secondary | ICD-10-CM

## 2017-05-22 DIAGNOSIS — R062 Wheezing: Secondary | ICD-10-CM

## 2017-05-22 DIAGNOSIS — Z3A29 29 weeks gestation of pregnancy: Secondary | ICD-10-CM

## 2017-05-22 DIAGNOSIS — Z3A26 26 weeks gestation of pregnancy: Secondary | ICD-10-CM | POA: Diagnosis not present

## 2017-05-22 DIAGNOSIS — J45909 Unspecified asthma, uncomplicated: Secondary | ICD-10-CM

## 2017-05-22 DIAGNOSIS — Z3490 Encounter for supervision of normal pregnancy, unspecified, unspecified trimester: Secondary | ICD-10-CM

## 2017-05-22 DIAGNOSIS — O9989 Other specified diseases and conditions complicating pregnancy, childbirth and the puerperium: Secondary | ICD-10-CM

## 2017-05-22 DIAGNOSIS — J45901 Unspecified asthma with (acute) exacerbation: Secondary | ICD-10-CM | POA: Diagnosis not present

## 2017-05-22 DIAGNOSIS — O26892 Other specified pregnancy related conditions, second trimester: Secondary | ICD-10-CM | POA: Diagnosis present

## 2017-05-22 DIAGNOSIS — Z87891 Personal history of nicotine dependence: Secondary | ICD-10-CM | POA: Insufficient documentation

## 2017-05-22 DIAGNOSIS — O99519 Diseases of the respiratory system complicating pregnancy, unspecified trimester: Secondary | ICD-10-CM

## 2017-05-22 DIAGNOSIS — O99513 Diseases of the respiratory system complicating pregnancy, third trimester: Secondary | ICD-10-CM | POA: Diagnosis not present

## 2017-05-22 LAB — URINALYSIS, ROUTINE W REFLEX MICROSCOPIC
BILIRUBIN URINE: NEGATIVE
GLUCOSE, UA: NEGATIVE mg/dL
KETONES UR: NEGATIVE mg/dL
NITRITE: NEGATIVE
PROTEIN: NEGATIVE mg/dL
Specific Gravity, Urine: 1.004 — ABNORMAL LOW (ref 1.005–1.030)
pH: 6 (ref 5.0–8.0)

## 2017-05-22 LAB — CBC WITH DIFFERENTIAL/PLATELET
BASOS ABS: 0 10*3/uL (ref 0.0–0.1)
Basophils Relative: 0 %
EOS ABS: 0.8 10*3/uL — AB (ref 0.0–0.7)
EOS PCT: 6 %
HCT: 35.2 % — ABNORMAL LOW (ref 36.0–46.0)
Hemoglobin: 11.8 g/dL — ABNORMAL LOW (ref 12.0–15.0)
Lymphocytes Relative: 21 %
Lymphs Abs: 2.6 10*3/uL (ref 0.7–4.0)
MCH: 29.4 pg (ref 26.0–34.0)
MCHC: 33.5 g/dL (ref 30.0–36.0)
MCV: 87.6 fL (ref 78.0–100.0)
MONO ABS: 0.7 10*3/uL (ref 0.1–1.0)
Monocytes Relative: 6 %
Neutro Abs: 8.2 10*3/uL — ABNORMAL HIGH (ref 1.7–7.7)
Neutrophils Relative %: 67 %
Platelets: 316 10*3/uL (ref 150–400)
RBC: 4.02 MIL/uL (ref 3.87–5.11)
RDW: 13.2 % (ref 11.5–15.5)
WBC: 12.4 10*3/uL — AB (ref 4.0–10.5)

## 2017-05-22 MED ORDER — LACTATED RINGERS IV SOLN
INTRAVENOUS | Status: DC
  Administered 2017-05-22: 19:00:00 via INTRAVENOUS

## 2017-05-22 MED ORDER — METHYLPREDNISOLONE 4 MG PO TBPK: ORAL_TABLET | ORAL | 0 refills | Status: DC

## 2017-05-22 MED ORDER — BUDESONIDE 0.25 MG/2ML IN SUSP: 0.2500 mg | Freq: Two times a day (BID) | RESPIRATORY_TRACT | Status: DC

## 2017-05-22 MED ORDER — ALBUTEROL SULFATE (2.5 MG/3ML) 0.083% IN NEBU
2.5000 mg | INHALATION_SOLUTION | Freq: Once | RESPIRATORY_TRACT | Status: AC
  Administered 2017-05-22: 2.5 mg via RESPIRATORY_TRACT
  Filled 2017-05-22: qty 3

## 2017-05-22 MED ORDER — METHYLPREDNISOLONE SODIUM SUCC 125 MG IJ SOLR
60.0000 mg | Freq: Once | INTRAMUSCULAR | Status: AC
  Administered 2017-05-22: 60 mg via INTRAVENOUS
  Filled 2017-05-22: qty 2

## 2017-05-22 MED ORDER — BUDESONIDE 0.25 MG/2ML IN SUSP
0.2500 mg | Freq: Once | RESPIRATORY_TRACT | Status: AC
  Administered 2017-05-22: 0.25 mg via RESPIRATORY_TRACT
  Filled 2017-05-22: qty 2

## 2017-05-22 NOTE — MAU Provider Note (Addendum)
History     CSN: [id number]  Arrival date and time: 05/22/17 1811   First Provider Initiated Contact with Patient 05/22/17 1827      Chief Complaint  Patient presents with  . Abdominal Pain  . Cough  . Nasal Congestion  . Dizziness  . Vaginal Discharge   HPI 26 yo G3P2002 IUP 26 5/7 weeks presents to MAU with c/o wheezing and irregular ut ctx. Pt with known H/O asthma. Was hospitalized in March for CAP. Saw Pulmonary a few weeks ago and started on Symbicort. Started with URI sx a few days ago. Wheezing has worsen despite use of Albuterol and Symbicort. She denies any fever or chills. Appetite decreased as well as po intake.   Denies any VB or LOF or recent IC. H/O preterm labor with first pregnancy but had TSVD   Past Medical History:  Diagnosis Date  . Asthma   . Preterm labor    PTL with first preg  . Pyloric stenosis     Past Surgical History:  Procedure Laterality Date  . PYLOROMYOTOMY      Family History  Problem Relation Age of Onset  . Arthritis Mother   . Cancer Mother        thyroid  . Depression Mother   . Hyperlipidemia Father   . Asthma Father   . Hypertension Father   . Heart disease Father   . COPD Maternal Grandfather   . Diabetes Paternal Grandmother   . Arthritis Paternal Grandmother   . Asthma Paternal Grandmother   . Heart disease Paternal Grandmother   . Depression Paternal Grandmother   . Hypertension Paternal Grandmother   . Hyperlipidemia Paternal Grandmother   . Other Neg Hx     Social History  Substance Use Topics  . Smoking status: Former Smoker    Packs/day: 0.50    Years: 5.00    Types: Cigarettes  . Smokeless tobacco: Never Used     Comment: 4 cig/day  . Alcohol use No    Allergies: No Known Allergies  Prescriptions Prior to Admission  Medication Sig Dispense Refill Last Dose  . acetaminophen (TYLENOL) 325 MG tablet Take 650 mg by mouth every 6 (six) hours as needed for mild pain or headache.   Taking  . albuterol  (PROVENTIL HFA;VENTOLIN HFA) 108 (90 Base) MCG/ACT inhaler Inhale 1-2 puffs into the lungs every 4 (four) hours as needed for wheezing or shortness of breath. 1 Inhaler 1 Taking  . albuterol (PROVENTIL) (2.5 MG/3ML) 0.083% nebulizer solution Take 3 mLs (2.5 mg total) by nebulization every 6 (six) hours as needed for wheezing or shortness of breath. 75 mL 12 Taking  . budesonide-formoterol (SYMBICORT) 160-4.5 MCG/ACT inhaler Inhale 2 puffs into the lungs 2 (two) times daily. 3 Inhaler 0   . dextromethorphan-guaiFENesin (MUCINEX DM) 30-600 MG 12hr tablet Take 1 tablet by mouth 2 (two) times daily. 20 tablet 2 Taking  . Doxylamine-Pyridoxine (DICLEGIS) 10-10 MG TBEC Take 1 tablet with breakfast and lunch.  Take 2 tablets at bedtime. 100 tablet 4 Taking  . Prenatal Vit-Fe Phos-FA-Omega (VITAFOL GUMMIES) 3.33-0.333-34.8 MG CHEW Chew 3 tablets by mouth at bedtime. 90 tablet 12 Taking  . promethazine (PHENERGAN) 25 MG tablet Take 1 tablet (25 mg total) by mouth every 6 (six) hours as needed for nausea or vomiting. 30 tablet 0 Taking  . ranitidine (ZANTAC) 150 MG tablet Take 1 tablet (150 mg total) by mouth 2 (two) times daily. 60 tablet 5 Taking  . Vitamin D, Ergocalciferol, (  DRISDOL) 50000 units CAPS capsule Take 1 capsule (50,000 Units total) by mouth every 7 (seven) days. 30 capsule 2 Taking    Review of Systems  Constitutional: Positive for appetite change. Negative for fever.  HENT: Positive for congestion.   Respiratory: Positive for cough and wheezing.   Gastrointestinal: Negative.   Genitourinary: Negative.    Physical Exam   Blood pressure 134/77, pulse 90, temperature 98.2 F (36.8 C), temperature source Oral, resp. rate 18, last menstrual period 11/10/2016, SpO2 99 %, unknown if currently breastfeeding.  Physical Exam  Nursing note and vitals reviewed. Constitutional: She appears well-developed. No distress.  Mildly ill appear female but in NAD  HENT:  Head: Normocephalic.   Cardiovascular: Normal rate, regular rhythm and normal heart sounds.   Respiratory: She has wheezes.  GI: Soft. Bowel sounds are normal.  Genitourinary:  Genitourinary Comments: Cervix LTC  Neurological: She is alert.  Skin: Skin is warm and dry.  Psychiatric: She has a normal mood and affect.   FHT: 155, moderate with 15x15 accels, no decels Toco: no UCs   Results for orders placed or performed during the hospital encounter of 05/22/17 (from the past 24 hour(s))  Urinalysis, Routine w reflex microscopic     Status: Abnormal   Collection Time: 05/22/17  6:15 PM  Result Value Ref Range   Color, Urine STRAW (A) YELLOW   APPearance CLEAR CLEAR   Specific Gravity, Urine 1.004 (L) 1.005 - 1.030   pH 6.0 5.0 - 8.0   Glucose, UA NEGATIVE NEGATIVE mg/dL   Hgb urine dipstick SMALL (A) NEGATIVE   Bilirubin Urine NEGATIVE NEGATIVE   Ketones, ur NEGATIVE NEGATIVE mg/dL   Protein, ur NEGATIVE NEGATIVE mg/dL   Nitrite NEGATIVE NEGATIVE   Leukocytes, UA SMALL (A) NEGATIVE   RBC / HPF 0-5 0 - 5 RBC/hpf   WBC, UA 0-5 0 - 5 WBC/hpf   Bacteria, UA RARE (A) NONE SEEN   Squamous Epithelial / LPF 0-5 (A) NONE SEEN  CBC with Differential/Platelet     Status: Abnormal   Collection Time: 05/22/17  6:54 PM  Result Value Ref Range   WBC 12.4 (H) 4.0 - 10.5 K/uL   RBC 4.02 3.87 - 5.11 MIL/uL   Hemoglobin 11.8 (L) 12.0 - 15.0 g/dL   HCT 35.2 (L) 36.0 - 46.0 %   MCV 87.6 78.0 - 100.0 fL   MCH 29.4 26.0 - 34.0 pg   MCHC 33.5 30.0 - 36.0 g/dL   RDW 13.2 11.5 - 15.5 %   Platelets 316 150 - 400 K/uL   Neutrophils Relative % 67 %   Neutro Abs 8.2 (H) 1.7 - 7.7 K/uL   Lymphocytes Relative 21 %   Lymphs Abs 2.6 0.7 - 4.0 K/uL   Monocytes Relative 6 %   Monocytes Absolute 0.7 0.1 - 1.0 K/uL   Eosinophils Relative 6 %   Eosinophils Absolute 0.8 (H) 0.0 - 0.7 K/uL   Basophils Relative 0 %   Basophils Absolute 0.0 0.0 - 0.1 K/uL   Dg Chest 2 View  Result Date: 05/22/2017 CLINICAL DATA:  26 year old  pregnant female in the second trimester with wheezing. Illness 1 month ago without improvement. Denies fever. EXAM: CHEST  2 VIEW COMPARISON:  Chest radiographs 03/27/2017 and earlier, including chest CT 06/04/2016. FINDINGS: Chronic and recurrent atelectasis involving various lobes of the right lung are demonstrated on chest imaging since June 2017 (right upper lobe atelectasis at that time). Confluent atelectasis along the right minor fissure today which  probably mostly involves the right middle lobe has not improved since March. Lung parenchyma elsewhere appears stable and clear. Mediastinal contours remain within normal limits. Visualized tracheal air column is within normal limits. No pneumothorax or pleural effusion. No acute osseous abnormality identified. Negative visible bowel gas pattern. The patient abdomen was double shielded. IMPRESSION: 1. Chronic and recurrent right lung atelectasis since 2017. Currently, confluent right middle lobe atelectasis has not improved since March. Repeat chest CT evaluation to compare to that on 06/04/2016 is recommended but could be done on a routine outpatient basis unless indicated emergently. 2. Left lung and mediastinal contours remain normal. Electronically Signed   By: Genevie Ann M.D.   On: 05/22/2017 19:11   MAU Course  Procedures  MDM Patient has had breathing treatment, and solumedrol. Lungs are more clear at this time.  Will DC home on steroid taper and plan to FU with pulmonology.    Assessment and Plan   1. Moderate persistent asthma with exacerbation   2. Wheezing   3. [redacted] weeks gestation of pregnancy    DC home Comfort measures reviewed  1st/2nd/3rd Trimester precautions  Bleeding precautions Ectopic precautions PTL precautions  Fetal kick counts RX: medrol taper as directed  Return to MAU as needed FU with OB as planned    Follow-up Information     Pulmonary Care Follow up.   Specialty:  Pulmonology Contact information: [address] [address] [place] 05/22/2017, 6:53 PM

## 2017-05-22 NOTE — MAU Note (Signed)
Improved breath sounds after Pulmicort nebulizer. Slight inspiratory wheezes

## 2017-05-22 NOTE — Telephone Encounter (Signed)
Pt called stating that she is has a head cold, congestion, and coughing. States that her coughing is causing her to have contractions. She states that they are not consistent or painful, but has a hx of preterm labor and is feeling pressure. She is presently taking mucinex, and states that she had pneumonia last month. Pt advised to go to [facility] for evaluation due to her hx of preterm labor.

## 2017-05-22 NOTE — Telephone Encounter (Signed)
Thank you Peggy [name].

## 2017-05-22 NOTE — Discharge Instructions (Signed)

## 2017-05-22 NOTE — [facility]U Note (Signed)
Patient states was diagnosed with Pneumonia a couple of months ago and still experiencing same symptoms; states doesn't feel any better. Patient states has had to use inhaler all day.  +cough +congestion +dizziness Denies fever or chills  +abdominal cramping --started today, pain comes and goes, tried tylenol at 1pm today (helped a little per patient)  +mucus like vaginal discharge

## 2017-06-02 ENCOUNTER — Other Ambulatory Visit: Payer: Self-pay | Admitting: Advanced Practice Midwife

## 2017-06-02 ENCOUNTER — Other Ambulatory Visit: Payer: Medicaid Other

## 2017-06-02 ENCOUNTER — Other Ambulatory Visit: Payer: Self-pay | Admitting: Certified Nurse Midwife

## 2017-06-02 ENCOUNTER — Ambulatory Visit (INDEPENDENT_AMBULATORY_CARE_PROVIDER_SITE_OTHER): Payer: Medicaid Other | Admitting: Obstetrics & Gynecology

## 2017-06-02 VITALS — BP 131/78 | HR 110 | Wt 160.3 lb

## 2017-06-02 DIAGNOSIS — Z23 Encounter for immunization: Secondary | ICD-10-CM

## 2017-06-02 DIAGNOSIS — O219 Vomiting of pregnancy, unspecified: Secondary | ICD-10-CM

## 2017-06-02 DIAGNOSIS — Z3483 Encounter for supervision of other normal pregnancy, third trimester: Secondary | ICD-10-CM

## 2017-06-02 DIAGNOSIS — Z3480 Encounter for supervision of other normal pregnancy, unspecified trimester: Secondary | ICD-10-CM

## 2017-06-02 NOTE — Progress Notes (Signed)
TDAP given Left Deltoid. Patient tolerated well.

## 2017-06-02 NOTE — Progress Notes (Signed)
   PRENATAL VISIT NOTE  Subjective:  Peggy [name] is a 26 y.o. G3P2002 at [redacted]w[redacted]d being seen today for ongoing prenatal care.  She is currently monitored for the following issues for this high-risk pregnancy and has Asthma with acute exacerbation; Smoker; Supervision of normal pregnancy, antepartum; Low vitamin D level; Pneumonia affecting pregnancy, antepartum; and Asthma affecting pregnancy, antepartum on her problem list.  Patient reports asthma sx, using inhaler.  Contractions: Not present. Vag. Bleeding: None.  Movement: Present. Denies leaking of fluid.   The following portions of the patient's history were reviewed and updated as appropriate: allergies, current medications, past family history, past medical history, past social history, past surgical history and problem list. Problem list updated.  Objective:   Vitals:   06/02/17 0927  BP: 131/78  Pulse: (!) 110  Weight: 72.7 kg (160 lb 4.8 oz)    Fetal Status: Fetal Heart Rate (bpm): 150   Movement: Present     General:  Alert, oriented and cooperative. Patient is in no acute distress.  Skin: Skin is warm and dry. No rash noted.   Cardiovascular: Normal heart rate noted  Respiratory: Normal respiratory effort, no problems with respiration noted, CTA  Abdomen: Soft, gravid, appropriate for gestational age. Pain/Pressure: Present     Pelvic:  Cervical exam deferred        Extremities: Normal range of motion.  Edema: None  Mental Status: Normal mood and affect. Normal behavior. Normal judgment and thought content.   Assessment and Plan:  Pregnancy: G3P2002 at [redacted]w[redacted]d  1. Supervision of other normal pregnancy, antepartum To see pulmonology for asthma - Glucose Tolerance, 2 Hours w/1 Hour - CBC - HIV antibody (with reflex) - RPR  Preterm labor symptoms and general obstetric precautions including but not limited to vaginal bleeding, contractions, leaking of fluid and fetal movement were reviewed in detail with the  patient. Please refer to After Visit Summary for other counseling recommendations.  Return in about 2 weeks (around 06/16/2017).   Emeterio Reeve, MD

## 2017-06-02 NOTE — Patient Instructions (Signed)

## 2017-06-03 LAB — CBC
HEMATOCRIT: 34.9 % (ref 34.0–46.6)
Hemoglobin: 11.5 g/dL (ref 11.1–15.9)
MCH: 28.4 pg (ref 26.6–33.0)
MCHC: 33 g/dL (ref 31.5–35.7)
MCV: 86 fL (ref 79–97)
PLATELETS: 359 10*3/uL (ref 150–379)
RBC: 4.05 x10E6/uL (ref 3.77–5.28)
RDW: 13.1 % (ref 12.3–15.4)
WBC: 12.1 10*3/uL — AB (ref 3.4–10.8)

## 2017-06-03 LAB — GLUCOSE TOLERANCE, 2 HOURS W/ 1HR
GLUCOSE, FASTING: 111 mg/dL — AB (ref 65–91)
Glucose, 1 hour: 182 mg/dL — ABNORMAL HIGH (ref 65–179)
Glucose, 2 hour: 103 mg/dL (ref 65–152)

## 2017-06-03 LAB — RPR: RPR Ser Ql: NONREACTIVE

## 2017-06-03 LAB — HIV ANTIBODY (ROUTINE TESTING W REFLEX): HIV Screen 4th Generation wRfx: NONREACTIVE

## 2017-06-03 MED ORDER — PROMETHAZINE HCL 25 MG PO TABS: 25.0000 mg | ORAL_TABLET | Freq: Four times a day (QID) | ORAL | 2 refills | Status: DC | PRN

## 2017-06-04 ENCOUNTER — Telehealth: Payer: Self-pay

## 2017-06-04 ENCOUNTER — Other Ambulatory Visit: Payer: Self-pay

## 2017-06-04 DIAGNOSIS — O24410 Gestational diabetes mellitus in pregnancy, diet controlled: Secondary | ICD-10-CM

## 2017-06-04 MED ORDER — BLOOD GLUCOSE MONITORING SUPPL DEVI: 99 refills | Status: DC

## 2017-06-04 NOTE — Progress Notes (Signed)
See lab

## 2017-06-04 NOTE — Telephone Encounter (Signed)
Pt. Had called about her results. She was advised of the results and that her Glucose devices have been sent to her Pharmacy and [facility]. [facility] will call her for her appt. She is to take devices with her to visit, record reading and take to OB appt.

## 2017-06-06 ENCOUNTER — Ambulatory Visit: Payer: Medicaid Other | Admitting: Adult Health

## 2017-06-09 ENCOUNTER — Encounter: Payer: Self-pay | Admitting: *Deleted

## 2017-06-11 ENCOUNTER — Encounter: Payer: Medicaid Other | Attending: Obstetrics & Gynecology | Admitting: Registered"

## 2017-06-11 DIAGNOSIS — Z3A00 Weeks of gestation of pregnancy not specified: Secondary | ICD-10-CM | POA: Insufficient documentation

## 2017-06-11 DIAGNOSIS — Z713 Dietary counseling and surveillance: Secondary | ICD-10-CM | POA: Diagnosis not present

## 2017-06-11 DIAGNOSIS — O24410 Gestational diabetes mellitus in pregnancy, diet controlled: Secondary | ICD-10-CM | POA: Diagnosis not present

## 2017-06-11 DIAGNOSIS — R7309 Other abnormal glucose: Secondary | ICD-10-CM

## 2017-06-12 ENCOUNTER — Encounter: Payer: Self-pay | Admitting: Registered"

## 2017-06-12 NOTE — Progress Notes (Signed)
Patient was seen on 06/11/2017 for Gestational Diabetes self-management class at the [facility]. The following learning objectives were met by the patient during this course:   States the definition of Gestational Diabetes  States why dietary management is important in controlling blood glucose  Describes the effects each nutrient has on blood glucose levels  Demonstrates ability to create a balanced meal plan  Demonstrates carbohydrate counting   States when to check blood glucose levels  Demonstrates proper blood glucose monitoring techniques  States the effect of stress and exercise on blood glucose levels  States the importance of limiting caffeine and abstaining from alcohol and smoking  Blood glucose monitor given: none given Lot # n/a Exp: n/a Blood glucose reading: n/a  Patient instructed to monitor glucose levels: FBS: 60 - <90 1 hour: <140 2 hour: <120  Patient received handouts:  Nutrition Diabetes and Pregnancy  Carbohydrate Counting List  Patient will be seen for follow-up as needed.

## 2017-06-17 ENCOUNTER — Ambulatory Visit (INDEPENDENT_AMBULATORY_CARE_PROVIDER_SITE_OTHER): Payer: Medicaid Other | Admitting: Obstetrics & Gynecology

## 2017-06-17 DIAGNOSIS — O24919 Unspecified diabetes mellitus in pregnancy, unspecified trimester: Secondary | ICD-10-CM

## 2017-06-17 DIAGNOSIS — O24913 Unspecified diabetes mellitus in pregnancy, third trimester: Secondary | ICD-10-CM

## 2017-06-17 NOTE — Progress Notes (Signed)
   PRENATAL VISIT NOTE  Subjective:  Peggy [name] is a 26 y.o. G3P2002 at [redacted]w[redacted]d being seen today for ongoing prenatal care.  She is currently monitored for the following issues for this high-risk pregnancy and has Asthma with acute exacerbation; Smoker; Supervision of normal pregnancy, antepartum; Low vitamin D level; Pneumonia affecting pregnancy, antepartum; Asthma affecting pregnancy, antepartum; and Diabetes mellitus affecting pregnancy, antepartum on her problem list.  Patient reports occasional contractions.  Contractions: Irritability. Vag. Bleeding: None.  Movement: Present. Denies leaking of fluid.   The following portions of the patient's history were reviewed and updated as appropriate: allergies, current medications, past family history, past medical history, past social history, past surgical history and problem list. Problem list updated.  Objective:   Vitals:   06/17/17 1046  BP: 126/75  Pulse: (!) 101  Weight: 160 lb (72.6 kg)    Fetal Status: Fetal Heart Rate (bpm): 180   Movement: Present     General:  Alert, oriented and cooperative. Patient is in no acute distress.  Skin: Skin is warm and dry. No rash noted.   Cardiovascular: Normal heart rate noted  Respiratory: Normal respiratory effort, no problems with respiration noted  Abdomen: Soft, gravid, appropriate for gestational age. Pain/Pressure: Present     Pelvic:  Cervical exam performed        Extremities: Normal range of motion.  Edema: None  Mental Status: Normal motesod and affect. Normal behavior. Normal judgment and thought content.   Assessment and Plan:  Pregnancy: G3P2002 at [redacted]w[redacted]d  1. Diabetes mellitus affecting pregnancy, antepartum States FBS 85-90 and PP 100-130 - [name] MFM OB FOLLOW UP; Future  Preterm labor symptoms and general obstetric precautions including but not limited to vaginal bleeding, contractions, leaking of fluid and fetal movement were reviewed in detail with the  patient. Please refer to After Visit Summary for other counseling recommendations.  Return in about 2 weeks (around 07/01/2017).   Emeterio Reeve, MD

## 2017-06-17 NOTE — Progress Notes (Signed)
Pt complains of increased pressure and cramping. Pt would like her cervix checked due to previous preterm labor

## 2017-06-17 NOTE — Patient Instructions (Signed)
Gestational Diabetes Mellitus, Diagnosis Gestational diabetes (gestational diabetes mellitus) is a temporary form of diabetes that some women develop during pregnancy. It usually occurs around weeks 24-28 of pregnancy and goes away after delivery. Hormonal changes during pregnancy can interfere with insulin production and function, which may result in one or both of these problems:  The pancreas does not make enough of a hormone called insulin.  Cells in the body do not respond properly to insulin that the body makes (insulin resistance).  Normally, insulin allows sugars (glucose) to enter cells in the body. The cells use glucose for energy. Insulin resistance or lack of insulin causes excess glucose to build up in the blood instead of going into cells. As a result, high blood glucose (hyperglycemia) develops. What are the risks? If gestational diabetes is treated, it is unlikely to cause problems. If it is not controlled with treatment, it may cause problems during labor and delivery, and some of those problems can be harmful to the unborn baby (fetus) and the mother. Uncontrolled gestational diabetes may also cause the newborn baby to have breathing problems and low blood glucose. Women who get gestational diabetes are more likely to develop it if they get pregnant again, and they are more likely to develop type 2 diabetes in the future. What increases the risk? This condition may be more likely to develop in pregnant women who:  Are older than age 2 during pregnancy.  Have a family history of diabetes.  Are overweight.  Had gestational diabetes in the past.  Have polycystic ovarian syndrome (PCOS).  Are pregnant with twins or multiples.  Are of American-Indian, African-American, Hispanic/Latino, or Asian/Pacific Islander descent.  What are the signs or symptoms? Most women do not notice symptoms of gestational diabetes because the symptoms are similar to normal symptoms of pregnancy.  Symptoms of gestational diabetes may include:  Increased thirst (polydipsia).  Increased hunger(polyphagia).  Increased urination (polyuria).  How is this diagnosed?  This condition may be diagnosed based on your blood glucose level, which may be checked with one or more of the following blood tests:  A fasting blood glucose (FBG) test. You will not be allowed to eat (you will fast) for at least 8 hours before a blood sample is taken.  A random blood glucose test. This checks your blood glucose at any time of day regardless of when you ate.  An oral glucose tolerance test (OGTT). This is usually done during weeks 24-28 of pregnancy. ? For this test, you will have an FBG test done. Then, you will drink a beverage that contains glucose. Your blood glucose will be tested again 1 hour after drinking the glucose beverage (1-hour OGTT). ? If the 1-hour OGTT result is at or above 140 mg/dL (7.8 mmol/L), you will repeat the OGTT. This time, your blood glucose will be tested 3 hours after drinking the glucose beverage (3-hour OGTT).  If you have risk factors, you may be screened for undiagnosed type 2 diabetes at your first health care visit during your pregnancy (prenatal visit). How is this treated?  Your treatment may be managed by a specialist called an endocrinologist. This condition is treated by following instructions from your health care provider about:  Eating a healthier diet and getting more physical activity. These changes are the most important ways to manage gestational diabetes.  Checking your blood glucose. Do this as often as told.  Taking diabetes medicines or insulin every day. These will only be prescribed if they are  needed. ? If you use insulin, you may need to adjust your dosage based on how physically active you are and what foods you eat. Your health care provider will tell you how to do this.  Your health care provider will set treatment goals for you based on the  stage of your pregnancy and any other medical conditions you have. Generally, the goal of treatment is to maintain the following blood glucose levels during pregnancy:  Fasting: at or below 95 mg/dL (5.3 mmol/L).  After meals (postprandial): ? One hour after a meal: at or below 140 mg/dL (7.8 mmol/L). ? Two hours after a meal: at or below 120 mg/dL (6.7 mmol/L).  A1c (hemoglobin A1c) level: 6-6.5%.  Follow these instructions at home:  Take over-the-counter and prescription medicines only as told by your health care provider.  Manage your weight gain during pregnancy. The amount of weight that you are expected to gain depends on your pre-pregnancy BMI (body mass index).  Keep all follow-up visits as told by your health care provider. This is important. Consider asking your health care provider these questions:   Do I need to meet with a diabetes educator?  Where can I find a support group for people with diabetes?  What equipment will I need to manage my diabetes at home?  What diabetes medicines do I need, and when should I take them?  How often do I need to check my blood glucose?  What number can I call if I have questions?  When is my next appointment? Where to find more information:  For more information about diabetes, visit: ? American Diabetes Association (ADA): www.diabetes.org ? American Association of Diabetes Educators (AADE): www.diabeteseducator.org/patient-resources Contact a health care provider if:  Your blood glucose level is at or above 240 mg/dL (13.3 mmol/L).  Your blood glucose level is at or above 200 mg/dL (11.1 mmol/L) and you have ketones in your urine.  You have been sick or have had a fever for 2 days or more and you are not getting better.  You have any of the following problems for more than 6 hours: ? You cannot eat or drink. ? You have nausea and vomiting. ? You have diarrhea. Get help right away if:  Your blood glucose is below 54  mg/dL (3 mmol/L).  You become confused or you have trouble thinking clearly.  You have difficulty breathing.  You have moderate or large ketone levels in your urine.  Your baby is moving around less than usual.  You develop unusual discharge or bleeding from your vagina.  You start having contractions early (prematurely). Contractions may feel like a tightening in your lower abdomen. This information is not intended to replace advice given to you by your health care provider. Make sure you discuss any questions you have with your health care provider. Document Released: 03/24/2001 Document Revised: 05/23/2016 Document Reviewed: 01/19/2016 Elsevier Interactive Patient Education  2017 Elsevier Inc.  

## 2017-06-27 ENCOUNTER — Other Ambulatory Visit: Payer: Self-pay | Admitting: Obstetrics & Gynecology

## 2017-06-27 ENCOUNTER — Ambulatory Visit (HOSPITAL_COMMUNITY)
Admission: RE | Admit: 2017-06-27 | Discharge: 2017-06-27 | Disposition: A | Discharge status: Home / Self Care | Payer: Medicaid Other | Source: Ambulatory Visit | Attending: Obstetrics & Gynecology | Admitting: Obstetrics & Gynecology

## 2017-06-27 DIAGNOSIS — O09299 Supervision of pregnancy with other poor reproductive or obstetric history, unspecified trimester: Secondary | ICD-10-CM

## 2017-06-27 DIAGNOSIS — O99213 Obesity complicating pregnancy, third trimester: Secondary | ICD-10-CM | POA: Diagnosis not present

## 2017-06-27 DIAGNOSIS — O24913 Unspecified diabetes mellitus in pregnancy, third trimester: Secondary | ICD-10-CM | POA: Diagnosis not present

## 2017-06-27 DIAGNOSIS — O24919 Unspecified diabetes mellitus in pregnancy, unspecified trimester: Secondary | ICD-10-CM

## 2017-06-27 DIAGNOSIS — E669 Obesity, unspecified: Secondary | ICD-10-CM | POA: Diagnosis not present

## 2017-06-27 DIAGNOSIS — Z3A31 31 weeks gestation of pregnancy: Secondary | ICD-10-CM

## 2017-06-27 DIAGNOSIS — O09293 Supervision of pregnancy with other poor reproductive or obstetric history, third trimester: Secondary | ICD-10-CM | POA: Diagnosis not present

## 2017-07-01 ENCOUNTER — Ambulatory Visit (INDEPENDENT_AMBULATORY_CARE_PROVIDER_SITE_OTHER): Payer: Medicaid Other | Admitting: Obstetrics and Gynecology

## 2017-07-01 VITALS — BP 116/74 | HR 101 | Wt 159.0 lb

## 2017-07-01 DIAGNOSIS — O99513 Diseases of the respiratory system complicating pregnancy, third trimester: Secondary | ICD-10-CM

## 2017-07-01 DIAGNOSIS — O24919 Unspecified diabetes mellitus in pregnancy, unspecified trimester: Secondary | ICD-10-CM

## 2017-07-01 DIAGNOSIS — J45909 Unspecified asthma, uncomplicated: Secondary | ICD-10-CM

## 2017-07-01 DIAGNOSIS — O24913 Unspecified diabetes mellitus in pregnancy, third trimester: Secondary | ICD-10-CM

## 2017-07-01 DIAGNOSIS — Z3490 Encounter for supervision of normal pregnancy, unspecified, unspecified trimester: Secondary | ICD-10-CM

## 2017-07-01 DIAGNOSIS — O99519 Diseases of the respiratory system complicating pregnancy, unspecified trimester: Secondary | ICD-10-CM

## 2017-07-01 NOTE — Addendum Note (Signed)
Addended by: Chancy Milroy on: 07/01/2017 04:47 PM   Modules accepted: Orders

## 2017-07-01 NOTE — Progress Notes (Signed)
Subjective:  Peggy [name] is a 26 y.o. G3P2002 at [redacted]w[redacted]d being seen today for ongoing prenatal care.  She is currently monitored for the following issues for this high-risk pregnancy and has Smoker; Supervision of normal pregnancy, antepartum; Low vitamin D level; Asthma affecting pregnancy, antepartum; and Diabetes mellitus affecting pregnancy, antepartum on her problem list.  Patient reports no complaints.  Contractions: Irregular. Vag. Bleeding: None.  Movement: Present. Denies leaking of fluid.   The following portions of the patient's history were reviewed and updated as appropriate: allergies, current medications, past family history, past medical history, past social history, past surgical history and problem list. Problem list updated.  Objective:   Vitals:   07/01/17 1557  BP: 116/74  Pulse: (!) 101  Weight: 159 lb (72.1 kg)    Fetal Status: Fetal Heart Rate (bpm): 145   Movement: Present     General:  Alert, oriented and cooperative. Patient is in no acute distress.  Skin: Skin is warm and dry. No rash noted.   Cardiovascular: Normal heart rate noted  Respiratory: Normal respiratory effort, no problems with respiration noted  Abdomen: Soft, gravid, appropriate for gestational age. Pain/Pressure: Present     Pelvic:  Cervical exam deferred        Extremities: Normal range of motion.  Edema: None  Mental Status: Normal mood and affect. Normal behavior. Normal judgment and thought content.   Urinalysis:      Assessment and Plan:  Pregnancy: G3P2002 at [redacted]w[redacted]d  1. Encounter for supervision of normal pregnancy, antepartum, unspecified gravidity Stable  2. Diabetes mellitus affecting pregnancy, antepartum BS in goal range Continue with diet  3. Asthma affecting pregnancy, antepartum Stable on inhalers  Preterm labor symptoms and general obstetric precautions including but not limited to vaginal bleeding, contractions, leaking of fluid and fetal movement were reviewed  in detail with the patient. Please refer to After Visit Summary for other counseling recommendations.  Return in about 2 weeks (around 07/15/2017).   Chancy Milroy, MD

## 2017-07-09 ENCOUNTER — Inpatient Hospital Stay (HOSPITAL_COMMUNITY)
Admission: AD | Admit: 2017-07-09 | Discharge: 2017-07-09 | Disposition: A | Discharge status: Home / Self Care | Payer: Medicaid Other | Source: Ambulatory Visit | Attending: Obstetrics and Gynecology | Admitting: Obstetrics and Gynecology

## 2017-07-09 ENCOUNTER — Encounter (HOSPITAL_COMMUNITY): Payer: Self-pay

## 2017-07-09 DIAGNOSIS — O2343 Unspecified infection of urinary tract in pregnancy, third trimester: Secondary | ICD-10-CM | POA: Diagnosis not present

## 2017-07-09 DIAGNOSIS — O9989 Other specified diseases and conditions complicating pregnancy, childbirth and the puerperium: Secondary | ICD-10-CM

## 2017-07-09 DIAGNOSIS — Z87891 Personal history of nicotine dependence: Secondary | ICD-10-CM | POA: Insufficient documentation

## 2017-07-09 DIAGNOSIS — J45909 Unspecified asthma, uncomplicated: Secondary | ICD-10-CM | POA: Diagnosis not present

## 2017-07-09 DIAGNOSIS — Z3A33 33 weeks gestation of pregnancy: Secondary | ICD-10-CM | POA: Insufficient documentation

## 2017-07-09 DIAGNOSIS — M549 Dorsalgia, unspecified: Secondary | ICD-10-CM | POA: Diagnosis present

## 2017-07-09 DIAGNOSIS — Z3689 Encounter for other specified antenatal screening: Secondary | ICD-10-CM

## 2017-07-09 DIAGNOSIS — O99513 Diseases of the respiratory system complicating pregnancy, third trimester: Secondary | ICD-10-CM | POA: Diagnosis not present

## 2017-07-09 LAB — URINALYSIS, ROUTINE W REFLEX MICROSCOPIC
BILIRUBIN URINE: NEGATIVE
GLUCOSE, UA: NEGATIVE mg/dL
Hgb urine dipstick: NEGATIVE
KETONES UR: 20 mg/dL — AB
NITRITE: NEGATIVE
PH: 7 (ref 5.0–8.0)
Protein, ur: NEGATIVE mg/dL
Specific Gravity, Urine: 1.009 (ref 1.005–1.030)

## 2017-07-09 MED ORDER — NITROFURANTOIN MONOHYD MACRO 100 MG PO CAPS: 100.0000 mg | ORAL_CAPSULE | Freq: Two times a day (BID) | ORAL | 0 refills | Status: DC

## 2017-07-09 NOTE — MAU Note (Signed)
Pt complaining of mid to lower back pain, describing the pain as dull but intense. Pt states that back pain is "always there". Pt denies any new activities, exercises, etc. Pt also feeling like she needs to void, although when she voids only a small amount of urine comes out. Pt describes when she has to void as pressure in the vaginal area. Pt also complains of "dizzy spells" accompanied by blurred visions. Pt has to lay down and rest to alleviate these episodes. Pt placed on the monitor at this time. Nursing will continue to monitor.

## 2017-07-09 NOTE — Progress Notes (Signed)
Pt discharged at 1601 on 07/09/2017. Discharge instructions reviewed and pt to pick up Rx from pharmacy. Pt also instructed to stay hydrated and to come back in to be seen if needed or if pain worsens. Pt given hand written work note from Jorje Guild, NP. Patient denies questions or concerns at this time.

## 2017-07-09 NOTE — MAU Provider Note (Signed)
History     CSN: [id number]  Arrival date and time: 07/09/17 1437  Chief Complaint  Patient presents with  . Back Pain   HPI Peggy [name] is a 26 y.o. [id number] at [redacted]w[redacted]d who presents with back pain. Symptoms began this morning at 9 am. Reports constant bilateral lower back pain. Rates pain 6/10. Took 2 RS tylenol this morning with mild relief. Has been feeling irregular abdominal tightening but does not associate tightening with back pain. Reports nausea that she has had throughout pregnancy; denies vomiting. Endorses increased urinary frequency & urgency this week. Denies fever/chills, vaginal bleeding, LOF, hematuria, or dysuria. Positive fetal movement.  OB History    Gravida Para Term Preterm AB Living   3 2 2  0 0 2   SAB TAB Ectopic Multiple Live Births   0 0 0 0 2      Past Medical History:  Diagnosis Date  . Asthma   . Pyloric stenosis     Past Surgical History:  Procedure Laterality Date  . PYLOROMYOTOMY      Family History  Problem Relation Age of Onset  . Arthritis Mother   . Cancer Mother        thyroid  . Depression Mother   . Hyperlipidemia Father   . Asthma Father   . Hypertension Father   . Heart disease Father   . COPD Maternal Grandfather   . Diabetes Paternal Grandmother   . Arthritis Paternal Grandmother   . Asthma Paternal Grandmother   . Heart disease Paternal Grandmother   . Depression Paternal Grandmother   . Hypertension Paternal Grandmother   . Hyperlipidemia Paternal Grandmother   . Other Neg Hx     Social History  Substance Use Topics  . Smoking status: Former Smoker    Packs/day: 0.50    Years: 5.00    Types: Cigarettes  . Smokeless tobacco: Never Used     Comment: 4 cig/day  . Alcohol use No    Allergies:  Allergies  Allergen Reactions  . No Known Allergies     Prescriptions Prior to Admission  Medication Sig Dispense Refill Last Dose  . ACCU-CHEK FASTCLIX LANCETS MISC TEST IN THE MORNING AS FASTING AND 2 HOURS  AFTER EACH MEAL  15 Taking  . ACCU-CHEK SMARTVIEW test strip TEST IN THE MORNING FASTING AND 2 HOURS AFTER EACH MEAL  15 Taking  . acetaminophen (TYLENOL) 325 MG tablet Take 650 mg by mouth every 6 (six) hours as needed for mild pain or headache.   Taking  . Blood Glucose Monitoring Suppl DEVI Accu check meter, accu lancets #100-prn refill, accu test strips #100-prn refills. glucose levels AM fasting and 2Hrs. After each meal 1 each prn Taking  . budesonide-formoterol (SYMBICORT) 160-4.5 MCG/ACT inhaler Inhale 2 puffs into the lungs 2 (two) times daily. 3 Inhaler 0 Taking  . Prenatal Vit-Fe Phos-FA-Omega (VITAFOL GUMMIES) 3.33-0.333-34.8 MG CHEW Chew 3 tablets by mouth at bedtime. 90 tablet 12 Taking  . PROAIR HFA 108 (90 Base) MCG/ACT inhaler INHALE 1-2 PUFFS INTO THE LUNGS EVERY 4 (FOUR) HOURS AS NEEDED FOR WHEEZING OR SHORTNESS OF BREATH. 8.5 Inhaler 1 Taking  . promethazine (PHENERGAN) 25 MG tablet Take 1 tablet (25 mg total) by mouth every 6 (six) hours as needed for nausea or vomiting. 30 tablet 2 Taking  . ranitidine (ZANTAC) 150 MG tablet Take 1 tablet (150 mg total) by mouth 2 (two) times daily. 60 tablet 5 Taking  . Vitamin D, Ergocalciferol, (DRISDOL) 50000 units  CAPS capsule Take 1 capsule (50,000 Units total) by mouth every 7 (seven) days. 30 capsule 2 Taking    Review of Systems  Constitutional: Negative.   Gastrointestinal: Positive for nausea. Negative for abdominal pain, constipation, diarrhea and vomiting.  Genitourinary: Positive for frequency and urgency. Negative for dysuria, flank pain, hematuria, vaginal bleeding and vaginal discharge.  Musculoskeletal: Positive for back pain.   Physical Exam   Blood pressure 117/67, pulse 87, temperature 98.4 F (36.9 C), temperature source Oral, resp. rate 16, height 5' (1.524 m), weight 160 lb (72.6 kg), last menstrual period 11/10/2016, unknown if currently breastfeeding.  Physical Exam  Nursing note and vitals  reviewed. Constitutional: She is oriented to person, place, and time. She appears well-developed and well-nourished. No distress.  HENT:  Head: Normocephalic and atraumatic.  Eyes: Conjunctivae are normal. Right eye exhibits no discharge. Left eye exhibits no discharge. No scleral icterus.  Neck: Normal range of motion.  Cardiovascular: Normal rate, regular rhythm and normal heart sounds.   No murmur heard. Respiratory: Effort normal and breath sounds normal. No respiratory distress. She has no wheezes.  GI: Soft. Bowel sounds are normal. There is no tenderness. There is no CVA tenderness.  Musculoskeletal:       Lumbar back: Normal.  Neurological: She is alert and oriented to person, place, and time.  Skin: Skin is warm and dry. She is not diaphoretic.  Psychiatric: She has a normal mood and affect. Her behavior is normal. Judgment and thought content normal.   Dilation: Closed Effacement (%): Thick Cervical Position: Posterior Exam by:: Jorje Guild, NP  Fetal Tracing:  Baseline: 135 Variability: moderate Accelerations: 15x15 Decelerations: none  Toco: irr ctx x4 MAU Course  Procedures Results for orders placed or performed during the hospital encounter of 07/09/17 (from the past 24 hour(s))  Urinalysis, Routine w reflex microscopic     Status: Abnormal   Collection Time: 07/09/17  2:57 PM  Result Value Ref Range   Color, Urine YELLOW YELLOW   APPearance CLEAR CLEAR   Specific Gravity, Urine 1.009 1.005 - 1.030   pH 7.0 5.0 - 8.0   Glucose, UA NEGATIVE NEGATIVE mg/dL   Hgb urine dipstick NEGATIVE NEGATIVE   Bilirubin Urine NEGATIVE NEGATIVE   Ketones, ur 20 (A) NEGATIVE mg/dL   Protein, ur NEGATIVE NEGATIVE mg/dL   Nitrite NEGATIVE NEGATIVE   Leukocytes, UA SMALL (A) NEGATIVE   RBC / HPF 0-5 0 - 5 RBC/hpf   WBC, UA 6-30 0 - 5 WBC/hpf   Bacteria, UA FEW (A) NONE SEEN   Squamous Epithelial / LPF 0-5 (A) NONE SEEN   Mucous PRESENT    Amorphous Crystal PRESENT      MDM Reactive NST Irr ctx on monitor; cervix closed/thick  Assessment and Plan  A: 1. Urinary tract infection in mother during third trimester of pregnancy   2. NST (non-stress test) reactive    P: Discharge home Rx macrobid Discussed reasons to return to MAU Keep follow up appointment with OB/PCP  Urine culture pending   Jorje Guild 07/09/2017, 3:32 PM

## 2017-07-09 NOTE — Discharge Instructions (Signed)

## 2017-07-10 LAB — CULTURE, OB URINE

## 2017-07-14 ENCOUNTER — Other Ambulatory Visit: Payer: Self-pay | Admitting: Advanced Practice Midwife

## 2017-07-15 ENCOUNTER — Ambulatory Visit (INDEPENDENT_AMBULATORY_CARE_PROVIDER_SITE_OTHER): Payer: Medicaid Other | Admitting: Obstetrics and Gynecology

## 2017-07-15 VITALS — BP 122/74 | HR 93 | Wt 160.0 lb

## 2017-07-15 DIAGNOSIS — O99519 Diseases of the respiratory system complicating pregnancy, unspecified trimester: Secondary | ICD-10-CM

## 2017-07-15 DIAGNOSIS — O24919 Unspecified diabetes mellitus in pregnancy, unspecified trimester: Secondary | ICD-10-CM

## 2017-07-15 DIAGNOSIS — Z3480 Encounter for supervision of other normal pregnancy, unspecified trimester: Secondary | ICD-10-CM

## 2017-07-15 DIAGNOSIS — O24913 Unspecified diabetes mellitus in pregnancy, third trimester: Secondary | ICD-10-CM

## 2017-07-15 DIAGNOSIS — O99513 Diseases of the respiratory system complicating pregnancy, third trimester: Secondary | ICD-10-CM

## 2017-07-15 DIAGNOSIS — J45909 Unspecified asthma, uncomplicated: Secondary | ICD-10-CM

## 2017-07-15 NOTE — Progress Notes (Signed)
Subjective:  Peggy [name] is a 26 y.o. G3P2002 at [redacted]w[redacted]d being seen today for ongoing prenatal care.  She is currently monitored for the following issues for this high-risk pregnancy and has Smoker; Supervision of normal pregnancy, antepartum; Low vitamin D level; Asthma affecting pregnancy, antepartum; and Diabetes mellitus affecting pregnancy, antepartum on her problem list.  Patient reports occasional contractions and general discomforts of pregnancy.  Contractions: Irregular.  .  Movement: (!) Decreased. Denies leaking of fluid.   The following portions of the patient's history were reviewed and updated as appropriate: allergies, current medications, past family history, past medical history, past social history, past surgical history and problem list. Problem list updated.  Objective:   Vitals:   07/15/17 1311  BP: 122/74  Pulse: 93  Weight: 160 lb (72.6 kg)    Fetal Status: Fetal Heart Rate (bpm): 136   Movement: (!) Decreased     General:  Alert, oriented and cooperative. Patient is in no acute distress.  Skin: Skin is warm and dry. No rash noted.   Cardiovascular: Normal heart rate noted  Respiratory: Normal respiratory effort, no problems with respiration noted  Abdomen: Soft, gravid, appropriate for gestational age. Pain/Pressure: Present     Pelvic:  Cervical exam deferred        Extremities: Normal range of motion.  Edema: Trace  Mental Status: Normal mood and affect. Normal behavior. Normal judgment and thought content.   Urinalysis:      Assessment and Plan:  Pregnancy: G3P2002 at [redacted]w[redacted]d  1. Diabetes mellitus affecting pregnancy, antepartum Did not bring BS readings but reports in goal ranged.  Encouraged to bring BS readings to appts 58 %, 1877 gm on growth scan 6/69/18  2. Asthma affecting pregnancy, antepartum Stables on MDI's  3. Supervision of other normal pregnancy, antepartum Stable  Preterm labor symptoms and general obstetric precautions  including but not limited to vaginal bleeding, contractions, leaking of fluid and fetal movement were reviewed in detail with the patient. Please refer to After Visit Summary for other counseling recommendations.  Return in about 2 weeks (around 07/29/2017) for OB visit.   Chancy Milroy, MD

## 2017-07-15 NOTE — Patient Instructions (Signed)
Third Trimester of Pregnancy The third trimester is from week 28 through week 40 (months 7 through 9). The third trimester is a time when the unborn baby (fetus) is growing rapidly. At the end of the ninth month, the fetus is about 20 inches in length and weighs 6-10 pounds. Body changes during your third trimester Your body will continue to go through many changes during pregnancy. The changes vary from woman to woman. During the third trimester:  Your weight will continue to increase. You can expect to gain 25-35 pounds (11-16 kg) by the end of the pregnancy.  You may begin to get stretch marks on your hips, abdomen, and breasts.  You may urinate more often because the fetus is moving lower into your pelvis and pressing on your bladder.  You may develop or continue to have heartburn. This is caused by increased hormones that slow down muscles in the digestive tract.  You may develop or continue to have constipation because increased hormones slow digestion and cause the muscles that push waste through your intestines to relax.  You may develop hemorrhoids. These are swollen veins (varicose veins) in the rectum that can itch or be painful.  You may develop swollen, bulging veins (varicose veins) in your legs.  You may have increased body aches in the pelvis, back, or thighs. This is due to weight gain and increased hormones that are relaxing your joints.  You may have changes in your hair. These can include thickening of your hair, rapid growth, and changes in texture. Some women also have hair loss during or after pregnancy, or hair that feels dry or thin. Your hair will most likely return to normal after your baby is born.  Your breasts will continue to grow and they will continue to become tender. A yellow fluid (colostrum) may leak from your breasts. This is the first milk you are producing for your baby.  Your belly button may stick out.  You may notice more swelling in your hands,  face, or ankles.  You may have increased tingling or numbness in your hands, arms, and legs. The skin on your belly may also feel numb.  You may feel short of breath because of your expanding uterus.  You may have more problems sleeping. This can be caused by the size of your belly, increased need to urinate, and an increase in your body's metabolism.  You may notice the fetus "dropping," or moving lower in your abdomen (lightening).  You may have increased vaginal discharge.  You may notice your joints feel loose and you may have pain around your pelvic bone.  What to expect at prenatal visits You will have prenatal exams every 2 weeks until week 36. Then you will have weekly prenatal exams. During a routine prenatal visit:  You will be weighed to make sure you and the baby are growing normally.  Your blood pressure will be taken.  Your abdomen will be measured to track your baby's growth.  The fetal heartbeat will be listened to.  Any test results from the previous visit will be discussed.  You may have a cervical check near your due date to see if your cervix has softened or thinned (effaced).  You will be tested for Group B streptococcus. This happens between 35 and 37 weeks.  Your health care provider may ask you:  What your birth plan is.  How you are feeling.  If you are feeling the baby move.  If you have had   any abnormal symptoms, such as leaking fluid, bleeding, severe headaches, or abdominal cramping.  If you are using any tobacco products, including cigarettes, chewing tobacco, and electronic cigarettes.  If you have any questions.  Other tests or screenings that may be performed during your third trimester include:  Blood tests that check for low iron levels (anemia).  Fetal testing to check the health, activity level, and growth of the fetus. Testing is done if you have certain medical conditions or if there are problems during the  pregnancy.  Nonstress test (NST). This test checks the health of your baby to make sure there are no signs of problems, such as the baby not getting enough oxygen. During this test, a belt is placed around your belly. The baby is made to move, and its heart rate is monitored during movement.  What is false labor? False labor is a condition in which you feel small, irregular tightenings of the muscles in the womb (contractions) that usually go away with rest, changing position, or drinking water. These are called Braxton Hicks contractions. Contractions may last for hours, days, or even weeks before true labor sets in. If contractions come at regular intervals, become more frequent, increase in intensity, or become painful, you should see your health care provider. What are the signs of labor?  Abdominal cramps.  Regular contractions that start at 10 minutes apart and become stronger and more frequent with time.  Contractions that start on the top of the uterus and spread down to the lower abdomen and back.  Increased pelvic pressure and dull back pain.  A watery or bloody mucus discharge that comes from the vagina.  Leaking of amniotic fluid. This is also known as your "water breaking." It could be a slow trickle or a gush. Let your health care provider know if it has a color or strange odor. If you have any of these signs, call your health care provider right away, even if it is before your due date. Follow these instructions at home: Medicines  Follow your health care provider's instructions regarding medicine use. Specific medicines may be either safe or unsafe to take during pregnancy.  Take a prenatal vitamin that contains at least 600 micrograms (mcg) of folic acid.  If you develop constipation, try taking a stool softener if your health care provider approves. Eating and drinking  Eat a balanced diet that includes fresh fruits and vegetables, whole grains, good sources of protein  such as meat, eggs, or tofu, and low-fat dairy. Your health care provider will help you determine the amount of weight gain that is right for you.  Avoid raw meat and uncooked cheese. These carry germs that can cause birth defects in the baby.  If you have low calcium intake from food, talk to your health care provider about whether you should take a daily calcium supplement.  Eat four or five small meals rather than three large meals a day.  Limit foods that are high in fat and processed sugars, such as fried and sweet foods.  To prevent constipation: ? Drink enough fluid to keep your urine clear or pale yellow. ? Eat foods that are high in fiber, such as fresh fruits and vegetables, whole grains, and beans. Activity  Exercise only as directed by your health care provider. Most women can continue their usual exercise routine during pregnancy. Try to exercise for 30 minutes at least 5 days a week. Stop exercising if you experience uterine contractions.  Avoid heavy   lifting.  Do not exercise in extreme heat or humidity, or at high altitudes.  Wear low-heel, comfortable shoes.  Practice good posture.  You may continue to have sex unless your health care provider tells you otherwise. Relieving pain and discomfort  Take frequent breaks and rest with your legs elevated if you have leg cramps or low back pain.  Take warm sitz baths to soothe any pain or discomfort caused by hemorrhoids. Use hemorrhoid cream if your health care provider approves.  Wear a good support bra to prevent discomfort from breast tenderness.  If you develop varicose veins: ? Wear support pantyhose or compression stockings as told by your healthcare provider. ? Elevate your feet for 15 minutes, 3-4 times a day. Prenatal care  Write down your questions. Take them to your prenatal visits.  Keep all your prenatal visits as told by your health care provider. This is important. Safety  Wear your seat belt at  all times when driving.  Make a list of emergency phone numbers, including numbers for family, friends, the hospital, and police and fire departments. General instructions  Avoid cat litter boxes and soil used by cats. These carry germs that can cause birth defects in the baby. If you have a cat, ask someone to clean the litter box for you.  Do not travel far distances unless it is absolutely necessary and only with the approval of your health care provider.  Do not use hot tubs, steam rooms, or saunas.  Do not drink alcohol.  Do not use any products that contain nicotine or tobacco, such as cigarettes and e-cigarettes. If you need help quitting, ask your health care provider.  Do not use any medicinal herbs or unprescribed drugs. These chemicals affect the formation and growth of the baby.  Do not douche or use tampons or scented sanitary pads.  Do not cross your legs for long periods of time.  To prepare for the arrival of your baby: ? Take prenatal classes to understand, practice, and ask questions about labor and delivery. ? Make a trial run to the hospital. ? Visit the hospital and tour the maternity area. ? Arrange for maternity or paternity leave through employers. ? Arrange for family and friends to take care of pets while you are in the hospital. ? Purchase a rear-facing car seat and make sure you know how to install it in your car. ? Pack your hospital bag. ? Prepare the baby's nursery. Make sure to remove all pillows and stuffed animals from the baby's crib to prevent suffocation.  Visit your dentist if you have not gone during your pregnancy. Use a soft toothbrush to brush your teeth and be gentle when you floss. Contact a health care provider if:  You are unsure if you are in labor or if your water has broken.  You become dizzy.  You have mild pelvic cramps, pelvic pressure, or nagging pain in your abdominal area.  You have lower back pain.  You have persistent  nausea, vomiting, or diarrhea.  You have an unusual or bad smelling vaginal discharge.  You have pain when you urinate. Get help right away if:  Your water breaks before 37 weeks.  You have regular contractions less than 5 minutes apart before 37 weeks.  You have a fever.  You are leaking fluid from your vagina.  You have spotting or bleeding from your vagina.  You have severe abdominal pain or cramping.  You have rapid weight loss or weight gain.    You have shortness of breath with chest pain.  You notice sudden or extreme swelling of your face, hands, ankles, feet, or legs.  Your baby makes fewer than 10 movements in 2 hours.  You have severe headaches that do not go away when you take medicine.  You have vision changes. Summary  The third trimester is from week 28 through week 40, months 7 through 9. The third trimester is a time when the unborn baby (fetus) is growing rapidly.  During the third trimester, your discomfort may increase as you and your baby continue to gain weight. You may have abdominal, leg, and back pain, sleeping problems, and an increased need to urinate.  During the third trimester your breasts will keep growing and they will continue to become tender. A yellow fluid (colostrum) may leak from your breasts. This is the first milk you are producing for your baby.  False labor is a condition in which you feel small, irregular tightenings of the muscles in the womb (contractions) that eventually go away. These are called Braxton Hicks contractions. Contractions may last for hours, days, or even weeks before true labor sets in.  Signs of labor can include: abdominal cramps; regular contractions that start at 10 minutes apart and become stronger and more frequent with time; watery or bloody mucus discharge that comes from the vagina; increased pelvic pressure and dull back pain; and leaking of amniotic fluid. This information is not intended to replace advice  given to you by your health care provider. Make sure you discuss any questions you have with your health care provider. Document Released: 12/10/2001 Document Revised: 05/23/2016 Document Reviewed: 02/16/2013 Elsevier Interactive Patient Education  2017 [employer]. Fetal Movement Counts Patient Name: ________________________________________________ Patient Due Date: ____________________ What is a fetal movement count? A fetal movement count is the number of times that you feel your baby move during a certain amount of time. This may also be called a fetal kick count. A fetal movement count is recommended for every pregnant woman. You may be asked to start counting fetal movements as early as week 28 of your pregnancy. Pay attention to when your baby is most active. You may notice your baby's sleep and wake cycles. You may also notice things that make your baby move more. You should do a fetal movement count:  When your baby is normally most active.  At the same time each day.  A good time to count movements is while you are resting, after having something to eat and drink. How do I count fetal movements? 1. Find a quiet, comfortable area. Sit, or lie down on your side. 2. Write down the date, the start time and stop time, and the number of movements that you felt between those two times. Take this information with you to your health care visits. 3. For 2 hours, count kicks, flutters, swishes, rolls, and jabs. You should feel at least 10 movements during 2 hours. 4. You may stop counting after you have felt 10 movements. 5. If you do not feel 10 movements in 2 hours, have something to eat and drink. Then, keep resting and counting for 1 hour. If you feel at least 4 movements during that hour, you may stop counting. Contact a health care provider if:  You feel fewer than 4 movements in 2 hours.  Your baby is not moving like he or she usually does. Date: ____________ Start time: ____________  Stop time: ____________ Movements: ____________ Date: ____________ Start time:  ____________ Stop time: ____________ Movements: ____________ Date: ____________ Start time: ____________ Stop time: ____________ Movements: ____________ Date: ____________ Start time: ____________ Stop time: ____________ Movements: ____________ Date: ____________ Start time: ____________ Stop time: ____________ Movements: ____________ Date: ____________ Start time: ____________ Stop time: ____________ Movements: ____________ Date: ____________ Start time: ____________ Stop time: ____________ Movements: ____________ Date: ____________ Start time: ____________ Stop time: ____________ Movements: ____________ Date: ____________ Start time: ____________ Stop time: ____________ Movements: ____________ This information is not intended to replace advice given to you by your health care provider. Make sure you discuss any questions you have with your health care provider. Document Released: 01/15/2007 Document Revised: 08/14/2016 Document Reviewed: 01/25/2016 Elsevier Interactive Patient Education  [employer].

## 2017-07-17 ENCOUNTER — Encounter (HOSPITAL_COMMUNITY): Payer: Self-pay | Admitting: Nurse Practitioner

## 2017-07-17 ENCOUNTER — Emergency Department (HOSPITAL_COMMUNITY)
Admission: EM | Admit: 2017-07-17 | Discharge: 2017-07-17 | Disposition: A | Discharge status: Home / Self Care | Payer: Medicaid Other | Attending: Emergency Medicine | Admitting: Emergency Medicine

## 2017-07-17 ENCOUNTER — Emergency Department (HOSPITAL_COMMUNITY): Payer: Medicaid Other

## 2017-07-17 DIAGNOSIS — O99513 Diseases of the respiratory system complicating pregnancy, third trimester: Secondary | ICD-10-CM | POA: Diagnosis not present

## 2017-07-17 DIAGNOSIS — Z79899 Other long term (current) drug therapy: Secondary | ICD-10-CM | POA: Diagnosis not present

## 2017-07-17 DIAGNOSIS — Z3A35 35 weeks gestation of pregnancy: Secondary | ICD-10-CM | POA: Diagnosis not present

## 2017-07-17 DIAGNOSIS — J45901 Unspecified asthma with (acute) exacerbation: Secondary | ICD-10-CM | POA: Diagnosis present

## 2017-07-17 DIAGNOSIS — R000 Tachycardia, unspecified: Secondary | ICD-10-CM | POA: Diagnosis not present

## 2017-07-17 DIAGNOSIS — J4521 Mild intermittent asthma with (acute) exacerbation: Secondary | ICD-10-CM

## 2017-07-17 DIAGNOSIS — O24419 Gestational diabetes mellitus in pregnancy, unspecified control: Secondary | ICD-10-CM | POA: Insufficient documentation

## 2017-07-17 DIAGNOSIS — Z87891 Personal history of nicotine dependence: Secondary | ICD-10-CM | POA: Insufficient documentation

## 2017-07-17 DIAGNOSIS — J189 Pneumonia, unspecified organism: Secondary | ICD-10-CM | POA: Diagnosis not present

## 2017-07-17 DIAGNOSIS — R0602 Shortness of breath: Secondary | ICD-10-CM

## 2017-07-17 LAB — CBC WITH DIFFERENTIAL/PLATELET
BASOS ABS: 0 10*3/uL (ref 0.0–0.1)
BASOS PCT: 0 %
Eosinophils Absolute: 0.3 10*3/uL (ref 0.0–0.7)
Eosinophils Relative: 2 %
HCT: 31.7 % — ABNORMAL LOW (ref 36.0–46.0)
HEMOGLOBIN: 10.4 g/dL — AB (ref 12.0–15.0)
Lymphocytes Relative: 14 %
Lymphs Abs: 2 10*3/uL (ref 0.7–4.0)
MCH: 28 pg (ref 26.0–34.0)
MCHC: 32.8 g/dL (ref 30.0–36.0)
MCV: 85.2 fL (ref 78.0–100.0)
Monocytes Absolute: 0.8 10*3/uL (ref 0.1–1.0)
Monocytes Relative: 5 %
NEUTROS ABS: 11.1 10*3/uL — AB (ref 1.7–7.7)
NEUTROS PCT: 79 %
Platelets: 272 10*3/uL (ref 150–400)
RBC: 3.72 MIL/uL — AB (ref 3.87–5.11)
RDW: 13.3 % (ref 11.5–15.5)
WBC: 14.1 10*3/uL — AB (ref 4.0–10.5)

## 2017-07-17 LAB — BASIC METABOLIC PANEL
ANION GAP: 10 (ref 5–15)
BUN: 5 mg/dL — ABNORMAL LOW (ref 6–20)
CHLORIDE: 106 mmol/L (ref 101–111)
CO2: 18 mmol/L — ABNORMAL LOW (ref 22–32)
CREATININE: 0.55 mg/dL (ref 0.44–1.00)
Calcium: 8.4 mg/dL — ABNORMAL LOW (ref 8.9–10.3)
GFR calc non Af Amer: >60 mL/min (ref >60)
Glucose, Bld: 81 mg/dL (ref 65–99)
POTASSIUM: 3.4 mmol/L — AB (ref 3.5–5.1)
Sodium: 134 mmol/L — ABNORMAL LOW (ref 135–145)

## 2017-07-17 MED ORDER — ALBUTEROL SULFATE (2.5 MG/3ML) 0.083% IN NEBU
2.5000 mg | INHALATION_SOLUTION | Freq: Once | RESPIRATORY_TRACT | Status: AC
  Administered 2017-07-17: 2.5 mg via RESPIRATORY_TRACT
  Filled 2017-07-17: qty 3

## 2017-07-17 MED ORDER — DEXTROSE 5 % IV SOLN
1.0000 g | Freq: Once | INTRAVENOUS | Status: AC
  Administered 2017-07-17: 1 g via INTRAVENOUS
  Filled 2017-07-17: qty 10

## 2017-07-17 MED ORDER — IPRATROPIUM-ALBUTEROL 0.5-2.5 (3) MG/3ML IN SOLN
3.0000 mL | Freq: Once | RESPIRATORY_TRACT | Status: AC
  Administered 2017-07-17: 3 mL via RESPIRATORY_TRACT
  Filled 2017-07-17: qty 3

## 2017-07-17 MED ORDER — METHYLPREDNISOLONE SODIUM SUCC 125 MG IJ SOLR
80.0000 mg | Freq: Once | INTRAMUSCULAR | Status: AC
  Administered 2017-07-17: 80 mg via INTRAVENOUS
  Filled 2017-07-17: qty 2

## 2017-07-17 MED ORDER — AZITHROMYCIN 250 MG PO TABS
500.0000 mg | ORAL_TABLET | Freq: Once | ORAL | Status: AC
  Administered 2017-07-17: 500 mg via ORAL
  Filled 2017-07-17: qty 2

## 2017-07-17 MED ORDER — ALBUTEROL SULFATE (2.5 MG/3ML) 0.083% IN NEBU
5.0000 mg | INHALATION_SOLUTION | Freq: Once | RESPIRATORY_TRACT | Status: AC
  Administered 2017-07-17: 5 mg via RESPIRATORY_TRACT

## 2017-07-17 MED ORDER — CEPHALEXIN 500 MG PO CAPS: 500.0000 mg | ORAL_CAPSULE | Freq: Four times a day (QID) | ORAL | 0 refills | Status: DC

## 2017-07-17 MED ORDER — ALBUTEROL SULFATE (2.5 MG/3ML) 0.083% IN NEBU
INHALATION_SOLUTION | RESPIRATORY_TRACT | Status: AC
  Administered 2017-07-17: 5 mg via RESPIRATORY_TRACT
  Filled 2017-07-17: qty 6

## 2017-07-17 MED ORDER — PREDNISONE 10 MG (21) PO TBPK: ORAL_TABLET | ORAL | 0 refills | Status: DC

## 2017-07-17 MED ORDER — SODIUM CHLORIDE 0.9 % IV SOLN
INTRAVENOUS | Status: DC
  Administered 2017-07-17: 20:00:00 via INTRAVENOUS

## 2017-07-17 MED ORDER — AZITHROMYCIN 250 MG PO TABS: 250.0000 mg | ORAL_TABLET | Freq: Every day | ORAL | 0 refills | Status: DC

## 2017-07-17 MED ORDER — ALBUTEROL SULFATE HFA 108 (90 BASE) MCG/ACT IN AERS
2.0000 | INHALATION_SPRAY | RESPIRATORY_TRACT | Status: DC | PRN
  Administered 2017-07-17: 2 via RESPIRATORY_TRACT
  Filled 2017-07-17: qty 6.7

## 2017-07-17 NOTE — Discharge Instructions (Signed)
Use your neb treatments as needed. Take the medications as directed. Follow up with [facility] Pulmon[facility]

## 2017-07-17 NOTE — ED Triage Notes (Addendum)
Pt presents with c/o asthma exacerbation. She reports increased mucous production this week, initially the mucous was clearbut has become yellow color over past several days. Today she woke with wheezing, shortness of breath, tightness in her chest. She denies fevers, chills. She has used her rescue inhaler and nebulizer treatments multiple times today with no relief. She is [redacted] weeks pregnant with regular prenatal care.

## 2017-07-17 NOTE — Progress Notes (Addendum)
Peggy [name] is a 26 year old female who is G3P2002 @ [redacted]w[redacted]d with pmh of asthma who presents with complaints of shortness of breath. Symptoms initially started 4 days ago with sore throat, runny nose, and productive cough with green sputum. However, symptoms worsened this morning where she felt as though she could not breathe. She tried utilizing home inhalers and nebulizer machine without relief.  ED course: Patient was noted to be able to maintain O2 saturations on room air. Patient received IV fluids of normal saline, 125 mg of Solu-Medrol, 2 g of magnesium sulfate, and multiple breathing treatments. Chest x-ray showed signs of a pneumonia in the left lung. Patient's overall status seem to be improving.  Personally reviewed. Labs and vital signs.  On physical exam  Gen: Young female who is sick appearing in no acute distress AOX3. Resp: Bilateral expiratory wheezes appreciated with normal respiratory rate and able to talk in full sentences. Cardiac: Regular rate and rhythm without murmur. Abdomen: Protuberant abdomen with normal bowel sounds.  Assessment: Pneumonia with mild asthma exacerbation: Acute. [facility] service does not admit pregnant patients once the fetus is viable. If discharging home would recommend sending with a steroid taper, oral antibiotics, recommendations for scheduled and prn breathing treatments, and instructions for return.Marland Kitchen

## 2017-07-17 NOTE — ED Notes (Signed)
OB rapid response at bedside. Pt placed on OB monitor.

## 2017-07-17 NOTE — ED Notes (Signed)
ED Provider at bedside. 

## 2017-07-17 NOTE — Progress Notes (Signed)
Pt is a G3P2, @[id number] . She is at [facility] with complaints of worsening shortness of breath since Monday (7/16). Fetal heart tones 140bpm, with moderate, and some periods of marked, variability, multiple accelerations, no decelerations. Contractions are 3-5 minutes apart and mild (patient states she is aware of some UC's, though most are not painful.

## 2017-07-17 NOTE — ED Notes (Signed)
Rapid OB RN called and will be coming over

## 2017-07-17 NOTE — ED Provider Notes (Signed)
[facility] DE[facility] Provider Note   CSN: [id number] Arrival date & time: 07/17/17  1818  By signing my name below, I, Margit Banda, attest that this documentation has been prepared under the direction and in the presence of [place]. Janit Bern, [place]. Electronically Signed: Margit Banda, ED Scribe. 07/17/17. 7:16 PM.  History   Chief Complaint Chief Complaint  Patient presents with  . Asthma    HPI Peggy [name] is a 26 y.o. [id number] @ [redacted]w[redacted]d  with a PMHx of asthma who presents to the Emergency Department complaining of gradually worsening SOB since Monday, 07/14/17. Associated sx include wheezing, sore throat and rhinorrhea. Initially, her mucous was clear but has become yellow. Pt has tried two nebulizer treatments this morning with temporary relief. She states that this has not happened in the past, because normally the at home treatments will alleviate her sx. Pt is receiving prenatal care with FP at [facility]. Pt denies fever.    The history is provided by the patient. No language interpreter was used.  Asthma  This is a chronic problem. The current episode started more than 2 days ago. The problem has been gradually worsening. Associated symptoms include shortness of breath. Pertinent negatives include no chest pain, no abdominal pain and no headaches. Nothing aggravates the symptoms. Nothing relieves the symptoms.    Past Medical History:  Diagnosis Date  . Asthma   . Pyloric stenosis     Patient Active Problem List   Diagnosis Date Noted  . 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 Asthma affecting pregnancy, antepartum 03/28/2017  . Low vitamin D level 02/12/2017  . Supervision of normal pregnancy, antepartum 02/06/2017  . Smoker 07/25/2014    Past Surgical History:  Procedure Laterality Date  . PYLOROMYOTOMY      OB History    Gravida Para Term Preterm AB Living   3 2 2  0 0 2   SAB TAB Ectopic Multiple Live Births     0 0 0 0 2       Home Medications    Prior to Admission medications   Medication Sig Start Date End Date Taking? Authorizing Provider  ACCU-CHEK FASTCLIX LANCETS MISC TEST IN THE MORNING AS FASTING AND 2 HOURS AFTER EACH MEAL 06/24/17  Yes [provider]  ACCU-CHEK SMARTVIEW test strip TEST IN THE MORNING FASTING AND 2 HOURS AFTER [facility] MEAL 06/24/17  Yes [provider]  acetaminophen (TYLENOL) 325 MG tablet Take 650 mg by mouth every 6 (six) hours as needed for mild pain or headache.   Yes [provider]  Blood Glucose Monitoring Suppl DEVI Accu check meter, accu lancets #100-prn refill, accu test strips #100-prn refills. glucose levels AM fasting and 2Hrs. After each meal 06/04/17  Yes Woodroe Mode, MD  budesonide-formoterol [facility] 160-4.5 MCG/ACT inhaler Inhale 2 puffs into the lungs 2 (two) times daily. 05/06/17  Yes Collene Gobble, MD  nitrofurantoin, macrocrystal-monohydrate, (MACROBID) 100 MG capsule Take 1 capsule (100 mg total) by mouth 2 (two) times daily. 07/09/17  Yes Jorje Guild, NP  Prenatal Vit-Fe Phos-FA-Omega (VITAFOL GUMMIES) 3.33-0.333-34.8 MG CHEW Chew 3 tablets by mouth at bedtime. 02/06/17  Yes Denney, Rachelle A, CNM  PROAIR HFA 108 (90 Base) MCG/ACT inhaler INHALE 1-2 PUFFS INTO THE LUNGS EVERY 4 (FOUR) HOURS AS NEEDED FOR WHEEZING OR SHORTNESS OF BREATH. 06/08/17  Yes Tresea Mall, CNM  promethazine (PHENERGAN) 25 MG tablet Take 1 tablet (25 mg total) by mouth every 6 (  six) hours as needed for nausea or vomiting. 06/03/17  Yes Denney, Rachelle A, CNM  ranitidine (ZANTAC) 150 MG tablet Take 1 tablet (150 mg total) by mouth 2 (two) times daily. Patient taking differently: Take 150 mg by mouth 2 (two) times daily as needed for heartburn.  02/06/17  Yes Denney, Rachelle A, CNM  Vitamin D, Ergocalciferol, (DRISDOL) 50000 units CAPS capsule Take 1 capsule (50,000 Units total) by mouth every 7 (seven) days. 02/12/17  Yes Denney, Rachelle A, CNM   azithromycin (ZITHROMAX) 250 MG tablet Take 1 tablet (250 mg total) by mouth daily. Take one tablet daily 07/17/17   Debroah Baller M, NP  cephALEXin (KEFLEX) 500 MG capsule Take 1 capsule (500 mg total) by mouth 4 (four) times daily. 07/17/17   Ashley Murrain, NP  predniSONE (STERAPRED UNI-PAK 21 TAB) 10 MG (21) TBPK tablet Take 6 tablets PO day one then 5, 4, 3, 2, 1 07/17/17   Ashley Murrain, NP    Family History Family History  Problem Relation Age of Onset  . Arthritis Mother   . Cancer Mother        thyroid  . Depression Mother   . Hyperlipidemia Father   . Asthma Father   . Hypertension Father   . Heart disease Father   . COPD Maternal Grandfather   . Diabetes Paternal Grandmother   . Arthritis Paternal Grandmother   . Asthma Paternal Grandmother   . Heart disease Paternal Grandmother   . Depression Paternal Grandmother   . Hypertension Paternal Grandmother   . Hyperlipidemia Paternal Grandmother   . Other Neg Hx     Social History Social History  Substance Use Topics  . Smoking status: Former Smoker    Packs/day: 0.50    Years: 5.00    Types: Cigarettes  . Smokeless tobacco: Never Used     Comment: 4 cig/day  . Alcohol use No     Allergies   No known allergies   Review of Systems Review of Systems  Constitutional: Positive for chills. Negative for fever (?).  HENT: Positive for ear pain, rhinorrhea and sore throat. Negative for trouble swallowing.   Eyes: Negative for visual disturbance.  Respiratory: Positive for cough, chest tightness, shortness of breath and wheezing.   Cardiovascular: Negative for chest pain.  Gastrointestinal: Negative for abdominal pain, nausea and vomiting.       34.[redacted] weeks gestation  Genitourinary: Negative for dysuria, vaginal bleeding and vaginal discharge.  Musculoskeletal: Negative for back pain.  Skin: Negative for rash.  Neurological: Negative for headaches.  Psychiatric/Behavioral: Negative for confusion.     Physical  Exam Updated Vital Signs BP 110/68   Pulse (!) 108   Temp 98.5 F (36.9 C) (Oral)   Resp 20   LMP 11/10/2016 (Exact Date)   SpO2 95%   Physical Exam  Constitutional: She is oriented to person, place, and time. She appears well-developed and well-nourished. No distress.  HENT:  Head: Normocephalic.  Mouth/Throat: Uvula is midline and mucous membranes are normal. Posterior oropharyngeal erythema present. No posterior oropharyngeal edema.  Eyes: EOM are normal.  Neck: Normal range of motion.  Cardiovascular: Regular rhythm.  Tachycardia present.   Pulmonary/Chest: She has decreased breath sounds. She has wheezes.  Expiratory and inspiratory wheezing bilateral. Decreased breath sounds. O2 SAT 96%.  Abdominal: Soft. There is no tenderness.  Gravid @ [redacted]w[redacted]d   Musculoskeletal: Normal range of motion.  Neurological: She is alert and oriented to person, place, and time.  Skin:  Skin is warm and dry.  Psychiatric: She has a normal mood and affect. Her behavior is normal.  Nursing note and vitals reviewed.    ED Treatments / Results  DIAGNOSTIC STUDIES: Oxygen Saturation is 96% on RA, normal by my interpretation.   COORDINATION OF CARE: 7:16 PM-Discussed next steps with pt which includes a breathing treatment. Pt verbalized understanding and is agreeable with the plan.   Labs (all labs ordered are listed, but only abnormal results are displayed) Labs Reviewed  CBC WITH DIFFERENTIAL/PLATELET - Abnormal; Notable for the following:       Result Value   WBC 14.1 (*)    RBC 3.72 (*)    Hemoglobin 10.4 (*)    HCT 31.7 (*)    Neutro Abs 11.1 (*)    All other components within normal limits  BASIC METABOLIC PANEL - Abnormal; Notable for the following:    Sodium 134 (*)    Potassium 3.4 (*)    CO2 18 (*)    BUN 5 (*)    Calcium 8.4 (*)    All other components within normal limits    EKG  EKG Interpretation  Date/Time:  Thursday July 17 2017 18:23:00 EDT Ventricular Rate:   112 PR Interval:  178 QRS Duration: 70 QT Interval:  324 QTC Calculation: 442 R Axis:   81 Text Interpretation:  Sinus tachycardia Otherwise normal ECG rate is faster compared to Mar 2018 Confirmed by Sherwood Gambler [phone]) on 07/17/2017 9:40:00 PM Also confirmed by Sherwood Gambler [phone]), editor Hattie Perch ([id number])  on 07/18/2017 8:06:49 AM       Radiology Dg Chest 2 View  Result Date: 07/17/2017 CLINICAL DATA:  Wheezing, shortness of breath, tightness in chest. [redacted] weeks pregnant. EXAM: CHEST  2 VIEW COMPARISON:  Chest x-ray dated 05/22/2017. FINDINGS: Ill-defined airspace opacity at the left lung base, most likely lingular pneumonia. Right lung is clear. No pleural effusion or pneumothorax seen. Heart size and mediastinal contours are normal. Osseous structures are unremarkable. IMPRESSION: New airspace opacity at the left lung base, most likely a lingular pneumonia. Electronically Signed   By: Franki Cabot M.D.   On: 07/17/2017 19:53    Procedures Procedures (including critical care time)  Medications Ordered in ED Medications  albuterol (PROVENTIL) (2.5 MG/3ML) 0.083% nebulizer solution 5 mg (5 mg Nebulization Given 07/17/17 1835)  ipratropium-albuterol (DUONEB) 0.5-2.5 (3) MG/3ML nebulizer solution 3 mL (3 mLs Nebulization Given 07/17/17 1959)  methylPREDNISolone sodium succinate (SOLU-MEDROL) 125 mg/2 mL injection 80 mg (80 mg Intravenous Given 07/17/17 2002)  cefTRIAXone (ROCEPHIN) 1 g in dextrose 5 % 50 mL IVPB (0 g Intravenous Stopped 07/17/17 2203)  azithromycin (ZITHROMAX) tablet 500 mg (500 mg Oral Given 07/17/17 2125)  albuterol (PROVENTIL) (2.5 MG/3ML) 0.083% nebulizer solution 2.5 mg (2.5 mg Nebulization Given 07/17/17 2214)  albuterol (PROVENTIL) (2.5 MG/3ML) 0.083% nebulizer solution 2.5 mg (2.5 mg Nebulization Given 07/17/17 2245)     Initial Impression / Assessment and Plan / ED Course  I have reviewed the triage vital signs and the nursing notes.  Pertinent  labs & imaging results that were available during my care of the patient were reviewed by me and considered in my medical decision making (see chart for details).  8:17 PM Spoke to Dr. Geralyn Flash who was updated on what was done with the pt and he agreed to the treatment plan. He advised I speak with the hospitalists.   I spoke with Dr. Tamala Julian, the hospitalists , and he states he will see  the patient but that will not admit to [facility] due to the patient being 34+ weeks gestation.   I notified Dr. Geralyn Flash of consult with hospitalists and Dr. Geralyn Flash advises that he has no OB reason to admit the patient to [facility] and she will need admission to [facility].   After multiple neb treatments, IV antibiotics, and steroids the patient did improve significantly but did not completely clear.  The hospitalists did see the patient and I discussed with him possible d/c and f/u with [facility] where the patient is currently a patient. He feels the patient can go home. See his note.   11:13 PM discussed with the patient plan of care. Discussed in detail need for close f/u and return precautions. She will use her nebulizer at home as needed and take the medications as directed. She will  follow up with [facility] or return for worsening symptoms. Patient appears stable for d/c at this time .   Final Clinical Impressions(s) / ED Diagnoses   Final diagnoses:  Pneumonia affecting pregnancy in third trimester  Mild asthma with exacerbation, unspecified whether persistent    New Prescriptions Discharge Medication List as of 07/17/2017 11:13 PM    START taking these medications   Details  azithromycin (ZITHROMAX) 250 MG tablet Take 1 tablet (250 mg total) by mouth daily. Take one tablet daily, Starting Thu 07/17/2017, Print    cephALEXin (KEFLEX) 500 MG capsule Take 1 capsule (500 mg total) by mouth 4 (four) times daily., Starting Thu 07/17/2017, Print    predniSONE (STERAPRED UNI-PAK 21 TAB) 10 MG (21) TBPK  tablet Take 6 tablets PO day one then 5, 4, 3, 2, 1, Print      I personally performed the services described in this documentation, which was scribed in my presence. The recorded information has been reviewed and is accurate.    Debroah Baller [place], [place] 07/18/17 Patrecia Pour    Sherwood Gambler, MD 07/20/17 [phone]

## 2017-07-17 NOTE — ED Notes (Signed)
Dr. Smith at bedside.

## 2017-07-30 ENCOUNTER — Ambulatory Visit (INDEPENDENT_AMBULATORY_CARE_PROVIDER_SITE_OTHER): Payer: Medicaid Other | Admitting: Obstetrics & Gynecology

## 2017-07-30 ENCOUNTER — Other Ambulatory Visit (HOSPITAL_COMMUNITY)
Admission: RE | Admit: 2017-07-30 | Discharge: 2017-07-30 | Disposition: A | Discharge status: Home / Self Care | Payer: Medicaid Other | Source: Ambulatory Visit | Attending: Obstetrics & Gynecology | Admitting: Obstetrics & Gynecology

## 2017-07-30 VITALS — BP 110/71 | HR 98 | Wt 158.8 lb

## 2017-07-30 DIAGNOSIS — O24919 Unspecified diabetes mellitus in pregnancy, unspecified trimester: Secondary | ICD-10-CM | POA: Insufficient documentation

## 2017-07-30 DIAGNOSIS — O24913 Unspecified diabetes mellitus in pregnancy, third trimester: Secondary | ICD-10-CM

## 2017-07-30 DIAGNOSIS — Z3480 Encounter for supervision of other normal pregnancy, unspecified trimester: Secondary | ICD-10-CM

## 2017-07-30 NOTE — Patient Instructions (Signed)
Third Trimester of Pregnancy The third trimester is from week 28 through week 40 (months 7 through 9). The third trimester is a time when the unborn baby (fetus) is growing rapidly. At the end of the ninth month, the fetus is about 20 inches in length and weighs 6-10 pounds. Body changes during your third trimester Your body will continue to go through many changes during pregnancy. The changes vary from woman to woman. During the third trimester:  Your weight will continue to increase. You can expect to gain 25-35 pounds (11-16 kg) by the end of the pregnancy.  You may begin to get stretch marks on your hips, abdomen, and breasts.  You may urinate more often because the fetus is moving lower into your pelvis and pressing on your bladder.  You may develop or continue to have heartburn. This is caused by increased hormones that slow down muscles in the digestive tract.  You may develop or continue to have constipation because increased hormones slow digestion and cause the muscles that push waste through your intestines to relax.  You may develop hemorrhoids. These are swollen veins (varicose veins) in the rectum that can itch or be painful.  You may develop swollen, bulging veins (varicose veins) in your legs.  You may have increased body aches in the pelvis, back, or thighs. This is due to weight gain and increased hormones that are relaxing your joints.  You may have changes in your hair. These can include thickening of your hair, rapid growth, and changes in texture. Some women also have hair loss during or after pregnancy, or hair that feels dry or thin. Your hair will most likely return to normal after your baby is born.  Your breasts will continue to grow and they will continue to become tender. A yellow fluid (colostrum) may leak from your breasts. This is the first milk you are producing for your baby.  Your belly button may stick out.  You may notice more swelling in your hands,  face, or ankles.  You may have increased tingling or numbness in your hands, arms, and legs. The skin on your belly may also feel numb.  You may feel short of breath because of your expanding uterus.  You may have more problems sleeping. This can be caused by the size of your belly, increased need to urinate, and an increase in your body's metabolism.  You may notice the fetus "dropping," or moving lower in your abdomen (lightening).  You may have increased vaginal discharge.  You may notice your joints feel loose and you may have pain around your pelvic bone.  What to expect at prenatal visits You will have prenatal exams every 2 weeks until week 36. Then you will have weekly prenatal exams. During a routine prenatal visit:  You will be weighed to make sure you and the baby are growing normally.  Your blood pressure will be taken.  Your abdomen will be measured to track your baby's growth.  The fetal heartbeat will be listened to.  Any test results from the previous visit will be discussed.  You may have a cervical check near your due date to see if your cervix has softened or thinned (effaced).  You will be tested for Group B streptococcus. This happens between 35 and 37 weeks.  Your health care provider may ask you:  What your birth plan is.  How you are feeling.  If you are feeling the baby move.  If you have had   any abnormal symptoms, such as leaking fluid, bleeding, severe headaches, or abdominal cramping.  If you are using any tobacco products, including cigarettes, chewing tobacco, and electronic cigarettes.  If you have any questions.  Other tests or screenings that may be performed during your third trimester include:  Blood tests that check for low iron levels (anemia).  Fetal testing to check the health, activity level, and growth of the fetus. Testing is done if you have certain medical conditions or if there are problems during the  pregnancy.  Nonstress test (NST). This test checks the health of your baby to make sure there are no signs of problems, such as the baby not getting enough oxygen. During this test, a belt is placed around your belly. The baby is made to move, and its heart rate is monitored during movement.  What is false labor? False labor is a condition in which you feel small, irregular tightenings of the muscles in the womb (contractions) that usually go away with rest, changing position, or drinking water. These are called Braxton Hicks contractions. Contractions may last for hours, days, or even weeks before true labor sets in. If contractions come at regular intervals, become more frequent, increase in intensity, or become painful, you should see your health care provider. What are the signs of labor?  Abdominal cramps.  Regular contractions that start at 10 minutes apart and become stronger and more frequent with time.  Contractions that start on the top of the uterus and spread down to the lower abdomen and back.  Increased pelvic pressure and dull back pain.  A watery or bloody mucus discharge that comes from the vagina.  Leaking of amniotic fluid. This is also known as your "water breaking." It could be a slow trickle or a gush. Let your health care provider know if it has a color or strange odor. If you have any of these signs, call your health care provider right away, even if it is before your due date. Follow these instructions at home: Medicines  Follow your health care provider's instructions regarding medicine use. Specific medicines may be either safe or unsafe to take during pregnancy.  Take a prenatal vitamin that contains at least 600 micrograms (mcg) of folic acid.  If you develop constipation, try taking a stool softener if your health care provider approves. Eating and drinking  Eat a balanced diet that includes fresh fruits and vegetables, whole grains, good sources of protein  such as meat, eggs, or tofu, and low-fat dairy. Your health care provider will help you determine the amount of weight gain that is right for you.  Avoid raw meat and uncooked cheese. These carry germs that can cause birth defects in the baby.  If you have low calcium intake from food, talk to your health care provider about whether you should take a daily calcium supplement.  Eat four or five small meals rather than three large meals a day.  Limit foods that are high in fat and processed sugars, such as fried and sweet foods.  To prevent constipation: ? Drink enough fluid to keep your urine clear or pale yellow. ? Eat foods that are high in fiber, such as fresh fruits and vegetables, whole grains, and beans. Activity  Exercise only as directed by your health care provider. Most women can continue their usual exercise routine during pregnancy. Try to exercise for 30 minutes at least 5 days a week. Stop exercising if you experience uterine contractions.  Avoid heavy   lifting.  Do not exercise in extreme heat or humidity, or at high altitudes.  Wear low-heel, comfortable shoes.  Practice good posture.  You may continue to have sex unless your health care provider tells you otherwise. Relieving pain and discomfort  Take frequent breaks and rest with your legs elevated if you have leg cramps or low back pain.  Take warm sitz baths to soothe any pain or discomfort caused by hemorrhoids. Use hemorrhoid cream if your health care provider approves.  Wear a good support bra to prevent discomfort from breast tenderness.  If you develop varicose veins: ? Wear support pantyhose or compression stockings as told by your healthcare provider. ? Elevate your feet for 15 minutes, 3-4 times a day. Prenatal care  Write down your questions. Take them to your prenatal visits.  Keep all your prenatal visits as told by your health care provider. This is important. Safety  Wear your seat belt at  all times when driving.  Make a list of emergency phone numbers, including numbers for family, friends, the hospital, and police and fire departments. General instructions  Avoid cat litter boxes and soil used by cats. These carry germs that can cause birth defects in the baby. If you have a cat, ask someone to clean the litter box for you.  Do not travel far distances unless it is absolutely necessary and only with the approval of your health care provider.  Do not use hot tubs, steam rooms, or saunas.  Do not drink alcohol.  Do not use any products that contain nicotine or tobacco, such as cigarettes and e-cigarettes. If you need help quitting, ask your health care provider.  Do not use any medicinal herbs or unprescribed drugs. These chemicals affect the formation and growth of the baby.  Do not douche or use tampons or scented sanitary pads.  Do not cross your legs for long periods of time.  To prepare for the arrival of your baby: ? Take prenatal classes to understand, practice, and ask questions about labor and delivery. ? Make a trial run to the hospital. ? Visit the hospital and tour the maternity area. ? Arrange for maternity or paternity leave through employers. ? Arrange for family and friends to take care of pets while you are in the hospital. ? Purchase a rear-facing car seat and make sure you know how to install it in your car. ? Pack your hospital bag. ? Prepare the baby's nursery. Make sure to remove all pillows and stuffed animals from the baby's crib to prevent suffocation.  Visit your dentist if you have not gone during your pregnancy. Use a soft toothbrush to brush your teeth and be gentle when you floss. Contact a health care provider if:  You are unsure if you are in labor or if your water has broken.  You become dizzy.  You have mild pelvic cramps, pelvic pressure, or nagging pain in your abdominal area.  You have lower back pain.  You have persistent  nausea, vomiting, or diarrhea.  You have an unusual or bad smelling vaginal discharge.  You have pain when you urinate. Get help right away if:  Your water breaks before 37 weeks.  You have regular contractions less than 5 minutes apart before 37 weeks.  You have a fever.  You are leaking fluid from your vagina.  You have spotting or bleeding from your vagina.  You have severe abdominal pain or cramping.  You have rapid weight loss or weight gain.    You have shortness of breath with chest pain.  You notice sudden or extreme swelling of your face, hands, ankles, feet, or legs.  Your baby makes fewer than 10 movements in 2 hours.  You have severe headaches that do not go away when you take medicine.  You have vision changes. Summary  The third trimester is from week 28 through week 40, months 7 through 9. The third trimester is a time when the unborn baby (fetus) is growing rapidly.  During the third trimester, your discomfort may increase as you and your baby continue to gain weight. You may have abdominal, leg, and back pain, sleeping problems, and an increased need to urinate.  During the third trimester your breasts will keep growing and they will continue to become tender. A yellow fluid (colostrum) may leak from your breasts. This is the first milk you are producing for your baby.  False labor is a condition in which you feel small, irregular tightenings of the muscles in the womb (contractions) that eventually go away. These are called Braxton Hicks contractions. Contractions may last for hours, days, or even weeks before true labor sets in.  Signs of labor can include: abdominal cramps; regular contractions that start at 10 minutes apart and become stronger and more frequent with time; watery or bloody mucus discharge that comes from the vagina; increased pelvic pressure and dull back pain; and leaking of amniotic fluid. This information is not intended to replace advice  given to you by your health care provider. Make sure you discuss any questions you have with your health care provider. Document Released: 12/10/2001 Document Revised: 05/23/2016 Document Reviewed: 02/16/2013 Else[employer] Interactive Patient Education  2017 [employer]  

## 2017-07-30 NOTE — Progress Notes (Signed)
   PRENATAL VISIT NOTE  Subjective:  Peggy [name] is a 26 y.o. G3P2002 at [redacted]w[redacted]d being seen today for ongoing prenatal care.  She is currently monitored for the following issues for this high-risk pregnancy and has Smoker; Supervision of normal pregnancy, antepartum; Low vitamin D level; Asthma affecting pregnancy, antepartum; and Diabetes mellitus affecting pregnancy, antepartum on her problem list.  Patient reports no complaints.  Contractions: Irregular. Vag. Bleeding: None.  Movement: Present. Denies leaking of fluid.   The following portions of the patient's history were reviewed and updated as appropriate: allergies, current medications, past family history, past medical history, past social history, past surgical history and problem list. Problem list updated.  Objective:   Vitals:   07/30/17 0855  BP: 110/71  Pulse: 98  Weight: 158 lb 12.8 oz (72 kg)    Fetal Status: Fetal Heart Rate (bpm): 150   Movement: Present     General:  Alert, oriented and cooperative. Patient is in no acute distress.  Skin: Skin is warm and dry. No rash noted.   Cardiovascular: Normal heart rate noted  Respiratory: Normal respiratory effort, no problems with respiration noted  Abdomen: Soft, gravid, appropriate for gestational age.  Pain/Pressure: Present     Pelvic: Cervical exam performed        Extremities: Normal range of motion.  Edema: Trace  Mental Status:  Normal mood and affect. Normal behavior. Normal judgment and thought content.   Assessment and Plan:  Pregnancy: G3P2002 at [redacted]w[redacted]d  1. Diabetes mellitus affecting pregnancy, antepartum States BG in range - Strep Gp B NAA - Cervicovaginal ancillary only - [name] MFM OB FOLLOW UP; Future  2. Supervision of other normal pregnancy, antepartum   Preterm labor symptoms and general obstetric precautions including but not limited to vaginal bleeding, contractions, leaking of fluid and fetal movement were reviewed in detail with the  patient. Please refer to After Visit Summary for other counseling recommendations.  Return in about 1 week (around 08/06/2017).   Emeterio Reeve, MD

## 2017-07-30 NOTE — Progress Notes (Signed)
ROB GBS 

## 2017-08-01 LAB — CERVICOVAGINAL ANCILLARY ONLY
CHLAMYDIA, DNA PROBE: NEGATIVE
NEISSERIA GONORRHEA: NEGATIVE

## 2017-08-01 LAB — STREP GP B NAA: Strep Gp B NAA: POSITIVE — AB

## 2017-08-06 ENCOUNTER — Ambulatory Visit (INDEPENDENT_AMBULATORY_CARE_PROVIDER_SITE_OTHER): Payer: Medicaid Other | Admitting: Obstetrics & Gynecology

## 2017-08-06 ENCOUNTER — Ambulatory Visit (HOSPITAL_COMMUNITY)
Admission: RE | Admit: 2017-08-06 | Discharge: 2017-08-06 | Disposition: A | Discharge status: Home / Self Care | Payer: Medicaid Other | Source: Ambulatory Visit | Attending: Obstetrics & Gynecology | Admitting: Obstetrics & Gynecology

## 2017-08-06 VITALS — BP 114/71 | HR 88 | Wt 157.9 lb

## 2017-08-06 DIAGNOSIS — Z3483 Encounter for supervision of other normal pregnancy, third trimester: Secondary | ICD-10-CM

## 2017-08-06 DIAGNOSIS — O24913 Unspecified diabetes mellitus in pregnancy, third trimester: Secondary | ICD-10-CM

## 2017-08-06 DIAGNOSIS — O24919 Unspecified diabetes mellitus in pregnancy, unspecified trimester: Secondary | ICD-10-CM

## 2017-08-06 DIAGNOSIS — O24410 Gestational diabetes mellitus in pregnancy, diet controlled: Secondary | ICD-10-CM | POA: Diagnosis not present

## 2017-08-06 DIAGNOSIS — Z3480 Encounter for supervision of other normal pregnancy, unspecified trimester: Secondary | ICD-10-CM

## 2017-08-06 DIAGNOSIS — Z3A37 37 weeks gestation of pregnancy: Secondary | ICD-10-CM | POA: Insufficient documentation

## 2017-08-06 NOTE — Patient Instructions (Signed)
Vaginal Delivery Vaginal delivery means that you will give birth by pushing your baby out of your birth canal (vagina). A team of health care providers will help you before, during, and after vaginal delivery. Birth experiences are unique for every woman and every pregnancy, and birth experiences vary depending on where you choose to give birth. What should I do to prepare for my baby's birth? Before your baby is born, it is important to talk with your health care provider about:  Your labor and delivery preferences. These may include: ? Medicines that you may be given. ? How you will manage your pain. This might include non-medical pain relief techniques or injectable pain relief such as epidural analgesia. ? How you and your baby will be monitored during labor and delivery. ? Who may be in the labor and delivery room with you. ? Your feelings about surgical delivery of your baby (cesarean delivery, or C-section) if this becomes necessary. ? Your feelings about receiving donated blood through an IV tube (blood transfusion) if this becomes necessary.  Whether you are able: ? To take pictures or videos of the birth. ? To eat during labor and delivery. ? To move around, walk, or change positions during labor and delivery.  What to expect after your baby is born, such as: ? Whether delayed umbilical cord clamping and cutting is offered. ? Who will care for your baby right after birth. ? Medicines or tests that may be recommended for your baby. ? Whether breastfeeding is supported in your hospital or birth center. ? How long you will be in the hospital or birth center.  How any medical conditions you have may affect your baby or your labor and delivery experience.  To prepare for your baby's birth, you should also:  Attend all of your health care visits before delivery (prenatal visits) as recommended by your health care provider. This is important.  Prepare your home for your baby's  arrival. Make sure that you have: ? Diapers. ? Baby clothing. ? Feeding equipment. ? Safe sleeping arrangements for you and your baby.  Install a car seat in your vehicle. Have your car seat checked by a certified car seat installer to make sure that it is installed safely.  Think about who will help you with your new baby at home for at least the first several weeks after delivery.  What can I expect when I arrive at the birth center or hospital? Once you are in labor and have been admitted into the hospital or birth center, your health care provider may:  Review your pregnancy history and any concerns you have.  Insert an IV tube into one of your veins. This is used to give you fluids and medicines.  Check your blood pressure, pulse, temperature, and heart rate (vital signs).  Check whether your bag of water (amniotic sac) has broken (ruptured).  Talk with you about your birth plan and discuss pain control options.  Monitoring Your health care provider may monitor your contractions (uterine monitoring) and your baby's heart rate (fetal monitoring). You may need to be monitored:  Often, but not continuously (intermittently).  All the time or for long periods at a time (continuously). Continuous monitoring may be needed if: ? You are taking certain medicines, such as medicine to relieve pain or make your contractions stronger. ? You have pregnancy or labor complications.  Monitoring may be done by:  Placing a special stethoscope or a handheld monitoring device on your abdomen to   check your baby's heartbeat, and feeling your abdomen for contractions. This method of monitoring does not continuously record your baby's heartbeat or your contractions.  Placing monitors on your abdomen (external monitors) to record your baby's heartbeat and the frequency and length of contractions. You may not have to wear external monitors all the time.  Placing monitors inside of your uterus  (internal monitors) to record your baby's heartbeat and the frequency, length, and strength of your contractions. ? Your health care provider may use internal monitors if he or she needs more information about the strength of your contractions or your baby's heart rate. ? Internal monitors are put in place by passing a thin, flexible wire through your vagina and into your uterus. Depending on the type of monitor, it may remain in your uterus or on your baby's head until birth. ? Your health care provider will discuss the benefits and risks of internal monitoring with you and will ask for your permission before inserting the monitors.  Telemetry. This is a type of continuous monitoring that can be done with external or internal monitors. Instead of having to stay in bed, you are able to move around during telemetry. Ask your health care provider if telemetry is an option for you.  Physical exam Your health care provider may perform a physical exam. This may include:  Checking whether your baby is positioned: ? With the head toward your vagina (head-down). This is most common. ? With the head toward the top of your uterus (head-up or breech). If your baby is in a breech position, your health care provider may try to turn your baby to a head-down position so you can deliver vaginally. If it does not seem that your baby can be born vaginally, your provider may recommend surgery to deliver your baby. In rare cases, you may be able to deliver vaginally if your baby is head-up (breech delivery). ? Lying sideways (transverse). Babies that are lying sideways cannot be delivered vaginally.  Checking your cervix to determine: ? Whether it is thinning out (effacing). ? Whether it is opening up (dilating). ? How low your baby has moved into your birth canal.  What are the three stages of labor and delivery?  Normal labor and delivery is divided into the following three stages: Stage 1  Stage 1 is the  longest stage of labor, and it can last for hours or days. Stage 1 includes: ? Early labor. This is when contractions may be irregular, or regular and mild. Generally, early labor contractions are more than 10 minutes apart. ? Active labor. This is when contractions get longer, more regular, more frequent, and more intense. ? The transition phase. This is when contractions happen very close together, are very intense, and may last longer than during any other part of labor.  Contractions generally feel mild, infrequent, and irregular at first. They get stronger, more frequent (about every 2-3 minutes), and more regular as you progress from early labor through active labor and transition.  Many women progress through stage 1 naturally, but you may need help to continue making progress. If this happens, your health care provider may talk with you about: ? Rupturing your amniotic sac if it has not ruptured yet. ? Giving you medicine to help make your contractions stronger and more frequent.  Stage 1 ends when your cervix is completely dilated to 4 inches (10 cm) and completely effaced. This happens at the end of the transition phase. Stage 2  Once   your cervix is completely effaced and dilated to 4 inches (10 cm), you may start to feel an urge to push. It is common for the body to naturally take a rest before feeling the urge to push, especially if you received an epidural or certain other pain medicines. This rest period may last for up to 1-2 hours, depending on your unique labor experience.  During stage 2, contractions are generally less painful, because pushing helps relieve contraction pain. Instead of contraction pain, you may feel stretching and burning pain, especially when the widest part of your baby's head passes through the vaginal opening (crowning).  Your health care provider will closely monitor your pushing progress and your baby's progress through the vagina during stage 2.  Your  health care provider may massage the area of skin between your vaginal opening and anus (perineum) or apply warm compresses to your perineum. This helps it stretch as the baby's head starts to crown, which can help prevent perineal tearing. ? In some cases, an incision may be made in your perineum (episiotomy) to allow the baby to pass through the vaginal opening. An episiotomy helps to make the opening of the vagina larger to allow more room for the baby to fit through.  It is very important to breathe and focus so your health care provider can control the delivery of your baby's head. Your health care provider may have you decrease the intensity of your pushing, to help prevent perineal tearing.  After delivery of your baby's head, the shoulders and the rest of the body generally deliver very quickly and without difficulty.  Once your baby is delivered, the umbilical cord may be cut right away, or this may be delayed for 1-2 minutes, depending on your baby's health. This may vary among health care providers, hospitals, and birth centers.  If you and your baby are healthy enough, your baby may be placed on your chest or abdomen to help maintain the baby's temperature and to help you bond with each other. Some mothers and babies start breastfeeding at this time. Your health care team will dry your baby and help keep your baby warm during this time.  Your baby may need immediate care if he or she: ? Showed signs of distress during labor. ? Has a medical condition. ? Was born too early (prematurely). ? Had a bowel movement before birth (meconium). ? Shows signs of difficulty transitioning from being inside the uterus to being outside of the uterus. If you are planning to breastfeed, your health care team will help you begin a feeding. Stage 3  The third stage of labor starts immediately after the birth of your baby and ends after you deliver the placenta. The placenta is an organ that develops  during pregnancy to provide oxygen and nutrients to your baby in the womb.  Delivering the placenta may require some pushing, and you may have mild contractions. Breastfeeding can stimulate contractions to help you deliver the placenta.  After the placenta is delivered, your uterus should tighten (contract) and become firm. This helps to stop bleeding in your uterus. To help your uterus contract and to control bleeding, your health care provider may: ? Give you medicine by injection, through an IV tube, by mouth, or through your rectum (rectally). ? Massage your abdomen or perform a vaginal exam to remove any blood clots that are left in your uterus. ? Empty your bladder by placing a thin, flexible tube (catheter) into your bladder. ? Encourage   you to breastfeed your baby. After labor is over, you and your baby will be monitored closely to ensure that you are both healthy until you are ready to go home. Your health care team will teach you how to care for yourself and your baby. This information is not intended to replace advice given to you by your health care provider. Make sure you discuss any questions you have with your health care provider. Document Released: 09/24/2008 Document Revised: 07/05/2016 Document Reviewed: 12/31/2015 Elsevier Interactive Patient Education  2018 [employer]  

## 2017-08-06 NOTE — Progress Notes (Signed)
   PRENATAL VISIT NOTE  Subjective:  Peggy [name] is a 26 y.o. G3P2002 at [redacted]w[redacted]d being seen today for ongoing prenatal care.  She is currently monitored for the following issues for this high-risk pregnancy and has Smoker; Supervision of normal pregnancy, antepartum; Low vitamin D level; Asthma affecting pregnancy, antepartum; and Diabetes mellitus affecting pregnancy, antepartum on her problem list.  Patient reports occasional contractions.  Contractions: Irregular. Vag. Bleeding: None.  Movement: Present. Denies leaking of fluid.   The following portions of the patient's history were reviewed and updated as appropriate: allergies, current medications, past family history, past medical history, past social history, past surgical history and problem list. Problem list updated.  Objective:   Vitals:   08/06/17 1007  BP: 114/71  Pulse: 88  Weight: 157 lb 14.4 oz (71.6 kg)    Fetal Status: Fetal Heart Rate (bpm): 145 Fundal Height: 37 cm Movement: Present     General:  Alert, oriented and cooperative. Patient is in no acute distress.  Skin: Skin is warm and dry. No rash noted.   Cardiovascular: Normal heart rate noted  Respiratory: Normal respiratory effort, no problems with respiration noted  Abdomen: Soft, gravid, appropriate for gestational age.  Pain/Pressure: Present     Pelvic: Cervical exam deferred        Extremities: Normal range of motion.  Edema: None  Mental Status:  Normal mood and affect. Normal behavior. Normal judgment and thought content.   Assessment and Plan:  Pregnancy: G3P2002 at [redacted]w[redacted]d  1. Supervision of other normal pregnancy, antepartum US done today nl growth  2. Diabetes mellitus affecting pregnancy, antepartum States normal BG   Term labor symptoms and general obstetric precautions including but not limited to vaginal bleeding, contractions, leaking of fluid and fetal movement were reviewed in detail with the patient. Please refer to After Visit  Summary for other counseling recommendations.  Return in about 1 week (around 08/13/2017).   Emeterio Reeve, MD

## 2017-08-09 ENCOUNTER — Inpatient Hospital Stay (HOSPITAL_COMMUNITY)
Admission: AD | Admit: 2017-08-09 | Discharge: 2017-08-09 | Disposition: A | Discharge status: Home / Self Care | Payer: Medicaid Other | Source: Ambulatory Visit | Attending: Family Medicine | Admitting: Family Medicine

## 2017-08-09 ENCOUNTER — Encounter (HOSPITAL_COMMUNITY): Payer: Self-pay

## 2017-08-09 DIAGNOSIS — O479 False labor, unspecified: Secondary | ICD-10-CM

## 2017-08-09 DIAGNOSIS — Z3A38 38 weeks gestation of pregnancy: Secondary | ICD-10-CM | POA: Diagnosis not present

## 2017-08-09 DIAGNOSIS — O471 False labor at or after 37 completed weeks of gestation: Secondary | ICD-10-CM | POA: Diagnosis not present

## 2017-08-09 NOTE — Discharge Instructions (Signed)

## 2017-08-09 NOTE — MAU Note (Signed)
Urine in lab 

## 2017-08-09 NOTE — MAU Note (Addendum)
Patient presents with contractions, back pain, with increased vaginal discharge.  Contractions have been for a couple of days.

## 2017-08-12 ENCOUNTER — Inpatient Hospital Stay (HOSPITAL_COMMUNITY)
Admission: AD | Admit: 2017-08-12 | Discharge: 2017-08-12 | Disposition: A | Discharge status: Home / Self Care | Payer: Medicaid Other | Source: Ambulatory Visit | Attending: Family Medicine | Admitting: Family Medicine

## 2017-08-12 DIAGNOSIS — Z3A38 38 weeks gestation of pregnancy: Secondary | ICD-10-CM | POA: Insufficient documentation

## 2017-08-12 DIAGNOSIS — Z8249 Family history of ischemic heart disease and other diseases of the circulatory system: Secondary | ICD-10-CM | POA: Insufficient documentation

## 2017-08-12 DIAGNOSIS — Z808 Family history of malignant neoplasm of other organs or systems: Secondary | ICD-10-CM | POA: Diagnosis not present

## 2017-08-12 DIAGNOSIS — O99513 Diseases of the respiratory system complicating pregnancy, third trimester: Secondary | ICD-10-CM | POA: Diagnosis not present

## 2017-08-12 DIAGNOSIS — Z79899 Other long term (current) drug therapy: Secondary | ICD-10-CM | POA: Insufficient documentation

## 2017-08-12 DIAGNOSIS — Z818 Family history of other mental and behavioral disorders: Secondary | ICD-10-CM | POA: Diagnosis not present

## 2017-08-12 DIAGNOSIS — Z8261 Family history of arthritis: Secondary | ICD-10-CM | POA: Diagnosis not present

## 2017-08-12 DIAGNOSIS — O471 False labor at or after 37 completed weeks of gestation: Secondary | ICD-10-CM | POA: Diagnosis not present

## 2017-08-12 DIAGNOSIS — R109 Unspecified abdominal pain: Secondary | ICD-10-CM | POA: Diagnosis present

## 2017-08-12 DIAGNOSIS — Z87891 Personal history of nicotine dependence: Secondary | ICD-10-CM | POA: Diagnosis not present

## 2017-08-12 DIAGNOSIS — O479 False labor, unspecified: Secondary | ICD-10-CM

## 2017-08-12 DIAGNOSIS — J45909 Unspecified asthma, uncomplicated: Secondary | ICD-10-CM | POA: Insufficient documentation

## 2017-08-12 DIAGNOSIS — Z3689 Encounter for other specified antenatal screening: Secondary | ICD-10-CM

## 2017-08-12 LAB — URINALYSIS, ROUTINE W REFLEX MICROSCOPIC
Bilirubin Urine: NEGATIVE
GLUCOSE, UA: NEGATIVE mg/dL
Hgb urine dipstick: NEGATIVE
KETONES UR: 5 mg/dL — AB
Nitrite: NEGATIVE
PH: 6 (ref 5.0–8.0)
Protein, ur: NEGATIVE mg/dL
Specific Gravity, Urine: 1.021 (ref 1.005–1.030)

## 2017-08-12 NOTE — MAU Provider Note (Signed)
History     CSN: [id number]  Arrival date and time: 08/12/17 1128   First Provider Initiated Contact with Patient 08/12/17 1312      Chief Complaint  Patient presents with  . Contractions   HPI Peggy [name] is a 26 y.o. [id number] at [redacted]w[redacted]d who presents with contractions. Reports irregular contractions today that she rates 4/10. Also has seen bloody mucous since Saturday. Denies LOF, vaginal bleeding, or n/v. Had 1 episode of watery stool this morning. Diarrhea has not continued. Positive fetal movement.   OB History    Gravida Para Term Preterm AB Living   3 2 2  0 0 2   SAB TAB Ectopic Multiple Live Births   0 0 0 0 2      Past Medical History:  Diagnosis Date  . Asthma   . Pyloric stenosis     Past Surgical History:  Procedure Laterality Date  . PYLOROMYOTOMY      Family History  Problem Relation Age of Onset  . Arthritis Mother   . Cancer Mother        thyroid  . Depression Mother   . Hyperlipidemia Father   . Asthma Father   . Hypertension Father   . Heart disease Father   . COPD Maternal Grandfather   . Diabetes Paternal Grandmother   . Arthritis Paternal Grandmother   . Asthma Paternal Grandmother   . Heart disease Paternal Grandmother   . Depression Paternal Grandmother   . Hypertension Paternal Grandmother   . Hyperlipidemia Paternal Grandmother   . Other Neg Hx     Social History  Substance Use Topics  . Smoking status: Former Smoker    Packs/day: 0.50    Years: 5.00    Types: Cigarettes  . Smokeless tobacco: Never Used     Comment: 4 cig/day  . Alcohol use No    Allergies:  Allergies  Allergen Reactions  . No Known Allergies     Prescriptions Prior to Admission  Medication Sig Dispense Refill Last Dose  . budesonide-formoterol (SYMBICORT) 160-4.5 MCG/ACT inhaler Inhale 2 puffs into the lungs 2 (two) times daily. 3 Inhaler 0 Past Week at Unknown time  . PROAIR HFA 108 (90 Base) MCG/ACT inhaler INHALE 1-2 PUFFS INTO THE LUNGS  EVERY 4 (FOUR) HOURS AS NEEDED FOR WHEEZING OR SHORTNESS OF BREATH. 8.5 Inhaler 1 08/11/2017 at Unknown time  . ACCU-CHEK FASTCLIX LANCETS MISC TEST IN THE MORNING AS FASTING AND 2 HOURS AFTER EACH MEAL  15 Taking  . ACCU-CHEK SMARTVIEW test strip TEST IN THE MORNING FASTING AND 2 HOURS AFTER EACH MEAL  15 Taking  . acetaminophen (TYLENOL) 325 MG tablet Take 650 mg by mouth every 6 (six) hours as needed for mild pain or headache.   08/10/2017  . Blood Glucose Monitoring Suppl DEVI Accu check meter, accu lancets #100-prn refill, accu test strips #100-prn refills. glucose levels AM fasting and 2Hrs. After each meal 1 each prn Taking  . Prenatal Vit-Fe Phos-FA-Omega (VITAFOL GUMMIES) 3.33-0.333-34.8 MG CHEW Chew 3 tablets by mouth at bedtime. 90 tablet 12 08/10/2017  . promethazine (PHENERGAN) 25 MG tablet Take 1 tablet (25 mg total) by mouth every 6 (six) hours as needed for nausea or vomiting. 30 tablet 2 08/10/2017  . ranitidine (ZANTAC) 150 MG tablet Take 1 tablet (150 mg total) by mouth 2 (two) times daily. (Patient taking differently: Take 150 mg by mouth 2 (two) times daily as needed for heartburn. ) 60 tablet 5 08/09/2017  . Vitamin  D, Ergocalciferol, (DRISDOL) 50000 units CAPS capsule Take 1 capsule (50,000 Units total) by mouth every 7 (seven) days. 30 capsule 2 08/07/2017    Review of Systems  Constitutional: Negative.   Gastrointestinal: Positive for abdominal pain and diarrhea (x1 episode this morning). Negative for nausea and vomiting.  Genitourinary: Positive for vaginal discharge.   Physical Exam  Dilation: 3 Effacement (%): 40, 50 Cervical Position: Posterior Station: Ballotable Exam by:: Millner, RN    Blood pressure 123/71, pulse 80, temperature 98.6 F (37 C), resp. rate 18, last menstrual period 11/10/2016, unknown if currently breastfeeding.  Physical Exam  Nursing note and vitals reviewed. Constitutional: She is oriented to person, place, and time. She appears well-developed  and well-nourished. No distress.  HENT:  Head: Normocephalic and atraumatic.  Respiratory: Effort normal. No respiratory distress.  Neurological: She is alert and oriented to person, place, and time.  Skin: She is not diaphoretic.  Psychiatric: She has a normal mood and affect. Her behavior is normal. Judgment and thought content normal.   Fetal Tracing:  Baseline: 135 Variability: moderate Accelerations: 15x15 Decelerations: none  Toco: Q3-10 mins MAU Course  Procedures Results for orders placed or performed during the hospital encounter of 08/12/17 (from the past 24 hour(s))  Urinalysis, Routine w reflex microscopic     Status: Abnormal   Collection Time: 08/12/17 12:10 PM  Result Value Ref Range   Color, Urine YELLOW YELLOW   APPearance CLEAR CLEAR   Specific Gravity, Urine 1.021 1.005 - 1.030   pH 6.0 5.0 - 8.0   Glucose, UA NEGATIVE NEGATIVE mg/dL   Hgb urine dipstick NEGATIVE NEGATIVE   Bilirubin Urine NEGATIVE NEGATIVE   Ketones, ur 5 (A) NEGATIVE mg/dL   Protein, ur NEGATIVE NEGATIVE mg/dL   Nitrite NEGATIVE NEGATIVE   Leukocytes, UA TRACE (A) NEGATIVE   RBC / HPF 0-5 0 - 5 RBC/hpf   WBC, UA 6-30 0 - 5 WBC/hpf   Bacteria, UA RARE (A) NONE SEEN   Squamous Epithelial / LPF 0-5 (A) NONE SEEN   Mucous PRESENT     MDM Reactive NST SVE unchanged while in MAU No blood noted on exam  Assessment and Plan  A: 1. False labor   2. NST (non-stress test) reactive    P: Discharge home Discussed reasons to return to MAU Keep f/u with OB tomorrow  Jorje Guild 08/12/2017, 1:16 PM

## 2017-08-12 NOTE — MAU Note (Signed)
+  contractions Irregular Rating them 4/10  Lost mucous plug on Saturday and has had bloody mucous like discharge since then.  Denies LOF.  +FM  States was 2cm/60% at last OB check last week

## 2017-08-12 NOTE — Discharge Instructions (Signed)

## 2017-08-12 NOTE — MAU Provider Note (Signed)
Pt. States, "I feel out of it,  I think something is going on, not really sure." EFM applied - 120s FHR; toco applied - abd. Soft.

## 2017-08-13 ENCOUNTER — Ambulatory Visit (INDEPENDENT_AMBULATORY_CARE_PROVIDER_SITE_OTHER): Payer: Medicaid Other | Admitting: Obstetrics & Gynecology

## 2017-08-13 VITALS — BP 129/81 | HR 82 | Wt 158.2 lb

## 2017-08-13 DIAGNOSIS — O24913 Unspecified diabetes mellitus in pregnancy, third trimester: Secondary | ICD-10-CM

## 2017-08-13 DIAGNOSIS — Z3480 Encounter for supervision of other normal pregnancy, unspecified trimester: Secondary | ICD-10-CM

## 2017-08-13 DIAGNOSIS — O24919 Unspecified diabetes mellitus in pregnancy, unspecified trimester: Secondary | ICD-10-CM

## 2017-08-13 DIAGNOSIS — Z3483 Encounter for supervision of other normal pregnancy, third trimester: Secondary | ICD-10-CM

## 2017-08-13 LAB — CULTURE, OB URINE

## 2017-08-13 NOTE — Progress Notes (Signed)
   PRENATAL VISIT NOTE  Subjective:  Peggy [name] is a 26 y.o. G3P2002 at [redacted]w[redacted]d being seen today for ongoing prenatal care.  She is currently monitored for the following issues for this low-risk pregnancy and has Smoker; Supervision of normal pregnancy, antepartum; Low vitamin D level; Asthma affecting pregnancy, antepartum; and Diabetes mellitus affecting pregnancy, antepartum on her problem list.  Patient reports occasional contractions.  Contractions: Irregular. Vag. Bleeding: None.  Movement: Present. Denies leaking of fluid.   The following portions of the patient's history were reviewed and updated as appropriate: allergies, current medications, past family history, past medical history, past social history, past surgical history and problem list. Problem list updated.  Objective:   Vitals:   08/13/17 0940  BP: 129/81  Pulse: 82  Weight: 158 lb 3.2 oz (71.8 kg)    Fetal Status: Fetal Heart Rate (bpm): 131   Movement: Present     General:  Alert, oriented and cooperative. Patient is in no acute distress.  Skin: Skin is warm and dry. No rash noted.   Cardiovascular: Normal heart rate noted  Respiratory: Normal respiratory effort, no problems with respiration noted  Abdomen: Soft, gravid, appropriate for gestational age.  Pain/Pressure: Present     Pelvic: Cervical exam performed        Extremities: Normal range of motion.  Edema: None  Mental Status:  Normal mood and affect. Normal behavior. Normal judgment and thought content.   Assessment and Plan:  Pregnancy: G3P2002 at [redacted]w[redacted]d  1. Supervision of other normal pregnancy, antepartum No cervical change  2. Diabetes mellitus affecting pregnancy, antepartum Reports normal BG, IOL 40 weeks  Term labor symptoms and general obstetric precautions including but not limited to vaginal bleeding, contractions, leaking of fluid and fetal movement were reviewed in detail with the patient. Please refer to After Visit Summary for  other counseling recommendations.  Return in about 1 week (around 08/20/2017).   Emeterio Reeve, MD

## 2017-08-13 NOTE — Progress Notes (Signed)
Pt does not have CBG log today. Pt wants cx checked again today. Pt seen yesterday at MAU she was 3cm, 40%.

## 2017-08-13 NOTE — Patient Instructions (Signed)
Vaginal Delivery Vaginal delivery means that you will give birth by pushing your baby out of your birth canal (vagina). A team of health care providers will help you before, during, and after vaginal delivery. Birth experiences are unique for every woman and every pregnancy, and birth experiences vary depending on where you choose to give birth. What should I do to prepare for my baby's birth? Before your baby is born, it is important to talk with your health care provider about:  Your labor and delivery preferences. These may include: ? Medicines that you may be given. ? How you will manage your pain. This might include non-medical pain relief techniques or injectable pain relief such as epidural analgesia. ? How you and your baby will be monitored during labor and delivery. ? Who may be in the labor and delivery room with you. ? Your feelings about surgical delivery of your baby (cesarean delivery, or C-section) if this becomes necessary. ? Your feelings about receiving donated blood through an IV tube (blood transfusion) if this becomes necessary.  Whether you are able: ? To take pictures or videos of the birth. ? To eat during labor and delivery. ? To move around, walk, or change positions during labor and delivery.  What to expect after your baby is born, such as: ? Whether delayed umbilical cord clamping and cutting is offered. ? Who will care for your baby right after birth. ? Medicines or tests that may be recommended for your baby. ? Whether breastfeeding is supported in your hospital or birth center. ? How long you will be in the hospital or birth center.  How any medical conditions you have may affect your baby or your labor and delivery experience.  To prepare for your baby's birth, you should also:  Attend all of your health care visits before delivery (prenatal visits) as recommended by your health care provider. This is important.  Prepare your home for your baby's  arrival. Make sure that you have: ? Diapers. ? Baby clothing. ? Feeding equipment. ? Safe sleeping arrangements for you and your baby.  Install a car seat in your vehicle. Have your car seat checked by a certified car seat installer to make sure that it is installed safely.  Think about who will help you with your new baby at home for at least the first several weeks after delivery.  What can I expect when I arrive at the birth center or hospital? Once you are in labor and have been admitted into the hospital or birth center, your health care provider may:  Review your pregnancy history and any concerns you have.  Insert an IV tube into one of your veins. This is used to give you fluids and medicines.  Check your blood pressure, pulse, temperature, and heart rate (vital signs).  Check whether your bag of water (amniotic sac) has broken (ruptured).  Talk with you about your birth plan and discuss pain control options.  Monitoring Your health care provider may monitor your contractions (uterine monitoring) and your baby's heart rate (fetal monitoring). You may need to be monitored:  Often, but not continuously (intermittently).  All the time or for long periods at a time (continuously). Continuous monitoring may be needed if: ? You are taking certain medicines, such as medicine to relieve pain or make your contractions stronger. ? You have pregnancy or labor complications.  Monitoring may be done by:  Placing a special stethoscope or a handheld monitoring device on your abdomen to   check your baby's heartbeat, and feeling your abdomen for contractions. This method of monitoring does not continuously record your baby's heartbeat or your contractions.  Placing monitors on your abdomen (external monitors) to record your baby's heartbeat and the frequency and length of contractions. You may not have to wear external monitors all the time.  Placing monitors inside of your uterus  (internal monitors) to record your baby's heartbeat and the frequency, length, and strength of your contractions. ? Your health care provider may use internal monitors if he or she needs more information about the strength of your contractions or your baby's heart rate. ? Internal monitors are put in place by passing a thin, flexible wire through your vagina and into your uterus. Depending on the type of monitor, it may remain in your uterus or on your baby's head until birth. ? Your health care provider will discuss the benefits and risks of internal monitoring with you and will ask for your permission before inserting the monitors.  Telemetry. This is a type of continuous monitoring that can be done with external or internal monitors. Instead of having to stay in bed, you are able to move around during telemetry. Ask your health care provider if telemetry is an option for you.  Physical exam Your health care provider may perform a physical exam. This may include:  Checking whether your baby is positioned: ? With the head toward your vagina (head-down). This is most common. ? With the head toward the top of your uterus (head-up or breech). If your baby is in a breech position, your health care provider may try to turn your baby to a head-down position so you can deliver vaginally. If it does not seem that your baby can be born vaginally, your provider may recommend surgery to deliver your baby. In rare cases, you may be able to deliver vaginally if your baby is head-up (breech delivery). ? Lying sideways (transverse). Babies that are lying sideways cannot be delivered vaginally.  Checking your cervix to determine: ? Whether it is thinning out (effacing). ? Whether it is opening up (dilating). ? How low your baby has moved into your birth canal.  What are the three stages of labor and delivery?  Normal labor and delivery is divided into the following three stages: Stage 1  Stage 1 is the  longest stage of labor, and it can last for hours or days. Stage 1 includes: ? Early labor. This is when contractions may be irregular, or regular and mild. Generally, early labor contractions are more than 10 minutes apart. ? Active labor. This is when contractions get longer, more regular, more frequent, and more intense. ? The transition phase. This is when contractions happen very close together, are very intense, and may last longer than during any other part of labor.  Contractions generally feel mild, infrequent, and irregular at first. They get stronger, more frequent (about every 2-3 minutes), and more regular as you progress from early labor through active labor and transition.  Many women progress through stage 1 naturally, but you may need help to continue making progress. If this happens, your health care provider may talk with you about: ? Rupturing your amniotic sac if it has not ruptured yet. ? Giving you medicine to help make your contractions stronger and more frequent.  Stage 1 ends when your cervix is completely dilated to 4 inches (10 cm) and completely effaced. This happens at the end of the transition phase. Stage 2  Once   your cervix is completely effaced and dilated to 4 inches (10 cm), you may start to feel an urge to push. It is common for the body to naturally take a rest before feeling the urge to push, especially if you received an epidural or certain other pain medicines. This rest period may last for up to 1-2 hours, depending on your unique labor experience.  During stage 2, contractions are generally less painful, because pushing helps relieve contraction pain. Instead of contraction pain, you may feel stretching and burning pain, especially when the widest part of your baby's head passes through the vaginal opening (crowning).  Your health care provider will closely monitor your pushing progress and your baby's progress through the vagina during stage 2.  Your  health care provider may massage the area of skin between your vaginal opening and anus (perineum) or apply warm compresses to your perineum. This helps it stretch as the baby's head starts to crown, which can help prevent perineal tearing. ? In some cases, an incision may be made in your perineum (episiotomy) to allow the baby to pass through the vaginal opening. An episiotomy helps to make the opening of the vagina larger to allow more room for the baby to fit through.  It is very important to breathe and focus so your health care provider can control the delivery of your baby's head. Your health care provider may have you decrease the intensity of your pushing, to help prevent perineal tearing.  After delivery of your baby's head, the shoulders and the rest of the body generally deliver very quickly and without difficulty.  Once your baby is delivered, the umbilical cord may be cut right away, or this may be delayed for 1-2 minutes, depending on your baby's health. This may vary among health care providers, hospitals, and birth centers.  If you and your baby are healthy enough, your baby may be placed on your chest or abdomen to help maintain the baby's temperature and to help you bond with each other. Some mothers and babies start breastfeeding at this time. Your health care team will dry your baby and help keep your baby warm during this time.  Your baby may need immediate care if he or she: ? Showed signs of distress during labor. ? Has a medical condition. ? Was born too early (prematurely). ? Had a bowel movement before birth (meconium). ? Shows signs of difficulty transitioning from being inside the uterus to being outside of the uterus. If you are planning to breastfeed, your health care team will help you begin a feeding. Stage 3  The third stage of labor starts immediately after the birth of your baby and ends after you deliver the placenta. The placenta is an organ that develops  during pregnancy to provide oxygen and nutrients to your baby in the womb.  Delivering the placenta may require some pushing, and you may have mild contractions. Breastfeeding can stimulate contractions to help you deliver the placenta.  After the placenta is delivered, your uterus should tighten (contract) and become firm. This helps to stop bleeding in your uterus. To help your uterus contract and to control bleeding, your health care provider may: ? Give you medicine by injection, through an IV tube, by mouth, or through your rectum (rectally). ? Massage your abdomen or perform a vaginal exam to remove any blood clots that are left in your uterus. ? Empty your bladder by placing a thin, flexible tube (catheter) into your bladder. ? Encourage   you to breastfeed your baby. After labor is over, you and your baby will be monitored closely to ensure that you are both healthy until you are ready to go home. Your health care team will teach you how to care for yourself and your baby. This information is not intended to replace advice given to you by your health care provider. Make sure you discuss any questions you have with your health care provider. Document Released: 09/24/2008 Document Revised: 07/05/2016 Document Reviewed: 12/31/2015 Elsevier Interactive Patient Education  2018 [employer]  

## 2017-08-14 ENCOUNTER — Inpatient Hospital Stay (HOSPITAL_COMMUNITY)
Admission: AD | Admit: 2017-08-14 | Discharge: 2017-08-17 | DRG: 775 | Disposition: A | Discharge status: Home / Self Care | Payer: Medicaid Other | Source: Ambulatory Visit | Attending: Family Medicine | Admitting: Family Medicine

## 2017-08-14 ENCOUNTER — Encounter (HOSPITAL_COMMUNITY): Payer: Self-pay

## 2017-08-14 ENCOUNTER — Encounter (HOSPITAL_COMMUNITY): Payer: Self-pay | Admitting: *Deleted

## 2017-08-14 ENCOUNTER — Telehealth (HOSPITAL_COMMUNITY): Payer: Self-pay | Admitting: *Deleted

## 2017-08-14 DIAGNOSIS — O24420 Gestational diabetes mellitus in childbirth, diet controlled: Principal | ICD-10-CM | POA: Diagnosis present

## 2017-08-14 DIAGNOSIS — O99824 Streptococcus B carrier state complicating childbirth: Secondary | ICD-10-CM | POA: Diagnosis present

## 2017-08-14 DIAGNOSIS — Z3A38 38 weeks gestation of pregnancy: Secondary | ICD-10-CM

## 2017-08-14 DIAGNOSIS — Z87891 Personal history of nicotine dependence: Secondary | ICD-10-CM | POA: Diagnosis not present

## 2017-08-14 DIAGNOSIS — J45909 Unspecified asthma, uncomplicated: Secondary | ICD-10-CM | POA: Diagnosis present

## 2017-08-14 DIAGNOSIS — Z3493 Encounter for supervision of normal pregnancy, unspecified, third trimester: Secondary | ICD-10-CM | POA: Diagnosis present

## 2017-08-14 DIAGNOSIS — O9952 Diseases of the respiratory system complicating childbirth: Secondary | ICD-10-CM | POA: Diagnosis present

## 2017-08-14 LAB — CBC
HEMATOCRIT: 34.3 % — AB (ref 36.0–46.0)
Hemoglobin: 11.4 g/dL — ABNORMAL LOW (ref 12.0–15.0)
MCH: 28.1 pg (ref 26.0–34.0)
MCHC: 33.2 g/dL (ref 30.0–36.0)
MCV: 84.5 fL (ref 78.0–100.0)
PLATELETS: 330 10*3/uL (ref 150–400)
RBC: 4.06 MIL/uL (ref 3.87–5.11)
RDW: 14.2 % (ref 11.5–15.5)
WBC: 13.6 10*3/uL — AB (ref 4.0–10.5)

## 2017-08-14 MED ORDER — SOD CITRATE-CITRIC ACID 500-334 MG/5ML PO SOLN: 30.0000 mL | ORAL | Status: DC | PRN

## 2017-08-14 MED ORDER — OXYCODONE-ACETAMINOPHEN 5-325 MG PO TABS: 2.0000 | ORAL_TABLET | ORAL | Status: DC | PRN

## 2017-08-14 MED ORDER — LACTATED RINGERS IV SOLN
INTRAVENOUS | Status: DC
Start: 2017-08-14 — End: 2017-08-15

## 2017-08-14 MED ORDER — PENICILLIN G POTASSIUM 5000000 UNITS IJ SOLR
5.0000 10*6.[IU] | Freq: Once | INTRAVENOUS | Status: AC
  Administered 2017-08-15: 5 10*6.[IU] via INTRAVENOUS
  Filled 2017-08-14: qty 5

## 2017-08-14 MED ORDER — OXYTOCIN 40 UNITS IN LACTATED RINGERS INFUSION - SIMPLE MED
2.5000 [IU]/h | INTRAVENOUS | Status: DC
  Administered 2017-08-15: 2.5 [IU]/h via INTRAVENOUS
  Filled 2017-08-14: qty 1000

## 2017-08-14 MED ORDER — DIPHENHYDRAMINE HCL 50 MG/ML IJ SOLN: 12.5000 mg | INTRAMUSCULAR | Status: DC | PRN

## 2017-08-14 MED ORDER — LACTATED RINGERS IV SOLN: 500.0000 mL | Freq: Once | INTRAVENOUS | Status: DC

## 2017-08-14 MED ORDER — EPHEDRINE 5 MG/ML INJ
10.0000 mg | INTRAVENOUS | Status: DC | PRN
  Filled 2017-08-14: qty 2

## 2017-08-14 MED ORDER — FLEET ENEMA 7-19 GM/118ML RE ENEM: 1.0000 | ENEMA | RECTAL | Status: DC | PRN

## 2017-08-14 MED ORDER — FENTANYL 2.5 MCG/ML BUPIVACAINE 1/10 % EPIDURAL INFUSION (WH - ANES)
14.0000 mL/h | INTRAMUSCULAR | Status: DC | PRN
  Administered 2017-08-15: 14 mL/h via EPIDURAL
  Filled 2017-08-14: qty 100

## 2017-08-14 MED ORDER — ACETAMINOPHEN 325 MG PO TABS: 650.0000 mg | ORAL_TABLET | ORAL | Status: DC | PRN

## 2017-08-14 MED ORDER — OXYTOCIN BOLUS FROM INFUSION
500.0000 mL | Freq: Once | INTRAVENOUS | Status: AC
  Administered 2017-08-15: 500 mL via INTRAVENOUS

## 2017-08-14 MED ORDER — LIDOCAINE HCL (PF) 1 % IJ SOLN
30.0000 mL | INTRAMUSCULAR | Status: DC | PRN
  Filled 2017-08-14: qty 30

## 2017-08-14 MED ORDER — LACTATED RINGERS IV SOLN: 500.0000 mL | INTRAVENOUS | Status: DC | PRN

## 2017-08-14 MED ORDER — FENTANYL CITRATE (PF) 100 MCG/2ML IJ SOLN
50.0000 ug | INTRAMUSCULAR | Status: DC | PRN
  Administered 2017-08-14: 50 ug via INTRAVENOUS
  Filled 2017-08-14: qty 2

## 2017-08-14 MED ORDER — OXYCODONE-ACETAMINOPHEN 5-325 MG PO TABS: 1.0000 | ORAL_TABLET | ORAL | Status: DC | PRN

## 2017-08-14 MED ORDER — PENICILLIN G POT IN DEXTROSE 60000 UNIT/ML IV SOLN
3.0000 10*6.[IU] | INTRAVENOUS | Status: DC
  Filled 2017-08-14 (×3): qty 50

## 2017-08-14 MED ORDER — ONDANSETRON HCL 4 MG/2ML IJ SOLN: 4.0000 mg | Freq: Four times a day (QID) | INTRAMUSCULAR | Status: DC | PRN

## 2017-08-14 MED ORDER — PHENYLEPHRINE 40 MCG/ML (10ML) SYRINGE FOR IV PUSH (FOR BLOOD PRESSURE SUPPORT)
80.0000 ug | PREFILLED_SYRINGE | INTRAVENOUS | Status: DC | PRN
  Filled 2017-08-14: qty 5

## 2017-08-14 MED ORDER — PHENYLEPHRINE 40 MCG/ML (10ML) SYRINGE FOR IV PUSH (FOR BLOOD PRESSURE SUPPORT)
80.0000 ug | PREFILLED_SYRINGE | INTRAVENOUS | Status: DC | PRN
  Filled 2017-08-14: qty 5
  Filled 2017-08-14: qty 10

## 2017-08-14 NOTE — Telephone Encounter (Signed)
Preadmission screen  

## 2017-08-14 NOTE — H&P (Addendum)
Peggy [name] is an 26 y.o. [phone] [redacted]w[redacted]d female.   Chief Complaint: contractions all day HPI: started having contractions earlier today. Worse in last few hours. Contractions are q 2-4 mins. Was 3 in the office. PNC at [facility]. Has diet controlled GDM. GBS positive. Denies LOF. Has small amount of spotting. Reports good FM  Past Medical History:  Diagnosis Date  . Asthma   . Gestational diabetes   . Pyloric stenosis     Past Surgical History:  Procedure Laterality Date  . PYLOROMYOTOMY      Family History  Problem Relation Age of Onset  . Arthritis Mother   . Cancer Mother        thyroid  . Depression Mother   . Hyperlipidemia Father   . Asthma Father   . Hypertension Father   . Heart disease Father   . COPD Maternal Grandfather   . Diabetes Paternal Grandmother   . Arthritis Paternal Grandmother   . Asthma Paternal Grandmother   . Heart disease Paternal Grandmother   . Depression Paternal Grandmother   . Hypertension Paternal Grandmother   . Hyperlipidemia Paternal Grandmother   . Other Neg Hx    Social History:  reports that she has quit smoking. Her smoking use included Cigarettes. She has a 2.50 pack-year smoking history. She has never used smokeless tobacco. She reports that she does not drink alcohol or use drugs.    Allergies  Allergen Reactions  . No Known Allergies     No current facility-administered medications on file prior to encounter.    Current Outpatient Prescriptions on File Prior to Encounter  Medication Sig Dispense Refill  . ACCU-CHEK FASTCLIX LANCETS MISC TEST IN THE MORNING AS FASTING AND 2 HOURS AFTER EACH MEAL  15  . ACCU-CHEK SMARTVIEW test strip TEST IN THE MORNING FASTING AND 2 HOURS AFTER EACH MEAL  15  . acetaminophen (TYLENOL) 325 MG tablet Take 650 mg by mouth every 6 (six) hours as needed for mild pain or headache.    . Blood Glucose Monitoring Suppl DEVI Accu check meter, accu lancets #100-prn refill, accu test strips  #100-prn refills. glucose levels AM fasting and 2Hrs. After each meal 1 each prn  . budesonide-formoterol (SYMBICORT) 160-4.5 MCG/ACT inhaler Inhale 2 puffs into the lungs 2 (two) times daily. (Patient not taking: Reported on 08/13/2017) 3 Inhaler 0  . Prenatal Vit-Fe Phos-FA-Omega (VITAFOL GUMMIES) 3.33-0.333-34.8 MG CHEW Chew 3 tablets by mouth at bedtime. 90 tablet 12  . PROAIR HFA 108 (90 Base) MCG/ACT inhaler INHALE 1-2 PUFFS INTO THE LUNGS EVERY 4 (FOUR) HOURS AS NEEDED FOR WHEEZING OR SHORTNESS OF BREATH. (Patient not taking: Reported on 08/13/2017) 8.5 Inhaler 1  . promethazine (PHENERGAN) 25 MG tablet Take 1 tablet (25 mg total) by mouth every 6 (six) hours as needed for nausea or vomiting. (Patient not taking: Reported on 08/13/2017) 30 tablet 2  . ranitidine (ZANTAC) 150 MG tablet Take 1 tablet (150 mg total) by mouth 2 (two) times daily. (Patient taking differently: Take 150 mg by mouth 2 (two) times daily as needed for heartburn. ) 60 tablet 5  . Vitamin D, Ergocalciferol, (DRISDOL) 50000 units CAPS capsule Take 1 capsule (50,000 Units total) by mouth every 7 (seven) days. 30 capsule 2    A comprehensive review of systems was negative.  Last menstrual period 11/10/2016, unknown if currently breastfeeding. LMP 11/10/2016 (Exact Date)  General appearance: alert, cooperative and appears stated age Head: Normocephalic, without obvious abnormality, atraumatic Neck: supple, symmetrical,  trachea midline Lungs: normal effort Heart: regular rate and rhythm Abdomen: gravid, NT Extremities: Homans sign is negative, no sign of DVT Skin: Skin color, texture, turgor normal. No rashes or lesions Neurologic: Grossly normal   VE: 5-6/100/-2 NST:  Baseline: 150 bpm, Variability: Good {> 6 bpm), Accelerations: Reactive and Decelerations: Absent    U/s 8/8 6 lb 1 oz, vtx Lab Results  Component Value Date   WBC 14.1 (H) 07/17/2017   HGB 10.4 (L) 07/17/2017   HCT 31.7 (L) 07/17/2017   MCV 85.2  07/17/2017   PLT 272 07/17/2017         ABO, Rh: --/--/A POS (03/29 2326)  Antibody: NEG (03/29 2326)  Rubella: !Error!  RPR: Non Reactive (06/04 1115)  HBsAg: Negative (02/08 1253)  HIV:    GBS: Positive (08/01 1031)     Assessment/Plan Active labor at term GBS positive GDM diet controlled.  Admit PCN CBGs and glucose control prn Does not want epidural   Peggy [name] 08/14/2017, 11:04 PM

## 2017-08-14 NOTE — Progress Notes (Signed)
Pt to room 6 via w/c. Dr Kennon Rounds followed pt into room. Monitors applied/assessing. SVE by MD. Pt denies problems with this pregnancy except for GBS+. Pt states baby is moving normally. Pt denies bleeding and leaking of fluid. Admission orders received from MD.

## 2017-08-14 NOTE — Progress Notes (Signed)
Pt to L&D via w/c accompanied by RN. SBARR to [employer].

## 2017-08-15 ENCOUNTER — Inpatient Hospital Stay (HOSPITAL_COMMUNITY): Payer: Medicaid Other | Admitting: Anesthesiology

## 2017-08-15 ENCOUNTER — Encounter (HOSPITAL_COMMUNITY): Payer: Self-pay

## 2017-08-15 DIAGNOSIS — O24419 Gestational diabetes mellitus in pregnancy, unspecified control: Secondary | ICD-10-CM

## 2017-08-15 DIAGNOSIS — O24420 Gestational diabetes mellitus in childbirth, diet controlled: Secondary | ICD-10-CM

## 2017-08-15 DIAGNOSIS — O99824 Streptococcus B carrier state complicating childbirth: Secondary | ICD-10-CM

## 2017-08-15 DIAGNOSIS — Z3A38 38 weeks gestation of pregnancy: Secondary | ICD-10-CM

## 2017-08-15 LAB — TYPE AND SCREEN
ABO/RH(D): A POS
ANTIBODY SCREEN: NEGATIVE

## 2017-08-15 LAB — GLUCOSE, CAPILLARY: GLUCOSE-CAPILLARY: 116 mg/dL — AB (ref 65–99)

## 2017-08-15 LAB — RPR: RPR: NONREACTIVE

## 2017-08-15 MED ORDER — METHYLERGONOVINE MALEATE 0.2 MG/ML IJ SOLN: 0.2000 mg | INTRAMUSCULAR | Status: DC | PRN

## 2017-08-15 MED ORDER — DOCUSATE SODIUM 100 MG PO CAPS
100.0000 mg | ORAL_CAPSULE | Freq: Two times a day (BID) | ORAL | Status: DC
  Administered 2017-08-15 – 2017-08-17 (×4): 100 mg via ORAL
  Filled 2017-08-15 (×4): qty 1

## 2017-08-15 MED ORDER — OXYCODONE HCL 5 MG PO TABS
5.0000 mg | ORAL_TABLET | ORAL | Status: DC | PRN
  Administered 2017-08-15 – 2017-08-17 (×4): 5 mg via ORAL
  Filled 2017-08-15 (×4): qty 1

## 2017-08-15 MED ORDER — DIPHENHYDRAMINE HCL 25 MG PO CAPS: 25.0000 mg | ORAL_CAPSULE | Freq: Four times a day (QID) | ORAL | Status: DC | PRN

## 2017-08-15 MED ORDER — FLEET ENEMA 7-19 GM/118ML RE ENEM: 1.0000 | ENEMA | Freq: Every day | RECTAL | Status: DC | PRN

## 2017-08-15 MED ORDER — MEASLES, MUMPS & RUBELLA VAC ~~LOC~~ INJ
0.5000 mL | INJECTION | Freq: Once | SUBCUTANEOUS | Status: DC
  Filled 2017-08-15: qty 0.5

## 2017-08-15 MED ORDER — IBUPROFEN 600 MG PO TABS
600.0000 mg | ORAL_TABLET | Freq: Four times a day (QID) | ORAL | Status: DC
  Administered 2017-08-15 – 2017-08-17 (×10): 600 mg via ORAL
  Filled 2017-08-15 (×10): qty 1

## 2017-08-15 MED ORDER — ZOLPIDEM TARTRATE 5 MG PO TABS: 5.0000 mg | ORAL_TABLET | Freq: Every evening | ORAL | Status: DC | PRN

## 2017-08-15 MED ORDER — BENZOCAINE-MENTHOL 20-0.5 % EX AERO: 1.0000 "application " | INHALATION_SPRAY | CUTANEOUS | Status: DC | PRN

## 2017-08-15 MED ORDER — ONDANSETRON HCL 4 MG/2ML IJ SOLN: 4.0000 mg | INTRAMUSCULAR | Status: DC | PRN

## 2017-08-15 MED ORDER — FERROUS SULFATE 325 (65 FE) MG PO TABS
325.0000 mg | ORAL_TABLET | Freq: Two times a day (BID) | ORAL | Status: DC
  Administered 2017-08-15 – 2017-08-17 (×4): 325 mg via ORAL
  Filled 2017-08-15 (×4): qty 1

## 2017-08-15 MED ORDER — LIDOCAINE HCL (PF) 1 % IJ SOLN
INTRAMUSCULAR | Status: DC | PRN
  Administered 2017-08-15 (×2): 5 mL

## 2017-08-15 MED ORDER — DIBUCAINE 1 % RE OINT: 1.0000 "application " | TOPICAL_OINTMENT | RECTAL | Status: DC | PRN

## 2017-08-15 MED ORDER — SIMETHICONE 80 MG PO CHEW: 80.0000 mg | CHEWABLE_TABLET | ORAL | Status: DC | PRN

## 2017-08-15 MED ORDER — PRENATAL MULTIVITAMIN CH
1.0000 | ORAL_TABLET | Freq: Every day | ORAL | Status: DC
  Administered 2017-08-15 – 2017-08-17 (×3): 1 via ORAL
  Filled 2017-08-15 (×3): qty 1

## 2017-08-15 MED ORDER — OXYCODONE HCL 5 MG PO TABS
10.0000 mg | ORAL_TABLET | ORAL | Status: DC | PRN
Start: 2017-08-15 — End: 2017-08-17

## 2017-08-15 MED ORDER — ONDANSETRON HCL 4 MG PO TABS: 4.0000 mg | ORAL_TABLET | ORAL | Status: DC | PRN

## 2017-08-15 MED ORDER — BISACODYL 10 MG RE SUPP: 10.0000 mg | Freq: Every day | RECTAL | Status: DC | PRN

## 2017-08-15 MED ORDER — ACETAMINOPHEN 325 MG PO TABS
650.0000 mg | ORAL_TABLET | ORAL | Status: DC | PRN
  Administered 2017-08-15 – 2017-08-17 (×4): 650 mg via ORAL
  Filled 2017-08-15 (×4): qty 2

## 2017-08-15 MED ORDER — WITCH HAZEL-GLYCERIN EX PADS: 1.0000 "application " | MEDICATED_PAD | CUTANEOUS | Status: DC | PRN

## 2017-08-15 MED ORDER — METHYLERGONOVINE MALEATE 0.2 MG PO TABS: 0.2000 mg | ORAL_TABLET | ORAL | Status: DC | PRN

## 2017-08-15 MED ORDER — COCONUT OIL OIL
1.0000 "application " | TOPICAL_OIL | Status: DC | PRN
  Administered 2017-08-15: 1 via TOPICAL
  Filled 2017-08-15: qty 120

## 2017-08-15 MED ORDER — TETANUS-DIPHTH-ACELL PERTUSSIS 5-2.5-18.5 LF-MCG/0.5 IM SUSP: 0.5000 mL | Freq: Once | INTRAMUSCULAR | Status: DC

## 2017-08-15 NOTE — Anesthesia Preprocedure Evaluation (Signed)
Anesthesia Evaluation  Patient identified by MRN, date of birth, ID band Patient awake    Reviewed: Allergy & Precautions, H&P , NPO status , Patient's Chart, lab work & pertinent test results, reviewed documented beta blocker date and time   Airway Mallampati: II  TM Distance: >3 FB Neck ROM: full    Dental no notable dental hx.    Pulmonary neg pulmonary ROS, Current Smoker,    Pulmonary exam normal breath sounds clear to auscultation       Cardiovascular negative cardio ROS Normal cardiovascular exam Rhythm:regular Rate:Normal     Neuro/Psych negative neurological ROS  negative psych ROS   GI/Hepatic negative GI ROS, Neg liver ROS,   Endo/Other  negative endocrine ROSdiabetes, Gestational  Renal/GU negative Renal ROS  negative genitourinary   Musculoskeletal   Abdominal   Peds  Hematology negative hematology ROS (+)   Anesthesia Other Findings   Reproductive/Obstetrics (+) Pregnancy                             Anesthesia Physical Anesthesia Plan  ASA: III  Anesthesia Plan: Epidural   Post-op Pain Management:    Induction:   PONV Risk Score and Plan:   Airway Management Planned:   Additional Equipment:   Intra-op Plan:   Post-operative Plan:   Informed Consent: I have reviewed the patients History and Physical, chart, labs and discussed the procedure including the risks, benefits and alternatives for the proposed anesthesia with the patient or authorized representative who has indicated his/her understanding and acceptance.     Plan Discussed with:   Anesthesia Plan Comments:         Anesthesia Quick Evaluation

## 2017-08-15 NOTE — Anesthesia Procedure Notes (Signed)
Epidural Patient location during procedure: OB Start time: 08/15/2017 12:00 AM End time: 08/15/2017 12:15 AM  Staffing Anesthesiologist: Nesta Kimple  Preanesthetic Checklist Completed: patient identified, site marked, surgical consent, pre-op evaluation, timeout performed, IV checked, risks and benefits discussed and monitors and equipment checked  Epidural Patient position: sitting Prep: site prepped and draped and DuraPrep Patient monitoring: continuous pulse ox and blood pressure Approach: midline Location: L4-L5 Injection technique: LOR air  Needle:  Needle type: Tuohy  Needle gauge: 17 G Needle length: 9 cm and 9 Needle insertion depth: 6 cm Catheter type: closed end flexible Catheter size: 19 Gauge Catheter at skin depth: 10 cm Test dose: negative  Assessment Events: blood not aspirated, injection not painful, no injection resistance, negative IV test and no paresthesia

## 2017-08-15 NOTE — Lactation Note (Signed)
This note was copied from a baby's chart. Lactation Consultation Note  Patient Name: Peggy [name] Today's Date: 08/15/2017 Reason for consult: Initial assessment;Early term 80-38.6wks  Visited with Peggy [name], baby 83 hrs old.  [name] has already breastfed twice.  Hand expression demonstrated, colostrum expressed easily.  Offered to assist with positioning and latching.   Baby placed STS prone on [name]'s chest.  [name] stated that baby was pulling up lower lip and pinching was felt.  Showed [name] how to sandwich up breast to facilitate deeper latch.  Baby became rhythmic and swallowing identified.  [name] stated latch was comfortable.   Encouraged STS and feeding often on cue.   Lactation brochure left with [name].  Informed her of IP and OP lactation resources that are available to her.   Encouraged to call for assistance as needed, and LC to follow up in am.  Feeding Feeding Type: Breast Fed  LATCH Score Latch: Grasps breast easily, tongue down, lips flanged, rhythmical sucking.  Audible Swallowing: Spontaneous and intermittent  Type of Nipple: Everted at rest and after stimulation (short nipple shaft)  Comfort (Breast/Nipple): Soft / non-tender  Hold (Positioning): Assistance needed to correctly position infant at breast and maintain latch.  LATCH Score: 9  Interventions Interventions: Breast feeding basics reviewed;Assisted with latch;Skin to skin;Breast massage;Hand express;Breast compression;Adjust position;Support pillows;Position options     Consult Status Consult Status: Follow-up Date: 08/16/17 Follow-up type: [place] 08/15/2017, 10:51 AM

## 2017-08-15 NOTE — Progress Notes (Signed)
UR chart review completed.  

## 2017-08-16 MED ORDER — PNEUMOCOCCAL VAC POLYVALENT 25 MCG/0.5ML IJ INJ
0.5000 mL | INJECTION | INTRAMUSCULAR | Status: AC
  Administered 2017-08-17: 0.5 mL via INTRAMUSCULAR
  Filled 2017-08-16: qty 0.5

## 2017-08-16 NOTE — Progress Notes (Signed)
Post Partum Day #1 Subjective: no complaints, up ad lib and tolerating PO  Objective: Blood pressure 110/69, pulse 65, temperature 98.7 F (37.1 C), temperature source Axillary, resp. rate 18, height 5' (1.524 m), weight 71.7 kg (158 lb), last menstrual period 11/10/2016, SpO2 98 %, unknown if currently breastfeeding.  Physical Exam:  General: alert, cooperative and no distress Lochia: appropriate Uterine Fundus: firm DVT Evaluation: No evidence of DVT seen on physical exam.  CBG: 116   Recent Labs  08/14/17 2332  HGB 11.4*  HCT 34.3*    Assessment/Plan: Plan for discharge tomorrow and Social Work consult (due to spousal situation w/ security removal) Will need glucola at [facility] visit   LOS: 2 days   Serita Grammes 08/16/2017, 8:51 AM

## 2017-08-16 NOTE — Clinical Social Work Maternal (Signed)
  CLINICAL SOCIAL WORK MATERNAL/CHILD NOTE  Patient Details  Name: Peggy [name] MRN: [id number] Date of Birth: 1991-03-20  Date:  08/16/2017  Clinical Social Worker Initiating Note:  Peggy Lango Ward, MSW, LCSW-A  Date/ Time Initiated:  08/16/17/1122     Child's Name:  Peggy [name]   Legal Guardian:  Other (Comment) (Not established by court system; MOB and FOB parent collectively )   Need for Interpreter:  None   Date of Referral:  08/15/17     Reason for Referral:  Other (Comment) (MOB and FOB had an argument)   Referral Source:  RN   Address:  [place] [address]  Phone number:  [phone]   Household Members:  Self, Minor Children, Spouse   Natural Supports (not living in the home):  Extended Family, Friends   Chiropodist: None   Employment: Animator   Type of Work: [employer] and FOB both employed full time; MOB is a Hotel manager and FOB is a Clinical biochemist Resources:  [employer]   Other Resources:  [employer], Physicist, medical    Cultural/Religious Considerations Which May Impact Care:  [employer] per Presenter, broadcasting   Strengths:  Ability to meet basic needs , Compliance with medical plan , Home prepared for child , Lexicographer chosen  ([place] )   Risk Factors/Current Problems:  Family/Relationship Issues    Cognitive State:  Able to Concentrate , Insightful , Alert , Goal Oriented    Mood/Affect:  Calm , Comfortable , Interested    CSW Assessment: CSW met with MOB at bedside to complete assessment for consult regarding an argument that she and FOB had previously. Upon this writers arrival, CSW explained role and reasoning for visit. Both FOB and MOB were warm and welcoming. This Probation officer inquired about both parents relationship and coping skills. Both MOB and FOB noted they are married and have a generally good marriage with good coping skills. Both acknowledge there  was an disagreement and FOB left; however, note doing better now. This Probation officer discussed positive coping skills with parents and how to communicate without escalation. Additionally, CSW reviewed PPD and PMAD. This Probation officer provided MOB with checklist for maternal health and encouraged her to self rate herself. MOB was thankful. This Probation officer discussed support groups offered here at the education center. FOB was intrigued by this and noted he wished they had groups for dad. This Probation officer informed FOB that these classes are not strictly for MOB's; however, that is usually who shows up but all are welcomed. FOB was thankful and both noted they will be utilizing them. FOB and MOB were both noted to be bonding well with baby. This Probation officer encouraged them to rest often while baby is resting and to help one another out being that they have 2 other children at home. MOB and FOB were thankful for this writers arrival. At this time, no other needs were addressed or requested. Case closed to this CSW.   CSW Plan/Description:  Information/Referral to [employer] , [employer] , No Further Intervention Required/No Barriers to Discharge    [employer], MSW, [facility]  Office: [phone]

## 2017-08-16 NOTE — Lactation Note (Signed)
This note was copied from a baby's chart. Lactation Consultation Note: Mother has tiny blisters that have scabbed over on the Rt nipple. Mother was given comfort gels. She has coocnut oil at the bedside. Mother reports that infant had difficulty flanging bottom lip. Mother was sat up with a DEBP by staff nurse. Mother pumped 5 ml and gave to infant along with 10 of formula the last feeding. Advised mother in properly applying #20 nipple shield. Suggested that mother page staff nurse or [facility] for the next feeding. Mother to continue to cue base feed and to pump after each feeding. Mother reports that her breast are filling heavy. She reports that she sees colostrum in the nipple shield after infant releases the breast. Mother denies any more concerns or questions.   Patient Name: Peggy [name] [id number] Date: 08/16/2017 Reason for consult: Follow-up assessment   Maternal Data    Feeding Nipple Type: Slow - flow Length of feed: 10 min  LATCH Score                   Interventions    Lactation Tools Discussed/Used     Consult Status Consult Status: Follow-up Date: 08/16/17 Follow-up type: In-patient    Jess Barters [facility] 08/16/2017, 3:35 PM

## 2017-08-17 MED ORDER — SIMETHICONE 80 MG PO CHEW: 80.0000 mg | CHEWABLE_TABLET | ORAL | 0 refills | Status: DC | PRN

## 2017-08-17 NOTE — Discharge Summary (Signed)
OB Discharge Summary     Patient Name: Peggy [name] DOB: 16-Oct-1991 MRN: [id number]  Date of admission: 08/14/2017 Delivering MD: Christin Fudge   Date of discharge: 08/17/2017  Admitting diagnosis: 52WKS CTX 2 TO 4 MINS Intrauterine pregnancy: [redacted]w[redacted]d     Secondary diagnosis:  Active Problems:   Normal labor  Additional problems: gestational diabetes diet controlled     Discharge diagnosis: Term Pregnancy Delivered                                                                                                Post partum procedures:none  Augmentation: none  Complications: None  Hospital course:  Onset of Labor With Vaginal Delivery     26 y.o. yo G3P3003 at [redacted]w[redacted]d was admitted in Active Labor on 08/14/2017. Patient had an uncomplicated labor course as follows:  Membrane Rupture Time/Date: 2:08 AM ,08/15/2017   Intrapartum Procedures: Episiotomy: None [1]                                         Lacerations:  None [1]  Patient had a delivery of a Viable infant. 08/15/2017  Information for the patient's newborn:  Meghin, Thivierge [[id number]]  Delivery Method: Vaginal, Spontaneous Delivery (Filed from Delivery Summary)    Pateint had an uncomplicated postpartum course.  She is ambulating, tolerating a regular diet, passing flatus, and urinating well. Patient is discharged home in stable condition on 08/17/17.   Physical exam  Vitals:   08/15/17 1110 08/15/17 1734 08/16/17 0508 08/16/17 1800  BP: (!) 115/55 117/65 110/69 125/68  Pulse: 79 71 65 78  Resp: 20 20 18 17   Temp: 98.6 F (37 C) 97.7 F (36.5 C) 98.7 F (37.1 C) 98 F (36.7 C)  TempSrc: Oral Oral Axillary Axillary  SpO2:  98%  99%  Weight:      Height:       General: alert, cooperative and no distress Lochia: appropriate Uterine Fundus: firm Incision: Healing well with no significant drainage DVT Evaluation: No evidence of DVT seen on physical exam. Labs: Lab Results  Component  Value Date   WBC 13.6 (H) 08/14/2017   HGB 11.4 (L) 08/14/2017   HCT 34.3 (L) 08/14/2017   MCV 84.5 08/14/2017   PLT 330 08/14/2017   CMP Latest Ref Rng & Units 07/17/2017  Glucose 65 - 99 mg/dL 81  BUN 6 - 20 mg/dL 5(L)  Creatinine 0.44 - 1.00 mg/dL 0.55  Sodium 135 - 145 mmol/L 134(L)  Potassium 3.5 - 5.1 mmol/L 3.4(L)  Chloride 101 - 111 mmol/L 106  CO2 22 - 32 mmol/L 18(L)  Calcium 8.9 - 10.3 mg/dL 8.4(L)  Total Protein 6.5 - 8.1 g/dL -  Total Bilirubin 0.3 - 1.2 mg/dL -  Alkaline Phos 38 - 126 U/L -  AST 15 - 41 U/L -  ALT 14 - 54 U/L -    Discharge instruction: per After Visit Summary and "Baby and Me Booklet".  After visit meds:  Allergies as  of 08/17/2017   No Known Allergies     Medication List    STOP taking these medications   ACCU-CHEK FASTCLIX LANCETS Misc   ACCU-CHEK SMARTVIEW test strip Generic drug:  glucose blood   Blood Glucose Monitoring Suppl Devi   promethazine 25 MG tablet Commonly known as:  PHENERGAN     TAKE these medications   acetaminophen 325 MG tablet Commonly known as:  TYLENOL Take 650 mg by mouth every 6 (six) hours as needed for mild pain or headache.   budesonide-formoterol 160-4.5 MCG/ACT inhaler Commonly known as:  SYMBICORT Inhale 2 puffs into the lungs 2 (two) times daily.   PROAIR HFA 108 (90 Base) MCG/ACT inhaler Generic drug:  albuterol INHALE 1-2 PUFFS INTO THE LUNGS EVERY 4 (FOUR) HOURS AS NEEDED FOR WHEEZING OR SHORTNESS OF BREATH.   ranitidine 150 MG tablet Commonly known as:  ZANTAC Take 1 tablet (150 mg total) by mouth 2 (two) times daily. What changed:  when to take this  reasons to take this   simethicone 80 MG chewable tablet Commonly known as:  MYLICON Chew 1 tablet (80 mg total) by mouth as needed for flatulence.   VITAFOL GUMMIES 3.33-0.333-34.8 MG Chew Chew 3 tablets by mouth at bedtime.   Vitamin D (Ergocalciferol) 50000 units Caps capsule Commonly known as:  DRISDOL Take 1 capsule (50,000  Units total) by mouth every 7 (seven) days.       Diet: routine diet  Activity: Advance as tolerated. Pelvic rest for 6 weeks.   Outpatient follow up:6 weeks  Patient will 2 hour gtt postpartum. Follow up Appt: Future Appointments Date Time Provider [place]  08/20/2017 9:30 AM Chancy Milroy, MD [place] None   Follow up Visit:No Follow-up on file.  Postpartum contraception: unsure  Newborn Data: Live born female  Birth Weight: 6 lb 4.5 oz (2850 g) APGAR: 9, 9  Baby Feeding: Breast Disposition:home with mother   08/17/2017 Starr [place], CNM

## 2017-08-17 NOTE — Lactation Note (Signed)
This note was copied from a baby's chart. Lactation Consultation Note: Mother reports that infant just finished a 15 min. feeding. Mother is not using the nipple shield. She is pumping after feeding and supplementing infant with ebm. Mother has approx 30 ml of ebm at the bedside. Mother reports that she has an electric pump and hand pump at home. Mother to continue to cue base feed and feed at least 8-12 times in 24 hours. Mother denies having any discomfort with feeding. Discussed prevention and treatment of engorgement. Mother is aware of available [place] services and community support.   Patient Name: [name] [id number] Date: 08/17/2017 Reason for consult: Follow-up assessment   Maternal Data    Feeding Feeding Type: Breast Fed Length of feed: 15 min  LATCH Score Latch: Grasps breast easily, tongue down, lips flanged, rhythmical sucking.  Audible Swallowing: A few with stimulation  Type of Nipple: Everted at rest and after stimulation  Comfort (Breast/Nipple): Filling, red/small blisters or bruises, mild/mod discomfort  Hold (Positioning): Assistance needed to correctly position infant at breast and maintain latch.  LATCH Score: 7  Interventions    Lactation Tools Discussed/Used     Consult Status Consult Status: Complete    Darla Lesches 08/17/2017, 12:01 PM

## 2017-08-18 NOTE — Anesthesia Postprocedure Evaluation (Signed)
Anesthesia Post Note  Patient: Peggy [name]  Procedure(s) Performed: * No procedures listed *     Patient location during evaluation: Mother Baby Anesthesia Type: Epidural Level of consciousness: awake and alert Pain management: pain level controlled Vital Signs Assessment: post-procedure vital signs reviewed and stable Respiratory status: spontaneous breathing, nonlabored ventilation and respiratory function stable Cardiovascular status: stable Postop Assessment: no headache, no backache and epidural receding Anesthetic complications: no    Last Vitals: There were no vitals filed for this visit.  Last Pain: There were no vitals filed for this visit.               Deborahann Poteat

## 2017-08-20 ENCOUNTER — Encounter: Payer: Medicaid Other | Admitting: Obstetrics and Gynecology

## 2017-08-23 ENCOUNTER — Inpatient Hospital Stay (HOSPITAL_COMMUNITY)
Admission: RE | Admit: 2017-08-23 | Payer: Medicaid Other | Source: Ambulatory Visit | Admitting: Obstetrics & Gynecology

## 2017-08-29 ENCOUNTER — Other Ambulatory Visit: Payer: Self-pay | Admitting: Certified Nurse Midwife

## 2017-08-29 DIAGNOSIS — O219 Vomiting of pregnancy, unspecified: Secondary | ICD-10-CM

## 2017-09-09 ENCOUNTER — Other Ambulatory Visit: Payer: Self-pay | Admitting: *Deleted

## 2017-09-09 NOTE — Progress Notes (Signed)
Orders called to pharmacy per R.Denney for breast. Pt states that baby was Dx with Thrush yesterday and pt is having very painful breast. Rx were called to [facility] in [place]. LM on pt VM of pharmacy information.

## 2017-09-11 ENCOUNTER — Ambulatory Visit: Payer: Medicaid Other | Admitting: Obstetrics and Gynecology

## 2017-09-18 ENCOUNTER — Other Ambulatory Visit: Payer: Self-pay | Admitting: Certified Nurse Midwife

## 2017-09-18 DIAGNOSIS — O219 Vomiting of pregnancy, unspecified: Secondary | ICD-10-CM

## 2017-09-24 ENCOUNTER — Ambulatory Visit (INDEPENDENT_AMBULATORY_CARE_PROVIDER_SITE_OTHER): Payer: Medicaid Other | Admitting: Obstetrics & Gynecology

## 2017-09-24 DIAGNOSIS — Z30018 Encounter for initial prescription of other contraceptives: Secondary | ICD-10-CM

## 2017-09-24 MED ORDER — ETONOGESTREL-ETHINYL ESTRADIOL 0.12-0.015 MG/24HR VA RING: VAGINAL_RING | VAGINAL | 12 refills | Status: DC

## 2017-09-24 NOTE — Patient Instructions (Signed)

## 2017-09-24 NOTE — Progress Notes (Signed)
Subjective:     Peggy [name] is a 26 y.o. female who presents for a postpartum visit. She is 4 weeks postpartum following a spontaneous vaginal delivery. I have fully reviewed the prenatal and intrapartum course. The delivery was at 38.5 gestational weeks. Outcome: spontaneous vaginal delivery. Anesthesia: epidural. Postpartum course has been good. Baby's course has been normal. Baby is feeding by breast. Bleeding no bleeding. Bowel function is normal. Bladder function is normal. Patient is sexually active. Contraception method is condoms. Postpartum depression screening: negative.  The following portions of the patient's history were reviewed and updated as appropriate: allergies, current medications, past family history, past medical history, past social history, past surgical history and problem list.  Review of Systems Pertinent items are noted in HPI.   Objective:    BP 110/75   Pulse (!) 103   Ht 5' (1.524 m)   Wt 141 lb (64 kg)   Breastfeeding? Yes   BMI 27.54 kg/m   General:  alert, cooperative and no distress   Breasts:  negative        Abdomen: not distended   Vulva:  not evaluated  Vagina: not evaluated   Assessment:     normal postpartum exam. Pap smear not done at today's visit.   Plan:    1. Contraception: NuvaRing vaginal inserts 2. Needs f/u 2 hr GTT 3. Follow up as needed.    Woodroe Mode, MD 09/24/2017

## 2017-09-24 NOTE — Progress Notes (Signed)
Post Partum Exam  Peggy [name] is a 26 y.o. G27P3003 female who presents for a postpartum visit. She is 6 weeks postpartum following a spontaneous vaginal delivery. I have fully reviewed the prenatal and intrapartum course. The delivery was at 38w6 gestational weeks.  Anesthesia: Epidural. Postpartum course has been unremarkable. Baby's course has been unremarkable. Baby is feeding by breast. Bleeding staining only. Bowel function is normal. Bladder function is normal. Patient is sexually active. Contraception method is none. Postpartum depression screening:neg  See completed note  Woodroe Mode, MD

## 2017-09-26 ENCOUNTER — Other Ambulatory Visit: Payer: Medicaid Other

## 2017-09-26 DIAGNOSIS — Z8632 Personal history of gestational diabetes: Secondary | ICD-10-CM

## 2017-09-27 LAB — GLUCOSE TOLERANCE, 2 HOURS W/ 1HR
GLUCOSE, 1 HOUR: 83 mg/dL (ref 65–179)
GLUCOSE, 2 HOUR: 91 mg/dL (ref 65–152)
GLUCOSE, FASTING: 71 mg/dL (ref 65–91)

## 2017-09-30 ENCOUNTER — Other Ambulatory Visit: Payer: Self-pay | Admitting: Certified Nurse Midwife

## 2017-09-30 DIAGNOSIS — O219 Vomiting of pregnancy, unspecified: Secondary | ICD-10-CM

## 2018-04-04 ENCOUNTER — Other Ambulatory Visit: Payer: Self-pay

## 2018-04-04 ENCOUNTER — Emergency Department (HOSPITAL_COMMUNITY)
Admission: EM | Admit: 2018-04-04 | Discharge: 2018-04-05 | Disposition: A | Discharge status: Home / Self Care | Payer: Self-pay | Attending: Emergency Medicine | Admitting: Emergency Medicine

## 2018-04-04 ENCOUNTER — Emergency Department (HOSPITAL_COMMUNITY): Payer: Self-pay

## 2018-04-04 ENCOUNTER — Encounter (HOSPITAL_COMMUNITY): Payer: Self-pay | Admitting: Student

## 2018-04-04 DIAGNOSIS — R072 Precordial pain: Secondary | ICD-10-CM | POA: Insufficient documentation

## 2018-04-04 DIAGNOSIS — Z79899 Other long term (current) drug therapy: Secondary | ICD-10-CM | POA: Insufficient documentation

## 2018-04-04 DIAGNOSIS — F17210 Nicotine dependence, cigarettes, uncomplicated: Secondary | ICD-10-CM | POA: Insufficient documentation

## 2018-04-04 MED ORDER — ACETAMINOPHEN 325 MG PO TABS
650.0000 mg | ORAL_TABLET | Freq: Once | ORAL | Status: AC
Start: 2018-04-04 — End: 2018-04-04
  Administered 2018-04-04: 650 mg via ORAL
  Filled 2018-04-04: qty 2

## 2018-04-04 NOTE — ED Provider Notes (Signed)
[facility] EMERGENCY DEPARTMENT Provider Note   CSN: [id number] Arrival date & time: 04/04/18  2038     History   Chief Complaint Chief Complaint  Patient presents with  . Chest Pain    HPI Peggy [name] is a 27 y.o. female.  HPI   Peggy [name] is a 27 y.o. female who presents to the Emergency Department complaining of right sided chest pain for one week.  Pain was intermittent initially, but she now describes the pain as constant and worse with pressure to her chest.  She states that she typically sleeps on her stomach and doing this makes her chest pain worse.  She describes the pain is dull, she denies cough, fever, chills, recent illness, shortness of breath or known injury.  She states that she has been told she has acid reflux and she tried taking a ranitidine tablet earlier this evening without relief.  She denies alcohol or drug use.  She is a smoker but denies any birth control medication at present.  Patient is currently breast-feeding.  No history of DVTs or PEs.  Past Medical History:  Diagnosis Date  . Asthma   . Gestational diabetes   . Pyloric stenosis     Patient Active Problem List   Diagnosis Date Noted  . Low vitamin D level 02/12/2017  . Smoker 07/25/2014    Past Surgical History:  Procedure Laterality Date  . PYLOROMYOTOMY       OB History    Gravida  3   Para  3   Term  3   Preterm  0   AB  0   Living  3     SAB  0   TAB  0   Ectopic  0   Multiple  0   Live Births  3            Home Medications    Prior to Admission medications   Medication Sig Start Date End Date Taking? Authorizing Provider  acetaminophen (TYLENOL) 325 MG tablet Take 650 mg by mouth every 6 (six) hours as needed for mild pain or headache.    [provider]  budesonide-formoterol (SYMBICORT) 160-4.5 MCG/ACT inhaler Inhale 2 puffs into the lungs 2 (two) times daily. Patient not taking: Reported on 09/24/2017 05/06/17   Collene Gobble, MD  etonogestrel-ethinyl estradiol (NUVARING) 0.12-0.015 MG/24HR vaginal ring Insert vaginally and leave in place for 3 consecutive weeks, then remove for 1 week. 09/24/17   Woodroe Mode, MD  Prenatal Vit-Fe Phos-FA-Omega (VITAFOL GUMMIES) 3.33-0.333-34.8 MG CHEW Chew 3 tablets by mouth at bedtime. 02/06/17   Kandis Cocking A, CNM  PROAIR HFA 108 (90 Base) MCG/ACT inhaler INHALE 1-2 PUFFS INTO THE LUNGS EVERY 4 (FOUR) HOURS AS NEEDED FOR WHEEZING OR SHORTNESS OF BREATH. Patient not taking: Reported on 09/24/2017 06/08/17   Marcille Buffy D, CNM  promethazine (PHENERGAN) 25 MG tablet TAKE 1 TABLET BY MOUTH EVERY 6 HOURS AS NEEDED FOR NAUSEA AND VOMITING 10/01/17   Shelly Bombard, MD  ranitidine (ZANTAC) 150 MG tablet Take 1 tablet (150 mg total) by mouth 2 (two) times daily. Patient not taking: Reported on 09/24/2017 02/06/17   Morene Crocker, CNM  simethicone (MYLICON) 80 MG chewable tablet Chew 1 tablet (80 mg total) by mouth as needed for flatulence. Patient not taking: Reported on 09/24/2017 08/17/17   Starr Lake, CNM  Vitamin D, Ergocalciferol, (DRISDOL) 50000 units CAPS capsule Take 1 capsule (50,000 Units total) by  mouth every 7 (seven) days. Patient not taking: Reported on 09/24/2017 02/12/17   Morene Crocker, CNM    Family History Family History  Problem Relation Age of Onset  . Arthritis Mother   . Cancer Mother        thyroid  . Depression Mother   . Hyperlipidemia Father   . Asthma Father   . Hypertension Father   . Heart disease Father   . COPD Maternal Grandfather   . Diabetes Paternal Grandmother   . Arthritis Paternal Grandmother   . Asthma Paternal Grandmother   . Heart disease Paternal Grandmother   . Depression Paternal Grandmother   . Hypertension Paternal Grandmother   . Hyperlipidemia Paternal Grandmother   . Other Neg Hx     Social History Social History   Tobacco Use  . Smoking status: Current Every Day Smoker    Packs/day: 0.25     Years: 5.00    Pack years: 1.25    Types: Cigarettes  . Smokeless tobacco: Never Used  . Tobacco comment: 2 cig/day  Substance Use Topics  . Alcohol use: No  . Drug use: No     Allergies   Patient has no known allergies.   Review of Systems Review of Systems  Constitutional: Negative for appetite change, chills and fever.  Respiratory: Negative for cough, chest tightness and shortness of breath.   Cardiovascular: Positive for chest pain (right sided chest pain). Negative for palpitations and leg swelling.  Gastrointestinal: Negative for abdominal pain, nausea and vomiting.  Genitourinary: Negative for dysuria and flank pain.  Musculoskeletal: Negative for arthralgias and back pain.  Skin: Negative for color change and rash.  Neurological: Negative for dizziness, weakness, numbness and headaches.     Physical Exam Updated Vital Signs BP 127/82 (BP Location: Right Arm)   Pulse 100   Temp 99 F (37.2 C) (Oral)   Resp 18   Ht 5' (1.524 m)   Wt 61.2 kg (135 lb)   LMP 03/04/2018   SpO2 99%   BMI 26.37 kg/m   Physical Exam  Constitutional: She is oriented to person, place, and time. She appears well-developed and well-nourished. No distress.  HENT:  Head: Atraumatic.  Mouth/Throat: Oropharynx is clear and moist.  Eyes: Pupils are equal, round, and reactive to light. EOM are normal.  Neck: Normal range of motion. Neck supple.  Cardiovascular: Normal rate, regular rhythm, normal heart sounds and intact distal pulses.  No murmur heard. Pulmonary/Chest: Effort normal and breath sounds normal. No respiratory distress. She exhibits tenderness (focal ttp of right upper chest wall.  no crepitus or pain to lateral ribs.  ). She exhibits no mass, no crepitus and no swelling. Right breast exhibits no inverted nipple, no mass, no skin change and no tenderness.  Abdominal: Soft. She exhibits no distension. There is no tenderness. There is no guarding.  Musculoskeletal: Normal range  of motion.  Neurological: She is alert and oriented to person, place, and time. No sensory deficit.  Skin: Skin is warm. Capillary refill takes less than 2 seconds. No rash noted.  Psychiatric: She has a normal mood and affect.  Nursing note and vitals reviewed.    ED Treatments / Results  Labs (all labs ordered are listed, but only abnormal results are displayed) Labs Reviewed  BASIC METABOLIC PANEL - Abnormal; Notable for the following components:      Result Value   Potassium 3.4 (*)    CO2 20 (*)    Calcium 8.5 (*)  All other components within normal limits  CBC WITH DIFFERENTIAL/PLATELET  D-DIMER, QUANTITATIVE (NOT AT [facility])  TROPONIN I    EKG EKG Interpretation  Date/Time:  Saturday April 04 2018 23:49:34 EDT Ventricular Rate:  75 PR Interval:    QRS Duration: 69 QT Interval:  387 QTC Calculation: 433 R Axis:   59 Text Interpretation:  Sinus or ectopic atrial rhythm Baseline wander in lead(s) V4 Otherwise within normal limits When compared with ECG of 07/17/2017, No significant change was found Confirmed by Delora Fuel ([id number]) on 04/05/2018 12:03:00 AM   Radiology Dg Chest 2 View  Result Date: 04/04/2018 CLINICAL DATA:  Intermittent chest pain for 2 days. EXAM: CHEST - 2 VIEW COMPARISON:  07/17/2017 FINDINGS: The heart size and mediastinal contours are within normal limits. Both lungs are clear. The visualized skeletal structures are unremarkable. IMPRESSION: No active cardiopulmonary disease. Electronically Signed   By: Lucienne Capers M.D.   On: 04/04/2018 21:45    Procedures Procedures (including critical care time)  Medications Ordered in ED Medications  acetaminophen (TYLENOL) tablet 650 mg (650 mg Oral Given 04/04/18 2345)     Initial Impression / Assessment and Plan / ED Course  I have reviewed the triage vital signs and the nursing notes.  Pertinent labs & imaging results that were available during my care of the patient were reviewed by me and  considered in my medical decision making (see chart for details).     Pt well appearing.  Pain to right chest reproducable with palpation.  Labs reassuring.  Pain improved after taking Tylenol.  No tachycardia, tachypnea, or hypoxia.  Doubt PE or ACS.  Feel the pain is likely related to inflammatory process.  Patient is currently breast-feeding, so I have advised her to continue Tylenol for her symptoms.  Referral information provided for local clinic to establish primary care.  Return precautions discussed.  Patient agrees to treatment plan and verbalized understanding.  Final Clinical Impressions(s) / ED Diagnoses   Final diagnoses:  Precordial pain    ED Discharge Orders    None       Kem Parkinson, PA-C [id number]    Delora Fuel, MD 30/16/01 [phone]

## 2018-04-04 NOTE — ED Triage Notes (Addendum)
Pt reports intermittent chest pain x 2 days. Pt reports it is painful to sleep on her stomach and right breast and if she coughs it hurts worse though pt states she doesn't cough very often.

## 2018-04-05 LAB — CBC WITH DIFFERENTIAL/PLATELET
BASOS ABS: 0.1 10*3/uL (ref 0.0–0.1)
Basophils Relative: 1 %
EOS ABS: 0.7 10*3/uL (ref 0.0–0.7)
EOS PCT: 9 %
HCT: 37.8 % (ref 36.0–46.0)
Hemoglobin: 12.2 g/dL (ref 12.0–15.0)
LYMPHS ABS: 3.1 10*3/uL (ref 0.7–4.0)
LYMPHS PCT: 40 %
MCH: 28.3 pg (ref 26.0–34.0)
MCHC: 32.3 g/dL (ref 30.0–36.0)
MCV: 87.7 fL (ref 78.0–100.0)
Monocytes Absolute: 0.7 10*3/uL (ref 0.1–1.0)
Monocytes Relative: 9 %
Neutro Abs: 3.3 10*3/uL (ref 1.7–7.7)
Neutrophils Relative %: 41 %
PLATELETS: 317 10*3/uL (ref 150–400)
RBC: 4.31 MIL/uL (ref 3.87–5.11)
RDW: 14 % (ref 11.5–15.5)
WBC: 7.8 10*3/uL (ref 4.0–10.5)

## 2018-04-05 LAB — BASIC METABOLIC PANEL
ANION GAP: 10 (ref 5–15)
BUN: 13 mg/dL (ref 6–20)
CO2: 20 mmol/L — ABNORMAL LOW (ref 22–32)
Calcium: 8.5 mg/dL — ABNORMAL LOW (ref 8.9–10.3)
Chloride: 107 mmol/L (ref 101–111)
Creatinine, Ser: 0.61 mg/dL (ref 0.44–1.00)
GFR calc Af Amer: >60 mL/min (ref >60)
Glucose, Bld: 86 mg/dL (ref 65–99)
POTASSIUM: 3.4 mmol/L — AB (ref 3.5–5.1)
SODIUM: 137 mmol/L (ref 135–145)

## 2018-04-05 LAB — TROPONIN I

## 2018-04-05 LAB — D-DIMER, QUANTITATIVE (NOT AT ARMC): D DIMER QUANT: 0.38 ug{FEU}/mL (ref 0.00–0.50)

## 2018-04-05 NOTE — Discharge Instructions (Addendum)
Apply ice packs on/off to your chest.  You can contact the clinic listed to establish primary care.  Continue taking tylenol every 4 hrs as needed for pain

## 2018-08-17 ENCOUNTER — Encounter: Payer: Self-pay | Admitting: Advanced Practice Midwife

## 2018-09-04 ENCOUNTER — Other Ambulatory Visit: Payer: Self-pay | Admitting: *Deleted

## 2018-09-04 DIAGNOSIS — O3680X0 Pregnancy with inconclusive fetal viability, not applicable or unspecified: Secondary | ICD-10-CM

## 2018-09-08 ENCOUNTER — Ambulatory Visit (HOSPITAL_COMMUNITY): Payer: Medicaid Other

## 2018-09-09 ENCOUNTER — Ambulatory Visit (HOSPITAL_COMMUNITY)
Admission: RE | Admit: 2018-09-09 | Discharge: 2018-09-09 | Disposition: A | Discharge status: Home / Self Care | Payer: Medicaid Other | Source: Ambulatory Visit | Attending: Adult Health | Admitting: Adult Health

## 2018-09-09 DIAGNOSIS — O3680X0 Pregnancy with inconclusive fetal viability, not applicable or unspecified: Secondary | ICD-10-CM

## 2018-09-09 DIAGNOSIS — Z3A01 Less than 8 weeks gestation of pregnancy: Secondary | ICD-10-CM | POA: Insufficient documentation

## 2018-09-10 ENCOUNTER — Telehealth: Payer: Self-pay | Admitting: Adult Health

## 2018-09-10 ENCOUNTER — Telehealth: Payer: Self-pay | Admitting: Women's Health

## 2018-09-10 NOTE — Telephone Encounter (Signed)
Called patient back. She reports that she is having a lot of nausea and is unable to keep anything down. She is getting sick after eating. It doesn't matter what she eats. She is urinating normally.Recommended small meals, small sips of water at a time. She doesn't feel that she is dehydrated.  She is requesting a prescription for nausea. Advised that I would send message to a provider and we would let her know what they say. [facility] is her preferred pharmacy.

## 2018-09-10 NOTE — Telephone Encounter (Signed)
PLeae call pt she had a new ob appt for 9-25, she cant keep anything down at all and is very concerned. Wants to talk to a nurse said the [name] lady at hospital said she may have twins and needed to have f/u US in 2 weeks, ???  Please call pt and advise!!

## 2018-09-10 NOTE — Telephone Encounter (Signed)
Pt had her Dating [name] at aph yesterday and called wanting to know when she needed to schedule her next appt?  Advised her that Tish and Danise Mina were out of office today and we would call her back tomorrow with an answer, Maudie Mercury if you can let me know today I will give her a call back or you can forward to [place]!

## 2018-09-23 ENCOUNTER — Ambulatory Visit (INDEPENDENT_AMBULATORY_CARE_PROVIDER_SITE_OTHER): Payer: Medicaid Other | Admitting: Advanced Practice Midwife

## 2018-09-23 ENCOUNTER — Encounter: Payer: Self-pay | Admitting: Advanced Practice Midwife

## 2018-09-23 ENCOUNTER — Ambulatory Visit: Payer: Self-pay | Admitting: *Deleted

## 2018-09-23 VITALS — BP 110/73 | HR 79 | Wt 128.0 lb

## 2018-09-23 DIAGNOSIS — Z3481 Encounter for supervision of other normal pregnancy, first trimester: Secondary | ICD-10-CM | POA: Diagnosis not present

## 2018-09-23 DIAGNOSIS — Z1389 Encounter for screening for other disorder: Secondary | ICD-10-CM

## 2018-09-23 DIAGNOSIS — Z23 Encounter for immunization: Secondary | ICD-10-CM | POA: Diagnosis not present

## 2018-09-23 DIAGNOSIS — O09299 Supervision of pregnancy with other poor reproductive or obstetric history, unspecified trimester: Secondary | ICD-10-CM | POA: Diagnosis not present

## 2018-09-23 DIAGNOSIS — Z3A08 8 weeks gestation of pregnancy: Secondary | ICD-10-CM | POA: Diagnosis not present

## 2018-09-23 DIAGNOSIS — Z331 Pregnant state, incidental: Secondary | ICD-10-CM

## 2018-09-23 DIAGNOSIS — Z8632 Personal history of gestational diabetes: Secondary | ICD-10-CM

## 2018-09-23 DIAGNOSIS — Z3490 Encounter for supervision of normal pregnancy, unspecified, unspecified trimester: Secondary | ICD-10-CM | POA: Insufficient documentation

## 2018-09-23 DIAGNOSIS — Z3682 Encounter for antenatal screening for nuchal translucency: Secondary | ICD-10-CM

## 2018-09-23 LAB — POCT URINALYSIS DIPSTICK OB
GLUCOSE, UA: NEGATIVE
Ketones, UA: NEGATIVE
LEUKOCYTES UA: NEGATIVE
NITRITE UA: NEGATIVE
POC,PROTEIN,UA: NEGATIVE
RBC UA: NEGATIVE

## 2018-09-23 MED ORDER — PROMETHAZINE HCL 12.5 MG PO TABS: 12.5000 mg | ORAL_TABLET | Freq: Four times a day (QID) | ORAL | 0 refills | Status: DC | PRN

## 2018-09-23 MED ORDER — DOXYLAMINE-PYRIDOXINE 10-10 MG PO TBEC: DELAYED_RELEASE_TABLET | ORAL | 6 refills | Status: DC

## 2018-09-23 NOTE — Progress Notes (Signed)
INITIAL OBSTETRICAL VISIT Patient name: Peggy [name] MRN [id number]  Date of birth: 05-02-1991 Chief Complaint:   Initial Prenatal Visit (nausea)  History of Present Illness:   Peggy [name] is a 27 y.o. [id number] Caucasian female at [redacted]w[redacted]d by 6 week [name] with an Estimated Date of Delivery: 05/04/19 being seen today for her initial obstetrical visit.   Her obstetrical history is significant for Gestational diabetes.  She states that she was using Nuva Ring when she conceived.  States that she also got pregnant on Nexplanon (however, record reveals that she had Nexplanon removed 02/10/14 and got pregnant in March 2015) Today she reports nausea.  Patient's last menstrual period was 03/04/2018. Last pap 2019. Results were: normal Review of Systems:   Pertinent items are noted in HPI Denies cramping/contractions, leakage of fluid, vaginal bleeding, abnormal vaginal discharge w/ itching/odor/irritation, headaches, visual changes, shortness of breath, chest pain, abdominal pain, severe nausea/vomiting, or problems with urination or bowel movements unless otherwise stated above.  Pertinent History Reviewed:  Reviewed past medical,surgical, social, obstetrical and family history.  Reviewed problem list, medications and allergies. OB History  Gravida Para Term Preterm AB Living  4 3 3  0 0 3  SAB TAB Ectopic Multiple Live Births  0 0 0 0 3    # Outcome Date GA Lbr Len/2nd Weight Sex Delivery Anes PTL Lv  4 Current           3 Term 08/15/17 [redacted]w[redacted]d 06:39 / 00:07 6 lb 4.5 oz (2.85 kg) F Vag-Spont EPI N LIV     Complications: Gestational diabetes  2 Term 12/10/14 [redacted]w[redacted]d 17:53 / 00:13 6 lb 2.6 oz (2.795 kg) F Vag-Spont EPI N LIV  1 Term 07/10/12 [redacted]w[redacted]d 26:17 6 lb 2.4 oz (2.79 kg) F Vag-Spont EPI N LIV   Physical Assessment:   Vitals:   09/23/18 0925  BP: 110/73  Pulse: 79  Weight: 128 lb (58.1 kg)  Body mass index is 25 kg/m.       Physical Examination:  General appearance - well appearing,  and in no distress  Mental status - alert, oriented to person, place, and time  Psych:  She has a normal mood and affect  Skin - warm and dry, normal color, no suspicious lesions noted  Chest - effort normal, all lung fields clear to auscultation bilaterally  Heart - normal rate and regular rhythm  Abdomen - soft, nontender  Extremities:  No swelling or varicosities noted    Results for orders placed or performed in visit on 09/23/18 (from the past 24 hour(s))  POC Urinalysis Dipstick OB   Collection Time: 09/23/18  9:36 AM  Result Value Ref Range   Color, UA     Clarity, UA     Glucose, UA Negative Negative   Bilirubin, UA     Ketones, UA neg    Spec Grav, UA     Blood, UA neg    pH, UA     POC Protein UA Negative Negative, Trace   Urobilinogen, UA     Nitrite, UA neg    Leukocytes, UA Negative Negative   Appearance     Odor      Assessment & Plan:  1) Low-Risk Pregnancy G4P3003 at [redacted]w[redacted]d with an Estimated Date of Delivery: 05/04/19   2) Initial OB visit  3)   Meds:  Meds ordered this encounter  Medications  . Doxylamine-Pyridoxine (DICLEGIS) 10-10 MG TBEC    Sig: Take 2 qhs; may also take one  in am and one in afternoon prn nausea    Dispense:  120 tablet    Refill:  6    Order Specific Question:   Supervising Provider    Answer:   Elonda Husky, LUTHER H [[id number]]  . promethazine (PHENERGAN) 12.5 MG tablet    Sig: Take 1 tablet (12.5 mg total) by mouth every 6 (six) hours as needed for nausea or vomiting.    Dispense:  30 tablet    Refill:  0    Order Specific Question:   Supervising Provider    Answer:   Florian Buff [[id number]]    Initial labs obtained Continue prenatal vitamins Reviewed n/v relief measures and warning s/s to report Reviewed recommended weight gain based on pre-gravid BMI Encouraged well-balanced diet Genetic Screening discussed Integrated Screen: requested Cystic fibrosis screening discussed declined Ultrasound discussed; fetal survey: requested CCNC  completed  Follow-up: Return in about 5 weeks (around 10/28/2018) for LROB, US:NT+1st IT.   Orders Placed This Encounter  Procedures  . GC/Chlamydia Probe Amp  . Urine Culture  . Flu Vaccine QUAD 36+ mos IM  . Urinalysis, Routine w reflex microscopic  . Obstetric Panel, Including HIV  . Pain Management Screening Profile (10S)  . HgB A1c  . POC Urinalysis Dipstick OB    Christin Fudge DNP, CNM 09/23/2018 2:36 PM

## 2018-09-24 LAB — PMP SCREEN PROFILE (10S), URINE
Amphetamine Scrn, Ur: NEGATIVE ng/mL
BARBITURATE SCREEN URINE: NEGATIVE ng/mL
BENZODIAZEPINE SCREEN, URINE: NEGATIVE ng/mL
CANNABINOIDS UR QL SCN: POSITIVE ng/mL — AB
Cocaine (Metab) Scrn, Ur: NEGATIVE ng/mL
Creatinine(Crt), U: 122.7 mg/dL (ref 20.0–300.0)
Methadone Screen, Urine: NEGATIVE ng/mL
OXYCODONE+OXYMORPHONE UR QL SCN: NEGATIVE ng/mL
Opiate Scrn, Ur: NEGATIVE ng/mL
Ph of Urine: 7.3 (ref 4.5–8.9)
Phencyclidine Qn, Ur: NEGATIVE ng/mL
Propoxyphene Scrn, Ur: NEGATIVE ng/mL

## 2018-09-25 DIAGNOSIS — Z8632 Personal history of gestational diabetes: Secondary | ICD-10-CM | POA: Diagnosis not present

## 2018-09-25 DIAGNOSIS — O09299 Supervision of pregnancy with other poor reproductive or obstetric history, unspecified trimester: Secondary | ICD-10-CM | POA: Diagnosis not present

## 2018-09-25 DIAGNOSIS — Z3A08 8 weeks gestation of pregnancy: Secondary | ICD-10-CM | POA: Diagnosis not present

## 2018-09-25 DIAGNOSIS — Z3481 Encounter for supervision of other normal pregnancy, first trimester: Secondary | ICD-10-CM | POA: Diagnosis not present

## 2018-09-25 LAB — URINE CULTURE

## 2018-09-26 LAB — URINALYSIS, ROUTINE W REFLEX MICROSCOPIC
Bilirubin, UA: NEGATIVE
GLUCOSE, UA: NEGATIVE
Ketones, UA: NEGATIVE
Nitrite, UA: NEGATIVE
PH UA: 6.5 (ref 5.0–7.5)
RBC, UA: NEGATIVE
Specific Gravity, UA: 1.026 (ref 1.005–1.030)
Urobilinogen, Ur: 0.2 mg/dL (ref 0.2–1.0)

## 2018-09-26 LAB — OBSTETRIC PANEL, INCLUDING HIV
Antibody Screen: NEGATIVE
BASOS ABS: 0 10*3/uL (ref 0.0–0.2)
Basos: 0 %
EOS (ABSOLUTE): 0.3 10*3/uL (ref 0.0–0.4)
Eos: 3 %
HIV Screen 4th Generation wRfx: NONREACTIVE
Hematocrit: 38.7 % (ref 34.0–46.6)
Hemoglobin: 12.9 g/dL (ref 11.1–15.9)
Hepatitis B Surface Ag: NEGATIVE
IMMATURE GRANULOCYTES: 0 %
Immature Grans (Abs): 0 10*3/uL (ref 0.0–0.1)
Lymphocytes Absolute: 2.7 10*3/uL (ref 0.7–3.1)
Lymphs: 32 %
MCH: 29.3 pg (ref 26.6–33.0)
MCHC: 33.3 g/dL (ref 31.5–35.7)
MCV: 88 fL (ref 79–97)
Monocytes Absolute: 0.4 10*3/uL (ref 0.1–0.9)
Monocytes: 5 %
NEUTROS ABS: 5.2 10*3/uL (ref 1.4–7.0)
NEUTROS PCT: 60 %
PLATELETS: 367 10*3/uL (ref 150–450)
RBC: 4.4 x10E6/uL (ref 3.77–5.28)
RDW: 13.7 % (ref 12.3–15.4)
RH TYPE: POSITIVE
RPR Ser Ql: NONREACTIVE
RUBELLA: 2.36 {index} (ref >0.99)
WBC: 8.7 10*3/uL (ref 3.4–10.8)

## 2018-09-26 LAB — MICROSCOPIC EXAMINATION

## 2018-09-26 LAB — GC/CHLAMYDIA PROBE AMP
CHLAMYDIA, DNA PROBE: NEGATIVE
NEISSERIA GONORRHOEAE BY PCR: NEGATIVE

## 2018-09-26 LAB — HEMOGLOBIN A1C
ESTIMATED AVERAGE GLUCOSE: 97 mg/dL
Hgb A1c MFr Bld: 5 % (ref 4.8–5.6)

## 2018-09-28 ENCOUNTER — Inpatient Hospital Stay (HOSPITAL_COMMUNITY)
Admission: AD | Admit: 2018-09-28 | Discharge: 2018-09-28 | Disposition: A | Discharge status: Home / Self Care | Payer: Medicaid Other | Source: Ambulatory Visit | Attending: Family Medicine | Admitting: Family Medicine

## 2018-09-28 ENCOUNTER — Encounter (HOSPITAL_COMMUNITY): Payer: Self-pay | Admitting: *Deleted

## 2018-09-28 DIAGNOSIS — Z3A08 8 weeks gestation of pregnancy: Secondary | ICD-10-CM | POA: Diagnosis not present

## 2018-09-28 DIAGNOSIS — R102 Pelvic and perineal pain: Secondary | ICD-10-CM | POA: Diagnosis present

## 2018-09-28 DIAGNOSIS — F17210 Nicotine dependence, cigarettes, uncomplicated: Secondary | ICD-10-CM | POA: Insufficient documentation

## 2018-09-28 DIAGNOSIS — O074 Failed attempted termination of pregnancy without complication: Secondary | ICD-10-CM | POA: Insufficient documentation

## 2018-09-28 DIAGNOSIS — O99331 Smoking (tobacco) complicating pregnancy, first trimester: Secondary | ICD-10-CM | POA: Insufficient documentation

## 2018-09-28 LAB — URINALYSIS, ROUTINE W REFLEX MICROSCOPIC
Bilirubin Urine: NEGATIVE
Glucose, UA: NEGATIVE mg/dL
Hgb urine dipstick: NEGATIVE
Ketones, ur: NEGATIVE mg/dL
Leukocytes, UA: NEGATIVE
Nitrite: NEGATIVE
Protein, ur: NEGATIVE mg/dL
Specific Gravity, Urine: 1.009 (ref 1.005–1.030)
pH: 6 (ref 5.0–8.0)

## 2018-09-28 MED ORDER — LACTATED RINGERS IV BOLUS
1000.0000 mL | Freq: Once | INTRAVENOUS | Status: AC
  Administered 2018-09-28: 1000 mL via INTRAVENOUS

## 2018-09-28 NOTE — MAU Provider Note (Signed)
History     CSN: [id number]  Arrival date and time: 09/28/18 6195   First Provider Initiated Contact with Patient 09/28/18 1050      Chief Complaint  Patient presents with  . Pelvic Pain  . Dizziness   HPI  [name] is a 27 y.o. [phone] at [redacted]w[redacted]d who presents to MAU today with multiple complaints. The patient states that she had started care at [facility] ([place] but then went to [facility] this weekend and took the abortion pills. She also took Naproxen for cramping. She states cramping is mild to moderate. She denies vaginal bleeding, but endorses increased pressure. She is more concerned about chest pain, lightheadedness, dizziness and headache since taking that medication. She states that she feels "out of it".   OB History    Gravida  4   Para  3   Term  3   Preterm  0   AB  0   Living  3     SAB  0   TAB  0   Ectopic  0   Multiple  0   Live Births  3           Past Medical History:  Diagnosis Date  . Asthma   . Gestational diabetes   . Pyloric stenosis     Past Surgical History:  Procedure Laterality Date  . PYLOROMYOTOMY      Family History  Problem Relation Age of Onset  . Arthritis Mother   . Cancer Mother        thyroid  . Depression Mother   . Hyperlipidemia Father   . Asthma Father   . Hypertension Father   . Heart disease Father   . COPD Maternal Grandfather   . Diabetes Paternal Grandmother   . Arthritis Paternal Grandmother   . Asthma Paternal Grandmother   . Heart disease Paternal Grandmother   . Depression Paternal Grandmother   . Hypertension Paternal Grandmother   . Hyperlipidemia Paternal Grandmother   . Other Neg Hx     Social History   Tobacco Use  . Smoking status: Current Every Day Smoker    Packs/day: 0.25    Years: 5.00    Pack years: 1.25    Types: Cigarettes  . Smokeless tobacco: Never Used  . Tobacco comment: 2 cig/day  Substance Use Topics  . Alcohol use: No  . Drug use: No     Allergies: No Known Allergies  Medications Prior to Admission  Medication Sig Dispense Refill Last Dose  . acetaminophen (TYLENOL) 325 MG tablet Take 650 mg by mouth every 6 (six) hours as needed for mild pain or headache.   09/28/2018 at Unknown time  . Doxylamine-Pyridoxine (DICLEGIS) 10-10 MG TBEC Take 2 qhs; may also take one in am and one in afternoon prn nausea 120 tablet 6 09/27/2018 at Unknown time  . promethazine (PHENERGAN) 12.5 MG tablet Take 1 tablet (12.5 mg total) by mouth every 6 (six) hours as needed for nausea or vomiting. 30 tablet 0 09/27/2018 at Unknown time  . Prenatal Vit-Fe Phos-FA-Omega (VITAFOL GUMMIES) 3.33-0.333-34.8 MG CHEW Chew 3 tablets by mouth at bedtime. (Patient not taking: Reported on 09/23/2018) 90 tablet 12 Not Taking  . PROAIR HFA 108 (90 Base) MCG/ACT inhaler INHALE 1-2 PUFFS INTO THE LUNGS EVERY 4 (FOUR) HOURS AS NEEDED FOR WHEEZING OR SHORTNESS OF BREATH. 8.5 Inhaler 1 emergency    Review of Systems  Constitutional: Negative for fever.  Gastrointestinal: Positive for abdominal pain.  Negative for constipation, diarrhea, nausea and vomiting.  Genitourinary: Negative for dysuria, frequency, urgency, vaginal bleeding and vaginal discharge.  Neurological: Positive for dizziness and light-headedness.   Physical Exam   Blood pressure 127/67, pulse 92, temperature 99.2 F (37.3 C), temperature source Oral, resp. rate 18, height 5' (1.524 m), weight 58.1 kg, SpO2 100 %, currently breastfeeding.  Physical Exam  Nursing note and vitals reviewed. Constitutional: She is oriented to person, place, and time. She appears well-developed and well-nourished. No distress.  HENT:  Head: Normocephalic and atraumatic.  Cardiovascular: Normal rate, regular rhythm and normal heart sounds.  Respiratory: Effort normal and breath sounds normal. No respiratory distress. She has no wheezes.  GI: Soft. She exhibits no distension and no mass. There is no tenderness. There is  no rebound and no guarding.  Neurological: She is alert and oriented to person, place, and time.  Skin: Skin is warm and dry. No erythema.  Psychiatric: She has a normal mood and affect.    Results for orders placed or performed during the hospital encounter of 09/28/18 (from the past 24 hour(s))  Urinalysis, Routine w reflex microscopic     Status: Abnormal   Collection Time: 09/28/18 10:53 AM  Result Value Ref Range   Color, Urine STRAW (A) YELLOW   APPearance CLEAR CLEAR   Specific Gravity, Urine 1.009 1.005 - 1.030   pH 6.0 5.0 - 8.0   Glucose, UA NEGATIVE NEGATIVE mg/dL   Hgb urine dipstick NEGATIVE NEGATIVE   Bilirubin Urine NEGATIVE NEGATIVE   Ketones, ur NEGATIVE NEGATIVE mg/dL   Protein, ur NEGATIVE NEGATIVE mg/dL   Nitrite NEGATIVE NEGATIVE   Leukocytes, UA NEGATIVE NEGATIVE    MAU Course  Procedures  Pt informed that the ultrasound is considered a limited OB ultrasound and is not intended to be a complete ultrasound exam.  Patient also informed that the ultrasound is not being completed with the intent of assessing for fetal or placental anomalies or any pelvic abnormalities.  Explained that the purpose of today's ultrasound is to assess for  viability.  Patient acknowledges the purpose of the exam and the limitations of the study. SIUP noted with +FHR and +FM.   MDM EKG - NSR Bedside US shows viable SIUP IV LR bolus in MAU today  VSS. Patient well-appearing after IV fluids.  Discussed with Dr. Kennon Rounds. Patient to follow-up with [facility].   Assessment and Plan  A: SIUP at [redacted]w[redacted]d Failed abortion   P: Discharge home Tylenol PRN for pain advised  Bleeding precautions/ warning signs for worsening conditions discussed Patient advised to follow-up with [facility] as soon as possible  Patient may return to MAU as needed or if her condition were to change or worsen  Kerry Hough, PA-C 09/28/2018, 12:19 PM

## 2018-09-28 NOTE — Discharge Instructions (Signed)

## 2018-09-28 NOTE — MAU Note (Signed)
Pt states she took abortion pill Saturday morning, took the rest Sunday morning.  Hasn't had any bleeding, has pelvic pressure & fullness.  Also C/O dizziness, seeing spots, shaky,  started having chest pain yesterday - became severe @ 0100.  Taking pain med but not helping.

## 2018-09-30 ENCOUNTER — Other Ambulatory Visit: Payer: Self-pay

## 2018-10-19 ENCOUNTER — Other Ambulatory Visit: Payer: Self-pay | Admitting: Women's Health

## 2018-10-19 MED ORDER — PROMETHAZINE HCL 12.5 MG PO TABS: 12.5000 mg | ORAL_TABLET | Freq: Four times a day (QID) | ORAL | 0 refills | Status: DC | PRN

## 2018-10-19 MED ORDER — ALBUTEROL SULFATE HFA 108 (90 BASE) MCG/ACT IN AERS: 1.0000 | INHALATION_SPRAY | RESPIRATORY_TRACT | 1 refills | Status: DC | PRN

## 2018-10-22 ENCOUNTER — Other Ambulatory Visit: Payer: Self-pay

## 2018-10-22 ENCOUNTER — Emergency Department (HOSPITAL_COMMUNITY): Payer: Medicaid Other

## 2018-10-22 ENCOUNTER — Encounter (HOSPITAL_COMMUNITY): Payer: Self-pay | Admitting: *Deleted

## 2018-10-22 ENCOUNTER — Emergency Department (HOSPITAL_COMMUNITY)
Admission: EM | Admit: 2018-10-22 | Discharge: 2018-10-22 | Disposition: A | Discharge status: Home / Self Care | Payer: Medicaid Other | Attending: Emergency Medicine | Admitting: Emergency Medicine

## 2018-10-22 ENCOUNTER — Telehealth: Payer: Self-pay | Admitting: *Deleted

## 2018-10-22 DIAGNOSIS — J069 Acute upper respiratory infection, unspecified: Secondary | ICD-10-CM | POA: Insufficient documentation

## 2018-10-22 DIAGNOSIS — J4521 Mild intermittent asthma with (acute) exacerbation: Secondary | ICD-10-CM | POA: Insufficient documentation

## 2018-10-22 DIAGNOSIS — R058 Other specified cough: Secondary | ICD-10-CM

## 2018-10-22 DIAGNOSIS — R0602 Shortness of breath: Secondary | ICD-10-CM | POA: Diagnosis not present

## 2018-10-22 DIAGNOSIS — F17210 Nicotine dependence, cigarettes, uncomplicated: Secondary | ICD-10-CM | POA: Insufficient documentation

## 2018-10-22 DIAGNOSIS — R05 Cough: Secondary | ICD-10-CM | POA: Diagnosis not present

## 2018-10-22 DIAGNOSIS — Z79899 Other long term (current) drug therapy: Secondary | ICD-10-CM | POA: Diagnosis not present

## 2018-10-22 DIAGNOSIS — R062 Wheezing: Secondary | ICD-10-CM

## 2018-10-22 MED ORDER — ALBUTEROL SULFATE (2.5 MG/3ML) 0.083% IN NEBU: 2.5000 mg | INHALATION_SOLUTION | RESPIRATORY_TRACT | 12 refills | Status: DC | PRN

## 2018-10-22 MED ORDER — ALBUTEROL SULFATE (2.5 MG/3ML) 0.083% IN NEBU
5.0000 mg | INHALATION_SOLUTION | Freq: Once | RESPIRATORY_TRACT | Status: AC
  Administered 2018-10-22: 5 mg via RESPIRATORY_TRACT
  Filled 2018-10-22: qty 6

## 2018-10-22 NOTE — Discharge Instructions (Signed)
Use your albuterol inhaler 2 puffs every 4 hours as needed for shortness of breath or your albuterol nebulizer every 4 hours as needed for shortness of breath.  Return if needed more than every 4 hours or [facility].  chest X-ray shows no evidence of pneumonia

## 2018-10-22 NOTE — ED Provider Notes (Addendum)
[facility] EMERGENCY DEPARTMENT Provider Note   CSN: [id number] Arrival date & time: 10/22/18  1738     History   Chief Complaint Chief Complaint  Patient presents with  . Wheezing    HPI Peggy [name] is a 27 y.o. female.  Complains of wheezing, sneeze, cough, shortness of breath onset 2 days ago.  No known fever.  Other symptoms include nasal congestion.  She is been treating herself with her albuterol inhaler and nebulizer, without relief.  No other associated symptoms nothing makes symptoms better or worse no other associated symptoms  HPI  Past Medical History:  Diagnosis Date  . Asthma   . Gestational diabetes   . Pyloric stenosis     Patient Active Problem List   Diagnosis Date Noted  . Supervision of normal pregnancy 09/23/2018  . Low vitamin D level 02/12/2017  . Smoker 07/25/2014    Past Surgical History:  Procedure Laterality Date  . PYLOROMYOTOMY       OB History    Gravida  4   Para  3   Term  3   Preterm  0   AB  0   Living  3     SAB  0   TAB  0   Ectopic  0   Multiple  0   Live Births  3            Home Medications    Prior to Admission medications   Medication Sig Start Date End Date Taking? Authorizing Provider  acetaminophen (TYLENOL) 325 MG tablet Take 650 mg by mouth every 6 (six) hours as needed for mild pain or headache.    [provider]  albuterol (PROAIR HFA) 108 (90 Base) MCG/ACT inhaler Inhale 1-2 puffs into the lungs every 4 (four) hours as needed for wheezing or shortness of breath. 10/19/18   Roma Schanz, CNM  Doxylamine-Pyridoxine (DICLEGIS) 10-10 MG TBEC Take 2 qhs; may also take one in am and one in afternoon prn nausea 09/23/18   Cresenzo-Dishmon, Joaquim Lai, CNM  Prenatal Vit-Fe Phos-FA-Omega (VITAFOL GUMMIES) 3.33-0.333-34.8 MG CHEW Chew 3 tablets by mouth at bedtime. Patient not taking: Reported on 09/23/2018 02/06/17   Morene Crocker, CNM  promethazine (PHENERGAN) 12.5 MG tablet  Take 1 tablet (12.5 mg total) by mouth every 6 (six) hours as needed for nausea or vomiting. 10/19/18   Roma Schanz, CNM    Family History Family History  Problem Relation Age of Onset  . Arthritis Mother   . Cancer Mother        thyroid  . Depression Mother   . Hyperlipidemia Father   . Asthma Father   . Hypertension Father   . Heart disease Father   . COPD Maternal Grandfather   . Diabetes Paternal Grandmother   . Arthritis Paternal Grandmother   . Asthma Paternal Grandmother   . Heart disease Paternal Grandmother   . Depression Paternal Grandmother   . Hypertension Paternal Grandmother   . Hyperlipidemia Paternal Grandmother   . Other Neg Hx     Social History Social History   Tobacco Use  . Smoking status: Current Every Day Smoker    Packs/day: 0.25    Years: 5.00    Pack years: 1.25    Types: Cigarettes  . Smokeless tobacco: Never Used  . Tobacco comment: 2 cig/day  Substance Use Topics  . Alcohol use: No  . Drug use: No  Smoker quit when she learned she was pregnant  Allergies   Patient has no known allergies.   Review of Systems Review of Systems  Constitutional: Negative.   HENT: Positive for rhinorrhea and sneezing.   Respiratory: Positive for cough, shortness of breath and wheezing.   Cardiovascular: Negative.   Gastrointestinal: Negative.   Genitourinary:       Pregnant  Musculoskeletal: Negative.   Skin: Negative.   Neurological: Negative.   Psychiatric/Behavioral: Negative.   All other systems reviewed and are negative.    Physical Exam Updated Vital Signs BP (!) 151/91   Pulse (!) 110   Temp 99.6 F (37.6 C) (Oral)   Resp 20   Ht 5' (1.524 m)   SpO2 98%   BMI 25.00 kg/m   Physical Exam  Constitutional: She is oriented to person, place, and time. She appears well-developed and well-nourished. No distress.  HENT:  Head: Normocephalic and atraumatic.  Right Ear: External ear normal.  Left Ear: External ear normal.    Mouth/Throat: Oropharynx is clear and moist.  Nasal congestion present  Eyes: Pupils are equal, round, and reactive to light. Conjunctivae are normal.  Neck: Neck supple. No tracheal deviation present. No thyromegaly present.  Cardiovascular: Normal rate, regular rhythm and normal heart sounds.  No murmur heard. Pulmonary/Chest: Effort normal. No respiratory distress. She has wheezes.  Tory wheezes speaks in paragraphs no respiratory distress  Abdominal: Soft. Bowel sounds are normal. She exhibits no distension. There is no tenderness.  Gravid fetal heart tones 156 bpm  Musculoskeletal: Normal range of motion. She exhibits no edema or tenderness.  Neurological: She is alert and oriented to person, place, and time. Coordination normal.  Skin: Skin is warm and dry. No rash noted.  Psychiatric: She has a normal mood and affect.  Nursing note and vitals reviewed.    ED Treatments / Results  Labs (all labs ordered are listed, but only abnormal results are displayed) Labs Reviewed - No data to display  EKG None Chest x-ray viewed by me Results for orders placed or performed during the hospital encounter of 09/28/18  Urinalysis, Routine w reflex microscopic  Result Value Ref Range   Color, Urine STRAW (A) YELLOW   APPearance CLEAR CLEAR   Specific Gravity, Urine 1.009 1.005 - 1.030   pH 6.0 5.0 - 8.0   Glucose, UA NEGATIVE NEGATIVE mg/dL   Hgb urine dipstick NEGATIVE NEGATIVE   Bilirubin Urine NEGATIVE NEGATIVE   Ketones, ur NEGATIVE NEGATIVE mg/dL   Protein, ur NEGATIVE NEGATIVE mg/dL   Nitrite NEGATIVE NEGATIVE   Leukocytes, UA NEGATIVE NEGATIVE   Dg Chest 1 View  Result Date: 10/22/2018 CLINICAL DATA:  Cough, wheezing EXAM: CHEST  1 VIEW COMPARISON:  04/04/2018 FINDINGS: Heart and mediastinal contours are within normal limits. No focal opacities or effusions. No acute bony abnormality. IMPRESSION: No active disease. Electronically Signed   By: Rolm Baptise M.D.   On:  10/22/2018 19:16   Radiology No results found.  Procedures Procedures (including critical care time)  Medications Ordered in ED Medications  albuterol (PROVENTIL) (2.5 MG/3ML) 0.083% nebulizer solution 5 mg (has no administration in time range)     Initial Impression / Assessment and Plan / ED Course  I have reviewed the triage vital signs and the nursing notes.  Pertinent labs & imaging results that were available during my care of the patient were reviewed by me and considered in my medical decision making (see chart for details).     1:30 PM after 1 albuterol nebulized treatment patient states her  breathing is almost at baseline she feels ready to go home.  Lungs clear to auscultation.  She is able to ambulate in the ED without dyspnea and pulse oximetry remains normal she is requesting prescription for albuterol nebulizer solution, which I will write.  She does have an albuterol inhaler at home. No evidence of pneumonia and albuterol every 4 hours as needed.  Return if needed more than every 4 hours or see PMD Final Clinical Impressions(s) / ED Diagnoses  Diagnoses #1 upper respiratory infection #2 exacerbation of asthma Final diagnoses:  Nocturnal cough with wheeze    ED Discharge Orders    None       Orlie Dakin, MD 10/22/18 1950    Orlie Dakin, MD 10/22/18 [phone]

## 2018-10-22 NOTE — ED Triage Notes (Signed)
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 States she is pregnant

## 2018-10-22 NOTE — ED Notes (Signed)
Ambulated pt per Dr request. HR 103, O2 97% and resp 18

## 2018-10-22 NOTE — Telephone Encounter (Signed)
Called patient back. She says that she has been sick since Sunday. Two of the kids in her class have pneumonia. She has asthma. She reports runny nose, some shortness of breath and tightness in her chest, headache and cough. Her temp this afternoon is 99.9. Advised that she go to urgent care for evaluation or her pcp. Patient agreeable no further questions at this time.

## 2018-10-22 NOTE — ED Notes (Signed)
Patient transported to X-ray 

## 2018-10-28 ENCOUNTER — Ambulatory Visit (INDEPENDENT_AMBULATORY_CARE_PROVIDER_SITE_OTHER): Payer: Medicaid Other | Admitting: Women's Health

## 2018-10-28 ENCOUNTER — Encounter: Payer: Self-pay | Admitting: Women's Health

## 2018-10-28 ENCOUNTER — Ambulatory Visit (INDEPENDENT_AMBULATORY_CARE_PROVIDER_SITE_OTHER): Payer: Medicaid Other

## 2018-10-28 VITALS — BP 124/71 | HR 99 | Wt 128.0 lb

## 2018-10-28 DIAGNOSIS — Z3682 Encounter for antenatal screening for nuchal translucency: Secondary | ICD-10-CM

## 2018-10-28 DIAGNOSIS — Z331 Pregnant state, incidental: Secondary | ICD-10-CM

## 2018-10-28 DIAGNOSIS — Z8632 Personal history of gestational diabetes: Secondary | ICD-10-CM | POA: Insufficient documentation

## 2018-10-28 DIAGNOSIS — Z3A13 13 weeks gestation of pregnancy: Secondary | ICD-10-CM

## 2018-10-28 DIAGNOSIS — Z3481 Encounter for supervision of other normal pregnancy, first trimester: Secondary | ICD-10-CM

## 2018-10-28 DIAGNOSIS — Z1389 Encounter for screening for other disorder: Secondary | ICD-10-CM

## 2018-10-28 DIAGNOSIS — Z3402 Encounter for supervision of normal first pregnancy, second trimester: Secondary | ICD-10-CM

## 2018-10-28 LAB — POCT URINALYSIS DIPSTICK OB
Blood, UA: NEGATIVE
Glucose, UA: NEGATIVE
Ketones, UA: NEGATIVE
NITRITE UA: NEGATIVE
POC,PROTEIN,UA: NEGATIVE

## 2018-10-28 NOTE — Progress Notes (Signed)
   LOW-RISK PREGNANCY VISIT Patient name: Peggy [name] MRN [id number]  Date of birth: [birth year] Chief Complaint:   Routine Prenatal Visit (NT today)  History of Present Illness:   Peggy [name] is a 27 y.o. [phone] female at [redacted]w[redacted]d with an Estimated Date of Delivery: 05/04/19 being seen today for ongoing management of a low-risk pregnancy.  Today she reports no complaints.  . Vag. Bleeding: None.  Movement: Absent. denies leaking of fluid. Review of Systems:   Pertinent items are noted in HPI Denies abnormal vaginal discharge w/ itching/odor/irritation, headaches, visual changes, shortness of breath, chest pain, abdominal pain, severe nausea/vomiting, or problems with urination or bowel movements unless otherwise stated above. Pertinent History Reviewed:  Reviewed past medical,surgical, social, obstetrical and family history.  Reviewed problem list, medications and allergies. Physical Assessment:   Vitals:   10/28/18 0934  BP: 124/71  Pulse: 99  Weight: 128 lb (58.1 kg)  Body mass index is 25 kg/m.        Physical Examination:   General appearance: Well appearing, and in no distress  Mental status: Alert, oriented to person, place, and time  Skin: Warm & dry  Cardiovascular: Normal heart rate noted  Respiratory: Normal respiratory effort, no distress  Abdomen: Soft, gravid, nontender  Pelvic: Cervical exam deferred         Extremities: Edema: None  Fetal Status: Fetal Heart Rate (bpm): 152 u/s   Movement: Absent    [name] 13+1 wks,measurements c/w dates,crl 80.75 mm,NB present ,NT 1.6 mm,fhr 152 bpm,anterior placenta gr 0,normal ovaries bilat  Results for orders placed or performed in visit on 10/28/18 (from the past 24 hour(s))  POC Urinalysis Dipstick OB   Collection Time: 10/28/18  9:34 AM  Result Value Ref Range   Color, UA     Clarity, UA     Glucose, UA Negative Negative   Bilirubin, UA     Ketones, UA neg    Spec Grav, UA     Blood, UA neg    pH, UA     POC Protein UA Negative Negative, Trace   Urobilinogen, UA     Nitrite, UA neg    Leukocytes, UA Moderate (2+) (A) Negative   Appearance     Odor      Assessment & Plan:  1) Low-risk pregnancy [id number] at [redacted]w[redacted]d with an Estimated Date of Delivery: 05/04/19    Meds: No orders of the defined types were placed in this encounter.  Labs/procedures today: 1st IT/NT  Plan:  Continue routine obstetrical care   Reviewed: Preterm labor symptoms and general obstetric precautions including but not limited to vaginal bleeding, contractions, leaking of fluid and fetal movement were reviewed in detail with the patient.  All questions were answered  Follow-up: Return in about 3 weeks (around 11/18/2018) for [place], 2nd IT.  Orders Placed This Encounter  Procedures  . Integrated 1  . POC Urinalysis Dipstick OB   Roma Schanz CNM, [facility] 10/28/2018 10:01 AM

## 2018-10-28 NOTE — Patient Instructions (Signed)
Peggy [name], I greatly value your feedback.  If you receive a survey following your visit with us today, we appreciate you taking the time to fill it out.  Thanks, Kim Ailis Rigaud, CNM, WHNP-BC   Second Trimester of Pregnancy The second trimester is from week 14 through week 27 (months 4 through 6). The second trimester is often a time when you feel your best. Your body has adjusted to being pregnant, and you begin to feel better physically. Usually, morning sickness has lessened or quit completely, you may have more energy, and you may have an increase in appetite. The second trimester is also a time when the fetus is growing rapidly. At the end of the sixth month, the fetus is about 9 inches long and weighs about 1 pounds. You will likely begin to feel the baby move (quickening) between 16 and 20 weeks of pregnancy. Body changes during your second trimester Your body continues to go through many changes during your second trimester. The changes vary from woman to woman.  Your weight will continue to increase. You will notice your lower abdomen bulging out.  You may begin to get stretch marks on your hips, abdomen, and breasts.  You may develop headaches that can be relieved by medicines. The medicines should be approved by your health care provider.  You may urinate more often because the fetus is pressing on your bladder.  You may develop or continue to have heartburn as a result of your pregnancy.  You may develop constipation because certain hormones are causing the muscles that push waste through your intestines to slow down.  You may develop hemorrhoids or swollen, bulging veins (varicose veins).  You may have back pain. This is caused by: ? Weight gain. ? Pregnancy hormones that are relaxing the joints in your pelvis. ? A shift in weight and the muscles that support your balance.  Your breasts will continue to grow and they will continue to become tender.  Your gums may  bleed and may be sensitive to brushing and flossing.  Dark spots or blotches (chloasma, mask of pregnancy) may develop on your face. This will likely fade after the baby is born.  A dark line from your belly button to the pubic area (linea nigra) may appear. This will likely fade after the baby is born.  You may have changes in your hair. These can include thickening of your hair, rapid growth, and changes in texture. Some women also have hair loss during or after pregnancy, or hair that feels dry or thin. Your hair will most likely return to normal after your baby is born.  What to expect at prenatal visits During a routine prenatal visit:  You will be weighed to make sure you and the fetus are growing normally.  Your blood pressure will be taken.  Your abdomen will be measured to track your baby's growth.  The fetal heartbeat will be listened to.  Any test results from the previous visit will be discussed.  Your health care provider may ask you:  How you are feeling.  If you are feeling the baby move.  If you have had any abnormal symptoms, such as leaking fluid, bleeding, severe headaches, or abdominal cramping.  If you are using any tobacco products, including cigarettes, chewing tobacco, and electronic cigarettes.  If you have any questions.  Other tests that may be performed during your second trimester include:  Blood tests that check for: ? Low iron levels (anemia). ? High   blood sugar that affects pregnant women (gestational diabetes) between 42 and 28 weeks. ? Rh antibodies. This is to check for a protein on red blood cells (Rh factor).  Urine tests to check for infections, diabetes, or protein in the urine.  An ultrasound to confirm the proper growth and development of the baby.  An amniocentesis to check for possible genetic problems.  Fetal screens for spina bifida and Down syndrome.  HIV (human immunodeficiency virus) testing. Routine prenatal testing  includes screening for HIV, unless you choose not to have this test.  Follow these instructions at home: Medicines  Follow your health care provider's instructions regarding medicine use. Specific medicines may be either safe or unsafe to take during pregnancy.  Take a prenatal vitamin that contains at least 600 micrograms (mcg) of folic acid.  If you develop constipation, try taking a stool softener if your health care provider approves. Eating and drinking  Eat a balanced diet that includes fresh fruits and vegetables, whole grains, good sources of protein such as meat, eggs, or tofu, and low-fat dairy. Your health care provider will help you determine the amount of weight gain that is right for you.  Avoid raw meat and uncooked cheese. These carry germs that can cause birth defects in the baby.  If you have low calcium intake from food, talk to your health care provider about whether you should take a daily calcium supplement.  Limit foods that are high in fat and processed sugars, such as fried and sweet foods.  To prevent constipation: ? Drink enough fluid to keep your urine clear or pale yellow. ? Eat foods that are high in fiber, such as fresh fruits and vegetables, whole grains, and beans. Activity  Exercise only as directed by your health care provider. Most women can continue their usual exercise routine during pregnancy. Try to exercise for 30 minutes at least 5 days a week. Stop exercising if you experience uterine contractions.  Avoid heavy lifting, wear low heel shoes, and practice good posture.  A sexual relationship may be continued unless your health care provider directs you otherwise. Relieving pain and discomfort  Wear a good support bra to prevent discomfort from breast tenderness.  Take warm sitz baths to soothe any pain or discomfort caused by hemorrhoids. Use hemorrhoid cream if your health care provider approves.  Rest with your legs elevated if you have  leg cramps or low back pain.  If you develop varicose veins, wear support hose. Elevate your feet for 15 minutes, 3-4 times a day. Limit salt in your diet. Prenatal Care  Write down your questions. Take them to your prenatal visits.  Keep all your prenatal visits as told by your health care provider. This is important. Safety  Wear your seat belt at all times when driving.  Make a list of emergency phone numbers, including numbers for family, friends, the hospital, and police and fire departments. General instructions  Ask your health care provider for a referral to a local prenatal education class. Begin classes no later than the beginning of month 6 of your pregnancy.  Ask for help if you have counseling or nutritional needs during pregnancy. Your health care provider can offer advice or refer you to specialists for help with various needs.  Do not use hot tubs, steam rooms, or saunas.  Do not douche or use tampons or scented sanitary pads.  Do not cross your legs for long periods of time.  Avoid cat litter boxes and soil  used by cats. These carry germs that can cause birth defects in the baby and possibly loss of the fetus by miscarriage or stillbirth.  Avoid all smoking, herbs, alcohol, and unprescribed drugs. Chemicals in these products can affect the formation and growth of the baby.  Do not use any products that contain nicotine or tobacco, such as cigarettes and e-cigarettes. If you need help quitting, ask your health care provider.  Visit your dentist if you have not gone yet during your pregnancy. Use a soft toothbrush to brush your teeth and be gentle when you floss. Contact a health care provider if:  You have dizziness.  You have mild pelvic cramps, pelvic pressure, or nagging pain in the abdominal area.  You have persistent nausea, vomiting, or diarrhea.  You have a bad smelling vaginal discharge.  You have pain when you urinate. Get help right away if:  You  have a fever.  You are leaking fluid from your vagina.  You have spotting or bleeding from your vagina.  You have severe abdominal cramping or pain.  You have rapid weight gain or weight loss.  You have shortness of breath with chest pain.  You notice sudden or extreme swelling of your face, hands, ankles, feet, or legs.  You have not felt your baby move in over an hour.  You have severe headaches that do not go away when you take medicine.  You have vision changes. Summary  The second trimester is from week 14 through week 27 (months 4 through 6). It is also a time when the fetus is growing rapidly.  Your body goes through many changes during pregnancy. The changes vary from woman to woman.  Avoid all smoking, herbs, alcohol, and unprescribed drugs. These chemicals affect the formation and growth your baby.  Do not use any tobacco products, such as cigarettes, chewing tobacco, and e-cigarettes. If you need help quitting, ask your health care provider.  Contact your health care provider if you have any questions. Keep all prenatal visits as told by your health care provider. This is important. This information is not intended to replace advice given to you by your health care provider. Make sure you discuss any questions you have with your health care provider. Document Released: 12/10/2001 Document Revised: 05/23/2016 Document Reviewed: 02/16/2013 Elsevier Interactive Patient Education  2017 [employer]   

## 2018-10-28 NOTE — Progress Notes (Signed)
[name] 13+1 wks,measurements c/w dates,crl 80.73 mm,NB present ,NT 1.6 mm,fhr 152 bpm,anterior placenta gr 0,normal ovaries bilat

## 2018-10-30 LAB — INTEGRATED 1
Crown Rump Length: 80.8 mm
Gest. Age on Collection Date: 13.7 weeks
MATERNAL AGE AT EDD: 25.9 a
Nuchal Translucency (NT): 1.6 mm
Number of Fetuses: 1
PAPP-A Value: 1886.1 ng/mL
Weight: 128 [lb_av]

## 2018-11-18 ENCOUNTER — Encounter: Payer: Self-pay | Admitting: Obstetrics and Gynecology

## 2018-11-19 ENCOUNTER — Encounter: Payer: Self-pay | Admitting: Women's Health

## 2018-11-19 ENCOUNTER — Ambulatory Visit (INDEPENDENT_AMBULATORY_CARE_PROVIDER_SITE_OTHER): Payer: Medicaid Other | Admitting: Women's Health

## 2018-11-19 VITALS — BP 134/77 | HR 84 | Wt 132.0 lb

## 2018-11-19 DIAGNOSIS — Z3482 Encounter for supervision of other normal pregnancy, second trimester: Secondary | ICD-10-CM

## 2018-11-19 DIAGNOSIS — Z3A16 16 weeks gestation of pregnancy: Secondary | ICD-10-CM

## 2018-11-19 DIAGNOSIS — Z1379 Encounter for other screening for genetic and chromosomal anomalies: Secondary | ICD-10-CM | POA: Diagnosis not present

## 2018-11-19 DIAGNOSIS — Z1389 Encounter for screening for other disorder: Secondary | ICD-10-CM

## 2018-11-19 DIAGNOSIS — Z363 Encounter for antenatal screening for malformations: Secondary | ICD-10-CM

## 2018-11-19 DIAGNOSIS — Z331 Pregnant state, incidental: Secondary | ICD-10-CM

## 2018-11-19 LAB — POCT URINALYSIS DIPSTICK OB
Blood, UA: NEGATIVE
GLUCOSE, UA: NEGATIVE
Ketones, UA: NEGATIVE
LEUKOCYTES UA: NEGATIVE
NITRITE UA: NEGATIVE
POC,PROTEIN,UA: NEGATIVE

## 2018-11-19 NOTE — Patient Instructions (Signed)
Peggy [name], I greatly value your feedback.  If you receive a survey following your visit with [name] today, we appreciate you taking the time to fill it out.  Thanks, Peggy [name], CNM, WHNP-BC   Second Trimester of Pregnancy The second trimester is from week 14 through week 27 (months 4 through 6). The second trimester is often a time when you feel your best. Your body has adjusted to being pregnant, and you begin to feel better physically. Usually, morning sickness has lessened or quit completely, you may have more energy, and you may have an increase in appetite. The second trimester is also a time when the fetus is growing rapidly. At the end of the sixth month, the fetus is about 9 inches long and weighs about 1 pounds. You will likely begin to feel the baby move (quickening) between 16 and 20 weeks of pregnancy. Body changes during your second trimester Your body continues to go through many changes during your second trimester. The changes vary from woman to woman.  Your weight will continue to increase. You will notice your lower abdomen bulging out.  You may begin to get stretch marks on your hips, abdomen, and breasts.  You may develop headaches that can be relieved by medicines. The medicines should be approved by your health care provider.  You may urinate more often because the fetus is pressing on your bladder.  You may develop or continue to have heartburn as a result of your pregnancy.  You may develop constipation because certain hormones are causing the muscles that push waste through your intestines to slow down.  You may develop hemorrhoids or swollen, bulging veins (varicose veins).  You may have back pain. This is caused by: ? Weight gain. ? Pregnancy hormones that are relaxing the joints in your pelvis. ? A shift in weight and the muscles that support your balance.  Your breasts will continue to grow and they will continue to become tender.  Your gums may  bleed and may be sensitive to brushing and flossing.  Dark spots or blotches (chloasma, mask of pregnancy) may develop on your face. This will likely fade after the baby is born.  A dark line from your belly button to the pubic area (linea nigra) may appear. This will likely fade after the baby is born.  You may have changes in your hair. These can include thickening of your hair, rapid growth, and changes in texture. Some women also have hair loss during or after pregnancy, or hair that feels dry or thin. Your hair will most likely return to normal after your baby is born.  What to expect at prenatal visits During a routine prenatal visit:  You will be weighed to make sure you and the fetus are growing normally.  Your blood pressure will be taken.  Your abdomen will be measured to track your baby's growth.  The fetal heartbeat will be listened to.  Any test results from the previous visit will be discussed.  Your health care provider may ask you:  How you are feeling.  If you are feeling the baby move.  If you have had any abnormal symptoms, such as leaking fluid, bleeding, severe headaches, or abdominal cramping.  If you are using any tobacco products, including cigarettes, chewing tobacco, and electronic cigarettes.  If you have any questions.  Other tests that may be performed during your second trimester include:  Blood tests that check for: ? Low iron levels (anemia). ? High  blood sugar that affects pregnant women (gestational diabetes) between 42 and 28 weeks. ? Rh antibodies. This is to check for a protein on red blood cells (Rh factor).  Urine tests to check for infections, diabetes, or protein in the urine.  An ultrasound to confirm the proper growth and development of the baby.  An amniocentesis to check for possible genetic problems.  Fetal screens for spina bifida and Down syndrome.  HIV (human immunodeficiency virus) testing. Routine prenatal testing  includes screening for HIV, unless you choose not to have this test.  Follow these instructions at home: Medicines  Follow your health care provider's instructions regarding medicine use. Specific medicines may be either safe or unsafe to take during pregnancy.  Take a prenatal vitamin that contains at least 600 micrograms (mcg) of folic acid.  If you develop constipation, try taking a stool softener if your health care provider approves. Eating and drinking  Eat a balanced diet that includes fresh fruits and vegetables, whole grains, good sources of protein such as meat, eggs, or tofu, and low-fat dairy. Your health care provider will help you determine the amount of weight gain that is right for you.  Avoid raw meat and uncooked cheese. These carry germs that can cause birth defects in the baby.  If you have low calcium intake from food, talk to your health care provider about whether you should take a daily calcium supplement.  Limit foods that are high in fat and processed sugars, such as fried and sweet foods.  To prevent constipation: ? Drink enough fluid to keep your urine clear or pale yellow. ? Eat foods that are high in fiber, such as fresh fruits and vegetables, whole grains, and beans. Activity  Exercise only as directed by your health care provider. Most women can continue their usual exercise routine during pregnancy. Try to exercise for 30 minutes at least 5 days a week. Stop exercising if you experience uterine contractions.  Avoid heavy lifting, wear low heel shoes, and practice good posture.  A sexual relationship may be continued unless your health care provider directs you otherwise. Relieving pain and discomfort  Wear a good support bra to prevent discomfort from breast tenderness.  Take warm sitz baths to soothe any pain or discomfort caused by hemorrhoids. Use hemorrhoid cream if your health care provider approves.  Rest with your legs elevated if you have  leg cramps or low back pain.  If you develop varicose veins, wear support hose. Elevate your feet for 15 minutes, 3-4 times a day. Limit salt in your diet. Prenatal Care  Write down your questions. Take them to your prenatal visits.  Keep all your prenatal visits as told by your health care provider. This is important. Safety  Wear your seat belt at all times when driving.  Make a list of emergency phone numbers, including numbers for family, friends, the hospital, and police and fire departments. General instructions  Ask your health care provider for a referral to a local prenatal education class. Begin classes no later than the beginning of month 6 of your pregnancy.  Ask for help if you have counseling or nutritional needs during pregnancy. Your health care provider can offer advice or refer you to specialists for help with various needs.  Do not use hot tubs, steam rooms, or saunas.  Do not douche or use tampons or scented sanitary pads.  Do not cross your legs for long periods of time.  Avoid cat litter boxes and soil  used by cats. These carry germs that can cause birth defects in the baby and possibly loss of the fetus by miscarriage or stillbirth.  Avoid all smoking, herbs, alcohol, and unprescribed drugs. Chemicals in these products can affect the formation and growth of the baby.  Do not use any products that contain nicotine or tobacco, such as cigarettes and e-cigarettes. If you need help quitting, ask your health care provider.  Visit your dentist if you have not gone yet during your pregnancy. Use a soft toothbrush to brush your teeth and be gentle when you floss. Contact a health care provider if:  You have dizziness.  You have mild pelvic cramps, pelvic pressure, or nagging pain in the abdominal area.  You have persistent nausea, vomiting, or diarrhea.  You have a bad smelling vaginal discharge.  You have pain when you urinate. Get help right away if:  You  have a fever.  You are leaking fluid from your vagina.  You have spotting or bleeding from your vagina.  You have severe abdominal cramping or pain.  You have rapid weight gain or weight loss.  You have shortness of breath with chest pain.  You notice sudden or extreme swelling of your face, hands, ankles, feet, or legs.  You have not felt your baby move in over an hour.  You have severe headaches that do not go away when you take medicine.  You have vision changes. Summary  The second trimester is from week 14 through week 27 (months 4 through 6). It is also a time when the fetus is growing rapidly.  Your body goes through many changes during pregnancy. The changes vary from woman to woman.  Avoid all smoking, herbs, alcohol, and unprescribed drugs. These chemicals affect the formation and growth your baby.  Do not use any tobacco products, such as cigarettes, chewing tobacco, and e-cigarettes. If you need help quitting, ask your health care provider.  Contact your health care provider if you have any questions. Keep all prenatal visits as told by your health care provider. This is important. This information is not intended to replace advice given to you by your health care provider. Make sure you discuss any questions you have with your health care provider. Document Released: 12/10/2001 Document Revised: 05/23/2016 Document Reviewed: 02/16/2013 Elsevier Interactive Patient Education  2017 [employer].

## 2018-11-19 NOTE — Progress Notes (Signed)
   LOW-RISK PREGNANCY VISIT Patient name: Peggy [name] MRN [id number]  Date of birth: 1991/04/16 Chief Complaint:   Routine Prenatal Visit  History of Present Illness:   Peggy [name] is a 27 y.o. [phone] female at [redacted]w[redacted]d with an Estimated Date of Delivery: 05/04/19 being seen today for ongoing management of a low-risk pregnancy.  Today she reports a lot of pressure, concerned about PTL. Denies uti s/s, vb, lof. Denies abnormal discharge, itching/odor/irritation.    . Vag. Bleeding: None.  Movement: Absent. denies leaking of fluid. Review of Systems:   Pertinent items are noted in HPI Denies abnormal vaginal discharge w/ itching/odor/irritation, headaches, visual changes, shortness of breath, chest pain, abdominal pain, severe nausea/vomiting, or problems with urination or bowel movements unless otherwise stated above. Pertinent History Reviewed:  Reviewed past medical,surgical, social, obstetrical and family history.  Reviewed problem list, medications and allergies. Physical Assessment:   Vitals:   11/19/18 1114  BP: 134/77  Pulse: 84  Weight: 132 lb (59.9 kg)  Body mass index is 25.78 kg/m.        Physical Examination:   General appearance: Well appearing, and in no distress  Mental status: Alert, oriented to person, place, and time  Skin: Warm & dry  Cardiovascular: Normal heart rate noted  Respiratory: Normal respiratory effort, no distress  Abdomen: Soft, gravid, nontender  Pelvic: spec exam: cx visually long/closed, SVE: LTC         Extremities: Edema: Trace  Fetal Status:     Movement: Absent    Results for orders placed or performed in visit on 11/19/18 (from the past 24 hour(s))  POC Urinalysis Dipstick OB   Collection Time: 11/19/18 11:18 AM  Result Value Ref Range   Color, UA     Clarity, UA     Glucose, UA Negative Negative   Bilirubin, UA     Ketones, UA neg    Spec Grav, UA     Blood, UA neg    pH, UA     POC,PROTEIN,UA Negative Negative, Trace,  Small (1+), Moderate (2+), Large (3+), 4+   Urobilinogen, UA     Nitrite, UA neg    Leukocytes, UA Negative Negative   Appearance     Odor      Assessment & Plan:  1) Low-risk pregnancy G4P3003 at [redacted]w[redacted]d with an Estimated Date of Delivery: 05/04/19   2) Pressure, cx LTC   Meds: No orders of the defined types were placed in this encounter.  Labs/procedures today: 2nd IT  Plan:  Continue routine obstetrical care   Reviewed: Preterm labor symptoms and general obstetric precautions including but not limited to vaginal bleeding, contractions, leaking of fluid and fetal movement were reviewed in detail with the patient.  All questions were answered  Follow-up: Return in about 2 weeks (around 12/03/2018) for LROB, LH:[id number].  Orders Placed This Encounter  Procedures  . US OB Comp + 14 Wk  . POC Urinalysis Dipstick OB   Roma Schanz CNM, [facility] 11/19/2018 11:28 AM

## 2018-11-19 NOTE — Addendum Note (Signed)
Addended by: Christiana Pellant A on: 11/19/2018 11:31 AM   Modules accepted: Orders

## 2018-11-22 LAB — INTEGRATED 2
AFP MOM: 0.6
Alpha-Fetoprotein: 23.1 ng/mL
CROWN RUMP LENGTH: 80.8 mm
DIA MoM: 2.11
DIA Value: 380.9 pg/mL
ESTRIOL UNCONJUGATED: 1.05 ng/mL
Gest. Age on Collection Date: 13.7 weeks
Gestational Age: 16.9 weeks
HCG VALUE: 70.9 [IU]/mL
MATERNAL AGE AT EDD: 25.9 a
NUMBER OF FETUSES: 1
Nuchal Translucency (NT): 1.6 mm
Nuchal Translucency MoM: 0.84
PAPP-A MoM: 1.07
PAPP-A Value: 1886.1 ng/mL
Test Results:: NEGATIVE
Weight: 128 [lb_av]
Weight: 128 [lb_av]
hCG MoM: 2.15
uE3 MoM: 0.97

## 2018-12-03 ENCOUNTER — Encounter: Payer: Self-pay | Admitting: Obstetrics & Gynecology

## 2018-12-03 ENCOUNTER — Other Ambulatory Visit: Payer: Self-pay

## 2018-12-03 ENCOUNTER — Ambulatory Visit (INDEPENDENT_AMBULATORY_CARE_PROVIDER_SITE_OTHER): Payer: Medicaid Other

## 2018-12-03 ENCOUNTER — Ambulatory Visit (INDEPENDENT_AMBULATORY_CARE_PROVIDER_SITE_OTHER): Payer: Medicaid Other | Admitting: Obstetrics & Gynecology

## 2018-12-03 VITALS — BP 130/82 | HR 93 | Wt 139.0 lb

## 2018-12-03 DIAGNOSIS — Z3482 Encounter for supervision of other normal pregnancy, second trimester: Secondary | ICD-10-CM

## 2018-12-03 DIAGNOSIS — Z3A18 18 weeks gestation of pregnancy: Secondary | ICD-10-CM

## 2018-12-03 DIAGNOSIS — Z331 Pregnant state, incidental: Secondary | ICD-10-CM

## 2018-12-03 DIAGNOSIS — Z1389 Encounter for screening for other disorder: Secondary | ICD-10-CM

## 2018-12-03 DIAGNOSIS — Z363 Encounter for antenatal screening for malformations: Secondary | ICD-10-CM

## 2018-12-03 LAB — POCT URINALYSIS DIPSTICK OB
GLUCOSE, UA: NEGATIVE
Ketones, UA: NEGATIVE
LEUKOCYTES UA: NEGATIVE
Nitrite, UA: NEGATIVE
POC,PROTEIN,UA: NEGATIVE
RBC UA: NEGATIVE

## 2018-12-03 NOTE — Progress Notes (Signed)
[name] 18+2 wks,breech,anterior placenta gr 0,cx 3.4 cm,svp of fluid 5.2 cm,fhr 138 bpm,EFW 284 g 93%,anatomy complete

## 2018-12-03 NOTE — Progress Notes (Signed)
   LOW-RISK PREGNANCY VISIT Patient name: Peggy [name] MRN [id number]  Date of birth: Jul 11, 1991 Chief Complaint:   Routine Prenatal Visit (u/s today)  History of Present Illness:   Peggy [name] is a 27 y.o. [phone] female at [redacted]w[redacted]d with an Estimated Date of Delivery: 05/04/19 being seen today for ongoing management of a low-risk pregnancy.  Today she reports no complaints. Contractions: Irritability. Vag. Bleeding: None.  Movement: Present. denies leaking of fluid. Review of Systems:   Pertinent items are noted in HPI Denies abnormal vaginal discharge w/ itching/odor/irritation, headaches, visual changes, shortness of breath, chest pain, abdominal pain, severe nausea/vomiting, or problems with urination or bowel movements unless otherwise stated above. Pertinent History Reviewed:  Reviewed past medical,surgical, social, obstetrical and family history.  Reviewed problem list, medications and allergies. Physical Assessment:   Vitals:   12/03/18 1414  BP: 130/82  Pulse: 93  Weight: 139 lb (63 kg)  Body mass index is 27.15 kg/m.        Physical Examination:   General appearance: Well appearing, and in no distress  Mental status: Alert, oriented to person, place, and time  Skin: Warm & dry  Cardiovascular: Normal heart rate noted  Respiratory: Normal respiratory effort, no distress  Abdomen: Soft, gravid, nontender  Pelvic: Cervical exam deferred         Extremities: Edema: None  Fetal Status: Fetal Heart Rate (bpm): 138   Movement: Present    Results for orders placed or performed in visit on 12/03/18 (from the past 24 hour(s))  POC Urinalysis Dipstick OB   Collection Time: 12/03/18  2:14 PM  Result Value Ref Range   Color, UA     Clarity, UA     Glucose, UA Negative Negative   Bilirubin, UA     Ketones, UA neg    Spec Grav, UA     Blood, UA neg    pH, UA     POC,PROTEIN,UA Negative Negative, Trace, Small (1+), Moderate (2+), Large (3+), 4+   Urobilinogen, UA      Nitrite, UA neg    Leukocytes, UA Negative Negative   Appearance     Odor      Assessment & Plan:  1) Low-risk pregnancy G4P3003 at [redacted]w[redacted]d with an Estimated Date of Delivery: 05/04/19   2)Hx of GDM   Meds: No orders of the defined types were placed in this encounter.  Labs/procedures today:   Plan:  Continue routine obstetrical care   Reviewed: Preterm labor symptoms and general obstetric precautions including but not limited to vaginal bleeding, contractions, leaking of fluid and fetal movement were reviewed in detail with the patient.  All questions were answered  Follow-up: Return in about 4 weeks (around 12/31/2018) for [place].  Orders Placed This Encounter  Procedures  . POC Urinalysis Dipstick OB   Mertie Clause Paden Senger  12/03/2018 2:42 PM

## 2018-12-16 ENCOUNTER — Telehealth: Payer: Self-pay | Admitting: *Deleted

## 2018-12-16 DIAGNOSIS — A599 Trichomoniasis, unspecified: Secondary | ICD-10-CM | POA: Diagnosis not present

## 2018-12-16 NOTE — Telephone Encounter (Signed)
Patient informed Metronidazole was fine to take during pregnancy.

## 2018-12-18 ENCOUNTER — Telehealth: Payer: Self-pay | Admitting: Women's Health

## 2018-12-18 NOTE — Telephone Encounter (Signed)
Attempted to call patient back. No answer. Will send mychart message.

## 2018-12-18 NOTE — Telephone Encounter (Signed)
Pt is 20 weeks and needs to talk to someone about getting some meds called in . Urgent care put her on antibodic and she needs something called in for a yeast infection. She does not have yet but know she will . [facility] [address] [facility]

## 2018-12-30 NOTE — L&D Delivery Note (Signed)
Patient arrived to unit, from MAU, at 9.5cm with urge to push.  Reports contractions have been on/off for the past day, but became regular at 11pm.  Patient denies ROM and endorses fetal movement prior to arrival. FHR reassuring and patient delivered as below with staff and FOB support.   Delivery Note At 5:32 AM, on April 26, 2019 a viable female "[name]" was delivered via Vaginal, Spontaneous (Presentation: Left Occiput Anterior with manual restitution to [place]). Shoulders delivered easily and infant with flaccid tone and mi cry. Tactile stimulation given by provider and infant placed on mother's abdomen where nurse gave vigourous tactile stimulation and provider gave bulb suction. Infant APGAR: 8, 9. Cord clamped, cut, and blood collected. Placenta delivered spontaneously and noted to be intact with 3VC upon inspection. 10U Pitocin given IM after delivery of placenta. Vaginal inspection revealed no lacerations.  Fundus firm, at the umbilicus, and bleeding scant to none.  Mother hemodynamically stable and infant skin to skin prior to provider exit.  Mother unsure of birth control method, but is considering IUD and is agreeable to Depo Provera prior to discharge.  She opts to breastfeed and plans for infant to be circumcised at [facility].  Infant weight at one hour of life: 7lbs 9.7oz, 20 in  Anesthesia:  None Episiotomy: None Lacerations: None Suture Repair: N/A Est. Blood Loss (mL): 2  Mom to postpartum.  Baby to Couplet care / Skin to Skin.  Maryann Conners MSN, CNM 04/26/2019, 6:08 AM

## 2018-12-31 ENCOUNTER — Ambulatory Visit (INDEPENDENT_AMBULATORY_CARE_PROVIDER_SITE_OTHER): Payer: Medicaid Other | Admitting: Women's Health

## 2018-12-31 ENCOUNTER — Encounter: Payer: Self-pay | Admitting: Women's Health

## 2018-12-31 VITALS — BP 137/86 | HR 104 | Wt 139.0 lb

## 2018-12-31 DIAGNOSIS — Z3482 Encounter for supervision of other normal pregnancy, second trimester: Secondary | ICD-10-CM

## 2018-12-31 DIAGNOSIS — Z1389 Encounter for screening for other disorder: Secondary | ICD-10-CM

## 2018-12-31 DIAGNOSIS — Z331 Pregnant state, incidental: Secondary | ICD-10-CM

## 2018-12-31 DIAGNOSIS — Z3A22 22 weeks gestation of pregnancy: Secondary | ICD-10-CM

## 2018-12-31 LAB — POCT URINALYSIS DIPSTICK OB
Glucose, UA: NEGATIVE
Ketones, UA: NEGATIVE
Leukocytes, UA: NEGATIVE
Nitrite, UA: NEGATIVE
PROTEIN: NEGATIVE
RBC UA: NEGATIVE

## 2018-12-31 NOTE — Patient Instructions (Signed)
Peggy [name], I greatly value your feedback.  If you receive a survey following your visit with [name] today, we appreciate you taking the time to fill it out.  Thanks, Knute Neu, CNM, WHNP-BC   You will have your sugar test next visit.  Please do not eat or drink anything after midnight the night before you come, not even water.  You will be here for at least two hours.     Call the office [phone]) or go to [facility] if:  You begin to have strong, frequent contractions  Your water breaks.  Sometimes it is a big gush of fluid, sometimes it is just a trickle that keeps getting your panties wet or running down your legs  You have vaginal bleeding.  It is normal to have a small amount of spotting if your cervix was checked.   You don't feel your baby moving like normal.  If you don't, get you something to eat and drink and lay down and focus on feeling your baby move.   If your baby is still not moving like normal, you should call the office or go to [place] [facility] P[facility] The second trimester is from week 13 through week 28, months 4 through 6. The second trimester is often a time when you feel your best. Your body has also adjusted to being pregnant, and you begin to feel better physically. Usually, morning sickness has lessened or quit completely, you may have more energy, and you may have an increase in appetite. The second trimester is also a time when the fetus is growing rapidly. At the end of the sixth month, the fetus is about 9 inches long and weighs about 1 pounds. You will likely begin to feel the baby move (quickening) between 18 and 20 weeks of the pregnancy. BODY CHANGES Your body goes through many changes during pregnancy. The changes vary from woman to woman.   Your weight will continue to increase. You will notice your lower abdomen bulging out.  You may begin to get stretch marks on your hips, abdomen, and breasts.  You may develop  headaches that can be relieved by medicines approved by your health care provider.  You may urinate more often because the fetus is pressing on your bladder.  You may develop or continue to have heartburn as a result of your pregnancy.  You may develop constipation because certain hormones are causing the muscles that push waste through your intestines to slow down.  You may develop hemorrhoids or swollen, bulging veins (varicose veins).  You may have back pain because of the weight gain and pregnancy hormones relaxing your joints between the bones in your pelvis and as a result of a shift in weight and the muscles that support your balance.  Your breasts will continue to grow and be tender.  Your gums may bleed and may be sensitive to brushing and flossing.  Dark spots or blotches (chloasma, mask of pregnancy) may develop on your face. This will likely fade after the baby is born.  A dark line from your belly button to the pubic area (linea nigra) may appear. This will likely fade after the baby is born.  You may have changes in your hair. These can include thickening of your hair, rapid growth, and changes in texture. Some women also have hair loss during or after pregnancy, or hair that feels dry or thin. Your hair will most likely return to normal after your  baby is born. WHAT TO EXPECT AT YOUR PRENATAL VISITS During a routine prenatal visit:  You will be weighed to make sure you and the fetus are growing normally.  Your blood pressure will be taken.  Your abdomen will be measured to track your baby's growth.  The fetal heartbeat will be listened to.  Any test results from the previous visit will be discussed. Your health care provider may ask you:  How you are feeling.  If you are feeling the baby move.  If you have had any abnormal symptoms, such as leaking fluid, bleeding, severe headaches, or abdominal cramping.  If you have any questions. Other tests that may be  performed during your second trimester include:  Blood tests that check for:  Low iron levels (anemia).  Gestational diabetes (between 24 and 28 weeks).  Rh antibodies.  Urine tests to check for infections, diabetes, or protein in the urine.  An ultrasound to confirm the proper growth and development of the baby.  An amniocentesis to check for possible genetic problems.  Fetal screens for spina bifida and Down syndrome. HOME CARE INSTRUCTIONS   Avoid all smoking, herbs, alcohol, and unprescribed drugs. These chemicals affect the formation and growth of the baby.  Follow your health care provider's instructions regarding medicine use. There are medicines that are either safe or unsafe to take during pregnancy.  Exercise only as directed by your health care provider. Experiencing uterine cramps is a good sign to stop exercising.  Continue to eat regular, healthy meals.  Wear a good support bra for breast tenderness.  Do not use hot tubs, steam rooms, or saunas.  Wear your seat belt at all times when driving.  Avoid raw meat, uncooked cheese, cat litter boxes, and soil used by cats. These carry germs that can cause birth defects in the baby.  Take your prenatal vitamins.  Try taking a stool softener (if your health care provider approves) if you develop constipation. Eat more high-fiber foods, such as fresh vegetables or fruit and whole grains. Drink plenty of fluids to keep your urine clear or pale yellow.  Take warm sitz baths to soothe any pain or discomfort caused by hemorrhoids. Use hemorrhoid cream if your health care provider approves.  If you develop varicose veins, wear support hose. Elevate your feet for 15 minutes, 3-4 times a day. Limit salt in your diet.  Avoid heavy lifting, wear low heel shoes, and practice good posture.  Rest with your legs elevated if you have leg cramps or low back pain.  Visit your dentist if you have not gone yet during your pregnancy.  Use a soft toothbrush to brush your teeth and be gentle when you floss.  A sexual relationship may be continued unless your health care provider directs you otherwise.  Continue to go to all your prenatal visits as directed by your health care provider. SEEK MEDICAL CARE IF:   You have dizziness.  You have mild pelvic cramps, pelvic pressure, or nagging pain in the abdominal area.  You have persistent nausea, vomiting, or diarrhea.  You have a bad smelling vaginal discharge.  You have pain with urination. SEEK IMMEDIATE MEDICAL CARE IF:   You have a fever.  You are leaking fluid from your vagina.  You have spotting or bleeding from your vagina.  You have severe abdominal cramping or pain.  You have rapid weight gain or loss.  You have shortness of breath with chest pain.  You notice sudden or extreme   swelling of your face, hands, ankles, feet, or legs.  You have not felt your baby move in over an hour.  You have severe headaches that do not go away with medicine.  You have vision changes. Document Released: 12/10/2001 Document Revised: 12/21/2013 Document Reviewed: 02/16/2013 [facility] Patient Information 2015 [place], [place]. This information is not intended to replace advice given to you by your health care provider. Make sure you discuss any questions you have with your health care provider.

## 2018-12-31 NOTE — Progress Notes (Signed)
   LOW-RISK PREGNANCY VISIT Patient name: Peggy [name] MRN [id number]  Date of birth: 09/22/1991 Chief Complaint:   Routine Prenatal Visit  History of Present Illness:   Peggy [name] is a 28 y.o. [phone] female at [redacted]w[redacted]d with an Estimated Date of Delivery: 05/04/19 being seen today for ongoing management of a low-risk pregnancy.  Today she reports no complaints. Contractions: Not present. Vag. Bleeding: None.  Movement: Present. denies leaking of fluid. Review of Systems:   Pertinent items are noted in HPI Denies abnormal vaginal discharge w/ itching/odor/irritation, headaches, visual changes, shortness of breath, chest pain, abdominal pain, severe nausea/vomiting, or problems with urination or bowel movements unless otherwise stated above. Pertinent History Reviewed:  Reviewed past medical,surgical, social, obstetrical and family history.  Reviewed problem list, medications and allergies. Physical Assessment:   Vitals:   12/31/18 1344  BP: 137/86  Pulse: (!) 104  Weight: 139 lb (63 kg)  Body mass index is 27.15 kg/m.        Physical Examination:   General appearance: Well appearing, and in no distress  Mental status: Alert, oriented to person, place, and time  Skin: Warm & dry  Cardiovascular: Normal heart rate noted  Respiratory: Normal respiratory effort, no distress  Abdomen: Soft, gravid, nontender  Pelvic: Cervical exam deferred         Extremities: Edema: None  Fetal Status: Fetal Heart Rate (bpm): 148 Fundal Height: 24 cm Movement: Present    Results for orders placed or performed in visit on 12/31/18 (from the past 24 hour(s))  POC Urinalysis Dipstick OB   Collection Time: 12/31/18  1:47 PM  Result Value Ref Range   Color, UA     Clarity, UA     Glucose, UA Negative Negative   Bilirubin, UA     Ketones, UA neg    Spec Grav, UA     Blood, UA neg    pH, UA     POC,PROTEIN,UA Negative Negative, Trace, Small (1+), Moderate (2+), Large (3+), 4+   Urobilinogen, UA     Nitrite, UA neg    Leukocytes, UA Negative Negative   Appearance     Odor      Assessment & Plan:  1) Low-risk pregnancy [id number] at [redacted]w[redacted]d with an Estimated Date of Delivery: 05/04/19    Meds: No orders of the defined types were placed in this encounter.  Labs/procedures today: none  Plan:  Continue routine obstetrical care   Reviewed: Preterm labor symptoms and general obstetric precautions including but not limited to vaginal bleeding, contractions, leaking of fluid and fetal movement were reviewed in detail with the patient.  All questions were answered  Follow-up: Return in about 4 weeks (around 01/28/2019) for LROB, PN2.  Orders Placed This Encounter  Procedures  . POC Urinalysis Dipstick OB   Roma Schanz CNM, [facility] 12/31/2018 2:03 PM

## 2019-01-13 ENCOUNTER — Other Ambulatory Visit: Payer: Self-pay | Admitting: Advanced Practice Midwife

## 2019-01-13 MED ORDER — ALBUTEROL SULFATE HFA 108 (90 BASE) MCG/ACT IN AERS: 2.0000 | INHALATION_SPRAY | Freq: Four times a day (QID) | RESPIRATORY_TRACT | 2 refills | Status: DC | PRN

## 2019-01-13 NOTE — Progress Notes (Signed)
Albuterol inhaler sent to [facility]

## 2019-01-18 ENCOUNTER — Emergency Department (HOSPITAL_COMMUNITY)
Admission: EM | Admit: 2019-01-18 | Discharge: 2019-01-18 | Disposition: A | Discharge status: Home / Self Care | Payer: Medicaid Other | Attending: Emergency Medicine | Admitting: Emergency Medicine

## 2019-01-18 ENCOUNTER — Emergency Department (HOSPITAL_COMMUNITY): Payer: Medicaid Other

## 2019-01-18 ENCOUNTER — Encounter (HOSPITAL_COMMUNITY): Payer: Self-pay | Admitting: Emergency Medicine

## 2019-01-18 DIAGNOSIS — Z3A24 24 weeks gestation of pregnancy: Secondary | ICD-10-CM | POA: Diagnosis not present

## 2019-01-18 DIAGNOSIS — Y9389 Activity, other specified: Secondary | ICD-10-CM | POA: Diagnosis not present

## 2019-01-18 DIAGNOSIS — F17210 Nicotine dependence, cigarettes, uncomplicated: Secondary | ICD-10-CM | POA: Insufficient documentation

## 2019-01-18 DIAGNOSIS — W108XXA Fall (on) (from) other stairs and steps, initial encounter: Secondary | ICD-10-CM | POA: Diagnosis not present

## 2019-01-18 DIAGNOSIS — O9952 Diseases of the respiratory system complicating childbirth: Secondary | ICD-10-CM | POA: Insufficient documentation

## 2019-01-18 DIAGNOSIS — W19XXXA Unspecified fall, initial encounter: Secondary | ICD-10-CM

## 2019-01-18 DIAGNOSIS — O99332 Smoking (tobacco) complicating pregnancy, second trimester: Secondary | ICD-10-CM | POA: Insufficient documentation

## 2019-01-18 DIAGNOSIS — Y999 Unspecified external cause status: Secondary | ICD-10-CM | POA: Insufficient documentation

## 2019-01-18 DIAGNOSIS — O9989 Other specified diseases and conditions complicating pregnancy, childbirth and the puerperium: Secondary | ICD-10-CM | POA: Diagnosis present

## 2019-01-18 DIAGNOSIS — S40012A Contusion of left shoulder, initial encounter: Secondary | ICD-10-CM | POA: Diagnosis not present

## 2019-01-18 DIAGNOSIS — O24419 Gestational diabetes mellitus in pregnancy, unspecified control: Secondary | ICD-10-CM | POA: Insufficient documentation

## 2019-01-18 DIAGNOSIS — S4992XA Unspecified injury of left shoulder and upper arm, initial encounter: Secondary | ICD-10-CM | POA: Diagnosis not present

## 2019-01-18 DIAGNOSIS — J45909 Unspecified asthma, uncomplicated: Secondary | ICD-10-CM | POA: Insufficient documentation

## 2019-01-18 DIAGNOSIS — O9A212 Injury, poisoning and certain other consequences of external causes complicating pregnancy, second trimester: Secondary | ICD-10-CM | POA: Diagnosis not present

## 2019-01-18 DIAGNOSIS — Y92009 Unspecified place in unspecified non-institutional (private) residence as the place of occurrence of the external cause: Secondary | ICD-10-CM | POA: Diagnosis not present

## 2019-01-18 NOTE — ED Provider Notes (Signed)
[facility] EMERGENCY DEPARTMENT Provider Note   CSN: [id number] Arrival date & time: 01/18/19  1013     History   Chief Complaint Chief Complaint  Patient presents with  . Fall    HPI Peggy [name] is a 28 y.o. female.  Patient is approximately [redacted] weeks pregnant.  Her estimated due date is May 04, 2019.  [facility]  Seen by them on January 2.  Patient's gravida 4 para 3.  Patient was stepping off her steps at her Apt. 1 step and fell onto her left side injuring and bruising her left shoulder.  A little bit of discomfort to her left hip.  Denies any vaginal bleeding.  But has felt that there has been decreased fetal movement since the fall.  No anterior abdominal pain.  No low back pain.     Past Medical History:  Diagnosis Date  . Asthma   . Gestational diabetes   . Pyloric stenosis     Patient Active Problem List   Diagnosis Date Noted  . History of gestational diabetes 10/28/2018  . Supervision of normal pregnancy 09/23/2018  . Low vitamin D level 02/12/2017  . Smoker 07/25/2014    Past Surgical History:  Procedure Laterality Date  . PYLOROMYOTOMY       OB History    Gravida  4   Para  3   Term  3   Preterm  0   AB  0   Living  3     SAB  0   TAB  0   Ectopic  0   Multiple  0   Live Births  3            Home Medications    Prior to Admission medications   Medication Sig Start Date End Date Taking? Authorizing Provider  acetaminophen (TYLENOL) 325 MG tablet Take 650 mg by mouth every 6 (six) hours as needed for mild pain or headache.    [provider]  albuterol (PROVENTIL HFA;VENTOLIN HFA) 108 (90 Base) MCG/ACT inhaler Inhale 2 puffs into the lungs every 6 (six) hours as needed for wheezing or shortness of breath. 01/13/19   Cresenzo-Dishmon, Joaquim Lai, CNM  albuterol (PROVENTIL) (2.5 MG/3ML) 0.083% nebulizer solution Take 3 mLs (2.5 mg total) by nebulization every 4 (four) hours as needed for wheezing  or shortness of breath. 10/22/18   Orlie Dakin, MD  Doxylamine-Pyridoxine (DICLEGIS) 10-10 MG TBEC Take 2 qhs; may also take one in am and one in afternoon prn nausea 09/23/18   Cresenzo-Dishmon, Joaquim Lai, CNM  promethazine (PHENERGAN) 12.5 MG tablet Take 1 tablet (12.5 mg total) by mouth every 6 (six) hours as needed for nausea or vomiting. 10/19/18   Roma Schanz, CNM    Family History Family History  Problem Relation Age of Onset  . Arthritis Mother   . Cancer Mother        thyroid  . Depression Mother   . Hyperlipidemia Father   . Asthma Father   . Hypertension Father   . Heart disease Father   . COPD Maternal Grandfather   . Diabetes Paternal Grandmother   . Arthritis Paternal Grandmother   . Asthma Paternal Grandmother   . Heart disease Paternal Grandmother   . Depression Paternal Grandmother   . Hypertension Paternal Grandmother   . Hyperlipidemia Paternal Grandmother   . Other Neg Hx     Social History Social History   Tobacco Use  . Smoking status: Current Every  Day Smoker    Packs/day: 0.25    Years: 5.00    Pack years: 1.25    Types: Cigarettes  . Smokeless tobacco: Never Used  . Tobacco comment: 2 cig/day  Substance Use Topics  . Alcohol use: No  . Drug use: No     Allergies   Patient has no known allergies.   Review of Systems Review of Systems  Constitutional: Negative for chills and fever.  HENT: Positive for congestion. Negative for rhinorrhea and sore throat.   Eyes: Negative for visual disturbance.  Respiratory: Negative for cough and shortness of breath.   Cardiovascular: Negative for chest pain and leg swelling.  Gastrointestinal: Negative for abdominal pain, diarrhea, nausea and vomiting.  Genitourinary: Negative for dysuria, pelvic pain and vaginal bleeding.  Musculoskeletal: Negative for back pain and neck pain.  Skin: Negative for rash.  Neurological: Positive for light-headedness. Negative for dizziness, syncope and  headaches.  Hematological: Does not bruise/bleed easily.  Psychiatric/Behavioral: Negative for confusion.     Physical Exam Updated Vital Signs Ht 1.549 m (5\' 1" )   Wt 63 kg   BMI 26.24 kg/m   Physical Exam Vitals signs and nursing note reviewed.  Constitutional:      General: She is not in acute distress.    Appearance: She is well-developed.  HENT:     Head: Normocephalic and atraumatic.     Mouth/Throat:     Mouth: Mucous membranes are moist.  Eyes:     Extraocular Movements: Extraocular movements intact.     Conjunctiva/sclera: Conjunctivae normal.     Pupils: Pupils are equal, round, and reactive to light.  Neck:     Musculoskeletal: Neck supple.  Cardiovascular:     Rate and Rhythm: Normal rate and regular rhythm.     Heart sounds: No murmur.  Pulmonary:     Effort: Pulmonary effort is normal. No respiratory distress.     Breath sounds: Normal breath sounds.  Abdominal:     Palpations: Abdomen is soft.     Tenderness: There is no abdominal tenderness.  Musculoskeletal: Normal range of motion.     Comments: Round bruise to left shoulder area.  Measuring about 7 cm.  No obvious deformity.  Radial pulse distally 2+.  Good movement at fingers wrist and elbow.  Good movement of both lower extremities.  No deformity to left hip.  Skin:    General: Skin is warm and dry.     Capillary Refill: Capillary refill takes less than 2 seconds.     Findings: Bruising present.  Neurological:     General: No focal deficit present.     Mental Status: She is alert and oriented to person, place, and time.      ED Treatments / Results  Labs (all labs ordered are listed, but only abnormal results are displayed) Labs Reviewed - No data to display  EKG None  Radiology Dg Shoulder Left  Result Date: 01/18/2019 CLINICAL DATA:  [place] down 2 steps yesterday, pain and bruising LEFT shoulder, [redacted] weeks pregnant EXAM: LEFT SHOULDER - 2+ VIEW COMPARISON:  07/06/2009 FINDINGS: Osseous  demineralization. AC joint alignment normal. Visualized LEFT ribs intact. No acute fracture, dislocation, or bone destruction. IMPRESSION: No acute osseous abnormalities. Electronically Signed   By: Lavonia Dana M.D.   On: 01/18/2019 11:48    Procedures Procedures (including critical care time)  Medications Ordered in ED Medications - No data to display   Initial Impression / Assessment and Plan / ED Course  I have reviewed the triage vital signs and the nursing notes.  Pertinent labs & imaging results that were available during my care of the patient were reviewed by me and considered in my medical decision making (see chart for details).    Patient status post a fall from one step yesterday landing on her left shoulder and left hip.  Patient mostly concerned about the left shoulder where she has a pretty good bruise.  Also patient states she is had an upper respiratory infection the past few days.  [facility] seen by them at the beginning of January.  Based on their notes she would be [redacted] weeks pregnant currently due date is May 04, 2019.  She is a gravida 4 para 3.  Patient denies any vaginal bleeding or any anterior abdominal pain.  However she felt that there has been a decrease in fetal movement.  And she was concerned.  Patient initially we tried fetal monitoring.  Working through [employer] maternal unit.  Unable to get that to cooperate.  So bedside ultrasound was done and there was good fetal heart tone and movement.  The ultrasound was done by nursing.  OB nursing did make note that there was a little bit of decreased amniotic fluid.  But nothing serious.  Patient did not require any further monitoring.  X-ray of the left shoulder shows no bony injuries.  We will discharge patient home recommend that she increase fluids and give a call to [facility] and update them about her visit today.  Patient will return for any new or worse  symptoms.   Final Clinical Impressions(s) / ED Diagnoses   Final diagnoses:  [redacted] weeks gestation of pregnancy  Fall, initial encounter  Injury of left shoulder, initial encounter    ED Discharge Orders    None       Fredia Sorrow, MD 01/18/19 1245

## 2019-01-18 NOTE — ED Notes (Signed)
Per Erin at [facility], no monitoring needed at this time due to fall occurring yesterday.  Positive FHT and fetal movement seen on bedside ultrasound.

## 2019-01-18 NOTE — Discharge Instructions (Addendum)
Follow back up with [facility] to let them know about the fall.  Work-up today monitored through [facility].  No recommendations for any further evaluation.  Bedside ultrasound done by nursing showed evidence of good fetal heart tones and fetal movement.  X-ray of the left shoulder without any bony abnormalities related to the fall.  Recommend trying to hydrate yourself well the next few days.  Return for any new or worse symptoms.

## 2019-01-18 NOTE — Progress Notes (Signed)
1031 Received call regarding this 28 yo G4P3 @ 24.[redacted] wks GA in with report of trip and fall on left hip and shoulder yesturday. Reports decreased fetal movement.  Doppler FHT 140's per ED RN. Difficulty obtaining FHR tracing on external FM.  Fetal heart beat seen on bedside US. 26 Dr. Rip Harbour of [facility] notified of pt in ED and of above.  Sts Doppler FHT's are sufficient for OB clearance.  May discontinue external FM. 1043 [facility] ED staff notified pt is OB clear.

## 2019-01-18 NOTE — ED Triage Notes (Signed)
Pt states she fell yesterday on her left shoulder and hip.  Had no fetal movement yesterday, some at 6am, and then decreased today.

## 2019-01-21 ENCOUNTER — Telehealth: Payer: Self-pay | Admitting: Advanced Practice Midwife

## 2019-01-21 NOTE — Telephone Encounter (Signed)
Pt states that she found black mold in her apartment and is concerned about this since she is pregnant. Advised that black mold is not good for anyone, pregnant or not pregnant; however, there is no proven risk to a pregnancy from exposure to airborne mold. Pt verbalized understanding. Advised to call back with any other concerns.

## 2019-01-21 NOTE — Telephone Encounter (Signed)
Patient states that found Black mold in her apartment and they don't want to do anything about it. Pt would like to know if this could be a cause of her issues during this pregnancy. Please contact pt

## 2019-01-26 ENCOUNTER — Telehealth: Payer: Self-pay | Admitting: *Deleted

## 2019-01-26 NOTE — Telephone Encounter (Signed)
Patient states she went to the ER due to a fall and had a bedside u/s performed by a nurse to assess fetal heart tones and was told the fluid was low.  I informed the patient that since the nurse was not an ultrasonographer, a formal u/s would need to be done for that to be determined.  Advised to discuss with provider at her next visit.  Pt verbalized understanding with no further questions.

## 2019-01-28 ENCOUNTER — Ambulatory Visit (INDEPENDENT_AMBULATORY_CARE_PROVIDER_SITE_OTHER): Payer: Medicaid Other | Admitting: Advanced Practice Midwife

## 2019-01-28 ENCOUNTER — Other Ambulatory Visit: Payer: Medicaid Other

## 2019-01-28 ENCOUNTER — Other Ambulatory Visit (INDEPENDENT_AMBULATORY_CARE_PROVIDER_SITE_OTHER): Payer: Medicaid Other

## 2019-01-28 VITALS — BP 124/82 | HR 113 | Wt 143.0 lb

## 2019-01-28 DIAGNOSIS — O26843 Uterine size-date discrepancy, third trimester: Secondary | ICD-10-CM

## 2019-01-28 DIAGNOSIS — Z3A26 26 weeks gestation of pregnancy: Secondary | ICD-10-CM

## 2019-01-28 DIAGNOSIS — Z1389 Encounter for screening for other disorder: Secondary | ICD-10-CM

## 2019-01-28 DIAGNOSIS — Z3482 Encounter for supervision of other normal pregnancy, second trimester: Secondary | ICD-10-CM | POA: Diagnosis not present

## 2019-01-28 DIAGNOSIS — Z331 Pregnant state, incidental: Secondary | ICD-10-CM

## 2019-01-28 DIAGNOSIS — O26842 Uterine size-date discrepancy, second trimester: Secondary | ICD-10-CM

## 2019-01-28 DIAGNOSIS — Z131 Encounter for screening for diabetes mellitus: Secondary | ICD-10-CM

## 2019-01-28 DIAGNOSIS — Z3402 Encounter for supervision of normal first pregnancy, second trimester: Secondary | ICD-10-CM

## 2019-01-28 LAB — POCT URINALYSIS DIPSTICK OB
Blood, UA: NEGATIVE
GLUCOSE, UA: NEGATIVE
Nitrite, UA: NEGATIVE
POC,PROTEIN,UA: NEGATIVE

## 2019-01-28 NOTE — Patient Instructions (Signed)
Peggy [name], I greatly value your feedback.  If you receive a survey following your visit with [name] today, we appreciate you taking the time to fill it out.  Thanks, Nigel Berthold, CNM   Call the office [phone]) or go to [facility] if:  You begin to have strong, frequent contractions  Your water breaks.  Sometimes it is a big gush of fluid, sometimes it is just a trickle that keeps getting your panties wet or running down your legs  You have vaginal bleeding.  It is normal to have a small amount of spotting if your cervix was checked.   You don't feel your baby moving like normal.  If you don't, get you something to eat and drink and lay down and focus on feeling your baby move.  You should feel at least 10 movements in 2 hours.  If you don't, you should call the office or go to [facility].    Tdap Vaccine  It is recommended that you get the Tdap vaccine during the third trimester of EACH pregnancy to help protect your baby from getting pertussis (whooping cough)  27-36 weeks is the BEST time to do this so that you can pass the protection on to your baby. During pregnancy is better than after pregnancy, but if you are unable to get it during pregnancy it will be offered at the hospital.   You will be offered this vaccine in the office after 27 weeks. If you do not have health insurance, you can get this vaccine at the health department or your family doctor  Everyone who will be around your baby should also be up-to-date on their vaccines. Adults (who are not pregnant) only need 1 dose of Tdap during adulthood.   Third Trimester of Pregnancy The third trimester is from week 29 through week 42, months 7 through 9. The third trimester is a time when the fetus is growing rapidly. At the end of the ninth month, the fetus is about 20 inches in length and weighs 6-10 pounds.  BODY CHANGES Your body goes through many changes during pregnancy. The changes vary from  woman to woman.   Your weight will continue to increase. You can expect to gain 25-35 pounds (11-16 kg) by the end of the pregnancy.  You may begin to get stretch marks on your hips, abdomen, and breasts.  You may urinate more often because the fetus is moving lower into your pelvis and pressing on your bladder.  You may develop or continue to have heartburn as a result of your pregnancy.  You may develop constipation because certain hormones are causing the muscles that push waste through your intestines to slow down.  You may develop hemorrhoids or swollen, bulging veins (varicose veins).  You may have pelvic pain because of the weight gain and pregnancy hormones relaxing your joints between the bones in your pelvis. Backaches may result from overexertion of the muscles supporting your posture.  You may have changes in your hair. These can include thickening of your hair, rapid growth, and changes in texture. Some women also have hair loss during or after pregnancy, or hair that feels dry or thin. Your hair will most likely return to normal after your baby is born.  Your breasts will continue to grow and be tender. A yellow discharge may leak from your breasts called colostrum.  Your belly button may stick out.  You may feel short of breath because of your expanding uterus.  You may notice the fetus "dropping," or moving lower in your abdomen.  You may have a bloody mucus discharge. This usually occurs a few days to a week before labor begins.  Your cervix becomes thin and soft (effaced) near your due date. WHAT TO EXPECT AT YOUR PRENATAL EXAMS  You will have prenatal exams every 2 weeks until week 36. Then, you will have weekly prenatal exams. During a routine prenatal visit:  You will be weighed to make sure you and the fetus are growing normally.  Your blood pressure is taken.  Your abdomen will be measured to track your baby's growth.  The fetal heartbeat will be listened  to.  Any test results from the previous visit will be discussed.  You may have a cervical check near your due date to see if you have effaced. At around 36 weeks, your caregiver will check your cervix. At the same time, your caregiver will also perform a test on the secretions of the vaginal tissue. This test is to determine if a type of bacteria, Group B streptococcus, is present. Your caregiver will explain this further. Your caregiver may ask you:  What your birth plan is.  How you are feeling.  If you are feeling the baby move.  If you have had any abnormal symptoms, such as leaking fluid, bleeding, severe headaches, or abdominal cramping.  If you have any questions. Other tests or screenings that may be performed during your third trimester include:  Blood tests that check for low iron levels (anemia).  Fetal testing to check the health, activity level, and growth of the fetus. Testing is done if you have certain medical conditions or if there are problems during the pregnancy. FALSE LABOR You may feel small, irregular contractions that eventually go away. These are called Braxton Hicks contractions, or false labor. Contractions may last for hours, days, or even weeks before true labor sets in. If contractions come at regular intervals, intensify, or become painful, it is best to be seen by your caregiver.  SIGNS OF LABOR   Menstrual-like cramps.  Contractions that are 5 minutes apart or less.  Contractions that start on the top of the uterus and spread down to the lower abdomen and back.  A sense of increased pelvic pressure or back pain.  A watery or bloody mucus discharge that comes from the vagina. If you have any of these signs before the 37th week of pregnancy, call your caregiver right away. You need to go to the hospital to get checked immediately. HOME CARE INSTRUCTIONS   Avoid all smoking, herbs, alcohol, and unprescribed drugs. These chemicals affect the  formation and growth of the baby.  Follow your caregiver's instructions regarding medicine use. There are medicines that are either safe or unsafe to take during pregnancy.  Exercise only as directed by your caregiver. Experiencing uterine cramps is a good sign to stop exercising.  Continue to eat regular, healthy meals.  Wear a good support bra for breast tenderness.  Do not use hot tubs, steam rooms, or saunas.  Wear your seat belt at all times when driving.  Avoid raw meat, uncooked cheese, cat litter boxes, and soil used by cats. These carry germs that can cause birth defects in the baby.  Take your prenatal vitamins.  Try taking a stool softener (if your caregiver approves) if you develop constipation. Eat more high-fiber foods, such as fresh vegetables or fruit and whole grains. Drink plenty of fluids to keep your urine  clear or pale yellow.  Take warm sitz baths to soothe any pain or discomfort caused by hemorrhoids. Use hemorrhoid cream if your caregiver approves.  If you develop varicose veins, wear support hose. Elevate your feet for 15 minutes, 3-4 times a day. Limit salt in your diet.  Avoid heavy lifting, wear low heal shoes, and practice good posture.  Rest a lot with your legs elevated if you have leg cramps or low back pain.  Visit your dentist if you have not gone during your pregnancy. Use a soft toothbrush to brush your teeth and be gentle when you floss.  A sexual relationship may be continued unless your caregiver directs you otherwise.  Do not travel far distances unless it is absolutely necessary and only with the approval of your caregiver.  Take prenatal classes to understand, practice, and ask questions about the labor and delivery.  Make a trial run to the hospital.  Pack your hospital bag.  Prepare the baby's nursery.  Continue to go to all your prenatal visits as directed by your caregiver. SEEK MEDICAL CARE IF:  You are unsure if you are in  labor or if your water has broken.  You have dizziness.  You have mild pelvic cramps, pelvic pressure, or nagging pain in your abdominal area.  You have persistent nausea, vomiting, or diarrhea.  You have a bad smelling vaginal discharge.  You have pain with urination. SEEK IMMEDIATE MEDICAL CARE IF:   You have a fever.  You are leaking fluid from your vagina.  You have spotting or bleeding from your vagina.  You have severe abdominal cramping or pain.  You have rapid weight loss or gain.  You have shortness of breath with chest pain.  You notice sudden or extreme swelling of your face, hands, ankles, feet, or legs.  You have not felt your baby move in over an hour.  You have severe headaches that do not go away with medicine.  You have vision changes. Document Released: 12/10/2001 Document Revised: 12/21/2013 Document Reviewed: 02/16/2013 [facility] Patient Information 2015 [place], [place]. This information is not intended to replace advice given to you by your health care provider. Make sure you discuss any questions you have with your health care provider.

## 2019-01-28 NOTE — Progress Notes (Signed)
[name] 25+2 wks,cephalic,anterior placenta gr 3,cx 3 cm,afi 14 cm,normal ovaries bilat,fhr 150 bpm

## 2019-01-28 NOTE — Progress Notes (Signed)
  [id number] [redacted]w[redacted]d Estimated Date of Delivery: 05/04/19  Blood pressure 124/82, pulse (!) 113, weight 143 lb (64.9 kg), currently breastfeeding.   BP weight and urine results all reviewed and noted.  Please refer to the obstetrical flow sheet for the fundal height and fetal heart rate documentation:  Patient reports good fetal movement, denies any bleeding and no rupture of membranes symptoms or regular contractions. Patient fell 10 days ago, went to [facility].  Says nurse who did bedside US said "fluid looks a little low".     [name] 27+0 wks,cephalic,anterior placenta gr 3,cx 3 cm,afi 14 cm,normal ovaries bilat,fhr 150 bpm  Has 2-3 BH ctx a day, concerned.   All questions were answered.   Physical Assessment:   Vitals:   01/28/19 0929  BP: 124/82  Pulse: (!) 113  Weight: 143 lb (64.9 kg)  Body mass index is 27.02 kg/m.        Physical Examination:   General appearance: Well appearing, and in no distress  Mental status: Alert, oriented to person, place, and time  Skin: Warm & dry  Cardiovascular: Normal heart rate noted  Respiratory: Normal respiratory effort, no distress  Abdomen: Soft, gravid, nontender  Pelvic:  Dilation: Closed Effacement (%): Thick    Extremities: Edema: None  Fetal Status: Fetal Heart Rate (bpm): 142 Fundal Height: 24 cm Movement: Present    Results for orders placed or performed in visit on 01/28/19 (from the past 24 hour(s))  POC Urinalysis Dipstick OB   Collection Time: 01/28/19  9:36 AM  Result Value Ref Range   Color, UA     Clarity, UA     Glucose, UA Negative Negative   Bilirubin, UA     Ketones, UA small    Spec Grav, UA     Blood, UA neg    pH, UA     POC,PROTEIN,UA Negative Negative, Trace, Small (1+), Moderate (2+), Large (3+), 4+   Urobilinogen, UA     Nitrite, UA neg    Leukocytes, UA Small (1+) (A) Negative   Appearance     Odor       Orders Placed This Encounter  Procedures  . US OB Limited  . Fetal fibronectin  . POC Urinalysis  Dipstick OB    Plan:  Continued routine obstetrical care, PN2 today  Return in about 3 weeks (around 02/18/2019) for LROB.

## 2019-01-29 LAB — CBC
Hematocrit: 34.8 % (ref 34.0–46.6)
Hemoglobin: 11.8 g/dL (ref 11.1–15.9)
MCH: 30.3 pg (ref 26.6–33.0)
MCHC: 33.9 g/dL (ref 31.5–35.7)
MCV: 89 fL (ref 79–97)
PLATELETS: 328 10*3/uL (ref 150–450)
RBC: 3.9 x10E6/uL (ref 3.77–5.28)
RDW: 12.7 % (ref 11.7–15.4)
WBC: 15.9 10*3/uL — ABNORMAL HIGH (ref 3.4–10.8)

## 2019-01-29 LAB — HIV ANTIBODY (ROUTINE TESTING W REFLEX): HIV Screen 4th Generation wRfx: NONREACTIVE

## 2019-01-29 LAB — GLUCOSE TOLERANCE, 2 HOURS W/ 1HR
GLUCOSE, FASTING: 82 mg/dL (ref 65–91)
Glucose, 1 hour: 103 mg/dL (ref 65–179)
Glucose, 2 hour: 105 mg/dL (ref 65–152)

## 2019-01-29 LAB — FETAL FIBRONECTIN: FETAL FIBRONECTIN: NEGATIVE

## 2019-01-29 LAB — ANTIBODY SCREEN: Antibody Screen: NEGATIVE

## 2019-01-29 LAB — RPR: RPR: NONREACTIVE

## 2019-02-18 ENCOUNTER — Ambulatory Visit (INDEPENDENT_AMBULATORY_CARE_PROVIDER_SITE_OTHER): Payer: Medicaid Other | Admitting: Women's Health

## 2019-02-18 ENCOUNTER — Encounter: Payer: Self-pay | Admitting: Women's Health

## 2019-02-18 VITALS — BP 120/73 | HR 102 | Wt 148.0 lb

## 2019-02-18 DIAGNOSIS — Z3483 Encounter for supervision of other normal pregnancy, third trimester: Secondary | ICD-10-CM

## 2019-02-18 DIAGNOSIS — Z3A29 29 weeks gestation of pregnancy: Secondary | ICD-10-CM

## 2019-02-18 DIAGNOSIS — Z331 Pregnant state, incidental: Secondary | ICD-10-CM

## 2019-02-18 DIAGNOSIS — Z23 Encounter for immunization: Secondary | ICD-10-CM

## 2019-02-18 DIAGNOSIS — O3663X0 Maternal care for excessive fetal growth, third trimester, not applicable or unspecified: Secondary | ICD-10-CM

## 2019-02-18 DIAGNOSIS — Z1389 Encounter for screening for other disorder: Secondary | ICD-10-CM

## 2019-02-18 LAB — POCT URINALYSIS DIPSTICK OB
Blood, UA: NEGATIVE
Glucose, UA: NEGATIVE
KETONES UA: NEGATIVE
Nitrite, UA: NEGATIVE
POC,PROTEIN,UA: NEGATIVE

## 2019-02-18 NOTE — Patient Instructions (Signed)
Peggy [name], I greatly value your feedback.  If you receive a survey following your visit with [name] today, we appreciate you taking the time to fill it out.  Thanks, Knute Neu, CNM, [facility]  [place]!!! to [facility] ([place], [place] [address]) on Sunday February 21, 2019 at 5:30am It will be called the [place], and it is located off of [address] (the current [facility]) on February 23rd or after, no one will be there!     Call the office [phone]) or go to [facility] if:  You begin to have strong, frequent contractions  Your water breaks.  Sometimes it is a big gush of fluid, sometimes it is just a trickle that keeps getting your panties wet or running down your legs  You have vaginal bleeding.  It is normal to have a small amount of spotting if your cervix was checked.   You don't feel your baby moving like normal.  If you don't, get you something to eat and drink and lay down and focus on feeling your baby move.  You should feel at least 10 movements in 2 hours.  If you don't, you should call the office or go to [facility].    Tdap Vaccine  It is recommended that you get the Tdap vaccine during the third trimester of EACH pregnancy to help protect your baby from getting pertussis (whooping cough)  27-36 weeks is the BEST time to do this so that you can pass the protection on to your baby. During pregnancy is better than after pregnancy, but if you are unable to get it during pregnancy it will be offered at the hospital.   You can get this vaccine with [name], at the health department, your family doctor, or some local pharmacies  Everyone who will be around your baby should also be up-to-date on their vaccines before the baby comes. Adults (who are not pregnant) only need 1 dose of Tdap during adulthood.   Third Trimester of Pregnancy The third trimester is from  week 29 through week 42, months 7 through 9. The third trimester is a time when the fetus is growing rapidly. At the end of the ninth month, the fetus is about 20 inches in length and weighs 6-10 pounds.  BODY CHANGES Your body goes through many changes during pregnancy. The changes vary from woman to woman.   Your weight will continue to increase. You can expect to gain 25-35 pounds (11-16 kg) by the end of the pregnancy.  You may begin to get stretch marks on your hips, abdomen, and breasts.  You may urinate more often because the fetus is moving lower into your pelvis and pressing on your bladder.  You may develop or continue to have heartburn as a result of your pregnancy.  You may develop constipation because certain hormones are causing the muscles that push waste through your intestines to slow down.  You may develop hemorrhoids or swollen, bulging veins (varicose veins).  You may have pelvic pain because of the weight gain and pregnancy hormones relaxing your joints between the bones in your pelvis. Backaches may result from overexertion of the muscles supporting your posture.  You may have changes in your hair. These can include thickening of your hair, rapid growth, and changes in texture. Some women also have hair loss during or after pregnancy, or hair that feels  dry or thin. Your hair will most likely return to normal after your baby is born.  Your breasts will continue to grow and be tender. A yellow discharge may leak from your breasts called colostrum.  Your belly button may stick out.  You may feel short of breath because of your expanding uterus.  You may notice the fetus "dropping," or moving lower in your abdomen.  You may have a bloody mucus discharge. This usually occurs a few days to a week before labor begins.  Your cervix becomes thin and soft (effaced) near your due date. WHAT TO EXPECT AT YOUR PRENATAL EXAMS  You will have prenatal exams every 2 weeks until  week 36. Then, you will have weekly prenatal exams. During a routine prenatal visit:  You will be weighed to make sure you and the fetus are growing normally.  Your blood pressure is taken.  Your abdomen will be measured to track your baby's growth.  The fetal heartbeat will be listened to.  Any test results from the previous visit will be discussed.  You may have a cervical check near your due date to see if you have effaced. At around 36 weeks, your caregiver will check your cervix. At the same time, your caregiver will also perform a test on the secretions of the vaginal tissue. This test is to determine if a type of bacteria, Group B streptococcus, is present. Your caregiver will explain this further. Your caregiver may ask you:  What your birth plan is.  How you are feeling.  If you are feeling the baby move.  If you have had any abnormal symptoms, such as leaking fluid, bleeding, severe headaches, or abdominal cramping.  If you have any questions. Other tests or screenings that may be performed during your third trimester include:  Blood tests that check for low iron levels (anemia).  Fetal testing to check the health, activity level, and growth of the fetus. Testing is done if you have certain medical conditions or if there are problems during the pregnancy. FALSE LABOR You may feel small, irregular contractions that eventually go away. These are called Braxton Hicks contractions, or false labor. Contractions may last for hours, days, or even weeks before true labor sets in. If contractions come at regular intervals, intensify, or become painful, it is best to be seen by your caregiver.  SIGNS OF LABOR   Menstrual-like cramps.  Contractions that are 5 minutes apart or less.  Contractions that start on the top of the uterus and spread down to the lower abdomen and back.  A sense of increased pelvic pressure or back pain.  A watery or bloody mucus discharge that comes  from the vagina. If you have any of these signs before the 37th week of pregnancy, call your caregiver right away. You need to go to the hospital to get checked immediately. HOME CARE INSTRUCTIONS   Avoid all smoking, herbs, alcohol, and unprescribed drugs. These chemicals affect the formation and growth of the baby.  Follow your caregiver's instructions regarding medicine use. There are medicines that are either safe or unsafe to take during pregnancy.  Exercise only as directed by your caregiver. Experiencing uterine cramps is a good sign to stop exercising.  Continue to eat regular, healthy meals.  Wear a good support bra for breast tenderness.  Do not use hot tubs, steam rooms, or saunas.  Wear your seat belt at all times when driving.  Avoid raw meat, uncooked cheese, cat litter boxes,  and soil used by cats. These carry germs that can cause birth defects in the baby.  Take your prenatal vitamins.  Try taking a stool softener (if your caregiver approves) if you develop constipation. Eat more high-fiber foods, such as fresh vegetables or fruit and whole grains. Drink plenty of fluids to keep your urine clear or pale yellow.  Take warm sitz baths to soothe any pain or discomfort caused by hemorrhoids. Use hemorrhoid cream if your caregiver approves.  If you develop varicose veins, wear support hose. Elevate your feet for 15 minutes, 3-4 times a day. Limit salt in your diet.  Avoid heavy lifting, wear low heal shoes, and practice good posture.  Rest a lot with your legs elevated if you have leg cramps or low back pain.  Visit your dentist if you have not gone during your pregnancy. Use a soft toothbrush to brush your teeth and be gentle when you floss.  A sexual relationship may be continued unless your caregiver directs you otherwise.  Do not travel far distances unless it is absolutely necessary and only with the approval of your caregiver.  Take prenatal classes to  understand, practice, and ask questions about the labor and delivery.  Make a trial run to the hospital.  Pack your hospital bag.  Prepare the baby's nursery.  Continue to go to all your prenatal visits as directed by your caregiver. SEEK MEDICAL CARE IF:  You are unsure if you are in labor or if your water has broken.  You have dizziness.  You have mild pelvic cramps, pelvic pressure, or nagging pain in your abdominal area.  You have persistent nausea, vomiting, or diarrhea.  You have a bad smelling vaginal discharge.  You have pain with urination. SEEK IMMEDIATE MEDICAL CARE IF:   You have a fever.  You are leaking fluid from your vagina.  You have spotting or bleeding from your vagina.  You have severe abdominal cramping or pain.  You have rapid weight loss or gain.  You have shortness of breath with chest pain.  You notice sudden or extreme swelling of your face, hands, ankles, feet, or legs.  You have not felt your baby move in over an hour.  You have severe headaches that do not go away with medicine.  You have vision changes. Document Released: 12/10/2001 Document Revised: 12/21/2013 Document Reviewed: 02/16/2013 [facility] Patient Information 2015 [place], [place]. This information is not intended to replace advice given to you by your health care provider. Make sure you discuss any questions you have with your health care provider.

## 2019-02-18 NOTE — Progress Notes (Signed)
   LOW-RISK PREGNANCY VISIT Patient name: Peggy [name] MRN [id number]  Date of birth: 06/08/1991 Chief Complaint:   Routine Prenatal Visit  History of Present Illness:   Peggy [name] is a 28 y.o. [phone] female at [redacted]w[redacted]d with an Estimated Date of Delivery: 05/04/19 being seen today for ongoing management of a low-risk pregnancy.  Today she reports no complaints. Contractions: Irregular. Vag. Bleeding: None.  Movement: Present. denies leaking of fluid. Review of Systems:   Pertinent items are noted in HPI Denies abnormal vaginal discharge w/ itching/odor/irritation, headaches, visual changes, shortness of breath, chest pain, abdominal pain, severe nausea/vomiting, or problems with urination or bowel movements unless otherwise stated above. Pertinent History Reviewed:  Reviewed past medical,surgical, social, obstetrical and family history.  Reviewed problem list, medications and allergies. Physical Assessment:   Vitals:   02/18/19 0906  BP: 120/73  Pulse: (!) 102  Weight: 148 lb (67.1 kg)  Body mass index is 27.96 kg/m.        Physical Examination:   General appearance: Well appearing, and in no distress  Mental status: Alert, oriented to person, place, and time  Skin: Warm & dry  Cardiovascular: Normal heart rate noted  Respiratory: Normal respiratory effort, no distress  Abdomen: Soft, gravid, nontender  Pelvic: Cervical exam deferred         Extremities: Edema: Trace  Fetal Status: Fetal Heart Rate (bpm): 130 Fundal Height: 30 cm Movement: Present    Results for orders placed or performed in visit on 02/18/19 (from the past 24 hour(s))  POC Urinalysis Dipstick OB   Collection Time: 02/18/19  9:07 AM  Result Value Ref Range   Color, UA     Clarity, UA     Glucose, UA Negative Negative   Bilirubin, UA     Ketones, UA neg    Spec Grav, UA     Blood, UA neg    pH, UA     POC,PROTEIN,UA Negative Negative, Trace, Small (1+), Moderate (2+), Large (3+), 4+   Urobilinogen, UA     Nitrite, UA neg    Leukocytes, UA Trace (A) Negative   Appearance     Odor      Assessment & Plan:  1) Low-risk pregnancy [id number] at [redacted]w[redacted]d with an Estimated Date of Delivery: 05/04/19   2) Suspected LGA, 93% at 18wks, will get EFW next visit  3) H/O GDM> normal 2hr GTT this pregnancy   Meds: No orders of the defined types were placed in this encounter.  Labs/procedures today: tdap  Plan:  Continue routine obstetrical care   Reviewed: Preterm labor symptoms and general obstetric precautions including but not limited to vaginal bleeding, contractions, leaking of fluid and fetal movement were reviewed in detail with the patient.  All questions were answered  Follow-up: Return in about 3 weeks (around 03/11/2019) for LROB, US:EFW.  Orders Placed This Encounter  Procedures  . US OB Follow Up  . Tdap vaccine greater than or equal to 7yo IM  . POC Urinalysis Dipstick OB   Roma Schanz CNM, [facility] 02/18/2019 9:30 AM

## 2019-02-21 IMAGING — DX DG CHEST 1V
1 series · 1 of 1 positions shown · non-contrast
Comparison: 04/04/2018

CLINICAL DATA: Cough, wheezing

EXAM:
CHEST  1 VIEW

[chest pa]
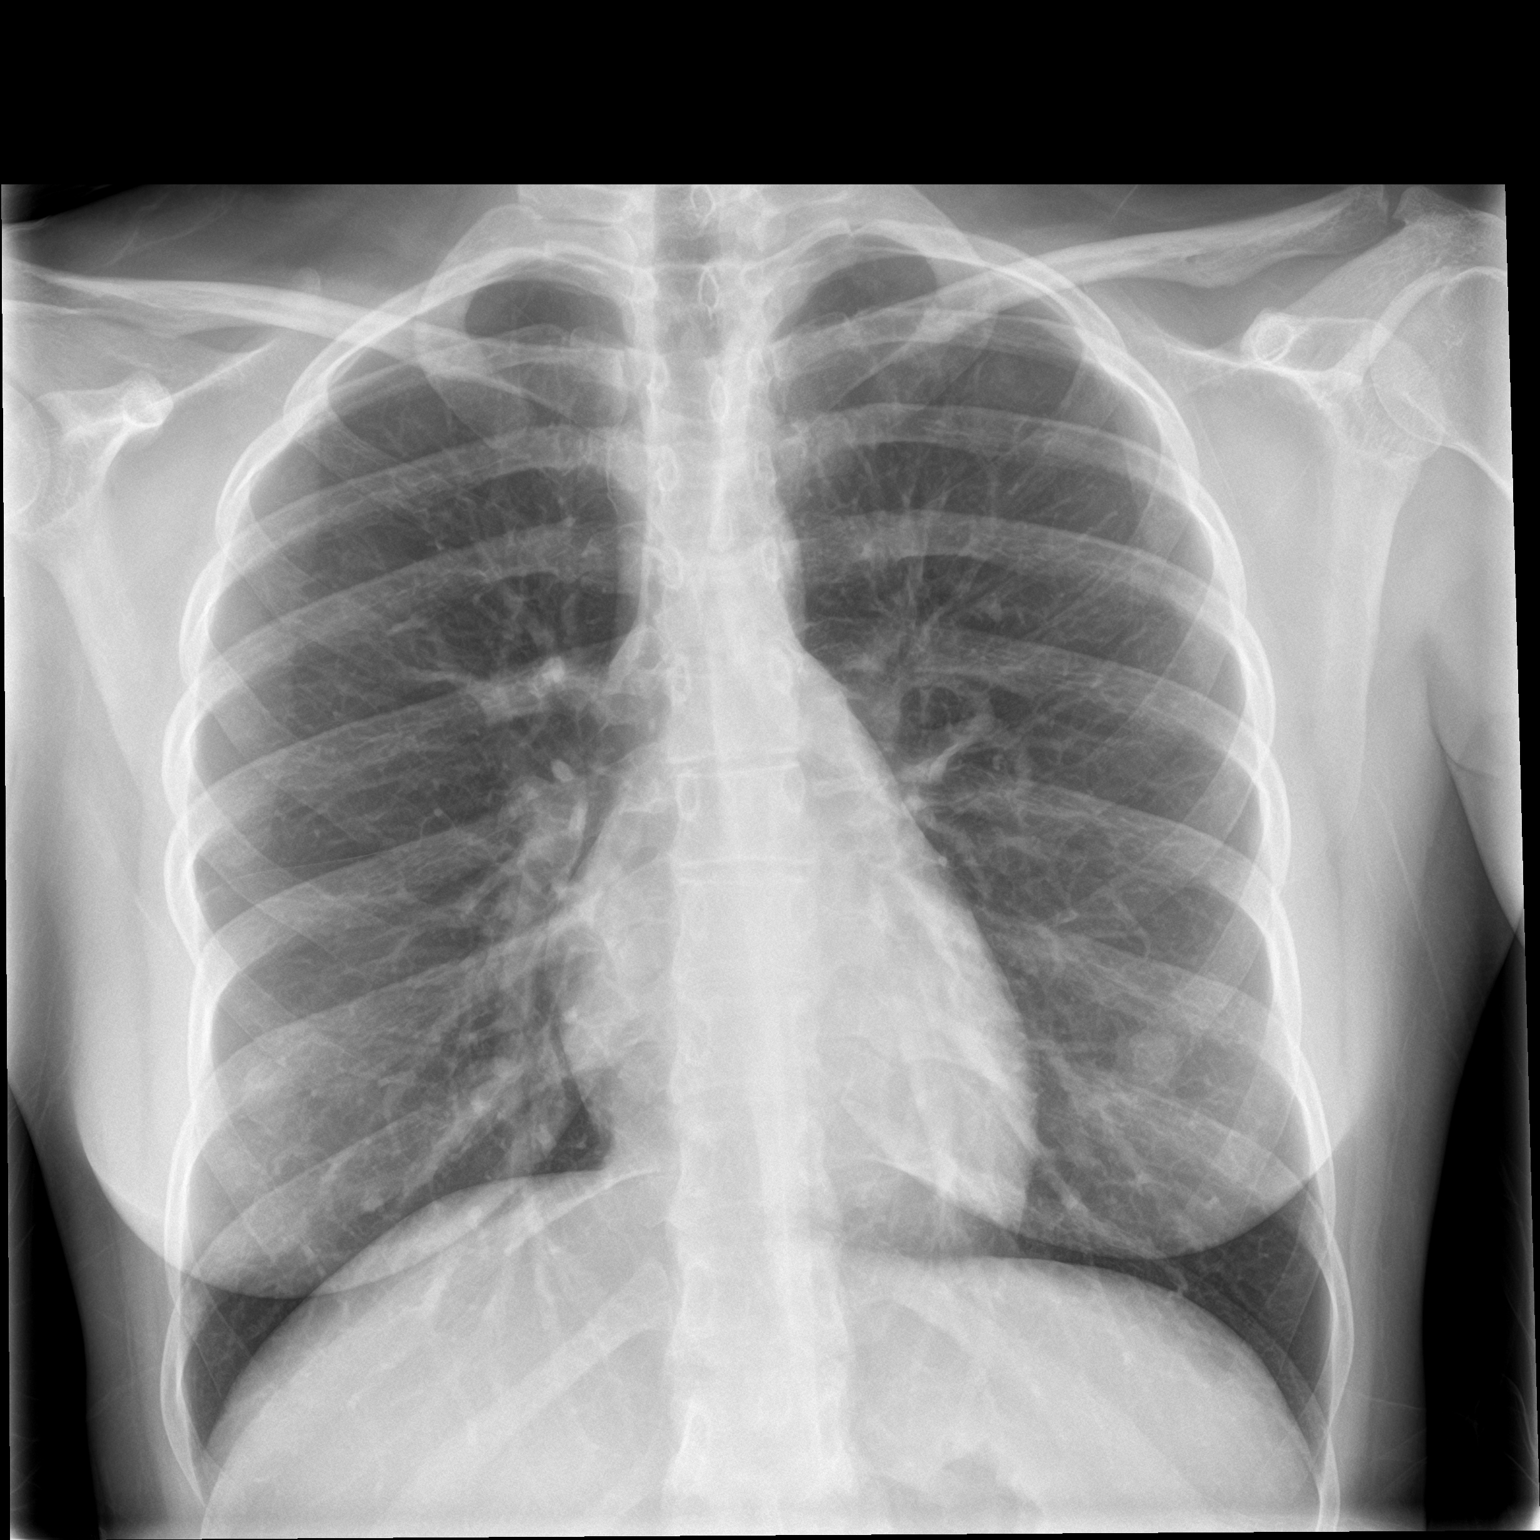

[1 of 1 positions shown; findings below may reference images not displayed]

FINDINGS: Heart and mediastinal contours are within normal limits. No focal
opacities or effusions. No acute bony abnormality.
IMPRESSION: No active disease.

## 2019-02-22 ENCOUNTER — Other Ambulatory Visit: Payer: Self-pay | Admitting: Women's Health

## 2019-02-22 MED ORDER — PANTOPRAZOLE SODIUM 20 MG PO TBEC: 20.0000 mg | DELAYED_RELEASE_TABLET | Freq: Every day | ORAL | 6 refills | Status: DC

## 2019-02-23 ENCOUNTER — Encounter: Payer: Self-pay | Admitting: Obstetrics and Gynecology

## 2019-02-23 ENCOUNTER — Telehealth: Payer: Self-pay | Admitting: Advanced Practice Midwife

## 2019-02-23 ENCOUNTER — Ambulatory Visit (INDEPENDENT_AMBULATORY_CARE_PROVIDER_SITE_OTHER): Payer: Medicaid Other | Admitting: Obstetrics and Gynecology

## 2019-02-23 ENCOUNTER — Ambulatory Visit (HOSPITAL_COMMUNITY)
Admission: RE | Admit: 2019-02-23 | Discharge: 2019-02-23 | Disposition: A | Discharge status: Home / Self Care | Payer: Medicaid Other | Source: Ambulatory Visit | Attending: Obstetrics and Gynecology | Admitting: Obstetrics and Gynecology

## 2019-02-23 VITALS — BP 129/80 | HR 104 | Temp 98.9°F | Wt 148.6 lb

## 2019-02-23 DIAGNOSIS — R0781 Pleurodynia: Secondary | ICD-10-CM | POA: Insufficient documentation

## 2019-02-23 DIAGNOSIS — Z1389 Encounter for screening for other disorder: Secondary | ICD-10-CM

## 2019-02-23 DIAGNOSIS — Z331 Pregnant state, incidental: Secondary | ICD-10-CM

## 2019-02-23 DIAGNOSIS — R079 Chest pain, unspecified: Secondary | ICD-10-CM | POA: Diagnosis not present

## 2019-02-23 LAB — POCT URINALYSIS DIPSTICK OB
Blood, UA: NEGATIVE
Glucose, UA: NEGATIVE
Ketones, UA: NEGATIVE
Nitrite, UA: NEGATIVE
POC,PROTEIN,UA: NEGATIVE

## 2019-02-23 MED ORDER — HYDROCODONE-HOMATROPINE 5-1.5 MG/5ML PO SYRP: 5.0000 mL | ORAL_SOLUTION | Freq: Four times a day (QID) | ORAL | 0 refills | Status: DC | PRN

## 2019-02-23 NOTE — Telephone Encounter (Signed)
Patient called stated she is 30 weeks today, having pain up in her ribs, shortness of breath and also has cold symptoms.  She wanted to be seen today.  Please advise.  [phone]

## 2019-02-23 NOTE — Telephone Encounter (Signed)
Patient states she is having some shortness of breath and her ribs are hurting as well.  She has used her inhaler with some relief as she is not wheezing as bad.  She does have a cough in which she has been coughing up green mucous since Friday. Does not have a fever.  Informed her ribs may be sore from coughing but since she has the green mucous, we can work her in for eval.  Pt verbalized understanding but needs the latest appt we have.  Will w/i @ 4pm with Dr Glo Herring.

## 2019-02-23 NOTE — Progress Notes (Signed)
Patient ID: Alvia Grove, female   DOB: 10-23-1991, 28 y.o.   MRN: [id number]    LOW-RISK PREGNANCY VISIT Patient name: Peggy [name] MRN [id number]  Date of birth: November 16, 1991 Chief Complaint:   work-in-ob (short of breath)  History of Present Illness:   Peggy [name] is a 28 y.o. [phone] female at [redacted]w[redacted]d with an Estimated Date of Delivery: 05/04/19 being seen today for ongoing management of a W/I low-risk pregnancy. Started out as runny nose and headache Friday and progressed with a cough over the weekend. Denies wheezing. Woke up today SOB with pain in ribs on right side and into right arm, right shoulder and back. Has been feeling dizzy and "out of it all day". Has had pneumonia eery single pregnancy.  Today she reports green mucous and SOB.  .  .   . denies leaking of fluid. Review of Systems:   Pertinent items are noted in HPI Denies abnormal vaginal discharge w/ itching/odor/irritation, visual changes, abdominal pain, severe nausea/vomiting, or problems with urination or bowel movements unless otherwise stated above. Pertinent History Reviewed:  Reviewed past medical,surgical, social, obstetrical and family history.  Reviewed problem list, medications and allergies. Physical Assessment:  There were no vitals filed for this visit.There is no height or weight on file to calculate BMI.        Physical Examination:   General appearance: Well appearing, and in no distress  Mental status: Alert, oriented to person, place, and time  Skin: Warm & dry  Cardiovascular: Normal heart rate noted  Respiratory: Normal respiratory effort, lungs clear to ascultation, no wheezing  Abdomen: Soft, gravid, nontender  Pelvic: Cervical exam deferred         Extremities:    Fetal Status:          No results found for this or any previous visit (from the past 24 hour(s)).  Assessment & Plan:  1) Low-risk pregnancy G4P3003 at [redacted]w[redacted]d with an Estimated Date of Delivery: 05/04/19   2) URI  R/o  spont pneumo (low likelihood)   Meds: No orders of the defined types were placed in this encounter.  Labs/procedures today: None  Plan:  Chest x-ray, shield abdomen Check WBC,CBC   Follow-up: No follow-ups on file.  No orders of the defined types were placed in this encounter.  By signing my name below, I, Samul Dada, attest that this documentation has been prepared under the direction and in the presence of Jonnie Kind, MD. Electronically Signed: [place]. 02/23/19. 4:17 PM.  I personally performed the services described in this documentation, which was SCRIBED in my presence. The recorded information has been reviewed and considered accurate. It has been edited as necessary during review. Jonnie Kind, MD

## 2019-02-23 NOTE — Addendum Note (Signed)
Addended by: Jonnie Kind on: 02/23/2019 04:51 PM   Modules accepted: Orders

## 2019-02-24 ENCOUNTER — Telehealth: Payer: Self-pay | Admitting: *Deleted

## 2019-02-24 ENCOUNTER — Encounter: Payer: Self-pay | Admitting: *Deleted

## 2019-02-24 ENCOUNTER — Encounter: Payer: Self-pay | Admitting: Obstetrics and Gynecology

## 2019-02-24 ENCOUNTER — Other Ambulatory Visit: Payer: Self-pay | Admitting: Obstetrics and Gynecology

## 2019-02-24 MED ORDER — HYDROCODONE-HOMATROPINE 5-1.5 MG/5ML PO SYRP: 5.0000 mL | ORAL_SOLUTION | Freq: Four times a day (QID) | ORAL | 0 refills | Status: DC | PRN

## 2019-02-24 NOTE — Telephone Encounter (Signed)
Patient may take Robitussin-DM for the cough which is an over-the-counter medication

## 2019-02-24 NOTE — Progress Notes (Signed)
Hycodan rx sent to [facility] on [address].

## 2019-02-24 NOTE — Telephone Encounter (Signed)
Patient states she wants to know the results of her chest xray.  Informed I did not see any results yet but will send message to Dr Glo Herring. Also states [facility] does not have Hycodan in stock.  Spoke with [facility] on [address] and they do have it if script could be sent there.

## 2019-03-07 ENCOUNTER — Encounter (HOSPITAL_COMMUNITY): Payer: Self-pay

## 2019-03-07 ENCOUNTER — Inpatient Hospital Stay (HOSPITAL_COMMUNITY)
Admission: AD | Admit: 2019-03-07 | Discharge: 2019-03-07 | Disposition: A | Discharge status: Home / Self Care | Payer: Medicaid Other | Attending: Family Medicine | Admitting: Family Medicine

## 2019-03-07 ENCOUNTER — Other Ambulatory Visit: Payer: Self-pay

## 2019-03-07 DIAGNOSIS — O23599 Infection of other part of genital tract in pregnancy, unspecified trimester: Secondary | ICD-10-CM

## 2019-03-07 DIAGNOSIS — J45909 Unspecified asthma, uncomplicated: Secondary | ICD-10-CM | POA: Insufficient documentation

## 2019-03-07 DIAGNOSIS — Z3A31 31 weeks gestation of pregnancy: Secondary | ICD-10-CM | POA: Diagnosis not present

## 2019-03-07 DIAGNOSIS — R1030 Lower abdominal pain, unspecified: Secondary | ICD-10-CM | POA: Insufficient documentation

## 2019-03-07 DIAGNOSIS — F17210 Nicotine dependence, cigarettes, uncomplicated: Secondary | ICD-10-CM | POA: Diagnosis not present

## 2019-03-07 DIAGNOSIS — B9689 Other specified bacterial agents as the cause of diseases classified elsewhere: Secondary | ICD-10-CM | POA: Diagnosis not present

## 2019-03-07 DIAGNOSIS — O26893 Other specified pregnancy related conditions, third trimester: Secondary | ICD-10-CM | POA: Insufficient documentation

## 2019-03-07 DIAGNOSIS — O23593 Infection of other part of genital tract in pregnancy, third trimester: Secondary | ICD-10-CM | POA: Diagnosis not present

## 2019-03-07 LAB — URINALYSIS, ROUTINE W REFLEX MICROSCOPIC
Bilirubin Urine: NEGATIVE
Glucose, UA: NEGATIVE mg/dL
Hgb urine dipstick: NEGATIVE
Ketones, ur: 5 mg/dL — AB
Leukocytes,Ua: NEGATIVE
Nitrite: NEGATIVE
Protein, ur: NEGATIVE mg/dL
Specific Gravity, Urine: 1.032 — ABNORMAL HIGH (ref 1.005–1.030)
pH: 5 (ref 5.0–8.0)

## 2019-03-07 LAB — WET PREP, GENITAL
Sperm: NONE SEEN
TRICH WET PREP: NONE SEEN
Yeast Wet Prep HPF POC: NONE SEEN

## 2019-03-07 MED ORDER — METRONIDAZOLE 0.75 % VA GEL: 1.0000 | Freq: Every day | VAGINAL | 0 refills | Status: DC

## 2019-03-07 MED ORDER — ACETAMINOPHEN 500 MG PO TABS
1000.0000 mg | ORAL_TABLET | Freq: Once | ORAL | Status: AC
  Administered 2019-03-07: 1000 mg via ORAL
  Filled 2019-03-07: qty 2

## 2019-03-07 NOTE — MAU Note (Addendum)
Presents with lower, abd cramping 10/10; starting around 1700 yesterday. No vag bldg or LOF. Pos FM. C/o occ dull HA 3/10, dizziness, blurred vision for approx 3 wks. Denies RUQ pain now; however had RUQ stabbing pain yesterday around 2100. Pt states,"provider aware at [facility] and are monitoring BP's".  Gilmer Mor RN

## 2019-03-07 NOTE — MAU Provider Note (Signed)
History     CSN: [id number]  Arrival date and time: 03/07/19 0938   First Provider Initiated Contact with Patient 03/07/19 0140      Chief Complaint  Patient presents with  . Abdominal Cramping   Peggy [name] is a 29 y.o. [phone] at [redacted]w[redacted]d who presents for Abdominal Cramping.  She states her cramping started yesterday and has been intermittent in nature.  She reports that she has not taken anything for the cramping and notices it more when she is sleeping.  She states the pain tonight was so bad that it woke her from her sleep.  She reports that the pain is in the lower abdominal area and radiates down her hips.  She endorses fetal movement and denies vaginal concerns including bleeding, discharge, and leaking.  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 She reports daily water intake of "about 6."      OB History    Gravida  4   Para  3   Term  3   Preterm  0   AB  0   Living  3     SAB  0   TAB  0   Ectopic  0   Multiple  0   Live Births  3           Past Medical History:  Diagnosis Date  . Asthma   . Gestational diabetes   . Pyloric stenosis     Past Surgical History:  Procedure Laterality Date  . PYLOROMYOTOMY      Family History  Problem Relation Age of Onset  . Arthritis Mother   . Cancer Mother        thyroid  . Depression Mother   . Hyperlipidemia Father   . Asthma Father   . Hypertension Father   . Heart disease Father   . COPD Maternal Grandfather   . Diabetes Paternal Grandmother   . Arthritis Paternal Grandmother   . Asthma Paternal Grandmother   . Heart disease Paternal Grandmother   . Depression Paternal Grandmother   . Hypertension Paternal Grandmother   . Hyperlipidemia Paternal Grandmother   . Other Neg Hx     Social History   Tobacco Use  . Smoking status: Current Every Day Smoker    Packs/day: 0.25    Years: 5.00    Pack years: 1.25    Types:  Cigarettes  . Smokeless tobacco: Never Used  . Tobacco comment: 2 cig/day  Substance Use Topics  . Alcohol use: No  . Drug use: No    Allergies: No Known Allergies  Medications Prior to Admission  Medication Sig Dispense Refill Last Dose  . acetaminophen (TYLENOL) 325 MG tablet Take 650 mg by mouth every 6 (six) hours as needed for mild pain or headache.   Taking  . albuterol (PROVENTIL HFA;VENTOLIN HFA) 108 (90 Base) MCG/ACT inhaler Inhale 2 puffs into the lungs every 6 (six) hours as needed for wheezing or shortness of breath. 1 Inhaler 2 Taking  . albuterol (PROVENTIL) (2.5 MG/3ML) 0.083% nebulizer solution Take 3 mLs (2.5 mg total) by nebulization every 4 (four) hours as needed for wheezing or shortness of breath. (Patient not taking: Reported on 02/23/2019) 75 mL 12 Not Taking  . Doxylamine-Pyridoxine (DICLEGIS) 10-10 MG TBEC Take 2 qhs; may also take one in am and one in afternoon prn nausea 120 tablet 6 Taking  . HYDROcodone-homatropine (HYCODAN) 5-1.5 MG/5ML syrup Take  5 mLs by mouth every 6 (six) hours as needed for cough. 120 mL 0   . pantoprazole (PROTONIX) 20 MG tablet Take 1 tablet (20 mg total) by mouth daily. 30 tablet 6 Taking  . promethazine (PHENERGAN) 12.5 MG tablet Take 1 tablet (12.5 mg total) by mouth every 6 (six) hours as needed for nausea or vomiting. 30 tablet 0 Taking    Review of Systems  Gastrointestinal: Positive for abdominal pain (Cramping). Negative for constipation and diarrhea.  Genitourinary: Negative for dysuria, vaginal bleeding and vaginal discharge.  Musculoskeletal: Positive for back pain.   Physical Exam   Blood pressure 121/68, pulse 99, temperature 99 F (37.2 C), temperature source Oral, resp. rate 16, height 5' (1.524 m), weight 67.3 kg, SpO2 98 %, currently breastfeeding.  Physical Exam  Constitutional: She is oriented to person, place, and time. She appears well-developed and well-nourished. No distress.  HENT:  Head: Normocephalic and  atraumatic.  Eyes: Conjunctivae are normal.  Neck: Normal range of motion.  Cardiovascular: Normal rate, regular rhythm and normal heart sounds.  Respiratory: Effort normal and breath sounds normal.  GI: Soft. Bowel sounds are normal.  Musculoskeletal: Normal range of motion.  Neurological: She is alert and oriented to person, place, and time.  Skin: Skin is warm and dry.  Psychiatric: She has a normal mood and affect. Her behavior is normal.    Fetal Assessment 140 bpm, Mod Var, -Decels, +Accels Toco: None  MAU Course   Results for orders placed or performed during the hospital encounter of 03/07/19 (from the past 24 hour(s))  Wet prep, genital     Status: Abnormal   Collection Time: 03/07/19  1:48 AM  Result Value Ref Range   Yeast Wet Prep HPF POC NONE SEEN NONE SEEN   Trich, Wet Prep NONE SEEN NONE SEEN   Clue Cells Wet Prep HPF POC PRESENT (A) NONE SEEN   WBC, Wet Prep HPF POC MANY (A) NONE SEEN   Sperm NONE SEEN   Urinalysis, Routine w reflex microscopic     Status: Abnormal   Collection Time: 03/07/19  3:16 AM  Result Value Ref Range   Color, Urine YELLOW YELLOW   APPearance HAZY (A) CLEAR   Specific Gravity, Urine 1.032 (H) 1.005 - 1.030   pH 5.0 5.0 - 8.0   Glucose, UA NEGATIVE NEGATIVE mg/dL   Hgb urine dipstick NEGATIVE NEGATIVE   Bilirubin Urine NEGATIVE NEGATIVE   Ketones, ur 5 (A) NEGATIVE mg/dL   Protein, ur NEGATIVE NEGATIVE mg/dL   Nitrite NEGATIVE NEGATIVE   Leukocytes,Ua NEGATIVE NEGATIVE   No results found.  MDM PE Labs: UA, Wet Prep, GC/CT Pain Medication EFM  Assessment and Plan  28 years old G4P3003 at 31.5 weeks Cat I FT Abdominal Pain   -Exam findings discussed -Reactive NST -Wet Prep pending -GC/CT collected and sent -Tylenol 1 gram now -Will await results   Follow Up (3:37 AM) Bacterial Vaginosis  -Wet prep returns significant for clue cells -Results discussed with patient and educated on treatment options. -Rx for  Metrogel 0.75% PV QHS x 5days sent to pharmacy on file.  -Discussed increased hydration throughout the day. -Labor Precautions given -No questions or concerns -Encouraged to call or return to MAU if symptoms worsen or with the onset of new symptoms. -Discharged to home in stable condition  Maryann Conners MSN, CNM 03/07/2019, 1:40 AM

## 2019-03-07 NOTE — Discharge Instructions (Signed)
Bacterial Vaginosis    Bacterial vaginosis is a vaginal infection that occurs when the normal balance of bacteria in the vagina is disrupted. It results from an overgrowth of certain bacteria. This is the most common vaginal infection among women ages 15-44.  Because bacterial vaginosis increases your risk for STIs (sexually transmitted infections), getting treated can help reduce your risk for chlamydia, gonorrhea, herpes, and HIV (human immunodeficiency virus). Treatment is also important for preventing complications in pregnant women, because this condition can cause an early (premature) delivery.  What are the causes?  This condition is caused by an increase in harmful bacteria that are normally present in small amounts in the vagina. However, the reason that the condition develops is not fully understood.  What increases the risk?  The following factors may make you more likely to develop this condition:  · Having a new sexual partner or multiple sexual partners.  · Having unprotected sex.  · Douching.  · Having an intrauterine device (IUD).  · Smoking.  · Drug and alcohol abuse.  · Taking certain antibiotic medicines.  · Being pregnant.  You cannot get bacterial vaginosis from toilet seats, bedding, swimming pools, or contact with objects around you.  What are the signs or symptoms?  Symptoms of this condition include:  · Grey or white vaginal discharge. The discharge can also be watery or foamy.  · A fish-like odor with discharge, especially after sexual intercourse or during menstruation.  · Itching in and around the vagina.  · Burning or pain with urination.  Some women with bacterial vaginosis have no signs or symptoms.  How is this diagnosed?  This condition is diagnosed based on:  · Your medical history.  · A physical exam of the vagina.  · Testing a sample of vaginal fluid under a microscope to look for a large amount of bad bacteria or abnormal cells. Your health care provider may use a cotton swab or  a small wooden spatula to collect the sample.  How is this treated?  This condition is treated with antibiotics. These may be given as a pill, a vaginal cream, or a medicine that is put into the vagina (suppository). If the condition comes back after treatment, a second round of antibiotics may be needed.  Follow these instructions at home:  Medicines  · Take over-the-counter and prescription medicines only as told by your health care provider.  · Take or use your antibiotic as told by your health care provider. Do not stop taking or using the antibiotic even if you start to feel better.  General instructions  · If you have a female sexual partner, tell her that you have a vaginal infection. She should see her health care provider and be treated if she has symptoms. If you have a female sexual partner, he does not need treatment.  · During treatment:  ? Avoid sexual activity until you finish treatment.  ? Do not douche.  ? Avoid alcohol as directed by your health care provider.  ? Avoid breastfeeding as directed by your health care provider.  · Drink enough water and fluids to keep your urine clear or pale yellow.  · Keep the area around your vagina and rectum clean.  ? Wash the area daily with warm water.  ? Wipe yourself from front to back after using the toilet.  · Keep all follow-up visits as told by your health care provider. This is important.  How is this prevented?  · Do not   douche.  · Wash the outside of your vagina with warm water only.  · Use protection when having sex. This includes latex condoms and dental dams.  · Limit how many sexual partners you have. To help prevent bacterial vaginosis, it is best to have sex with just one partner (monogamous).  · Make sure you and your sexual partner are tested for STIs.  · Wear cotton or cotton-lined underwear.  · Avoid wearing tight pants and pantyhose, especially during summer.  · Limit the amount of alcohol that you drink.  · Do not use any products that contain  nicotine or tobacco, such as cigarettes and e-cigarettes. If you need help quitting, ask your health care provider.  · Do not use illegal drugs.  Where to find more information  · Centers for Disease Control and Prevention: www.cdc.gov/std  · American Sexual Health Association (ASHA): www.ashastd.org  · U.S. Department of Health and Human Services, Office on Women's Health: www.womenshealth.gov/ or https://www.womenshealth.gov/a-z-topics/bacterial-vaginosis  Contact a health care provider if:  · Your symptoms do not improve, even after treatment.  · You have more discharge or pain when urinating.  · You have a fever.  · You have pain in your abdomen.  · You have pain during sex.  · You have vaginal bleeding between periods.  Summary  · Bacterial vaginosis is a vaginal infection that occurs when the normal balance of bacteria in the vagina is disrupted.  · Because bacterial vaginosis increases your risk for STIs (sexually transmitted infections), getting treated can help reduce your risk for chlamydia, gonorrhea, herpes, and HIV (human immunodeficiency virus). Treatment is also important for preventing complications in pregnant women, because the condition can cause an early (premature) delivery.  · This condition is treated with antibiotic medicines. These may be given as a pill, a vaginal cream, or a medicine that is put into the vagina (suppository).  This information is not intended to replace advice given to you by your health care provider. Make sure you discuss any questions you have with your health care provider.  Document Released: 12/16/2005 Document Revised: 04/21/2017 Document Reviewed: 08/31/2016  Elsevier Interactive Patient Education © 2019 [employer]vier Inc[employer]

## 2019-03-08 LAB — GC/CHLAMYDIA PROBE AMP (~~LOC~~) NOT AT ARMC
CHLAMYDIA, DNA PROBE: NEGATIVE
Neisseria Gonorrhea: NEGATIVE

## 2019-03-09 ENCOUNTER — Emergency Department (HOSPITAL_COMMUNITY): Payer: Medicaid Other

## 2019-03-09 ENCOUNTER — Encounter (HOSPITAL_COMMUNITY): Payer: Self-pay | Admitting: Emergency Medicine

## 2019-03-09 ENCOUNTER — Other Ambulatory Visit: Payer: Self-pay

## 2019-03-09 ENCOUNTER — Emergency Department (HOSPITAL_COMMUNITY)
Admission: EM | Admit: 2019-03-09 | Discharge: 2019-03-09 | Disposition: A | Discharge status: Home / Self Care | Payer: Medicaid Other | Attending: Emergency Medicine | Admitting: Emergency Medicine

## 2019-03-09 DIAGNOSIS — Y9389 Activity, other specified: Secondary | ICD-10-CM | POA: Insufficient documentation

## 2019-03-09 DIAGNOSIS — S99912A Unspecified injury of left ankle, initial encounter: Secondary | ICD-10-CM | POA: Diagnosis not present

## 2019-03-09 DIAGNOSIS — Z79899 Other long term (current) drug therapy: Secondary | ICD-10-CM | POA: Insufficient documentation

## 2019-03-09 DIAGNOSIS — F17210 Nicotine dependence, cigarettes, uncomplicated: Secondary | ICD-10-CM | POA: Diagnosis not present

## 2019-03-09 DIAGNOSIS — O9A211 Injury, poisoning and certain other consequences of external causes complicating pregnancy, first trimester: Secondary | ICD-10-CM | POA: Insufficient documentation

## 2019-03-09 DIAGNOSIS — W010XXA Fall on same level from slipping, tripping and stumbling without subsequent striking against object, initial encounter: Secondary | ICD-10-CM | POA: Insufficient documentation

## 2019-03-09 DIAGNOSIS — J45909 Unspecified asthma, uncomplicated: Secondary | ICD-10-CM | POA: Diagnosis not present

## 2019-03-09 DIAGNOSIS — S93422A Sprain of deltoid ligament of left ankle, initial encounter: Secondary | ICD-10-CM

## 2019-03-09 DIAGNOSIS — O99333 Smoking (tobacco) complicating pregnancy, third trimester: Secondary | ICD-10-CM | POA: Diagnosis not present

## 2019-03-09 DIAGNOSIS — O24419 Gestational diabetes mellitus in pregnancy, unspecified control: Secondary | ICD-10-CM | POA: Insufficient documentation

## 2019-03-09 DIAGNOSIS — Y999 Unspecified external cause status: Secondary | ICD-10-CM | POA: Diagnosis not present

## 2019-03-09 DIAGNOSIS — O99513 Diseases of the respiratory system complicating pregnancy, third trimester: Secondary | ICD-10-CM | POA: Diagnosis not present

## 2019-03-09 DIAGNOSIS — M25572 Pain in left ankle and joints of left foot: Secondary | ICD-10-CM | POA: Diagnosis not present

## 2019-03-09 DIAGNOSIS — S93402A Sprain of unspecified ligament of left ankle, initial encounter: Secondary | ICD-10-CM | POA: Insufficient documentation

## 2019-03-09 DIAGNOSIS — Y929 Unspecified place or not applicable: Secondary | ICD-10-CM | POA: Diagnosis not present

## 2019-03-09 DIAGNOSIS — Z3A32 32 weeks gestation of pregnancy: Secondary | ICD-10-CM | POA: Insufficient documentation

## 2019-03-09 NOTE — ED Provider Notes (Signed)
[facility] EMERGENCY DEPARTMENT Provider Note   CSN: [id number] Arrival date & time: 03/09/19  1729    History   Chief Complaint Chief Complaint  Patient presents with  . Fall    Sunday    HPI Peggy [name] is a 27 y.o. female.     HPI   Peggy [name] is a 28 y.o. pregnant female at [redacted] weeks gestation, D3O6712 who presents to the Emergency Department complaining of left lateral ankle pain for 2 days.  She states that she was trying to put her child in a car seat and stepped back falling on her buttocks.  She is unclear how the injury of her ankle occurred.  She describes the pain is worse with weightbearing.  She has taken Tylenol without relief.  She has continued to bear weight without support of her ankle.  She denies trauma of her abdomen, vaginal bleeding, pelvic pain, or having contractions.  She endorses regular fetal movement and routine prenatal care.    Past Medical History:  Diagnosis Date  . Asthma   . Gestational diabetes   . Pyloric stenosis     Patient Active Problem List   Diagnosis Date Noted  . History of gestational diabetes 10/28/2018  . Supervision of normal pregnancy 09/23/2018  . Low vitamin D level 02/12/2017  . Smoker 07/25/2014    Past Surgical History:  Procedure Laterality Date  . PYLOROMYOTOMY       OB History    Gravida  4   Para  3   Term  3   Preterm  0   AB  0   Living  3     SAB  0   TAB  0   Ectopic  0   Multiple  0   Live Births  3            Home Medications    Prior to Admission medications   Medication Sig Start Date End Date Taking? Authorizing Provider  acetaminophen (TYLENOL) 325 MG tablet Take 650 mg by mouth every 6 (six) hours as needed for mild pain or headache.    [provider]  albuterol (PROVENTIL HFA;VENTOLIN HFA) 108 (90 Base) MCG/ACT inhaler Inhale 2 puffs into the lungs every 6 (six) hours as needed for wheezing or shortness of breath. 01/13/19    Cresenzo-Dishmon, Joaquim Lai, CNM  albuterol (PROVENTIL) (2.5 MG/3ML) 0.083% nebulizer solution Take 3 mLs (2.5 mg total) by nebulization every 4 (four) hours as needed for wheezing or shortness of breath. Patient not taking: Reported on 02/23/2019 10/22/18   Orlie Dakin, MD  Doxylamine-Pyridoxine (DICLEGIS) 10-10 MG TBEC Take 2 qhs; may also take one in am and one in afternoon prn nausea 09/23/18   Cresenzo-Dishmon, Joaquim Lai, CNM  HYDROcodone-homatropine (HYCODAN) 5-1.5 MG/5ML syrup Take 5 mLs by mouth every 6 (six) hours as needed for cough. 02/24/19   Jonnie Kind, MD  metroNIDAZOLE (METROGEL VAGINAL) 0.75 % vaginal gel Place 1 Applicatorful vaginally at bedtime. Insert one applicator, at bedtime, for 5 nights. 03/07/19   Gavin Pound, CNM  pantoprazole (PROTONIX) 20 MG tablet Take 1 tablet (20 mg total) by mouth daily. 02/22/19   Roma Schanz, CNM  promethazine (PHENERGAN) 12.5 MG tablet Take 1 tablet (12.5 mg total) by mouth every 6 (six) hours as needed for nausea or vomiting. 10/19/18   Roma Schanz, CNM    Family History Family History  Problem Relation Age of Onset  . Arthritis Mother   . Cancer  Mother        thyroid  . Depression Mother   . Hyperlipidemia Father   . Asthma Father   . Hypertension Father   . Heart disease Father   . COPD Maternal Grandfather   . Diabetes Paternal Grandmother   . Arthritis Paternal Grandmother   . Asthma Paternal Grandmother   . Heart disease Paternal Grandmother   . Depression Paternal Grandmother   . Hypertension Paternal Grandmother   . Hyperlipidemia Paternal Grandmother   . Other Neg Hx     Social History Social History   Tobacco Use  . Smoking status: Current Every Day Smoker    Packs/day: 0.25    Years: 5.00    Pack years: 1.25    Types: Cigarettes  . Smokeless tobacco: Never Used  . Tobacco comment: 2 cig/day  Substance Use Topics  . Alcohol use: No  . Drug use: No     Allergies   Patient has no known  allergies.   Review of Systems Review of Systems  Constitutional: Negative for chills and fever.  Respiratory: Negative for cough and shortness of breath.   Cardiovascular: Negative for chest pain.  Gastrointestinal: Negative for abdominal pain, diarrhea and vomiting.  Genitourinary: Negative for difficulty urinating, dysuria, pelvic pain and vaginal bleeding.  Musculoskeletal: Positive for arthralgias (Left ankle pain and swelling). Negative for back pain and neck pain.  Skin: Negative for color change and wound.  Neurological: Negative for dizziness and headaches.     Physical Exam Updated Vital Signs BP 122/78 (BP Location: Right Arm)   Pulse 90   Temp 98.3 F (36.8 C) (Oral)   Resp 18   Ht 5\' 1"  (1.549 m)   Wt 68 kg   SpO2 97%   BMI 28.34 kg/m   Physical Exam Vitals signs and nursing note reviewed.  Constitutional:      General: She is not in acute distress.    Appearance: She is well-developed.  HENT:     Head: Atraumatic.  Neck:     Musculoskeletal: Normal range of motion.  Cardiovascular:     Rate and Rhythm: Normal rate and regular rhythm.     Pulses: Normal pulses.  Pulmonary:     Effort: Pulmonary effort is normal.     Breath sounds: Normal breath sounds.  Abdominal:     Comments: Abdomen is gravid, nontender  Musculoskeletal:        General: Tenderness and signs of injury present. No deformity.     Comments: Tenderness to palpation along the lateral left ankle.  Minimal edema noted.  No tenderness proximally.  Left foot is nontender.  Compartments are soft.  No abrasions or ecchymosis.  Skin:    General: Skin is warm.     Capillary Refill: Capillary refill takes less than 2 seconds.     Findings: No erythema.  Neurological:     General: No focal deficit present.     Mental Status: She is alert.     Motor: No abnormal muscle tone.     Coordination: Coordination normal.      ED Treatments / Results  Labs (all labs ordered are listed, but only  abnormal results are displayed) Labs Reviewed - No data to display  EKG None  Radiology Dg Ankle Complete Left  Result Date: 03/09/2019 CLINICAL DATA:  Pain and injury EXAM: LEFT ANKLE COMPLETE - 3+ VIEW COMPARISON:  None. FINDINGS: There is no evidence of fracture, dislocation, or joint effusion. There is no evidence of  arthropathy or other focal bone abnormality. Soft tissues are unremarkable. IMPRESSION: Negative. Electronically Signed   By: Donavan Foil M.D.   On: 03/09/2019 19:55    Procedures Procedures (including critical care time)  Medications Ordered in ED Medications - No data to display   Initial Impression / Assessment and Plan / ED Course  I have reviewed the triage vital signs and the nursing notes.  Pertinent labs & imaging results that were available during my care of the patient were reviewed by me and considered in my medical decision making (see chart for details).         FHT's  140   Patient with likely sprain of the left ankle.  No bony deformity noted.  Extremity is neurovascularly intact.  Compartments are soft.  I do not feel x-ray is indicated, but patient is requesting that x-ray be performed.  I have discussed radiation exposure risk and abdomen will be shielded.  X-ray negative for fracture, ASO applied.  Patient agrees to elevate ice and continue Tylenol.  Final Clinical Impressions(s) / ED Diagnoses   Final diagnoses:  Sprain of left medial ankle joint, initial encounter    ED Discharge Orders    None       Bufford Lope 03/09/19 2042    Milton Ferguson, MD 03/09/19 2337

## 2019-03-09 NOTE — Discharge Instructions (Addendum)
Elevate and apply ice packs on and off to your ankle.  Wear the brace as needed for weightbearing and support.  Your x-ray shows you have an ankle sprain.  This may take 1 to 2 weeks to completely heal.  You may take Tylenol every 4 hours if needed for pain.  Call Dr. Ruthe Mannan office to arrange a follow-up appointment if your ankle is not improving.

## 2019-03-09 NOTE — ED Triage Notes (Signed)
Fell in Sunday and c/o left ankle pain.  Rates pain 6/10 throbbing radiating to calf.  Pt was putting child in car seat, stepped back out of car and fell on her buttock.  Pt is [redacted] weeks pregnant.  Due May 5th.  Not having any contractions at this time.

## 2019-03-11 ENCOUNTER — Ambulatory Visit (INDEPENDENT_AMBULATORY_CARE_PROVIDER_SITE_OTHER): Payer: Medicaid Other | Admitting: Advanced Practice Midwife

## 2019-03-11 ENCOUNTER — Ambulatory Visit (INDEPENDENT_AMBULATORY_CARE_PROVIDER_SITE_OTHER): Payer: Medicaid Other

## 2019-03-11 ENCOUNTER — Other Ambulatory Visit: Payer: Self-pay

## 2019-03-11 ENCOUNTER — Encounter: Payer: Self-pay | Admitting: Advanced Practice Midwife

## 2019-03-11 VITALS — BP 138/80 | HR 112 | Wt 151.0 lb

## 2019-03-11 DIAGNOSIS — O3663X0 Maternal care for excessive fetal growth, third trimester, not applicable or unspecified: Secondary | ICD-10-CM | POA: Diagnosis not present

## 2019-03-11 DIAGNOSIS — Z3A32 32 weeks gestation of pregnancy: Secondary | ICD-10-CM

## 2019-03-11 DIAGNOSIS — Z3483 Encounter for supervision of other normal pregnancy, third trimester: Secondary | ICD-10-CM

## 2019-03-11 DIAGNOSIS — Z331 Pregnant state, incidental: Secondary | ICD-10-CM

## 2019-03-11 DIAGNOSIS — Z3403 Encounter for supervision of normal first pregnancy, third trimester: Secondary | ICD-10-CM

## 2019-03-11 DIAGNOSIS — Z1389 Encounter for screening for other disorder: Secondary | ICD-10-CM

## 2019-03-11 LAB — POCT URINALYSIS DIPSTICK OB
Blood, UA: NEGATIVE
Glucose, UA: NEGATIVE
Ketones, UA: NEGATIVE
Leukocytes, UA: NEGATIVE
Nitrite, UA: NEGATIVE
POC,PROTEIN,UA: NEGATIVE

## 2019-03-11 NOTE — Progress Notes (Signed)
[name] 90+3 wks,cephalic,anterior placenta gr 3,normal ovaries bilat,afi 12 cm,fhr 150 bpm,efw 2087 g 61%

## 2019-03-11 NOTE — Progress Notes (Signed)
  [id number] [redacted]w[redacted]d Estimated Date of Delivery: 05/04/19  Blood pressure 138/80, pulse (!) 112, weight 151 lb (68.5 kg), currently breastfeeding.   BP weight and urine results all reviewed and noted.  Please refer to the obstetrical flow sheet for the fundal height and fetal heart rate documentation:  Patient reports good fetal movement, denies any bleeding and no rupture of membranes symptoms or regular contractions. Patient fell on her buttocks 2 days ago, hurt her ankle, went to ED, insisted on x-ray (it was negative). Wearing a brace.  All questions were answered.   Physical Assessment:   Vitals:   03/11/19 1020  BP: 138/80  Pulse: (!) 112  Weight: 151 lb (68.5 kg)  Body mass index is 28.53 kg/m.        Physical Examination:   General appearance: Well appearing, and in no distress  Mental status: Alert, oriented to person, place, and time  Skin: Warm & dry  Cardiovascular: Normal heart rate noted  Respiratory: Normal respiratory effort, no distress  Abdomen: Soft, gravid, nontender  Pelvic: Cervical exam deferred         Extremities: Edema: None  Fetal Status:     Movement: Present    Results for orders placed or performed in visit on 03/11/19 (from the past 24 hour(s))  POC Urinalysis Dipstick OB   Collection Time: 03/11/19 10:22 AM  Result Value Ref Range   Color, UA     Clarity, UA     Glucose, UA Negative Negative   Bilirubin, UA     Ketones, UA neg    Spec Grav, UA     Blood, UA neg    pH, UA     POC,PROTEIN,UA Negative Negative, Trace, Small (1+), Moderate (2+), Large (3+), 4+   Urobilinogen, UA     Nitrite, UA neg    Leukocytes, UA Negative Negative   Appearance     Odor       Orders Placed This Encounter  Procedures  . POC Urinalysis Dipstick OB    Plan:  Continued routine obstetrical care,   No follow-ups on file.

## 2019-03-18 ENCOUNTER — Other Ambulatory Visit: Payer: Self-pay

## 2019-03-18 ENCOUNTER — Encounter: Payer: Self-pay | Admitting: Orthopaedic Surgery

## 2019-03-18 ENCOUNTER — Ambulatory Visit: Payer: Medicaid Other | Admitting: Orthopaedic Surgery

## 2019-03-18 VITALS — BP 134/91 | HR 108 | Ht 61.0 in | Wt 150.0 lb

## 2019-03-18 DIAGNOSIS — S96912A Strain of unspecified muscle and tendon at ankle and foot level, left foot, initial encounter: Secondary | ICD-10-CM

## 2019-03-18 NOTE — Progress Notes (Signed)
Subjective:    Patient ID: Peggy [name], female    DOB: 08-Jun-1991, 28 y.o.   MRN: [id number]  HPI She fell and twisted her left ankle on 03-07-2019. She had pain and swelling.  It did not get any better.  She went to the ER on 03-09-2019.  X-rays were done.  They were negative. She was given a brace.  She had no other injury. She is better now but it still is tender.  She is pregnant and due May 6th.  She had no injury to the fetus.     Review of Systems  Constitutional: Positive for activity change.  Respiratory: Positive for shortness of breath. Negative for cough.   Musculoskeletal: Positive for arthralgias, gait problem and joint swelling.  All other systems reviewed and are negative.  For Review of Systems, all other systems reviewed and are negative.  The following is a summary of the past history medically, past history surgically, known current medicines, social history and family history.  This information is gathered electronically by the computer from prior information and documentation.  I review this each visit and have found including this information at this point in the chart is beneficial and informative.   Past Medical History:  Diagnosis Date  . Asthma   . Gestational diabetes   . Pyloric stenosis     Past Surgical History:  Procedure Laterality Date  . PYLOROMYOTOMY      Current Outpatient Medications on File Prior to Visit  Medication Sig Dispense Refill  . acetaminophen (TYLENOL) 325 MG tablet Take 650 mg by mouth every 6 (six) hours as needed for mild pain or headache.    . albuterol (PROVENTIL HFA;VENTOLIN HFA) 108 (90 Base) MCG/ACT inhaler Inhale 2 puffs into the lungs every 6 (six) hours as needed for wheezing or shortness of breath. 1 Inhaler 2  . albuterol (PROVENTIL) (2.5 MG/3ML) 0.083% nebulizer solution Take 3 mLs (2.5 mg total) by nebulization every 4 (four) hours as needed for wheezing or shortness of breath. 75 mL 12  .  Doxylamine-Pyridoxine (DICLEGIS) 10-10 MG TBEC Take 2 qhs; may also take one in am and one in afternoon prn nausea (Patient not taking: Reported on 03/11/2019) 120 tablet 6  . HYDROcodone-homatropine (HYCODAN) 5-1.5 MG/5ML syrup Take 5 mLs by mouth every 6 (six) hours as needed for cough. (Patient not taking: Reported on 03/11/2019) 120 mL 0  . metroNIDAZOLE (METROGEL VAGINAL) 0.75 % vaginal gel Place 1 Applicatorful vaginally at bedtime. Insert one applicator, at bedtime, for 5 nights. 70 g 0  . pantoprazole (PROTONIX) 20 MG tablet Take 1 tablet (20 mg total) by mouth daily. 30 tablet 6  . promethazine (PHENERGAN) 12.5 MG tablet Take 1 tablet (12.5 mg total) by mouth every 6 (six) hours as needed for nausea or vomiting. (Patient not taking: Reported on 03/11/2019) 30 tablet 0   No current facility-administered medications on file prior to visit.     Social History   Socioeconomic History  . Marital status: Legally Separated    Spouse name: Loveta Dellis  . Number of children: 2  . Years of education: Not on file  . Highest education level: Some college, no degree  Occupational History  . Not on file  Social Needs  . Financial resource strain: Not hard at all  . Food insecurity:    Worry: Never true    Inability: Never true  . Transportation needs:    Medical: No    Non-medical: No  Tobacco  Use  . Smoking status: Current Every Day Smoker    Packs/day: 0.25    Years: 5.00    Pack years: 1.25    Types: Cigarettes  . Smokeless tobacco: Never Used  . Tobacco comment: 2 cig/day  Substance and Sexual Activity  . Alcohol use: No  . Drug use: No  . Sexual activity: Not Currently    Birth control/protection: None  Lifestyle  . Physical activity:    Days per week: 2 days    Minutes per session: 30 min  . Stress: To some extent  Relationships  . Social connections:    Talks on phone: Three times a week    Gets together: Once a week    Attends religious service: 1 to 4 times per  year    Active member of club or organization: No    Attends meetings of clubs or organizations: Never    Relationship status: Separated  . Intimate partner violence:    Fear of current or ex partner: No    Emotionally abused: No    Physically abused: No    Forced sexual activity: No  Other Topics Concern  . Not on file  Social History Narrative  . Not on file    Family History  Problem Relation Age of Onset  . Arthritis Mother   . Cancer Mother        thyroid  . Depression Mother   . Hyperlipidemia Father   . Asthma Father   . Hypertension Father   . Heart disease Father   . COPD Maternal Grandfather   . Diabetes Paternal Grandmother   . Arthritis Paternal Grandmother   . Asthma Paternal Grandmother   . Heart disease Paternal Grandmother   . Depression Paternal Grandmother   . Hypertension Paternal Grandmother   . Hyperlipidemia Paternal Grandmother   . Other Neg Hx     BP (!) 134/91   Pulse (!) 108   Ht 5\' 1"  (1.549 m)   Wt 150 lb (68 kg)   BMI 28.34 kg/m   Body mass index is 28.34 kg/m.      Objective:   Physical Exam Vitals signs reviewed.  Constitutional:      Appearance: She is well-developed.  HENT:     Head: Normocephalic and atraumatic.  Eyes:     Conjunctiva/sclera: Conjunctivae normal.     Pupils: Pupils are equal, round, and reactive to light.  Neck:     Musculoskeletal: Normal range of motion and neck supple.  Cardiovascular:     Rate and Rhythm: Normal rate and regular rhythm.  Pulmonary:     Effort: Pulmonary effort is normal.  Abdominal:     Palpations: Abdomen is soft.  Musculoskeletal:     Left ankle: She exhibits swelling and ecchymosis. Tenderness. AITFL tenderness found.       Feet:  Skin:    General: Skin is warm and dry.  Neurological:     Mental Status: She is alert and oriented to person, place, and time.     Cranial Nerves: No cranial nerve deficit.     Motor: No abnormal muscle tone.     Coordination: Coordination  normal.     Deep Tendon Reflexes: Reflexes are normal and symmetric. Reflexes normal.  Psychiatric:        Behavior: Behavior normal.        Thought Content: Thought content normal.        Judgment: Judgment normal.   She is pregnant.   I  have reviewed the x-rays, report and ER record.     Assessment & Plan:   Encounter Diagnosis  Name Primary?  . Strain of left ankle, initial encounter Yes   I have given contrast bath sheet of instructions and directions.  Elevate and ice, continue brace.  Return in two weeks.  If doing well, call and cancel.  Call if any problem.  Precautions discussed.   Electronically Signed Sanjuana Kava, MD 3/19/20209:11 AM

## 2019-03-23 ENCOUNTER — Encounter: Payer: Self-pay | Admitting: Obstetrics & Gynecology

## 2019-03-23 ENCOUNTER — Other Ambulatory Visit: Payer: Self-pay

## 2019-03-23 ENCOUNTER — Ambulatory Visit (INDEPENDENT_AMBULATORY_CARE_PROVIDER_SITE_OTHER): Payer: Medicaid Other | Admitting: Obstetrics & Gynecology

## 2019-03-23 VITALS — BP 133/79 | HR 122 | Wt 152.0 lb

## 2019-03-23 DIAGNOSIS — Z3A34 34 weeks gestation of pregnancy: Secondary | ICD-10-CM

## 2019-03-23 DIAGNOSIS — Z3483 Encounter for supervision of other normal pregnancy, third trimester: Secondary | ICD-10-CM

## 2019-03-23 DIAGNOSIS — Z1389 Encounter for screening for other disorder: Secondary | ICD-10-CM

## 2019-03-23 DIAGNOSIS — Z331 Pregnant state, incidental: Secondary | ICD-10-CM

## 2019-03-23 LAB — POCT URINALYSIS DIPSTICK OB
Glucose, UA: NEGATIVE
Ketones, UA: NEGATIVE
Leukocytes, UA: NEGATIVE
Nitrite, UA: NEGATIVE

## 2019-03-23 NOTE — Progress Notes (Signed)
   LOW-RISK PREGNANCY VISIT Patient name: Peggy [name] MRN [id number]  Date of birth: [birth year] Chief Complaint:   Routine Prenatal Visit  History of Present Illness:   Peggy [name] is a 28 y.o. [phone] female at [redacted]w[redacted]d with an Estimated Date of Delivery: 05/04/19 being seen today for ongoing management of a low-risk pregnancy.  Today she reports no complaints. Contractions: Irregular. Vag. Bleeding: None.  Movement: Present. denies leaking of fluid. Review of Systems:   Pertinent items are noted in HPI Denies abnormal vaginal discharge w/ itching/odor/irritation, headaches, visual changes, shortness of breath, chest pain, abdominal pain, severe nausea/vomiting, or problems with urination or bowel movements unless otherwise stated above. Pertinent History Reviewed:  Reviewed past medical,surgical, social, obstetrical and family history.  Reviewed problem list, medications and allergies. Physical Assessment:   Vitals:   03/23/19 0918  BP: 133/79  Pulse: (!) 122  Weight: 152 lb (68.9 kg)  Body mass index is 28.72 kg/m.        Physical Examination:   General appearance: Well appearing, and in no distress  Mental status: Alert, oriented to person, place, and time  Skin: Warm & dry  Cardiovascular: Normal heart rate noted  Respiratory: Normal respiratory effort, no distress  Abdomen: Soft, gravid, nontender  Pelvic: Cervical exam deferred         Extremities: Edema: Trace  Fetal Status:     Movement: Present    Results for orders placed or performed in visit on 03/23/19 (from the past 24 hour(s))  POC Urinalysis Dipstick OB   Collection Time: 03/23/19  9:19 AM  Result Value Ref Range   Color, UA     Clarity, UA     Glucose, UA Negative Negative   Bilirubin, UA     Ketones, UA neg    Spec Grav, UA     Blood, UA meg    pH, UA     POC,PROTEIN,UA Trace Negative, Trace, Small (1+), Moderate (2+), Large (3+), 4+   Urobilinogen, UA     Nitrite, UA neg    Leukocytes,  UA Negative Negative   Appearance     Odor      Assessment & Plan:  1) Low-risk pregnancy G4P3003 at [redacted]w[redacted]d with an Estimated Date of Delivery: 05/04/19   2) FH is lower but her EFW on 3/12 was 61%   Meds: No orders of the defined types were placed in this encounter.  Labs/procedures today:   Plan:  Continue routine obstetrical care   Reviewed: Preterm labor symptoms and general obstetric precautions including but not limited to vaginal bleeding, contractions, leaking of fluid and fetal movement were reviewed in detail with the patient.  All questions were answered  Follow-up: Return in about 2 weeks (around 04/06/2019) for [place].  Orders Placed This Encounter  Procedures  . POC Urinalysis Dipstick OB   Florian Buff  03/23/2019 9:32 AM

## 2019-03-26 ENCOUNTER — Encounter: Payer: Medicaid Other | Admitting: Obstetrics & Gynecology

## 2019-03-29 ENCOUNTER — Telehealth: Payer: Self-pay | Admitting: Advanced Practice Midwife

## 2019-03-29 ENCOUNTER — Telehealth: Payer: Self-pay | Admitting: *Deleted

## 2019-03-29 NOTE — Telephone Encounter (Signed)
Patient called stating that she been having some braxton hicks quit frequently and she is also having pressure on her vagina. Please contact pt

## 2019-03-29 NOTE — Telephone Encounter (Signed)
Patient called regarding previous message. Please contact patient

## 2019-03-29 NOTE — Telephone Encounter (Signed)
Pt called concerned that she is going to go into labor and not have anyone to take care of her kids. She asks if she can get an induction date set up. Advised that she is only 34 weeks and that is too early to set up an induction date. She says that her other children were born at 61 and 33 weeks and she almost did not get to the hospital in time. Advised that she could use this time to try and find someone who will take care of her other children when she does go into labor. She states that her closest family lives in [place]. Advised to try and work out a plan with them. She states that she has been having braxton hicks contractions, she had 5 the last hour. I advised that she may have these up until delivery. Advised to call us back when the contractions become 5 min apart consistently. Pt denies vaginal bleeding or leaking of fluid. States that baby is moving well. Advised to keep her appt on April 7 or to call back with any other concerns. Advised pt to try and not worry, that everything should work out. Pt verbalized understanding.

## 2019-03-29 NOTE — Telephone Encounter (Signed)
Patient states she has already talked with someone from our office. No further questions or needs at this time.

## 2019-03-30 ENCOUNTER — Other Ambulatory Visit: Payer: Self-pay | Admitting: Obstetrics & Gynecology

## 2019-03-31 ENCOUNTER — Telehealth: Payer: Self-pay | Admitting: *Deleted

## 2019-03-31 NOTE — Telephone Encounter (Signed)
Patient states she is uncomfortable when she walks as the baby has dropped down into her pelvis.  She is not having any contractions, bleeding or leaking.  States she has had her 2 other children 2 weeks early.  I informed her that we could see her tomorrow but that she would not be induced until after 40 weeks and if she was not in labor, she would be sent home.  States she has 3 other children and just doesn't know what to do.  I advised patient that since the children cannot go with her to the hospital, she should find care for them now when the time came for her to deliver.  Informed labor could come at any time and I would not want her to be caught off guard and not have care for her children. Pt verbalized understanding. Appt moved to tomorrow per pt request.   Informed of no visitor policy and prescreen questions asked and answered no.

## 2019-04-01 ENCOUNTER — Encounter: Payer: Self-pay | Admitting: Advanced Practice Midwife

## 2019-04-01 ENCOUNTER — Encounter: Payer: Self-pay | Admitting: Orthopaedic Surgery

## 2019-04-01 ENCOUNTER — Other Ambulatory Visit: Payer: Self-pay

## 2019-04-01 ENCOUNTER — Ambulatory Visit (INDEPENDENT_AMBULATORY_CARE_PROVIDER_SITE_OTHER): Payer: Medicaid Other | Admitting: Orthopaedic Surgery

## 2019-04-01 ENCOUNTER — Ambulatory Visit (INDEPENDENT_AMBULATORY_CARE_PROVIDER_SITE_OTHER): Payer: Medicaid Other | Admitting: Advanced Practice Midwife

## 2019-04-01 VITALS — BP 126/58 | HR 118 | Wt 155.0 lb

## 2019-04-01 DIAGNOSIS — O368130 Decreased fetal movements, third trimester, not applicable or unspecified: Secondary | ICD-10-CM | POA: Diagnosis not present

## 2019-04-01 DIAGNOSIS — Z1389 Encounter for screening for other disorder: Secondary | ICD-10-CM

## 2019-04-01 DIAGNOSIS — Z3483 Encounter for supervision of other normal pregnancy, third trimester: Secondary | ICD-10-CM | POA: Diagnosis not present

## 2019-04-01 DIAGNOSIS — S96912A Strain of unspecified muscle and tendon at ankle and foot level, left foot, initial encounter: Secondary | ICD-10-CM

## 2019-04-01 DIAGNOSIS — Z3A35 35 weeks gestation of pregnancy: Secondary | ICD-10-CM | POA: Diagnosis not present

## 2019-04-01 DIAGNOSIS — Z331 Pregnant state, incidental: Secondary | ICD-10-CM

## 2019-04-01 LAB — POCT URINALYSIS DIPSTICK OB
Glucose, UA: NEGATIVE
Ketones, UA: NEGATIVE
Leukocytes, UA: NEGATIVE
Nitrite, UA: NEGATIVE
POC,PROTEIN,UA: NEGATIVE

## 2019-04-01 NOTE — Patient Instructions (Signed)

## 2019-04-01 NOTE — Progress Notes (Signed)
Virtual Visit via Telephone Note  I connected with Peggy [name] on 04/01/19 at  8:40 AM EDT by telephone and verified that I am speaking with the correct person using two identifiers.   I discussed the limitations, risks, security and privacy concerns of performing an evaluation and management service by telephone and the availability of in person appointments. I also discussed with the patient that there may be a patient responsible charge related to this service. The patient expressed understanding and agreed to proceed.   History of Present Illness: She has strain of the left ankle and she is [redacted] weeks pregnant.  She has less pain of the ankle but both ankles are swollen secondary to pregnancy.  She has no redness, no new trauma.   Observations/Objective:  Per above  Assessment and Plan: Strain of the left ankle, improving  Follow Up Instructions:   PRN.  To call if gets worse.  I discussed the assessment and treatment plan with the patient. The patient was provided an opportunity to ask questions and all were answered. The patient agreed with the plan and demonstrated an understanding of the instructions.   The patient was advised to call back or seek an in-person evaluation if the symptoms worsen or if the condition fails to improve as anticipated.  I provided 5 minutes of non-face-to-face time during this encounter.   Sanjuana Kava, MD

## 2019-04-01 NOTE — Progress Notes (Signed)
  [id number] [redacted]w[redacted]d Estimated Date of Delivery: 05/04/19  Blood pressure (!) 126/58, pulse (!) 118, weight 155 lb (70.3 kg), currently breastfeeding.   BP weight and urine results all reviewed and noted.  Please refer to the obstetrical flow sheet for the fundal height and fetal heart rate documentation:  Patient reports good fetal movement, denies any bleeding and no rupture of membranes symptoms or regular contractions. Patient is without complaints. All questions were answered.   Physical Assessment:   Vitals:   04/01/19 0951 04/01/19 0953  BP: 137/83 (!) 126/58  Pulse: (!) 118   Weight: 155 lb (70.3 kg)   Body mass index is 29.29 kg/m.        Physical Examination:   General appearance: Well appearing, and in no distress  Mental status: Alert, oriented to person, place, and time  Skin: Warm & dry  Cardiovascular: Normal heart rate noted  Respiratory: Normal respiratory effort, no distress  Abdomen: Soft, gravid, nontender  Pelvic: Cervical exam performed  Dilation: 1 Effacement (%): 50 Station: -2  Extremities: Edema: Trace  Fetal Status: Fetal Heart Rate (bpm): 150 Fundal Height: 33 cm Movement: (!) Decreased Presentation: Vertex NST: FHR baseline 150 bpm, Variability: moderate, Accelerations:present, Decelerations:  Absent= Cat 1/Reactive  Baby very active now  Results for orders placed or performed in visit on 04/01/19 (from the past 24 hour(s))  POC Urinalysis Dipstick OB   Collection Time: 04/01/19  9:53 AM  Result Value Ref Range   Color, UA     Clarity, UA     Glucose, UA Negative Negative   Bilirubin, UA     Ketones, UA neg    Spec Grav, UA     Blood, UA moderate    pH, UA     POC,PROTEIN,UA Negative Negative, Trace, Small (1+), Moderate (2+), Large (3+), 4+   Urobilinogen, UA     Nitrite, UA neg    Leukocytes, UA Negative Negative   Appearance     Odor       Orders Placed This Encounter  Procedures  . GC/Chlamydia Probe Amp  . Culture, beta strep (group  b only)  . POC Urinalysis Dipstick OB    Plan:  Continued routine obstetrical care, blood pressure cuff lent to pt  Return in about 1 week (around 04/08/2019) for tele visit [facility] (not webex).

## 2019-04-03 LAB — GC/CHLAMYDIA PROBE AMP
Chlamydia trachomatis, NAA: NEGATIVE
Neisseria Gonorrhoeae by PCR: NEGATIVE

## 2019-04-04 LAB — CULTURE, BETA STREP (GROUP B ONLY): Strep Gp B Culture: POSITIVE — AB

## 2019-04-05 ENCOUNTER — Telehealth: Payer: Self-pay | Admitting: *Deleted

## 2019-04-05 ENCOUNTER — Encounter: Payer: Self-pay | Admitting: Advanced Practice Midwife

## 2019-04-05 NOTE — Telephone Encounter (Signed)
Pt states that her panties have been constantly wet and is concerned that she may be ruptured. Pt was advised to come in for appt to be evaluated but states that she doesn't get off of work until 5 and we will be closed. I then advised pt that she should go to [facility] for evaluation after she gets off of work. Pt states that she can not do that either because she has kids. Advised pt that she really needs to be checked out and not wait, that if she is ruptured for more than 24 hours it increases her risk for infection. Pt verbalized understanding.

## 2019-04-06 ENCOUNTER — Encounter: Payer: Self-pay | Admitting: Obstetrics & Gynecology

## 2019-04-06 ENCOUNTER — Encounter: Payer: Medicaid Other | Admitting: Women's Health

## 2019-04-06 ENCOUNTER — Ambulatory Visit (INDEPENDENT_AMBULATORY_CARE_PROVIDER_SITE_OTHER): Payer: Medicaid Other | Admitting: Obstetrics & Gynecology

## 2019-04-06 ENCOUNTER — Encounter: Payer: Medicaid Other | Admitting: Obstetrics & Gynecology

## 2019-04-06 ENCOUNTER — Other Ambulatory Visit: Payer: Self-pay

## 2019-04-06 VITALS — BP 123/77 | HR 106 | Temp 99.0°F | Wt 157.0 lb

## 2019-04-06 DIAGNOSIS — Z3A36 36 weeks gestation of pregnancy: Secondary | ICD-10-CM

## 2019-04-06 DIAGNOSIS — Z1389 Encounter for screening for other disorder: Secondary | ICD-10-CM

## 2019-04-06 DIAGNOSIS — O368130 Decreased fetal movements, third trimester, not applicable or unspecified: Secondary | ICD-10-CM

## 2019-04-06 DIAGNOSIS — Z3483 Encounter for supervision of other normal pregnancy, third trimester: Secondary | ICD-10-CM

## 2019-04-06 DIAGNOSIS — Z331 Pregnant state, incidental: Secondary | ICD-10-CM

## 2019-04-06 LAB — POCT URINALYSIS DIPSTICK OB
Blood, UA: 3
Glucose, UA: NEGATIVE
Ketones, UA: NEGATIVE
Leukocytes, UA: NEGATIVE
Nitrite, UA: NEGATIVE
POC,PROTEIN,UA: NEGATIVE

## 2019-04-06 MED ORDER — TERCONAZOLE 0.4 % VA CREA: 1.0000 | TOPICAL_CREAM | Freq: Every day | VAGINAL | 0 refills | Status: DC

## 2019-04-06 NOTE — Progress Notes (Signed)
   LOW-RISK PREGNANCY VISIT Patient name: Peggy [name] MRN [id number]  Date of birth: [birth year] Chief Complaint:   Routine Prenatal Visit (think water broke/ decrease fetal movement)  History of Present Illness:   Peggy [name] is a 28 y.o. [phone] female at [redacted]w[redacted]d with an Estimated Date of Delivery: 05/04/19 being seen today for ongoing management of a low-risk pregnancy.  Today she reports vaginal discharge concerned about having BV, also with less FM this am. Contractions: Irregular.  .  Movement: (!) Decreased. unsure leaking of fluid. Review of Systems:   Pertinent items are noted in HPI Denies abnormal vaginal discharge w/ itching/odor/irritation, headaches, visual changes, shortness of breath, chest pain, abdominal pain, severe nausea/vomiting, or problems with urination or bowel movements unless otherwise stated above. Pertinent History Reviewed:  Reviewed past medical,surgical, social, obstetrical and family history.  Reviewed problem list, medications and allergies. Physical Assessment:   Vitals:   04/06/19 0850  BP: 123/77  Pulse: (!) 106  Temp: 99 F (37.2 C)  Weight: 157 lb (71.2 kg)  Body mass index is 29.66 kg/m.        Physical Examination:   General appearance: Well appearing, and in no distress  Mental status: Alert, oriented to person, place, and time  Skin: Warm & dry  Cardiovascular: Normal heart rate noted  Respiratory: Normal respiratory effort, no distress  Abdomen: Soft, gravid, nontender  Pelvic: Cervical exam performed       1.5-2/th/-2  Extremities: Edema: None SSE no pool no nitrazine Fetal Status:     Movement: (!) Decreased    Results for orders placed or performed in visit on 04/06/19 (from the past 24 hour(s))  POC Urinalysis Dipstick OB   Collection Time: 04/06/19  9:00 AM  Result Value Ref Range   Color, UA     Clarity, UA     Glucose, UA Negative Negative   Bilirubin, UA     Ketones, UA neg    Spec Grav, UA     Blood, UA 3     pH, UA     POC,PROTEIN,UA Negative Negative, Trace, Small (1+), Moderate (2+), Large (3+), 4+   Urobilinogen, UA     Nitrite, UA neg    Leukocytes, UA Negative Negative   Appearance     Odor      Assessment & Plan:  1) Low-risk pregnancy G4P3003 at [redacted]w[redacted]d with an Estimated Date of Delivery: 05/04/19   2) Yeast vaginitis, not ROM, s/p treatment with BV,   3) decreased fetal movement, reactive NST  [place] is at [redacted]w[redacted]d Estimated Date of Delivery: 05/04/19  NST being performed due to decreased movement  Today the NST is Reactive  Fetal Monitoring:  Baseline: 130 bpm, Variability: Good {> 6 bpm), Accelerations: Reactive and Decelerations: Absent   reactive  The accelerations are >15 bpm and more than 2 in 20 minutes  Final diagnosis:  Reactive NST  Florian Buff, MD      Meds: No orders of the defined types were placed in this encounter.  Labs/procedures today: cultures done  Plan:  Continue routine obstetrical care   Reviewed: Term labor symptoms and general obstetric precautions including but not limited to vaginal bleeding, contractions, leaking of fluid and fetal movement were reviewed in detail with the patient.  All questions were answered  Follow-up: No follow-ups on file.  Orders Placed This Encounter  Procedures  . POC Urinalysis Dipstick OB   Florian Buff CNM, [facility] 04/06/2019 9:42 AM

## 2019-04-07 LAB — GC/CHLAMYDIA PROBE AMP
Chlamydia trachomatis, NAA: NEGATIVE
Neisseria Gonorrhoeae by PCR: NEGATIVE

## 2019-04-08 ENCOUNTER — Encounter: Payer: Medicaid Other | Admitting: Advanced Practice Midwife

## 2019-04-08 LAB — STREP GP B NAA: Strep Gp B NAA: POSITIVE — AB

## 2019-04-12 ENCOUNTER — Encounter: Payer: Self-pay | Admitting: *Deleted

## 2019-04-13 ENCOUNTER — Encounter: Payer: Medicaid Other | Admitting: Women's Health

## 2019-04-14 ENCOUNTER — Telehealth: Payer: Self-pay | Admitting: *Deleted

## 2019-04-14 NOTE — Telephone Encounter (Signed)
Patient called, she stated that when she was in [place] just now she felt a gush, she's wanting to come in.  Please advise.  [phone]

## 2019-04-14 NOTE — Telephone Encounter (Signed)
Patient states she felt a gush of milky liquid come out while walking in [name]. No contractions or cramping. Since getting home, she has not felt anything else.  Advised patient to change panties and if she continues to leak, to call us back.  Informed it sounded like she still had some yeast but we could check if still coming out. Verbalized understanding.

## 2019-04-21 ENCOUNTER — Encounter: Payer: Self-pay | Admitting: *Deleted

## 2019-04-22 ENCOUNTER — Other Ambulatory Visit: Payer: Self-pay

## 2019-04-22 ENCOUNTER — Ambulatory Visit (INDEPENDENT_AMBULATORY_CARE_PROVIDER_SITE_OTHER): Payer: Medicaid Other | Admitting: Obstetrics & Gynecology

## 2019-04-22 VITALS — BP 137/90 | HR 84 | Wt 158.0 lb

## 2019-04-22 DIAGNOSIS — Z1389 Encounter for screening for other disorder: Secondary | ICD-10-CM

## 2019-04-22 DIAGNOSIS — Z3A38 38 weeks gestation of pregnancy: Secondary | ICD-10-CM

## 2019-04-22 DIAGNOSIS — O1213 Gestational proteinuria, third trimester: Secondary | ICD-10-CM

## 2019-04-22 DIAGNOSIS — Z331 Pregnant state, incidental: Secondary | ICD-10-CM

## 2019-04-22 DIAGNOSIS — Z3483 Encounter for supervision of other normal pregnancy, third trimester: Secondary | ICD-10-CM

## 2019-04-22 LAB — POCT URINALYSIS DIPSTICK OB
Blood, UA: NEGATIVE
Glucose, UA: NEGATIVE
Ketones, UA: NEGATIVE
Nitrite, UA: NEGATIVE

## 2019-04-22 NOTE — Progress Notes (Signed)
   LOW-RISK PREGNANCY VISIT Patient name: Peggy [name] MRN [id number]  Date of birth: 11-15-1991 Chief Complaint:   Routine Prenatal Visit  History of Present Illness:   Peggy [name] is a 28 y.o. [phone] female at [redacted]w[redacted]d with an Estimated Date of Delivery: 05/04/19 being seen today for ongoing management of a low-risk pregnancy.  Today she reports backache. Contractions: Irregular. Vag. Bleeding: None.  Movement: Present. denies leaking of fluid. Review of Systems:   Pertinent items are noted in HPI Denies abnormal vaginal discharge w/ itching/odor/irritation, headaches, visual changes, shortness of breath, chest pain, abdominal pain, severe nausea/vomiting, or problems with urination or bowel movements unless otherwise stated above. Pertinent History Reviewed:  Reviewed past medical,surgical, social, obstetrical and family history.  Reviewed problem list, medications and allergies. Physical Assessment:   Vitals:   04/22/19 0841 04/22/19 0846  BP: 136/88 137/90  Pulse: 84   Weight: 158 lb (71.7 kg)   Body mass index is 29.85 kg/m.        Physical Examination:   General appearance: Well appearing, and in no distress  Mental status: Alert, oriented to person, place, and time  Skin: Warm & dry  Cardiovascular: Normal heart rate noted  Respiratory: Normal respiratory effort, no distress  Abdomen: Soft, gravid, nontender  Pelvic: Cervical exam performed  Dilation: 2 Effacement (%): 50 Station: -3  Extremities: Edema: None  Fetal Status: Fetal Heart Rate (bpm): 136 Fundal Height: 35 cm Movement: Present Presentation: Vertex  Results for orders placed or performed in visit on 04/22/19 (from the past 24 hour(s))  POC Urinalysis Dipstick OB   Collection Time: 04/22/19  8:44 AM  Result Value Ref Range   Color, UA     Clarity, UA     Glucose, UA Negative Negative   Bilirubin, UA     Ketones, UA neg    Spec Grav, UA     Blood, UA neg    pH, UA     POC,PROTEIN,UA Small  (1+) Negative, Trace, Small (1+), Moderate (2+), Large (3+), 4+   Urobilinogen, UA     Nitrite, UA neg    Leukocytes, UA Small (1+) (A) Negative   Appearance     Odor      Assessment & Plan:  1) Low-risk pregnancy [id number] at [redacted]w[redacted]d with an Estimated Date of Delivery: 05/04/19   2) BP is creeping up, will check Pr/Cr today, see back in office on short interval, 4 days, to evaluate BP changes and any protein changes    Meds: No orders of the defined types were placed in this encounter.  Labs/procedures today: Pr/cr ratio  Plan:  Continue routine obstetrical care , as above  Reviewed: Term labor symptoms and general obstetric precautions including but not limited to vaginal bleeding, contractions, leaking of fluid and fetal movement were reviewed in detail with the patient.  All questions were answered  Follow-up: Return in about 4 days (around 04/26/2019) for [place].  Orders Placed This Encounter  Procedures  . Protein / creatinine ratio, urine  . POC Urinalysis Dipstick OB   Mertie Clause Quantez Schnyder  04/22/2019 9:04 AM

## 2019-04-23 LAB — PROTEIN / CREATININE RATIO, URINE
Creatinine, Urine: 159.2 mg/dL
Protein, Ur: 42 mg/dL
Protein/Creat Ratio: 264 mg/g creat — ABNORMAL HIGH (ref 0–200)

## 2019-04-26 ENCOUNTER — Other Ambulatory Visit: Payer: Self-pay

## 2019-04-26 ENCOUNTER — Other Ambulatory Visit: Payer: Medicaid Other | Admitting: Obstetrics and Gynecology

## 2019-04-26 ENCOUNTER — Inpatient Hospital Stay (HOSPITAL_COMMUNITY)
Admission: AD | Admit: 2019-04-26 | Discharge: 2019-04-28 | DRG: 807 | Disposition: A | Discharge status: Home / Self Care | Payer: Medicaid Other | Attending: Obstetrics & Gynecology | Admitting: Obstetrics & Gynecology

## 2019-04-26 ENCOUNTER — Encounter (HOSPITAL_COMMUNITY): Payer: Self-pay

## 2019-04-26 DIAGNOSIS — O26893 Other specified pregnancy related conditions, third trimester: Secondary | ICD-10-CM | POA: Diagnosis present

## 2019-04-26 DIAGNOSIS — O99824 Streptococcus B carrier state complicating childbirth: Principal | ICD-10-CM | POA: Diagnosis present

## 2019-04-26 DIAGNOSIS — F17200 Nicotine dependence, unspecified, uncomplicated: Secondary | ICD-10-CM | POA: Diagnosis present

## 2019-04-26 DIAGNOSIS — O99334 Smoking (tobacco) complicating childbirth: Secondary | ICD-10-CM | POA: Diagnosis present

## 2019-04-26 DIAGNOSIS — F17210 Nicotine dependence, cigarettes, uncomplicated: Secondary | ICD-10-CM | POA: Diagnosis present

## 2019-04-26 DIAGNOSIS — Z9189 Other specified personal risk factors, not elsewhere classified: Secondary | ICD-10-CM

## 2019-04-26 DIAGNOSIS — F1210 Cannabis abuse, uncomplicated: Secondary | ICD-10-CM | POA: Diagnosis present

## 2019-04-26 DIAGNOSIS — O99324 Drug use complicating childbirth: Secondary | ICD-10-CM | POA: Diagnosis present

## 2019-04-26 DIAGNOSIS — Z3A38 38 weeks gestation of pregnancy: Secondary | ICD-10-CM | POA: Diagnosis not present

## 2019-04-26 DIAGNOSIS — B951 Streptococcus, group B, as the cause of diseases classified elsewhere: Secondary | ICD-10-CM | POA: Diagnosis present

## 2019-04-26 LAB — TYPE AND SCREEN
ABO/RH(D): A POS
Antibody Screen: NEGATIVE

## 2019-04-26 LAB — CBC
HCT: 35.5 % — ABNORMAL LOW (ref 36.0–46.0)
Hemoglobin: 11.6 g/dL — ABNORMAL LOW (ref 12.0–15.0)
MCH: 27.8 pg (ref 26.0–34.0)
MCHC: 32.7 g/dL (ref 30.0–36.0)
MCV: 84.9 fL (ref 80.0–100.0)
Platelets: 340 10*3/uL (ref 150–400)
RBC: 4.18 MIL/uL (ref 3.87–5.11)
RDW: 13.7 % (ref 11.5–15.5)
WBC: 13.2 10*3/uL — ABNORMAL HIGH (ref 4.0–10.5)
nRBC: 0 % (ref 0.0–0.2)

## 2019-04-26 LAB — RPR: RPR Ser Ql: NONREACTIVE

## 2019-04-26 MED ORDER — ONDANSETRON HCL 4 MG/2ML IJ SOLN: 4.0000 mg | INTRAMUSCULAR | Status: DC | PRN

## 2019-04-26 MED ORDER — LIDOCAINE HCL (PF) 1 % IJ SOLN: 30.0000 mL | INTRAMUSCULAR | Status: DC | PRN

## 2019-04-26 MED ORDER — TETANUS-DIPHTH-ACELL PERTUSSIS 5-2.5-18.5 LF-MCG/0.5 IM SUSP: 0.5000 mL | Freq: Once | INTRAMUSCULAR | Status: DC

## 2019-04-26 MED ORDER — WITCH HAZEL-GLYCERIN EX PADS: 1.0000 "application " | MEDICATED_PAD | CUTANEOUS | Status: DC | PRN

## 2019-04-26 MED ORDER — ONDANSETRON HCL 4 MG/2ML IJ SOLN: 4.0000 mg | Freq: Four times a day (QID) | INTRAMUSCULAR | Status: DC | PRN

## 2019-04-26 MED ORDER — SENNOSIDES-DOCUSATE SODIUM 8.6-50 MG PO TABS
2.0000 | ORAL_TABLET | ORAL | Status: DC
  Administered 2019-04-26 – 2019-04-28 (×2): 2 via ORAL
  Filled 2019-04-26 (×2): qty 2

## 2019-04-26 MED ORDER — OXYTOCIN 10 UNIT/ML IJ SOLN
10.0000 [IU] | Freq: Once | INTRAMUSCULAR | Status: AC
  Administered 2019-04-26: 10 [IU] via INTRAMUSCULAR

## 2019-04-26 MED ORDER — OXYTOCIN 40 UNITS IN NORMAL SALINE INFUSION - SIMPLE MED: 2.5000 [IU]/h | INTRAVENOUS | Status: DC

## 2019-04-26 MED ORDER — FENTANYL CITRATE (PF) 100 MCG/2ML IJ SOLN: 50.0000 ug | INTRAMUSCULAR | Status: DC | PRN

## 2019-04-26 MED ORDER — LACTATED RINGERS IV SOLN
INTRAVENOUS | Status: DC
  Administered 2019-04-26: 05:00:00 via INTRAVENOUS

## 2019-04-26 MED ORDER — COCONUT OIL OIL
1.0000 "application " | TOPICAL_OIL | Status: DC | PRN
  Administered 2019-04-26: 1 via TOPICAL

## 2019-04-26 MED ORDER — ZOLPIDEM TARTRATE 5 MG PO TABS: 5.0000 mg | ORAL_TABLET | Freq: Every evening | ORAL | Status: DC | PRN

## 2019-04-26 MED ORDER — SIMETHICONE 80 MG PO CHEW: 80.0000 mg | CHEWABLE_TABLET | ORAL | Status: DC | PRN

## 2019-04-26 MED ORDER — DIBUCAINE (PERIANAL) 1 % EX OINT: 1.0000 "application " | TOPICAL_OINTMENT | CUTANEOUS | Status: DC | PRN

## 2019-04-26 MED ORDER — ONDANSETRON HCL 4 MG PO TABS: 4.0000 mg | ORAL_TABLET | ORAL | Status: DC | PRN

## 2019-04-26 MED ORDER — MEDROXYPROGESTERONE ACETATE 150 MG/ML IM SUSP: 150.0000 mg | INTRAMUSCULAR | Status: DC | PRN

## 2019-04-26 MED ORDER — LACTATED RINGERS IV SOLN: 500.0000 mL | INTRAVENOUS | Status: DC | PRN

## 2019-04-26 MED ORDER — IBUPROFEN 600 MG PO TABS
600.0000 mg | ORAL_TABLET | Freq: Four times a day (QID) | ORAL | Status: DC
  Administered 2019-04-26 – 2019-04-28 (×9): 600 mg via ORAL
  Filled 2019-04-26 (×9): qty 1

## 2019-04-26 MED ORDER — OXYTOCIN 10 UNIT/ML IJ SOLN
INTRAMUSCULAR | Status: AC
  Administered 2019-04-26: 10 [IU] via INTRAMUSCULAR
  Filled 2019-04-26: qty 1

## 2019-04-26 MED ORDER — DIPHENHYDRAMINE HCL 25 MG PO CAPS: 25.0000 mg | ORAL_CAPSULE | Freq: Four times a day (QID) | ORAL | Status: DC | PRN

## 2019-04-26 MED ORDER — BENZOCAINE-MENTHOL 20-0.5 % EX AERO: 1.0000 "application " | INHALATION_SPRAY | CUTANEOUS | Status: DC | PRN

## 2019-04-26 MED ORDER — ACETAMINOPHEN 325 MG PO TABS: 650.0000 mg | ORAL_TABLET | ORAL | Status: DC | PRN

## 2019-04-26 MED ORDER — PRENATAL MULTIVITAMIN CH
1.0000 | ORAL_TABLET | Freq: Every day | ORAL | Status: DC
  Administered 2019-04-27 – 2019-04-28 (×2): 1 via ORAL
  Filled 2019-04-26 (×3): qty 1

## 2019-04-26 MED ORDER — SOD CITRATE-CITRIC ACID 500-334 MG/5ML PO SOLN: 30.0000 mL | ORAL | Status: DC | PRN

## 2019-04-26 MED ORDER — OXYTOCIN BOLUS FROM INFUSION: 500.0000 mL | Freq: Once | INTRAVENOUS | Status: DC

## 2019-04-26 MED ORDER — ACETAMINOPHEN 325 MG PO TABS
650.0000 mg | ORAL_TABLET | ORAL | Status: DC | PRN
  Administered 2019-04-26 (×2): 650 mg via ORAL
  Filled 2019-04-26 (×2): qty 2

## 2019-04-26 NOTE — H&P (Signed)
Peggy [name] is a 28 y.o. female, [phone] at 38.6 weeks, presenting for active labor with advanced dilation. Patient receives care at [facility] and was supervised for a Low risk pregnancy. Pregnancy and medical history significant for problems as listed below. She is GBS positive.  She is anticipating a female infant and requests Depo Provera and possible IUD for PP birth control method.    Patient Active Problem List   Diagnosis Date Noted  . Indication for care in labor or delivery 04/26/2019  . History of gestational diabetes 10/28/2018  . Supervision of normal pregnancy 09/23/2018  . Low vitamin D level 02/12/2017  . Smoker 07/25/2014    History of present pregnancy:  Last evaluation:  April 22, 2019 in office with Dr. Toma Deiters  VE: 2/50/-3 Elevated BP with PC Ratio WNL    FAMILY TREE  LAB RESULTS  Language English Pap 02/06/17 neg  Initiated care at 8wk GC/CT Initial: -/-           36wks:-/-  Dating by 1st trimester U/S 6wk    Support person Marcello Fennel NT/IT: neg       AFP:         cfDNA:    [place]/HgbE   Flu vaccine 09/23/18 CF 02/06/17 neg  TDaP vaccine 02/18/19  SMA   Rhogam       Blood Type A/Positive/-- (09/27 0847)  Anatomy US Normal female Antibody Negative (09/27 0847)  Feeding Plan breast HBsAg Negative (09/27 0847)  Contraception ? IUD; Says got pg on Nex, but had it out 2/15 and got pg 3/15! RPR Non Reactive (09/27 0847)  Circumcision Yes at [facility] [place]  2.36 (09/27 0847)  Pediatrician [facility] HIV Non Reactive (09/27 0847)  Prenatal Classes       GTT/A1C Early: 5.0     26-28wks:82/103/105  BTL Consent  GBS  POS  VBAC Consent n/a    Waterbirth [ ] Class [ ] Consent [ ] CNM visit PP Needs       OB History    Gravida  4   Para  3   Term  3   Preterm  0   AB  0   Living  3     SAB  0   TAB  0   Ectopic  0   Multiple  0   Live Births  3             Past Medical History:  Diagnosis Date  . Asthma   . Gestational diabetes   .  Pyloric stenosis    Past Surgical History:  Procedure Laterality Date  . PYLOROMYOTOMY     Family History: family history includes Arthritis in her mother and paternal grandmother; Asthma in her father and paternal grandmother; COPD in her maternal grandfather; Cancer in her mother; Depression in her mother and paternal grandmother; Diabetes in her paternal grandmother; Heart disease in her father and paternal grandmother; Hyperlipidemia in her father and paternal grandmother; Hypertension in her father and paternal grandmother. Social History:  reports that she has been smoking cigarettes. She has a 1.25 pack-year smoking history. She has never used smokeless tobacco. She reports that she does not drink alcohol or use drugs.   Prenatal Transfer Tool  Maternal Diabetes: No Genetic Screening: Normal Maternal Ultrasounds/Referrals: Normal Fetal Ultrasounds or other Referrals:  None Maternal Substance Abuse:  Yes:  Type: Smoker, Marijuana Significant Maternal Medications:  None Significant Maternal Lab Results: Lab values include: Group B Strep positive  Maternal Assessment:  ROS: +Contractions, -LOF, -Vaginal Bleeding, +Fetal Movement  All other systems reviewed and negative.    No Known Allergies   Dilation: Lip/rim Station: 0 Exam by:: Alycia Rossetti, RNC currently breastfeeding.  Physical Exam  Constitutional: She is oriented to person, place, and time. She appears well-developed and well-nourished. She appears distressed.  HENT:  Head: Normocephalic and atraumatic.  Eyes: Conjunctivae are normal.  Neck: Normal range of motion.  Cardiovascular: Normal rate.  Respiratory: Effort normal.  Musculoskeletal: Normal range of motion.        General: No edema.  Neurological: She is alert and oriented to person, place, and time.  Skin: Skin is warm and dry.  Psychiatric: She has a normal mood and affect. Her behavior is normal.    Fetal  Assessment: Leopolds: -Pelvis:Adequate and Proven -Presentation:Vertex  FHR: 135 bpm, Mod Var, -Decels, +Accels UCs:  Palpates moderate to strong    Assessment IUP at 38.6 weeks Active Labor- Imminent Delivery GBS Positive Smoker  Plan: Admit to [facility]  Routine Labor and Delivery Orders per Protocol Anticipate SVD Discussed likelihood that GBS prophylaxis will not be received.   Loann Quill, MSN 04/26/2019, 5:16 AM

## 2019-04-26 NOTE — Discharge Summary (Signed)
Postpartum Discharge Summary     Patient Name: Peggy [name] DOB: Jun 18, 1991 MRN: [id number]  Date of admission: 04/26/2019 Delivering Provider: Gavin Pound   Date of discharge: 04/28/2019  Admitting diagnosis: 69.6WKS CTX Intrauterine pregnancy: [redacted]w[redacted]d     Secondary diagnosis:  Principal Problem:   SVD (spontaneous vaginal delivery) Active Problems:   Smoker   Indication for care in labor or delivery   Group beta Strep positive  Additional problems: None     Discharge diagnosis: Term Pregnancy Delivered and GBS Untreated                                                                                                Post partum procedures: None  Augmentation: AROM  Complications: None  Hospital course:  Onset of Labor With Vaginal Delivery     28 y.o. yo [phone] at [redacted]w[redacted]d was admitted in Active Labor at 9.5cm on 04/26/2019. She progressed to complete and was AROM to assist with pushing efforts.  Patient delivered a viable female infant without incident or lacerations.    Membrane Rupture Time/Date: 5:29 AM ,04/26/2019   Intrapartum Procedures: Episiotomy: None [1]                                         Lacerations:  None [1]  Patient had a delivery of a Viable infant. 04/26/2019  Information for the patient's newborn:  Kamorie, Aldous [[id number]]  Delivery Method: Vaginal, Spontaneous(Filed from Delivery Summary)    Pateint had an uncomplicated postpartum course.  She is ambulating, tolerating a regular diet, passing flatus, and urinating well. Patient is discharged home in stable condition on 04/28/19.   Magnesium Sulfate recieved: No BMZ received: No  Physical exam  Vitals:   04/27/19 1450 04/27/19 2236 04/28/19 0431 04/28/19 0745  BP: 124/79 122/64  116/66  Pulse: 80 78  83  Resp: 18 18  16   Temp: 98.9 F (37.2 C) 97.8 F (36.6 C)  98.1 F (36.7 C)  TempSrc: Oral Oral  Oral  SpO2:  100% 98% 99%   General: alert, cooperative and no  distress Lochia: appropriate Uterine Fundus: firm Incision: N/A DVT Evaluation: No evidence of DVT seen on physical exam. Labs: Lab Results  Component Value Date   WBC 12.5 (H) 04/27/2019   HGB 9.9 (L) 04/27/2019   HCT 30.7 (L) 04/27/2019   MCV 85.5 04/27/2019   PLT 280 04/27/2019   CMP Latest Ref Rng & Units 04/05/2018  Glucose 65 - 99 mg/dL 86  BUN 6 - 20 mg/dL 13  Creatinine 0.44 - 1.00 mg/dL 0.61  Sodium 135 - 145 mmol/L 137  Potassium 3.5 - 5.1 mmol/L 3.4(L)  Chloride 101 - 111 mmol/L 107  CO2 22 - 32 mmol/L 20(L)  Calcium 8.9 - 10.3 mg/dL 8.5(L)  Total Protein 6.5 - 8.1 g/dL -  Total Bilirubin 0.3 - 1.2 mg/dL -  Alkaline Phos 38 - 126 U/L -  AST 15 - 41 U/L -  ALT 14 - 54  U/L -    Discharge instruction: per After Visit Summary and "Baby and Me Booklet".  After visit meds:  Allergies as of 04/28/2019   No Known Allergies     Medication List    STOP taking these medications   Accu-Chek SmartView test strip Generic drug:  glucose blood   terconazole 0.4 % vaginal cream Commonly known as:  TERAZOL 7     TAKE these medications   acetaminophen 325 MG tablet Commonly known as:  TYLENOL Take 650 mg by mouth every 6 (six) hours as needed for mild pain or headache.   albuterol 108 (90 Base) MCG/ACT inhaler Commonly known as:  VENTOLIN HFA Inhale 2 puffs into the lungs every 6 (six) hours as needed for wheezing or shortness of breath.   ibuprofen 600 MG tablet Commonly known as:  ADVIL Take 1 tablet (600 mg total) by mouth every 6 (six) hours.       Diet: routine diet  Activity: Advance as tolerated. Pelvic rest for 6 weeks.   Outpatient follow up:4 weeks Follow up Appt: Future Appointments  Date Time Provider [place]  05/25/2019 11:15 AM Roma Schanz, CNM [facility]   Follow up Visit: Sent   Please schedule this patient for Postpartum visit in: 4 weeks with the following provider: Any provider For C/S patients schedule nurse  incision check in weeks 2 weeks: no Low risk pregnancy complicated by: Smoker Delivery mode:  SVD Anticipated Birth Control:  Depo PP Procedures needed: None  Schedule Integrated [place] visit: no      Newborn Data: Live born female-[name] Birth Weight:  3450g APGAR: 59, 9  Newborn Delivery   Birth date/time:  04/26/2019 05:32:00 Delivery type:  Vaginal, Spontaneous     Baby Feeding: Breast Disposition:home with mother   04/28/2019 Aura Camps, MD

## 2019-04-26 NOTE — Lactation Note (Signed)
This note was copied from a baby's chart. Lactation Consultation Note  Patient Name: Peggy [name] Date: 04/26/2019 Reason for consult: Initial assessment;Early term 40-38.6wks P4  Mom reports she breastfed her last baby for one year.  Newborn has latched.  Mom trying to sleep.  Instructed to feed with cues and call for assist prn.  Breastfeeding consultation services and support information given to patient.  Maternal Data    Feeding    LATCH Score                   Interventions    Lactation Tools Discussed/Used     Consult Status Consult Status: Follow-up Date: 04/27/19 Follow-up type: In-patient    Peggy [name] 04/26/2019, 10:49 AM

## 2019-04-26 NOTE — MAU Note (Signed)
Patient presents in severe pain reporting contractions 5 minutes apart.  No LOF/VB.  Unsure of fetal movement.  Reports no complications in her pregnancy.

## 2019-04-27 LAB — CBC
HCT: 30.7 % — ABNORMAL LOW (ref 36.0–46.0)
Hemoglobin: 9.9 g/dL — ABNORMAL LOW (ref 12.0–15.0)
MCH: 27.6 pg (ref 26.0–34.0)
MCHC: 32.2 g/dL (ref 30.0–36.0)
MCV: 85.5 fL (ref 80.0–100.0)
Platelets: 280 10*3/uL (ref 150–400)
RBC: 3.59 MIL/uL — ABNORMAL LOW (ref 3.87–5.11)
RDW: 14 % (ref 11.5–15.5)
WBC: 12.5 10*3/uL — ABNORMAL HIGH (ref 4.0–10.5)
nRBC: 0 % (ref 0.0–0.2)

## 2019-04-27 LAB — ABO/RH: ABO/RH(D): A POS

## 2019-04-27 NOTE — Progress Notes (Addendum)
Post Partum Day 1 Subjective: no complaints, up ad lib, voiding and tolerating PO. C/O nipple pain. Baby is very vigorous nurser.    Objective: Blood pressure (!) 102/53, pulse 74, temperature 98.2 F (36.8 C), temperature source Oral, resp. rate 16, SpO2 98 %, unknown if currently breastfeeding.  Physical Exam:    General: alert, cooperative, appears stated age and no distress Lochia: appropriate Uterine Fundus: firm Incision: NA DVT Evaluation: No evidence of DVT seen on physical exam.  Baby appears to be latched properly.   Recent Labs    04/26/19 0603 04/27/19 0531  HGB 11.6* 9.9*  HCT 35.5* 30.7*    Assessment/Plan: Plan for discharge tomorrow and Breastfeeding  Depo prior to D/C LC Comfort measures.    LOS: 1 day   [place] 04/27/2019, 9:57 AM

## 2019-04-27 NOTE — Lactation Note (Signed)
This note was copied from a baby's chart. Lactation Consultation Note  Patient Name: Peggy [name] Date: 04/27/2019 Reason for consult: Follow-up assessment;Nipple pain/trauma Randel Books is 81 hours old.  Mom has given formula twice due to sore nipples.  She just finished latching baby to one breast and pumped 25 mls from opposite breast.  Reviewed milk storage.  She will offer expressed milk to baby with a slow flow nipple.  Encouraged to call for latch assit prn.  Maternal Data    Feeding Nipple Type: Slow - flow  LATCH Score Latch: Grasps breast easily, tongue down, lips flanged, rhythmical sucking.  Audible Swallowing: None  Type of Nipple: Everted at rest and after stimulation  Comfort (Breast/Nipple): Filling, red/small blisters or bruises, mild/mod discomfort  Hold (Positioning): No assistance needed to correctly position infant at breast.  LATCH Score: 7  Interventions    Lactation Tools Discussed/Used     Consult Status Consult Status: Follow-up Date: 04/28/19 Follow-up type: In-patient    Ave Filter 04/27/2019, 2:42 PM

## 2019-04-27 NOTE — Clinical Social Work Maternal (Addendum)
CLINICAL SOCIAL WORK MATERNAL/CHILD NOTE  Patient Details  Name: Peggy [name] MRN: [id number] Date of Birth: February 01, 1991  Date:  04/27/2019  Clinical Social Worker Initiating Note:  Peggy [name], Peggy [name]  Date/Time: Initiated:  04/27/19/0850     Child's Name:  Peggy [name] (spelling TBD)    Biological Parents:  Mother, Father(Peggy [name] (Peggy [name]) , Peggy [name] (Peggy [name]) )   Need for Interpreter:  None   Reason for Referral:  Current Substance Use/Substance Use During Pregnancy    Address:  [address] [place], [place], [address]    Phone number:  [phone] (home)     Additional phone number: n/a  Household Members/Support Persons (HM/SP):   Household Member/Support Person 3, Household Member/Support Person 2   HM/SP Name Relationship DOB or Age  HM/SP -1   [name] (Sibling)       HM/SP -2 [name] (Peggy [name])     HM/SP -3 Peggy [name] (Sibling)       HM/SP -4   Peggy [name] (Sibling)       HM/SP -5        HM/SP -6        HM/SP -7        HM/SP -8          Natural Supports (not living in the home):  Extended Family   Professional Supports: None   Employment: Full-time(works at [name]. )   Type of Work:  Hotel manager    Education:  [name] arranged:  n/a  Museum/gallery curator Resources:  Medicaid   Other Resources:  Physicist, medical , [place] Considerations Which May Impact Care:  none reported   Strengths:  Ability to meet basic needs , Compliance with medical plan , Home prepared for child , Pediatrician chosen   Psychotropic Medications:         Pediatrician:    [place]([facility])  Pediatrician List:   [place] Other([facility])  [facility]      Pediatrician Fax Number:    Risk Factors/Current Problems:  None   Cognitive State:  Alert , Able to Concentrate , Insightful     Mood/Affect:  Comfortable , Calm    Peggy [name] Assessment: Peggy [name] consulted as Peggy [name] reported THC use during pregnancy. Peggy [name] spoke with Peggy [name] at bedside to discuss this further.   Peggy [name] enetred room where Peggy [name] observed that Peggy [name] was lying in bed holding infant. Peggy [name] appeared to be feeding infant however infant was asleep. Peggy [name] congratulated Peggy [name] and asked for infants name. Peggy [name] as advised that Peggy [name] and Peggy [name] can not come to an agreement on how to spell infants name at this time. Per Peggy [name], Peggy [name] is interested in spelling infants name differently than Peggy [name]. Peggy [name] was advised that infants name would be "Peggy [name]" however spelling of that is not determined at this time. Peggy [name] agreeable and suggested that once spelling of name has been determined then Peggy [name] should update Peggy [name] of this. Peggy [name] agreeable. Peggy [name] explained other reason for the visit as well as introduced role. Peggy [name] appeared to be interested in answering questions but also tired as Peggy [name] began to yawn during assessment.  Peggy [name] explained to Peggy [name] hospital drug screen policy. Peggy [name] suggested that her last use for Peggy [name] was in October 2019, and reports no further use after this date. Peggy [name] advised Peggy [name] that infants UDS came back negative however Peggy [name] expressed  to Peggy [name] that we would await CDS results to determine the need for CPSD report. Peggy [name] agreeable and suggested no further questions regarding drug screen policy. Peggy [name] also reported that she doesn't have a history of mental health diagnosis, therefore denies having SI, HI, or being involved in a DV relationship.  Peggy [name] was advised that Peggy [name] has three older daughters, Peggy [name] age 83, Peggy [name] age 95, and Peggy [name] age 27. Peggy [name] expressed that all three daughters are excited about the birth of infant however younger daughter  appears to be struggling as she no longer will be the baby. Peggy [name] was notified that Peggy [name], is at home with other children. Peggy [name] was later advised that only Peggy [name] and three daughters live in the home. Peggy [name] sought clarity on if Peggy [name]  is staying with Peggy [name] and Peggy [name] reported "NO". Peggy [name] expressed understanding. Peggy [name] informed Peggy [name] that she is currently working at [name] in [name]. Peggy [name] expressed that she has support from her mother, dad, and siblings. Aside from these support, Peggy [name] denies having any further supports.   Peggy [name] was advised that Peggy [name] already gets FS/WIC as well as medicaid. Peggy [name] suggested Peggy [name] to call worker and get infant added to Medicaid at this time. Peggy [name] was advised that Peggy [name] doesn't have any transportation barriers and has all needed items to care for infant at this time. Carseat is new and infant will sleep in co-sleeper in Peggy [name]'s room. Peggy [name] has chosen pediatrician at [name] also.  Peggy [name] discussed with Peggy [name] Peggy [name] as well as Peggy [name]. Peggy [name] was advised that Peggy [name] hasnt suffered from Peggy [name] in the past. Peggy [name] provided Peggy [name] with Post partum Progress Checklist as well as Safe Sleep Habits information. Peggy [name] offered other resources to Peggy [name] and Peggy [name] declined needing any other resources at this time.   Peggy [name] Plan/Description:  Peggy [name] Will Continue to Monitor Umbilical Cord Tissue Drug Screen Results and Make Report if Warranted, Other Patient/Family Education, [place], Sudden Infant Death Syndrome (Peggy [name]) Education, No Further Intervention Required/No Barriers to Discharge    Wetzel Bjornstad, [place] 04/27/2019, 10:39 AM

## 2019-04-28 MED ORDER — IBUPROFEN 600 MG PO TABS: 600.0000 mg | ORAL_TABLET | Freq: Four times a day (QID) | ORAL | 0 refills | Status: DC

## 2019-04-28 MED ORDER — ALBUTEROL SULFATE (2.5 MG/3ML) 0.083% IN NEBU
2.5000 mg | INHALATION_SOLUTION | Freq: Four times a day (QID) | RESPIRATORY_TRACT | Status: DC | PRN
  Administered 2019-04-28: 2.5 mg via RESPIRATORY_TRACT
  Filled 2019-04-28: qty 3

## 2019-04-28 NOTE — Lactation Note (Signed)
This note was copied from a baby's chart. Lactation Consultation Note  Patient Name: Peggy [name] ENIDP'O Date: 04/28/2019   Infant is at 28 hrs old. Mom-baby dyad are preparing for discharge. Vernie Shanks, RN asked Mom if she needed to see lactation before she left; Mom declined. Infant has gained 29g over the last 24 hours.   Matthias Hughs [facility] 04/28/2019, 10:58 AM

## 2019-05-03 ENCOUNTER — Telehealth: Payer: Self-pay | Admitting: Obstetrics & Gynecology

## 2019-05-03 MED ORDER — CEPHALEXIN 500 MG PO CAPS: 500.0000 mg | ORAL_CAPSULE | Freq: Three times a day (TID) | ORAL | 0 refills | Status: AC

## 2019-05-03 NOTE — Telephone Encounter (Signed)
Pt calling and states that she believes she has mastitis and wanting to see if medication can be sent in.

## 2019-05-03 NOTE — Telephone Encounter (Signed)
Pt has redness and breast tenderness in one area. Only on left breast. Has temp of 99.3. nkda.   Discussed patient with Dr. Rip Harbour. Rx for Keflex sent in.

## 2019-05-21 ENCOUNTER — Encounter: Payer: Self-pay | Admitting: *Deleted

## 2019-05-25 ENCOUNTER — Encounter: Payer: Self-pay | Admitting: Adult Health

## 2019-05-25 ENCOUNTER — Other Ambulatory Visit: Payer: Self-pay

## 2019-05-25 ENCOUNTER — Ambulatory Visit (INDEPENDENT_AMBULATORY_CARE_PROVIDER_SITE_OTHER): Payer: Medicaid Other | Admitting: Adult Health

## 2019-05-25 DIAGNOSIS — Z1331 Encounter for screening for depression: Secondary | ICD-10-CM

## 2019-05-25 DIAGNOSIS — Z30014 Encounter for initial prescription of intrauterine contraceptive device: Secondary | ICD-10-CM

## 2019-05-25 NOTE — Progress Notes (Signed)
Patient ID: Peggy [name], female   DOB: [birth year], 28 y.o.   MRN: [id number] Peggy [name] is a 28 year old white female, G4P4, separated, in for a postpartum exam at 4+1 weeks.  She delivered at 38+6 weeks, on 04/26/2019 by Georgie Chard CNM and was discharged home on 04/28/2019.  Delivery Date: 04/26/2019  Method of Delivery: SVD baby boy, [name]  Sexual Activity since delivery: No  Method of Feeding: Breast  Number of weeks bleeding post delivery: 3.5 weeks   Review of Systems: Patient denies any  hearing loss, fatigue, blurred vision, shortness of breath, chest pain, abdominal pain, problems with bowel movements, urination, or intercourse(no sex yet). No joint pain or mood swings.Has headache at times, from not enough sleep.  Reviewed past medical,surgical, social and family history. Reviewed medications and allergies.   Depression Score: 0  BP 123/84 (BP Location: Left Arm, Patient Position: Sitting, Cuff Size: Normal)   Pulse 82   Temp 98.9 F (37.2 C)   Ht 5' (1.524 m)   Wt 138 lb (62.6 kg)   Breastfeeding Yes   BMI 26.95 kg/m   Skin warm and dry. Neck: mid line trachea, normal thyroid, good ROM, no lymphadenopathy noted. Lungs: clear to ausculation bilaterally. Cardiovascular: regular rate and rhythm. Pelvic Exam:   External genitalia is normal in appearance, no lesions.  The vagina has good color, moisture and rugae, no lesions.Urethra has no masses or tenderness noted. The cervix is bulbous.  Uterus is felt to be normal size, shape, and contour, well involuted.  No adnexal masses or tenderness noted.Bladder is non tender, no masses felt. Examination chaperoned by Levy Pupa LPN.  Discussed IUD vs nexplanon which she has had, in the past.Liletta is good for 6 years and she wants that one, will get in ASAP for IUD insertion, she is aware to not have sex.   Impression and Plan: 1. Encounter for postpartum visit   2. Depression screening   3. Encounter for  initial prescription of intrauterine contraceptive device (IUD) -No sex -return in 4 days for IUD insertion with Dr Glo Herring.  -review handout on liletta

## 2019-05-27 ENCOUNTER — Encounter: Payer: Self-pay | Admitting: *Deleted

## 2019-05-28 ENCOUNTER — Other Ambulatory Visit: Payer: Self-pay

## 2019-05-28 ENCOUNTER — Ambulatory Visit (INDEPENDENT_AMBULATORY_CARE_PROVIDER_SITE_OTHER): Payer: Medicaid Other | Admitting: Obstetrics and Gynecology

## 2019-05-28 ENCOUNTER — Encounter: Payer: Self-pay | Admitting: Obstetrics and Gynecology

## 2019-05-28 VITALS — BP 119/79 | HR 83 | Ht 60.0 in | Wt 138.0 lb

## 2019-05-28 DIAGNOSIS — Z30430 Encounter for insertion of intrauterine contraceptive device: Secondary | ICD-10-CM | POA: Diagnosis not present

## 2019-05-28 DIAGNOSIS — Z3202 Encounter for pregnancy test, result negative: Secondary | ICD-10-CM

## 2019-05-28 LAB — POCT URINE PREGNANCY: Preg Test, Ur: NEGATIVE

## 2019-05-28 MED ORDER — LEVONORGESTREL 19.5 MCG/DAY IU IUD
INTRAUTERINE_SYSTEM | Freq: Once | INTRAUTERINE | Status: AC
  Administered 2019-05-28: 1 via INTRAUTERINE

## 2019-05-28 NOTE — Progress Notes (Signed)
Patient ID: Peggy [name], female   DOB: Jun 04, 1991, 28 y.o.   MRN: [id number]   GYNECOLOGY CLINIC PROCEDURE NOTE  Peggy [name] is a 28 y.o. [phone] here for [place] IUD insertion.  No GYN concerns.  Last pap smear was on 02/06/17 and was normal.  IUD Insertion Procedure Note Patient identified, informed consent performed, consent signed.   Discussed risks of irregular bleeding, cramping, infection, malpositioning or misplacement of the IUD outside the uterus which may require further procedure such as laparoscopy. Time out was performed.  Urine pregnancy test negative.  Speculum placed in the vagina.  Cervix visualized.  Cleaned with Betadine x 2.  Grasped anteriorly with a single tooth tenaculum.   Uterus sounded to 8 cm.   IUD placed per manufacturer's recommendations.  Strings trimmed to 3 cm. Tenaculum was removed, good hemostasis noted.  Patient tolerated procedure well.  Transvaginal Ultrasound was used to confirm IUD position, located well up in endometrial cavity  Patient was given post-procedure instructions.  She was advised to have backup contraception for one week.  Patient was also asked to check IUD strings periodically.  Follow up in 4 weeks for IUD check.and string trim if needed.   By signing my name below, I, Samul Dada, attest that this documentation has been prepared under the direction and in the presence of Jonnie Kind, MD. Electronically Signed: [place]. 05/28/19. 10:12 AM.  I personally performed the services described in this documentation, which was SCRIBED in my presence. The recorded information has been reviewed and considered accurate. It has been edited as necessary during review. Jonnie Kind, MD

## 2019-06-08 ENCOUNTER — Telehealth: Payer: Self-pay | Admitting: Adult Health

## 2019-06-08 NOTE — Telephone Encounter (Signed)
Letter done to [facility] 06/14/2019

## 2019-06-24 ENCOUNTER — Encounter: Payer: Self-pay | Admitting: Obstetrics and Gynecology

## 2019-06-24 ENCOUNTER — Other Ambulatory Visit: Payer: Self-pay

## 2019-06-24 ENCOUNTER — Ambulatory Visit (INDEPENDENT_AMBULATORY_CARE_PROVIDER_SITE_OTHER): Payer: Medicaid Other | Admitting: Obstetrics and Gynecology

## 2019-06-24 VITALS — BP 129/84 | HR 95 | Ht 61.0 in | Wt 136.8 lb

## 2019-06-24 DIAGNOSIS — Z30431 Encounter for routine checking of intrauterine contraceptive device: Secondary | ICD-10-CM | POA: Diagnosis not present

## 2019-06-24 NOTE — Progress Notes (Signed)
   [facility] Visit  @DATE @            Patient name: Peggy [name] MRN [id number]  Date of birth: January 06, 1991  CC & HPI:  Peggy [name] is a 28 y.o. female presenting today for IUD check.  She is postpartum, had placement of the IUD is not sexually active has had bleeding since delivery but it is finally eased off.  No cramping or discomfort.  ROS:  ROS Patient reports that she got pregnant the first time unprotected second time as a Nexplanon failure third and fourth time as contraceptive failure while using NuvaRing.  Currently using IUD and desires no future pregnancy. Child support issues are a difficulty Pertinent History Reviewed:   Reviewed: Significant for no bleeding or GYN complaints Medical         Past Medical History:  Diagnosis Date  . Asthma   . Gestational diabetes   . Pyloric stenosis                               Surgical Hx:    Past Surgical History:  Procedure Laterality Date  . PYLOROMYOTOMY     Medications: Reviewed & Updated - see associated section                       Current Outpatient Medications:  .  acetaminophen (TYLENOL) 325 MG tablet, Take 650 mg by mouth every 6 (six) hours as needed for mild pain or headache., Disp: , Rfl:  .  albuterol (PROVENTIL HFA;VENTOLIN HFA) 108 (90 Base) MCG/ACT inhaler, Inhale 2 puffs into the lungs every 6 (six) hours as needed for wheezing or shortness of breath., Disp: 1 Inhaler, Rfl: 2 .  ibuprofen (ADVIL) 600 MG tablet, Take 1 tablet (600 mg total) by mouth every 6 (six) hours. (Patient taking differently: Take 600 mg by mouth as needed. ), Disp: 30 tablet, Rfl: 0 .  Levonorgestrel (KYLEENA) 19.5 MG IUD, by Intrauterine route., Disp: , Rfl:    Social History: Reviewed -  reports that she has been smoking cigarettes. She has a 1.25 pack-year smoking history. She has never used smokeless tobacco.  Objective Findings:  Vitals: Blood pressure 129/84, pulse 95, height 5\' 1"  (1.549 m), weight 136 lb  12.8 oz (62.1 kg), currently breastfeeding.  PHYSICAL EXAMINATION General appearance - alert, well appearing, and in no distress, oriented to person, place, and time and normal appearing weight Mental status - alert, oriented to person, place, and time, normal mood, behavior, speech, dress, motor activity, and thought processes Chest -unlabored breathing Heart -  Abdomen - soft, nontender, nondistended, no masses or organomegaly Breasts -  Skin - normal coloration and turgor, no rashes, no suspicious skin lesions noted  PELVIC External genitalia -normal Vulva -no abnormality Vagina -physiologic discharge Cervix -normal appearing cervix multiparous everted, IUD string 5 cm in length trimmed to 2-1/2 cm  Uterus -nontender  Adnexa -no bimanual [place] -not done Rectal - rectal exam not indicated    Assessment & Plan:   A:  1. IUD check, string trimmed  P:  1. Follow-up 3 years or PRN

## 2019-06-25 IMAGING — DX DG CHEST 2V
2 series · 2 of 2 positions shown · non-contrast
Comparison: 10/22/2018

CLINICAL DATA: SOB AND COLD SYMPTOMS FOR 4 DAYS AND CHEST PAIN THIS
AM. PATIENT IS 30 WEEKS PREGNANT.

EXAM:
CHEST - 2 VIEW

[chest pa]
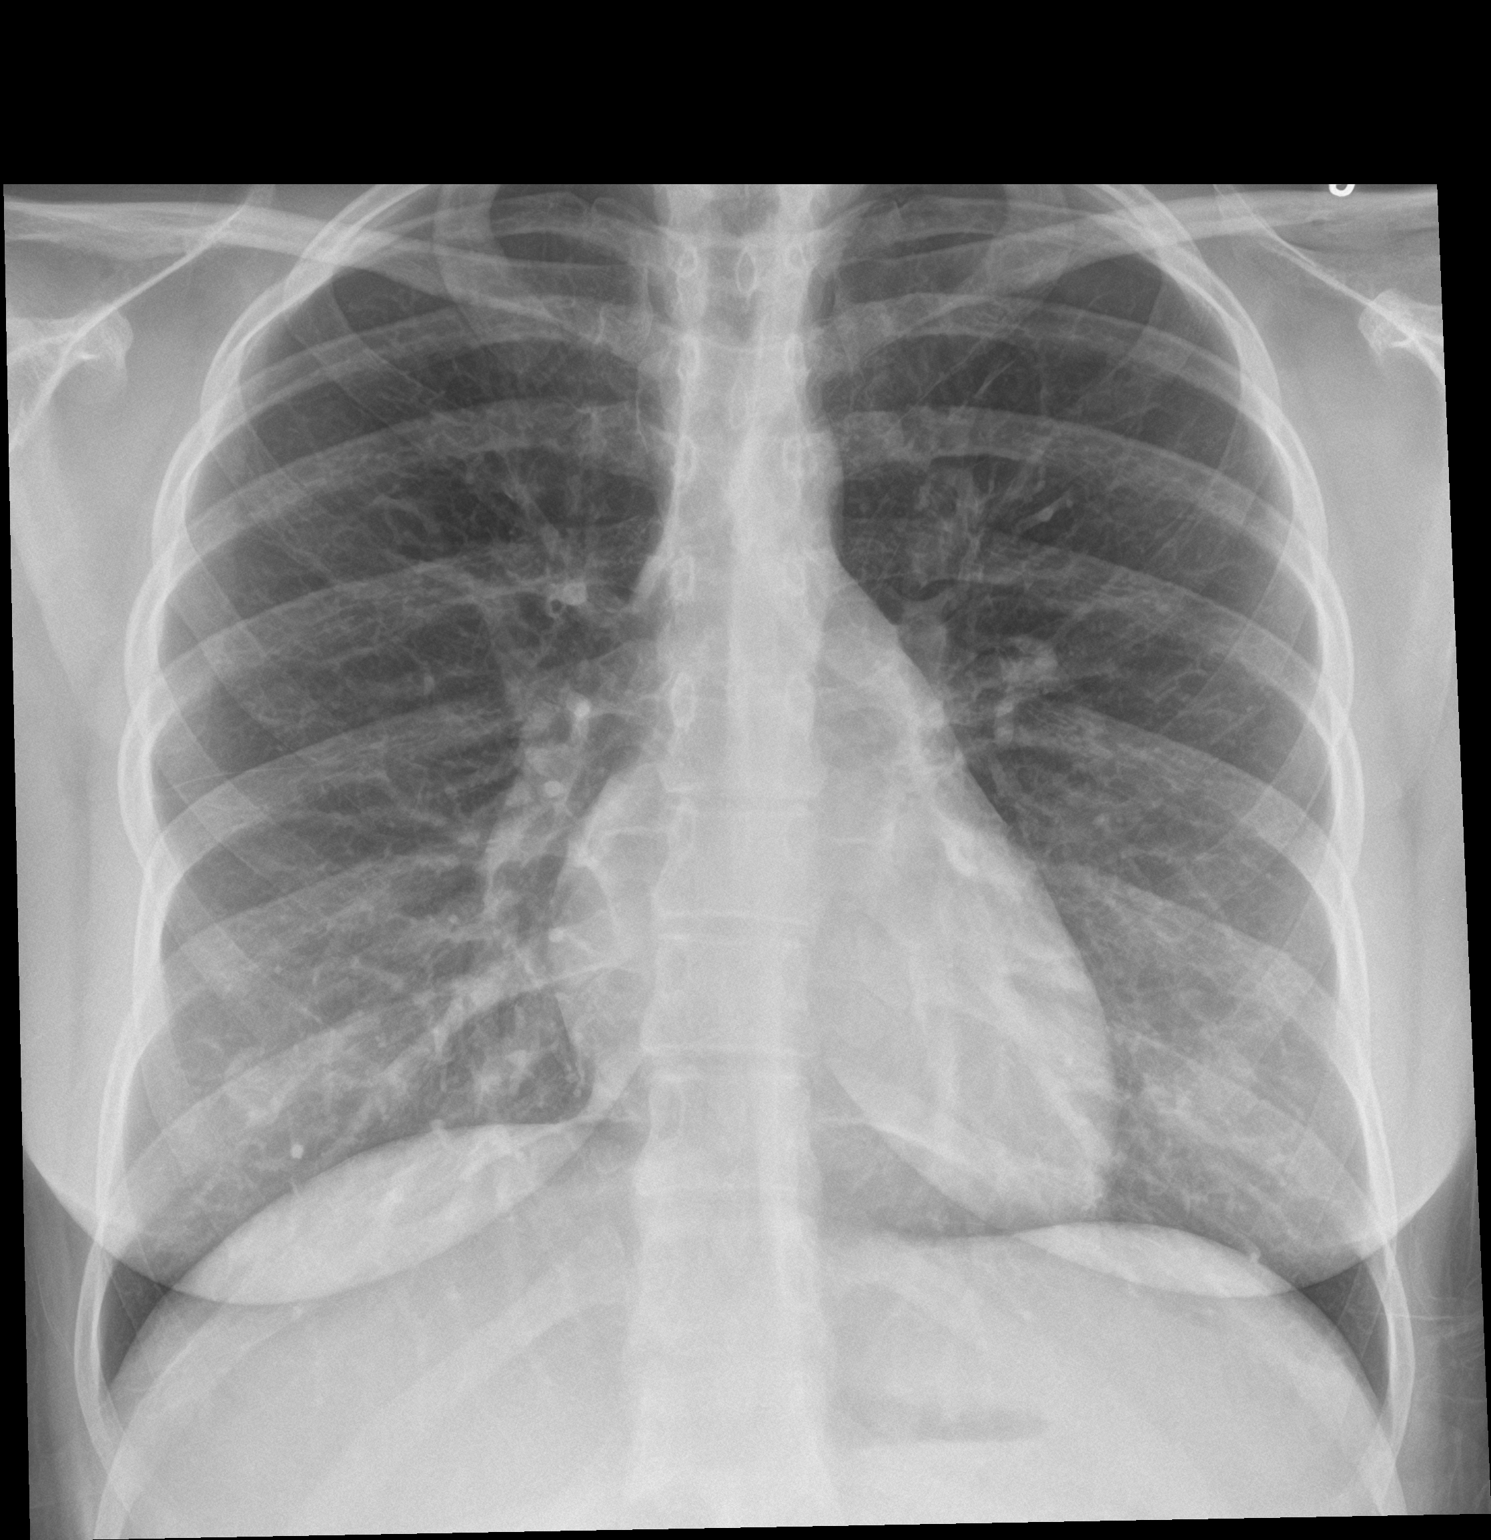

[chest lat]
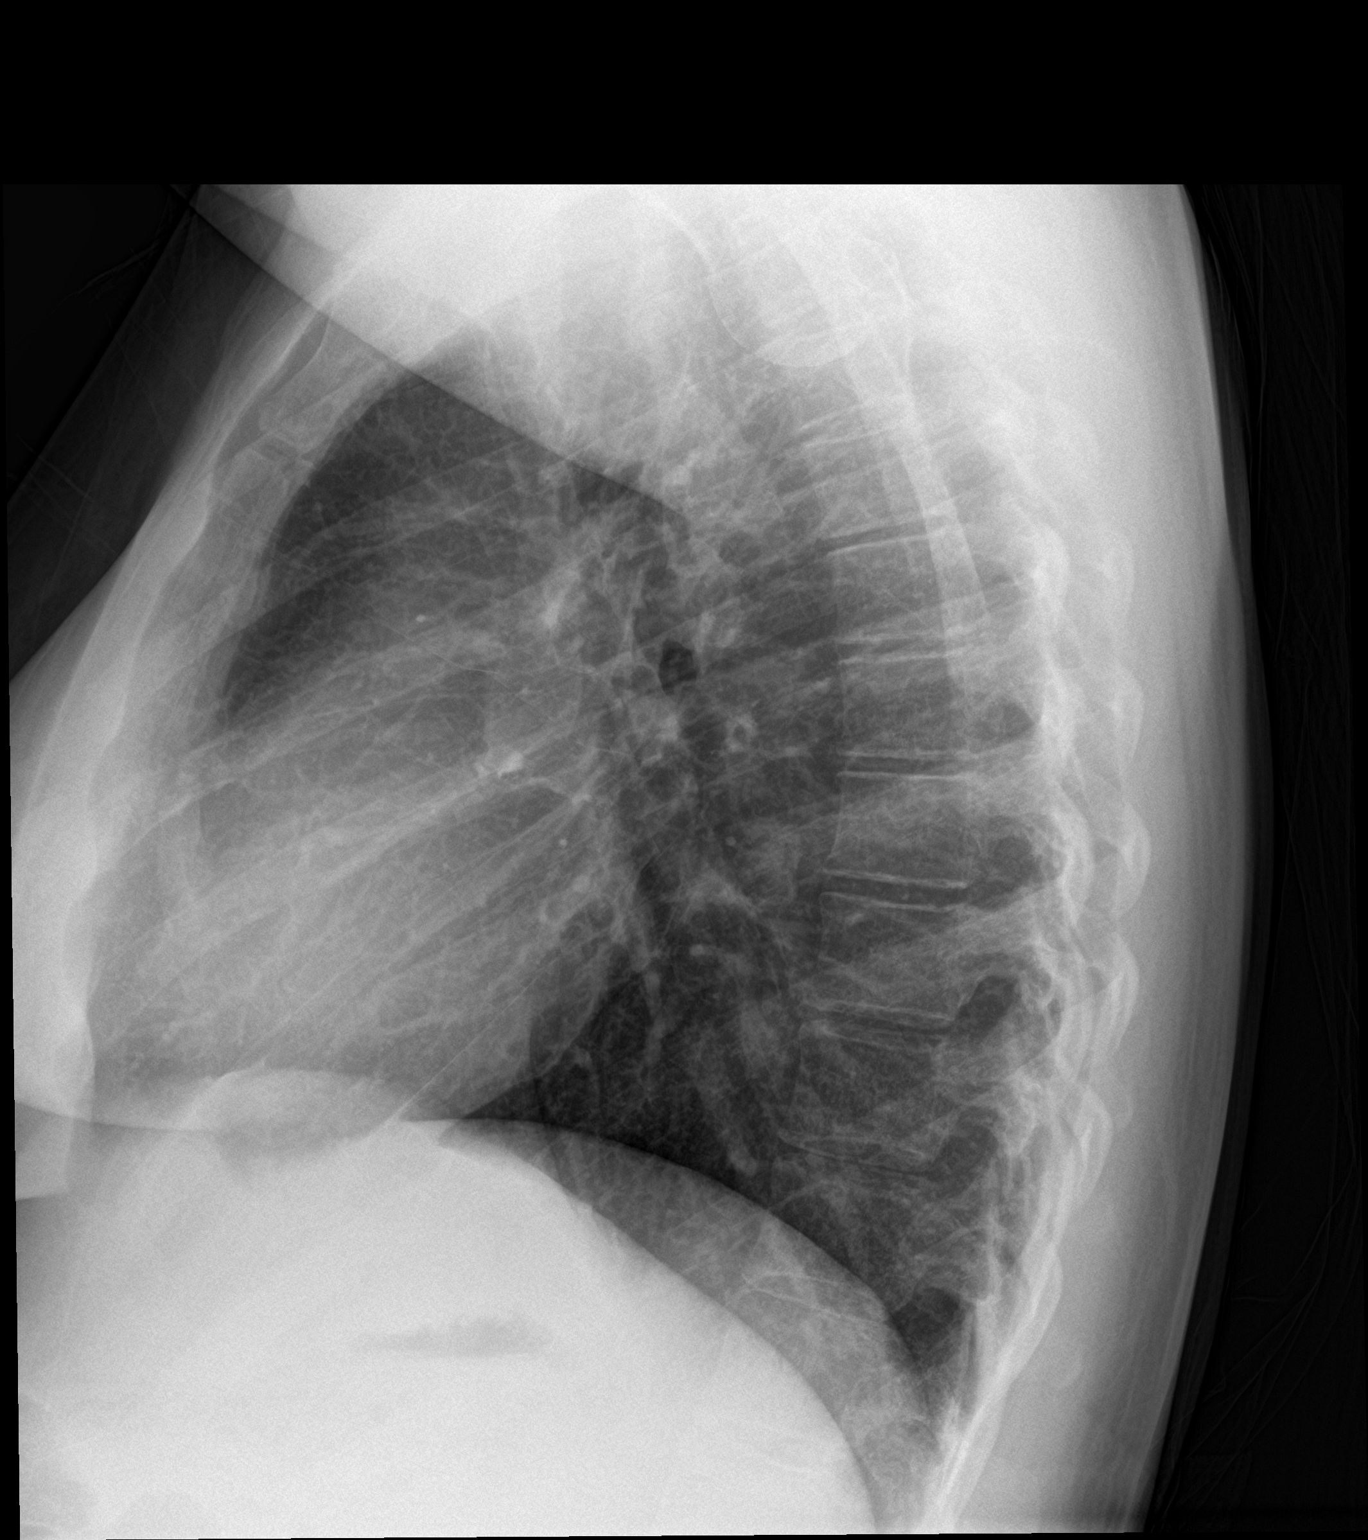

[2 of 2 positions shown; findings below may reference images not displayed]

FINDINGS: Lungs are clear.

Heart size and mediastinal contours are within normal limits.

No effusion.

Visualized bones unremarkable.
IMPRESSION: No acute cardiopulmonary disease.

## 2019-08-16 ENCOUNTER — Encounter (HOSPITAL_COMMUNITY): Payer: Self-pay | Admitting: Emergency Medicine

## 2019-08-16 ENCOUNTER — Emergency Department (HOSPITAL_COMMUNITY): Payer: Medicaid Other

## 2019-08-16 ENCOUNTER — Emergency Department (HOSPITAL_COMMUNITY)
Admission: EM | Admit: 2019-08-16 | Discharge: 2019-08-17 | Disposition: A | Discharge status: Home / Self Care | Payer: Medicaid Other | Source: Home / Self Care | Attending: Emergency Medicine | Admitting: Emergency Medicine

## 2019-08-16 ENCOUNTER — Other Ambulatory Visit: Payer: Self-pay

## 2019-08-16 ENCOUNTER — Emergency Department (HOSPITAL_COMMUNITY)
Admission: EM | Admit: 2019-08-16 | Discharge: 2019-08-16 | Discharge status: Against Medical Advice | Payer: Medicaid Other | Attending: Emergency Medicine | Admitting: Emergency Medicine

## 2019-08-16 DIAGNOSIS — Z975 Presence of (intrauterine) contraceptive device: Secondary | ICD-10-CM | POA: Insufficient documentation

## 2019-08-16 DIAGNOSIS — F17210 Nicotine dependence, cigarettes, uncomplicated: Secondary | ICD-10-CM | POA: Insufficient documentation

## 2019-08-16 DIAGNOSIS — R509 Fever, unspecified: Secondary | ICD-10-CM | POA: Diagnosis not present

## 2019-08-16 DIAGNOSIS — J45909 Unspecified asthma, uncomplicated: Secondary | ICD-10-CM | POA: Diagnosis not present

## 2019-08-16 DIAGNOSIS — J189 Pneumonia, unspecified organism: Secondary | ICD-10-CM

## 2019-08-16 DIAGNOSIS — R05 Cough: Secondary | ICD-10-CM | POA: Diagnosis not present

## 2019-08-16 DIAGNOSIS — J4541 Moderate persistent asthma with (acute) exacerbation: Secondary | ICD-10-CM | POA: Insufficient documentation

## 2019-08-16 DIAGNOSIS — Z20828 Contact with and (suspected) exposure to other viral communicable diseases: Secondary | ICD-10-CM | POA: Insufficient documentation

## 2019-08-16 LAB — GROUP A STREP BY PCR: Group A Strep by PCR: NOT DETECTED

## 2019-08-16 MED ORDER — AMOXICILLIN 500 MG PO CAPS: 1000.0000 mg | ORAL_CAPSULE | Freq: Three times a day (TID) | ORAL | 0 refills | Status: DC

## 2019-08-16 MED ORDER — ALBUTEROL SULFATE (2.5 MG/3ML) 0.083% IN NEBU: 2.5000 mg | INHALATION_SOLUTION | Freq: Four times a day (QID) | RESPIRATORY_TRACT | 0 refills | Status: DC | PRN

## 2019-08-16 MED ORDER — PREDNISONE 20 MG PO TABS: 40.0000 mg | ORAL_TABLET | Freq: Every day | ORAL | 0 refills | Status: DC

## 2019-08-16 MED ORDER — AZITHROMYCIN 250 MG PO TABS
500.0000 mg | ORAL_TABLET | Freq: Once | ORAL | Status: AC
  Administered 2019-08-16: 500 mg via ORAL
  Filled 2019-08-16: qty 2

## 2019-08-16 MED ORDER — PREDNISONE 20 MG PO TABS
40.0000 mg | ORAL_TABLET | Freq: Once | ORAL | Status: AC
  Administered 2019-08-16: 40 mg via ORAL
  Filled 2019-08-16: qty 2

## 2019-08-16 MED ORDER — SODIUM CHLORIDE 0.9 % IV BOLUS
1000.0000 mL | Freq: Once | INTRAVENOUS | Status: AC
  Administered 2019-08-17: 1000 mL via INTRAVENOUS

## 2019-08-16 MED ORDER — AZITHROMYCIN 250 MG PO TABS: ORAL_TABLET | ORAL | 0 refills | Status: DC

## 2019-08-16 MED ORDER — ALBUTEROL SULFATE HFA 108 (90 BASE) MCG/ACT IN AERS
6.0000 | INHALATION_SPRAY | Freq: Once | RESPIRATORY_TRACT | Status: AC
  Administered 2019-08-16: 6 via RESPIRATORY_TRACT
  Filled 2019-08-16: qty 6.7

## 2019-08-16 MED ORDER — AMOXICILLIN 250 MG PO CAPS
1000.0000 mg | ORAL_CAPSULE | Freq: Once | ORAL | Status: AC
  Administered 2019-08-16: 1000 mg via ORAL
  Filled 2019-08-16: qty 4

## 2019-08-16 MED ORDER — ALBUTEROL SULFATE HFA 108 (90 BASE) MCG/ACT IN AERS
4.0000 | INHALATION_SPRAY | Freq: Once | RESPIRATORY_TRACT | Status: AC
  Administered 2019-08-17: 4 via RESPIRATORY_TRACT
  Filled 2019-08-16: qty 6.7

## 2019-08-16 NOTE — ED Provider Notes (Signed)
[facility] EMERGENCY DEPARTMENT Provider Note   CSN: [id number] Arrival date & time: 08/16/19  1710     History   Chief Complaint Chief Complaint  Patient presents with  . Fever  . Cough    HPI Peggy VANDENHEUVEL is a 28 y.o. female.     HPI   Peggy [name] is a 28 y.o. female past medical history of asthma, who presents to the Emergency Department complaining of low-grade fever, nasal congestion, cough, wheezing, and frontal headache.  Symptoms have been present since Friday.  Max temp at home has been 99.8.  She reports occasional cough that is mostly nonproductive.  Rhinorrhea has been clear.  She endorses some shortness of breath associated with cough and exertion.  Mild sore throat also reported.  She states that she gets pneumonia frequently.  She also states that she has ran out of her albuterol recently.  She states that she has mostly been isolating at home denies known COVID exposures.  She is currently lactating.  She denies nausea vomiting, diarrhea, hemoptysis, neck pain or stiffness no loss of taste or smell    Past Medical History:  Diagnosis Date  . Asthma   . Gestational diabetes   . Pyloric stenosis     Patient Active Problem List   Diagnosis Date Noted  . Encounter for initial prescription of intrauterine contraceptive device (IUD) 05/25/2019  . Depression screening 05/25/2019  . Encounter for postpartum visit 05/25/2019  . Indication for care in labor or delivery 04/26/2019  . Group beta Strep positive 04/26/2019  . SVD (spontaneous vaginal delivery) 04/26/2019  . History of gestational diabetes 10/28/2018  . Low vitamin D level 02/12/2017  . Smoker 07/25/2014    Past Surgical History:  Procedure Laterality Date  . PYLOROMYOTOMY       OB History    Gravida  4   Para  4   Term  4   Preterm  0   AB  0   Living  4     SAB  0   TAB  0   Ectopic  0   Multiple  0   Live Births  4            Home Medications     Prior to Admission medications   Medication Sig Start Date End Date Taking? Authorizing Provider  acetaminophen (TYLENOL) 325 MG tablet Take 650 mg by mouth every 6 (six) hours as needed for mild pain or headache.   Yes [provider]  albuterol (PROVENTIL HFA;VENTOLIN HFA) 108 (90 Base) MCG/ACT inhaler Inhale 2 puffs into the lungs every 6 (six) hours as needed for wheezing or shortness of breath. 01/13/19  Yes Cresenzo-Dishmon, Joaquim Lai, CNM  Levonorgestrel (KYLEENA) 19.5 MG IUD by Intrauterine route.   Yes [provider]    Family History Family History  Problem Relation Age of Onset  . Arthritis Mother   . Cancer Mother        thyroid  . Depression Mother   . Hyperlipidemia Father   . Asthma Father   . Hypertension Father   . Heart disease Father   . COPD Maternal Grandfather   . Diabetes Paternal Grandmother   . Arthritis Paternal Grandmother   . Asthma Paternal Grandmother   . Heart disease Paternal Grandmother   . Depression Paternal Grandmother   . Hypertension Paternal Grandmother   . Hyperlipidemia Paternal Grandmother   . Other Neg Hx     Social History Social History  Tobacco Use  . Smoking status: Current Every Day Smoker    Packs/day: 0.25    Years: 5.00    Pack years: 1.25    Types: Cigarettes  . Smokeless tobacco: Never Used  . Tobacco comment: 2 cig/day  Substance Use Topics  . Alcohol use: No  . Drug use: No     Allergies   Patient has no known allergies.   Review of Systems Review of Systems  Constitutional: Positive for fever. Negative for activity change, appetite change and chills.  HENT: Positive for congestion, rhinorrhea and sore throat. Negative for ear pain, facial swelling, trouble swallowing and voice change.   Eyes: Negative for pain and visual disturbance.  Respiratory: Positive for cough, shortness of breath and wheezing.   Cardiovascular: Negative for chest pain.  Gastrointestinal: Negative for abdominal  pain, diarrhea, nausea and vomiting.  Genitourinary: Negative for dysuria.  Musculoskeletal: Negative for arthralgias, neck pain and neck stiffness.  Skin: Negative for color change and rash.  Neurological: Positive for headaches. Negative for dizziness, syncope, facial asymmetry, speech difficulty, weakness and numbness.  Hematological: Negative for adenopathy.     Physical Exam Updated Vital Signs BP 119/76   Pulse (!) 104   Temp 98.5 F (36.9 C)   Resp 19   Ht 5' (1.524 m)   Wt 57.2 kg   SpO2 96%   BMI 24.61 kg/m   Physical Exam Vitals signs and nursing note reviewed.  Constitutional:      Appearance: Normal appearance. She is not ill-appearing or toxic-appearing.  HENT:     Right Ear: Tympanic membrane normal.     Left Ear: Tympanic membrane and ear canal normal.     Mouth/Throat:     Mouth: Mucous membranes are moist.     Pharynx: Oropharynx is clear. Uvula midline. No oropharyngeal exudate, posterior oropharyngeal erythema or uvula swelling.     Tonsils: No tonsillar exudate or tonsillar abscesses.  Neck:     Musculoskeletal: Normal range of motion. No muscular tenderness.     Meningeal: Kernig's sign absent.  Cardiovascular:     Rate and Rhythm: Normal rate and regular rhythm.     Pulses: Normal pulses.  Pulmonary:     Effort: Pulmonary effort is normal.     Breath sounds: Wheezing present.     Comments: Expiratory wheezes heard throughout.  No rales.  Patient able to speak in full sentences without labored breathing. Chest:     Chest wall: No tenderness.  Abdominal:     General: There is no distension.     Palpations: Abdomen is soft.     Tenderness: There is no abdominal tenderness.  Musculoskeletal: Normal range of motion.     Right lower leg: No edema.     Left lower leg: No edema.  Lymphadenopathy:     Cervical: No cervical adenopathy.  Skin:    General: Skin is warm.     Findings: No erythema or rash.  Neurological:     General: No focal deficit  present.      ED Treatments / Results  Labs (all labs ordered are listed, but only abnormal results are displayed) Labs Reviewed  GROUP A STREP BY PCR  SARS CORONAVIRUS 2    EKG None  Radiology Dg Chest Portable 1 View  Result Date: 08/16/2019 CLINICAL DATA:  Cough and fever EXAM: PORTABLE CHEST 1 VIEW COMPARISON:  None. FINDINGS: RIGHT middle lobe airspace disease is compatible with pneumonia. Cardiomediastinal silhouette is otherwise unremarkable. The LEFT lung  is clear. No pleural effusion or pneumothorax. IMPRESSION: RIGHT middle lobe airspace disease compatible with pneumonia. Electronically Signed   By: Margarette Canada M.D.   On: 08/16/2019 19:13    Procedures Procedures (including critical care time)  Medications Ordered in ED Medications  albuterol (VENTOLIN HFA) 108 (90 Base) MCG/ACT inhaler 6 puff (6 puffs Inhalation Given 08/16/19 1943)  predniSONE (DELTASONE) tablet 40 mg (40 mg Oral Given 08/16/19 1943)  amoxicillin (AMOXIL) capsule 1,000 mg (1,000 mg Oral Given 08/16/19 2042)  azithromycin (ZITHROMAX) tablet 500 mg (500 mg Oral Given 08/16/19 2042)     Initial Impression / Assessment and Plan / ED Course  I have reviewed the triage vital signs and the nursing notes.  Pertinent labs & imaging results that were available during my care of the patient were reviewed by me and considered in my medical decision making (see chart for details).       Pt with HR at triage documented at 130.  Upon enterning the room pt's heart rate was 108.  While talking with her rate went down to 90's.  She is actively wheezing.  No respiratory distress noted.    Pt tachycardic after receiving 6 puffs of albuterol MDI.  Heart rate in the 120s.  She is well-appearing, no clinical concerns for sepsis.  On recheck, she is tolerating oral fluids without difficulty and using her cell phone.   wheezing remains present on right, but improving.    2213 patient still has intermittent tachycardia.   While I was in the room talking with her, heart rate varies from 90s to 120s.  She does have some risk factors for possible PE.  I have discussed this with her and I feel that she needs further evaluation for possible PE.  She states that she is driving someone else's car and has received a phone call stating that she needs to come home to tend to her child.  She is requesting to leave.  I have explained the risk factors involved with her leaving including worsening condition and death.  Patient verbalized understanding of this but continues to request to leave.  She does agree to sign out AMA.  She states she will try to return once she settles her home situation.  Final Clinical Impressions(s) / ED Diagnoses   Final diagnoses:  Community acquired pneumonia of right middle lobe of lung [facility])    ED Discharge Orders    None       Kem Parkinson, PA-C 08/17/19 1709    Daleen Bo, MD 08/17/19 2317

## 2019-08-16 NOTE — ED Triage Notes (Signed)
Pt states that she has fever, congestion, sob, cough, runny nose, and head aches since Friday. Her fever has been 99.8 at home.

## 2019-08-16 NOTE — ED Triage Notes (Signed)
Pt states she was seen earlier and now want ct of chest.

## 2019-08-16 NOTE — Discharge Instructions (Addendum)
Return here for any worsening symptoms or if you change your mind

## 2019-08-17 LAB — TSH: TSH: 0.001 u[IU]/mL — ABNORMAL LOW (ref 0.350–4.500)

## 2019-08-17 LAB — BASIC METABOLIC PANEL
Anion gap: 9 (ref 5–15)
BUN: 12 mg/dL (ref 6–20)
CO2: 21 mmol/L — ABNORMAL LOW (ref 22–32)
Calcium: 9.3 mg/dL (ref 8.9–10.3)
Chloride: 104 mmol/L (ref 98–111)
Creatinine, Ser: 0.57 mg/dL (ref 0.44–1.00)
GFR calc Af Amer: >60 mL/min (ref >60)
GFR calc non Af Amer: >60 mL/min (ref >60)
Glucose, Bld: 243 mg/dL — ABNORMAL HIGH (ref 70–99)
Potassium: 3.9 mmol/L (ref 3.5–5.1)
Sodium: 134 mmol/L — ABNORMAL LOW (ref 135–145)

## 2019-08-17 LAB — CBC WITH DIFFERENTIAL/PLATELET
Abs Immature Granulocytes: 0.02 10*3/uL (ref 0.00–0.07)
Basophils Absolute: 0 10*3/uL (ref 0.0–0.1)
Basophils Relative: 0 %
Eosinophils Absolute: 0 10*3/uL (ref 0.0–0.5)
Eosinophils Relative: 0 %
HCT: 43.1 % (ref 36.0–46.0)
Hemoglobin: 13.9 g/dL (ref 12.0–15.0)
Immature Granulocytes: 0 %
Lymphocytes Relative: 8 %
Lymphs Abs: 0.7 10*3/uL (ref 0.7–4.0)
MCH: 27.2 pg (ref 26.0–34.0)
MCHC: 32.3 g/dL (ref 30.0–36.0)
MCV: 84.3 fL (ref 80.0–100.0)
Monocytes Absolute: 0.1 10*3/uL (ref 0.1–1.0)
Monocytes Relative: 1 %
Neutro Abs: 8.5 10*3/uL — ABNORMAL HIGH (ref 1.7–7.7)
Neutrophils Relative %: 91 %
Platelets: 358 10*3/uL (ref 150–400)
RBC: 5.11 MIL/uL (ref 3.87–5.11)
RDW: 12.6 % (ref 11.5–15.5)
WBC: 9.4 10*3/uL (ref 4.0–10.5)
nRBC: 0 % (ref 0.0–0.2)

## 2019-08-17 LAB — D-DIMER, QUANTITATIVE: D-Dimer, Quant: <0.27 ug/mL-FEU (ref 0.00–0.50)

## 2019-08-17 LAB — SARS CORONAVIRUS 2 BY RT PCR (HOSPITAL ORDER, PERFORMED IN ~~LOC~~ HOSPITAL LAB): SARS Coronavirus 2: NEGATIVE

## 2019-08-17 MED ORDER — SODIUM CHLORIDE 0.9 % IV BOLUS
1000.0000 mL | Freq: Once | INTRAVENOUS | Status: AC
  Administered 2019-08-17: 1000 mL via INTRAVENOUS

## 2019-08-17 NOTE — Discharge Instructions (Addendum)
There is no evidence of blood clot.  Check your thyroid function on mychart. Continue the medications you are prescribed for pneumonia.  Continue the steroids for her asthma and use your inhalers every 4 hours for the next 48 hours and then every 4 hours as needed.  Follow-up with your doctor.  Return to the ED if develop new or worsening symptoms.

## 2019-08-17 NOTE — ED Provider Notes (Signed)
[facility] EMERGENCY DEPARTMENT Provider Note   CSN: [id number] Arrival date & time: 08/16/19  2330     History   Chief Complaint Chief Complaint  Patient presents with  . Shortness of Breath    HPI Peggy [name] is a 28 y.o. female.     Patient with history of asthma, currently breast-feeding.  Seen earlier today for several day history of cough, fevers and congestion diagnosed with right-sided pneumonia.  Discharged with antibiotics and steroids.  Persistently tachycardic and there was some concern for PE.  Patient left AMA and now returns requesting further evaluation for PE.  States she does somewhat feel short of breath and is tachycardic and wheezing.  States she is been using her albuterol at home but continues to have a nonproductive cough.  Her coronavirus test is pending from earlier today.  States she has been admitted to the hospital for asthma in the past but not recently.  She does have an IUD birth control and is concerned about the possibility of PE.  She does have pain with breathing and pain with coughing.  She denies abdominal pain, nausea, vomiting.  No leg pain or leg swelling.  No sick contacts at home or known coronavirus exposures.  The history is provided by the patient.  Shortness of Breath Associated symptoms: cough and fever   Associated symptoms: no abdominal pain, no headaches, no rash and no vomiting     Past Medical History:  Diagnosis Date  . Asthma   . Gestational diabetes   . Pyloric stenosis     Patient Active Problem List   Diagnosis Date Noted  . Encounter for initial prescription of intrauterine contraceptive device (IUD) 05/25/2019  . Depression screening 05/25/2019  . Encounter for postpartum visit 05/25/2019  . Indication for care in labor or delivery 04/26/2019  . Group beta Strep positive 04/26/2019  . SVD (spontaneous vaginal delivery) 04/26/2019  . History of gestational diabetes 10/28/2018  . Low vitamin D level  02/12/2017  . Smoker 07/25/2014    Past Surgical History:  Procedure Laterality Date  . PYLOROMYOTOMY       OB History    Gravida  4   Para  4   Term  4   Preterm  0   AB  0   Living  4     SAB  0   TAB  0   Ectopic  0   Multiple  0   Live Births  4            Home Medications    Prior to Admission medications   Medication Sig Start Date End Date Taking? Authorizing Provider  acetaminophen (TYLENOL) 325 MG tablet Take 650 mg by mouth every 6 (six) hours as needed for mild pain or headache.    [provider]  albuterol (PROVENTIL) (2.5 MG/3ML) 0.083% nebulizer solution Take 3 mLs (2.5 mg total) by nebulization every 6 (six) hours as needed for wheezing or shortness of breath. 08/16/19   Triplett, Tammy, PA-C  amoxicillin (AMOXIL) 500 MG capsule Take 2 capsules (1,000 mg total) by mouth 3 (three) times daily. 08/16/19   Triplett, Tammy, PA-C  azithromycin (ZITHROMAX) 250 MG tablet Take first 2 tablets together, then 1 every day until finished. 08/16/19   Triplett, Tammy, PA-C  Levonorgestrel (KYLEENA) 19.5 MG IUD by Intrauterine route.    [provider]  predniSONE (DELTASONE) 20 MG tablet Take 2 tablets (40 mg total) by mouth daily. 08/16/19  Triplett, Tammy, PA-C    Family History Family History  Problem Relation Age of Onset  . Arthritis Mother   . Cancer Mother        thyroid  . Depression Mother   . Hyperlipidemia Father   . Asthma Father   . Hypertension Father   . Heart disease Father   . COPD Maternal Grandfather   . Diabetes Paternal Grandmother   . Arthritis Paternal Grandmother   . Asthma Paternal Grandmother   . Heart disease Paternal Grandmother   . Depression Paternal Grandmother   . Hypertension Paternal Grandmother   . Hyperlipidemia Paternal Grandmother   . Other Neg Hx     Social History Social History   Tobacco Use  . Smoking status: Current Every Day Smoker    Packs/day: 0.25    Years: 5.00    Pack  years: 1.25    Types: Cigarettes  . Smokeless tobacco: Never Used  . Tobacco comment: 2 cig/day  Substance Use Topics  . Alcohol use: No  . Drug use: No     Allergies   Patient has no known allergies.   Review of Systems Review of Systems  Constitutional: Positive for activity change, fatigue and fever.  HENT: Positive for congestion and rhinorrhea.   Respiratory: Positive for cough, chest tightness and shortness of breath.   Gastrointestinal: Negative for abdominal pain, nausea and vomiting.  Genitourinary: Negative for dysuria and hematuria.  Musculoskeletal: Positive for arthralgias and myalgias.  Skin: Negative for rash.  Neurological: Negative for dizziness, weakness and headaches.   all other systems are negative except as noted in the HPI and PMH.     Physical Exam Updated Vital Signs BP 128/82   Pulse (!) 122   Temp 98.1 F (36.7 C)   Resp 18   Ht 5' (1.524 m)   Wt 57.2 kg   SpO2 96%   BMI 24.63 kg/m   Physical Exam Vitals signs and nursing note reviewed.  Constitutional:      General: She is not in acute distress.    Appearance: She is well-developed.     Comments: Speaking in full sentences, no significant respiratory distress  HENT:     Head: Normocephalic and atraumatic.     Mouth/Throat:     Pharynx: No oropharyngeal exudate.  Eyes:     Conjunctiva/sclera: Conjunctivae normal.     Pupils: Pupils are equal, round, and reactive to light.  Neck:     Musculoskeletal: Normal range of motion and neck supple.     Comments: No meningismus. Cardiovascular:     Rate and Rhythm: Regular rhythm. Tachycardia present.     Heart sounds: Normal heart sounds. No murmur.     Comments: Tachycardic 110s to 120s Pulmonary:     Effort: Pulmonary effort is normal. No respiratory distress.     Breath sounds: Wheezing present.     Comments: Normal work of breathing, expiratory expiratory wheezing throughout Abdominal:     Palpations: Abdomen is soft.      Tenderness: There is no abdominal tenderness. There is no guarding or rebound.  Musculoskeletal: Normal range of motion.        General: No tenderness.  Skin:    General: Skin is warm.  Neurological:     Mental Status: She is alert and oriented to person, place, and time.     Cranial Nerves: No cranial nerve deficit.     Motor: No abnormal muscle tone.     Coordination: Coordination normal.  Comments:  5/5 strength throughout. CN 2-12 intact.Equal grip strength.   Psychiatric:        Behavior: Behavior normal.      ED Treatments / Results  Labs (all labs ordered are listed, but only abnormal results are displayed) Labs Reviewed  CBC WITH DIFFERENTIAL/PLATELET - Abnormal; Notable for the following components:      Result Value   Neutro Abs 8.5 (*)    All other components within normal limits  BASIC METABOLIC PANEL - Abnormal; Notable for the following components:   Sodium 134 (*)    CO2 21 (*)    Glucose, Bld 243 (*)    All other components within normal limits  TSH - Abnormal; Notable for the following components:   TSH 0.001 (*)    All other components within normal limits  SARS CORONAVIRUS 2 (HOSPITAL ORDER, [facility])  D-DIMER, QUANTITATIVE (NOT AT [facility])    EKG EKG Interpretation  Date/Time:  Tuesday August 17 2019 00:13:19 EDT Ventricular Rate:  117 PR Interval:    QRS Duration: 71 QT Interval:  317 QTC Calculation: 443 R Axis:   76 Text Interpretation:  Sinus tachycardia Rate faster Confirmed by Ezequiel Essex [phone]) on 08/17/2019 1:27:31 AM   Radiology Dg Chest Portable 1 View  Result Date: 08/16/2019 CLINICAL DATA:  Cough and fever EXAM: PORTABLE CHEST 1 VIEW COMPARISON:  None. FINDINGS: RIGHT middle lobe airspace disease is compatible with pneumonia. Cardiomediastinal silhouette is otherwise unremarkable. The LEFT lung is clear. No pleural effusion or pneumothorax. IMPRESSION: RIGHT middle lobe airspace disease compatible  with pneumonia. Electronically Signed   By: Margarette Canada M.D.   On: 08/16/2019 19:13    Procedures Procedures (including critical care time)  Medications Ordered in ED Medications  sodium chloride 0.9 % bolus 1,000 mL (has no administration in time range)  albuterol (VENTOLIN HFA) 108 (90 Base) MCG/ACT inhaler 4 puff (has no administration in time range)     Initial Impression / Assessment and Plan / ED Course  I have reviewed the triage vital signs and the nursing notes.  Pertinent labs & imaging results that were available during my care of the patient were reviewed by me and considered in my medical decision making (see chart for details).       Patient with asthma and recent evaluation for asthma exacerbation with pneumonia.  Returns with persistent tachycardia and concern for PE. CXR with RML pneumonia on previous visit.  She is wheezing on exam but not hypoxic.  She is tachycardic.  Will obtain labs including d-dimer and hydrate.  D-dimer is negative.  Patient does have tachycardia which improves with rest.  She goes down to the 100s with rest but jumps back up to the 120s. Low concern for PE with negative d-dimer.  Will give additional hydration.  Some of her tachycardia may be due to her albuterol use.  We will also check TSH as patient states there is a family history of thyroid problems.  Wheezing has improved still present on the right minimally.  Coronavirus negative.  Blood sugar 243 without anion gap.  No history of diabetes.  Patient did receive steroids during previous ED visit  Heart rate 100 but elevates to 110 with standing.  Patient ambulatory without desaturation. 2 liters IVF given.Marland Kitchen   HR Improved to 102.  Patient anxious to leave.  She does not want to wait for TSH result and agrees to check it on MyChart when she gets home.  Continue prednisone as well as antibiotics for pneumonia.  Will refill her bronchodilators. Establish care with a PCP.  Return  precautions discussed  Addendum: TSH subsequently returned undetectable.  Concern for hyperthyroidism without evidence of thyroid storm. Will attempt to contact by phone in the AM.  Final Clinical Impressions(s) / ED Diagnoses   Final diagnoses:  Community acquired pneumonia of right middle lobe of lung ([place])  Moderate persistent asthma with exacerbation    ED Discharge Orders    None       Willies Laviolette, Annie Main, MD 08/17/19 0400

## 2019-08-17 NOTE — ED Notes (Signed)
Pt ambulatory without difficulty or distress.

## 2019-08-26 ENCOUNTER — Other Ambulatory Visit: Payer: Medicaid Other | Admitting: Adult Health

## 2019-08-30 ENCOUNTER — Other Ambulatory Visit: Payer: Medicaid Other | Admitting: Advanced Practice Midwife

## 2019-08-31 ENCOUNTER — Encounter: Payer: Self-pay | Admitting: "Endocrinology

## 2019-08-31 ENCOUNTER — Other Ambulatory Visit: Payer: Self-pay

## 2019-08-31 ENCOUNTER — Ambulatory Visit (INDEPENDENT_AMBULATORY_CARE_PROVIDER_SITE_OTHER): Payer: Medicaid Other | Admitting: "Endocrinology

## 2019-08-31 VITALS — BP 136/82 | HR 123 | Ht 61.0 in | Wt 125.0 lb

## 2019-08-31 DIAGNOSIS — E0590 Thyrotoxicosis, unspecified without thyrotoxic crisis or storm: Secondary | ICD-10-CM

## 2019-08-31 MED ORDER — METHIMAZOLE 5 MG PO TABS: 5.0000 mg | ORAL_TABLET | Freq: Two times a day (BID) | ORAL | 2 refills | Status: DC

## 2019-08-31 MED ORDER — PREDNISONE 20 MG PO TABS: 20.0000 mg | ORAL_TABLET | Freq: Every day | ORAL | 0 refills | Status: DC

## 2019-08-31 MED ORDER — METHIMAZOLE 10 MG PO TABS: 10.0000 mg | ORAL_TABLET | Freq: Three times a day (TID) | ORAL | 2 refills | Status: DC

## 2019-08-31 MED ORDER — PROPRANOLOL HCL 40 MG PO TABS: 20.0000 mg | ORAL_TABLET | Freq: Two times a day (BID) | ORAL | 2 refills | Status: DC

## 2019-08-31 NOTE — Progress Notes (Signed)
Endocrinology Consult Note    Subjective:    Patient ID: Peggy [name], female    DOB: June 08, 1991.   Past Medical History:  Diagnosis Date  . Asthma   . Gestational diabetes   . Pyloric stenosis     Past Surgical History:  Procedure Laterality Date  . PYLOROMYOTOMY      Social History   Socioeconomic History  . Marital status: Legally Separated    Spouse name: Peggy [name]  . Number of children: 2  . Years of education: Not on file  . Highest education level: Some college, no degree  Occupational History  . Not on file  Social Needs  . Financial resource strain: Not hard at all  . Food insecurity    Worry: Never true    Inability: Never true  . Transportation needs    Medical: No    Non-medical: No  Tobacco Use  . Smoking status: Current Every Day Smoker    Packs/day: 0.25    Years: 5.00    Pack years: 1.25    Types: Cigarettes  . Smokeless tobacco: Never Used  . Tobacco comment: 2 cig/day  Substance and Sexual Activity  . Alcohol use: No  . Drug use: No  . Sexual activity: Not Currently    Birth control/protection: I.U.D.  Lifestyle  . Physical activity    Days per week: 2 days    Minutes per session: 30 min  . Stress: To some extent  Relationships  . Social Herbalist on phone: Three times a week    Gets together: Once a week    Attends religious service: 1 to 4 times per year    Active member of club or organization: No    Attends meetings of clubs or organizations: Never    Relationship status: Separated  Other Topics Concern  . Not on file  Social History Narrative  . Not on file    Family History  Problem Relation Age of Onset  . Arthritis Mother   . Cancer Mother        thyroid  . Depression Mother   . Hyperlipidemia Father   . Asthma Father   . Hypertension Father   . Heart disease Father   . COPD Maternal Grandfather   . Diabetes Paternal Grandmother   . Arthritis Paternal Grandmother   . Asthma  Paternal Grandmother   . Heart disease Paternal Grandmother   . Depression Paternal Grandmother   . Hypertension Paternal Grandmother   . Hyperlipidemia Paternal Grandmother   . Other Neg Hx     Outpatient Encounter Medications as of 08/31/2019  Medication Sig  . acetaminophen (TYLENOL) 325 MG tablet Take 650 mg by mouth every 6 (six) hours as needed for mild pain or headache.  . albuterol (PROVENTIL) (2.5 MG/3ML) 0.083% nebulizer solution Take 3 mLs (2.5 mg total) by nebulization every 6 (six) hours as needed for wheezing or shortness of breath.  Marland Kitchen amoxicillin (AMOXIL) 500 MG capsule Take 2 capsules (1,000 mg total) by mouth 3 (three) times daily.  Marland Kitchen azithromycin (ZITHROMAX) 250 MG tablet Take first 2 tablets together, then 1 every day until finished.  . Levonorgestrel (KYLEENA) 19.5 MG IUD by Intrauterine route.  . methimazole (TAPAZOLE) 5 MG tablet Take 1 tablet (5 mg total) by mouth 2 (two) times daily.  . predniSONE (DELTASONE) 20 MG tablet Take 2 tablets (40 mg total) by mouth daily.  . predniSONE (DELTASONE) 20 MG tablet Take 1 tablet (20 mg  total) by mouth daily with breakfast.  . propranolol (INDERAL) 40 MG tablet Take 0.5 tablets (20 mg total) by mouth 2 (two) times daily.  . [DISCONTINUED] methimazole (TAPAZOLE) 10 MG tablet Take 1 tablet (10 mg total) by mouth 3 (three) times daily.   No facility-administered encounter medications on file as of 08/31/2019.     ALLERGIES: No Known Allergies  VACCINATION STATUS: Immunization History  Administered Date(s) Administered  . Influenza,inj,Quad PF,6+ Mos 12/11/2014, 09/23/2018  . MMR 12/12/2014  . Pneumococcal Polysaccharide-23 07/12/2012, 12/12/2014, 08/17/2017  . Tdap 12/11/2014, 06/02/2017, 02/18/2019     HPI  Peggy [name] is 28 y.o. female who presents today with a medical history as above. she is being seen in consultation for hyperthyroidism.  She was recently seen in the emergency room for shortness of  breath/community-acquired pneumonia, and work-up showed undetectable TSH suspicious for hyperthyroidism and her ER physician Dr Wyvonnia Dusky suggested for her to be seen by endocrinology.  She had experienced typical symptoms including weight loss, palpitations, tremors, heat intolerance/sweating for several weeks now.  She is 4 months postpartum, breast-feeding and planning to continue to breast-feed.   Lab Results  Component Value Date   TSH 0.001 (L) 08/17/2019   . she denies dysphagia, choking, shortness of breath, no recent voice change. These symptoms are progressively worsening and troubling to her. she reports thyroid cancer in her mother who had to undergo thyroidectomy and what appears to be radioactive iodine thyroid remnant ablation.   she denies personal history of goiter. she is not on any anti-thyroid medications nor on any thyroid hormone supplements. she  is willing to proceed with appropriate work up and therapy for thyrotoxicosis.                           Review of systems  Constitutional: + weight loss, + fatigue, + subjective hyperthermia Eyes: no blurry vision, + xerophthalmia ENT: no sore throat, no nodules palpated in throat, no dysphagia/odynophagia, nor hoarseness Cardiovascular: no Chest Pain, no Shortness of Breath, ++  palpitations, no leg swelling Respiratory: no cough, no SOB Gastrointestinal: no Nausea, no Vomiting, no Diarhhea Musculoskeletal: no muscle/joint aches Skin: no rashes Neurological: ++  tremors, no numbness, no tingling, no dizziness Psychiatric: no depression, ++  anxiety   Objective:    BP 136/82   Pulse (!) 123   Ht '5\' 1"'  (1.549 m)   Wt 125 lb (56.7 kg)   BMI 23.62 kg/m   Wt Readings from Last 3 Encounters:  08/31/19 125 lb (56.7 kg)  08/16/19 126 lb 1.7 oz (57.2 kg)  08/16/19 126 lb (57.2 kg)                                                Physical exam  Constitutional: + Appropriate weight for height, not in acute distress, +  anxious state of mind Eyes: PERRLA, EOMI, - exophthalmos ENT: moist mucous membranes, +  thyromegaly, no cervical lymphadenopathy Cardiovascular: + Hyperactive precordium, tachycardic at 125 bpm,  Respiratory:  adequate breathing efforts, no gross chest deformity, Clear to auscultation bilaterally Gastrointestinal: abdomen soft, Non -tender, No distension, Bowel Sounds present Musculoskeletal: no gross deformities, strength intact in all four extremities Skin: moist, warm, no rashes Neurological: ++  tremor with outstretched hands,  ++ Deep Tendon Reflexes  on both lower extremities.  CMP     Component Value Date/Time   NA 134 (L) 08/17/2019 0004   K 3.9 08/17/2019 0004   CL 104 08/17/2019 0004   CO2 21 (L) 08/17/2019 0004   GLUCOSE 243 (H) 08/17/2019 0004   BUN 12 08/17/2019 0004   CREATININE 0.57 08/17/2019 0004   CALCIUM 9.3 08/17/2019 0004   PROT 8.2 (H) 10/08/2016 2239   ALBUMIN 4.2 10/08/2016 2239   AST 22 10/08/2016 2239   ALT 25 10/08/2016 2239   ALKPHOS 59 10/08/2016 2239   BILITOT 0.3 10/08/2016 2239   GFRNONAA >60 08/17/2019 0004   GFRAA >60 08/17/2019 0004     CBC    Component Value Date/Time   WBC 9.4 08/17/2019 0004   RBC 5.11 08/17/2019 0004   HGB 13.9 08/17/2019 0004   HGB 11.8 01/28/2019 0844   HCT 43.1 08/17/2019 0004   HCT 34.8 01/28/2019 0844   PLT 358 08/17/2019 0004   PLT 328 01/28/2019 0844   MCV 84.3 08/17/2019 0004   MCV 89 01/28/2019 0844   MCH 27.2 08/17/2019 0004   MCHC 32.3 08/17/2019 0004   RDW 12.6 08/17/2019 0004   RDW 12.7 01/28/2019 0844   LYMPHSABS 0.7 08/17/2019 0004   LYMPHSABS 2.7 09/25/2018 0847   MONOABS 0.1 08/17/2019 0004   EOSABS 0.0 08/17/2019 0004   EOSABS 0.3 09/25/2018 0847   BASOSABS 0.0 08/17/2019 0004   BASOSABS 0.0 09/25/2018 0847     Diabetic Labs (most recent): Lab Results  Component Value Date   HGBA1C 5.0 09/25/2018    Lipid Panel  No results found for: CHOL, TRIG, HDL, CHOLHDL, VLDL, LDLCALC,  LDLDIRECT   Lab Results  Component Value Date   TSH 0.001 (L) 08/17/2019        Assessment & Plan:   1. Hyperthyroidism  she is being seen at a kind request of Dr Wyvonnia Dusky from emergency room.  She is currently trying to establish with a new PMD-Dr. Benny Lennert. her history and most recent labs are reviewed, and she was examined clinically. 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 The potential risks of untreated thyrotoxicosis and the need for definitive therapy have been discussed in detail with her, and she agrees to proceed with diagnostic workup and treatment plan.  -Ideally, she would need confirmatory thyroid uptake and scan, however, currently she is breast-feeding and planning to breast-feed for the next several month.  In addition, she is very symptomatic at risk of eminent thyroid storm. -I discussed and decided to treat her with antithyroid medications until she achieves a stability. -I discussed and initiated prednisone 20 mg p.o. daily, propranolol 40 mg p.o. twice daily, and methimazole 5 mg p.o. twice daily.)  There is some risk of exposure to the infant from  Methimazole at high doses, will advance it slowly if necessary.  Options of therapy are discussed with her including thyroid ablation with I-131 preceded by thyroid uptake and scan which will be considered after she completes her breast-feeding.   Given her family history of thyroid malignancy, she will be considered for baseline thyroid ultrasound after her next visit. she will return in 4 weeks with repeat thyroid function test to assess treatment effect.    - I advised her to maintain close follow up with her new PMD , Dr. Benny Lennert.   - Time spent with the patient: 45 minutes, of which >50% was spent in obtaining information about her symptoms, reviewing her previous labs, evaluations, and  treatments, counseling her about her hyperthyroidism, and  developing a plan to confirm the diagnosis and long term treatment as necessary. Please refer to " Patient Self Inventory" in the Media  tab for reviewed elements of pertinent patient history.  Peggy [name] participated in the discussions, expressed understanding, and voiced agreement with the above plans.  All questions were answered to her satisfaction. she is encouraged to contact clinic should she have any questions or concerns prior to her return visit.   Follow up plan: Return in about 4 weeks (around 09/28/2019) for Follow up with Pre-visit Labs.   Thank you for involving me in the care of this pleasant patient, and I will continue to update you with her progress.  Glade Lloyd, MD [facility] Phone: [phone]  Fax: [phone]   08/31/2019, 4:52 PM  This note was partially dictated with voice recognition software. Similar sounding words can be transcribed inadequately or may not  be corrected upon review.

## 2019-09-27 ENCOUNTER — Other Ambulatory Visit: Payer: Self-pay | Admitting: "Endocrinology

## 2019-09-27 DIAGNOSIS — E0590 Thyrotoxicosis, unspecified without thyrotoxic crisis or storm: Secondary | ICD-10-CM | POA: Diagnosis not present

## 2019-09-27 LAB — TSH: TSH: 0.01 — AB (ref 0.41–5.90)

## 2019-09-30 ENCOUNTER — Encounter: Payer: Self-pay | Admitting: "Endocrinology

## 2019-09-30 ENCOUNTER — Other Ambulatory Visit: Payer: Self-pay

## 2019-09-30 ENCOUNTER — Ambulatory Visit (INDEPENDENT_AMBULATORY_CARE_PROVIDER_SITE_OTHER): Payer: Medicaid Other | Admitting: "Endocrinology

## 2019-09-30 DIAGNOSIS — E0590 Thyrotoxicosis, unspecified without thyrotoxic crisis or storm: Secondary | ICD-10-CM

## 2019-09-30 NOTE — Progress Notes (Signed)
09/30/2019                                Endocrinology Telehealth Visit Follow up Note -During COVID -19 Pandemic  I connected with Peggy [name] on 09/30/2019   by telephone and verified that I am speaking with the correct person using two identifiers. Peggy [name], November 29, 1991. she has verbally consented to this visit. All issues noted in this document were discussed and addressed. The format was not optimal for physical exam.  Subjective:    Patient ID: Peggy [name], female    DOB: February 10, 1991.   Past Medical History:  Diagnosis Date  . Asthma   . Gestational diabetes   . Pyloric stenosis     Past Surgical History:  Procedure Laterality Date  . PYLOROMYOTOMY      Social History   Socioeconomic History  . Marital status: Legally Separated    Spouse name: Aija Scarfo  . Number of children: 2  . Years of education: Not on file  . Highest education level: Some college, no degree  Occupational History  . Not on file  Social Needs  . Financial resource strain: Not hard at all  . Food insecurity    Worry: Never true    Inability: Never true  . Transportation needs    Medical: No    Non-medical: No  Tobacco Use  . Smoking status: Current Every Day Smoker    Packs/day: 0.25    Years: 5.00    Pack years: 1.25    Types: Cigarettes  . Smokeless tobacco: Never Used  . Tobacco comment: 2 cig/day  Substance and Sexual Activity  . Alcohol use: No  . Drug use: No  . Sexual activity: Not Currently    Birth control/protection: I.U.D.  Lifestyle  . Physical activity    Days per week: 2 days    Minutes per session: 30 min  . Stress: To some extent  Relationships  . Social Herbalist on phone: Three times a week    Gets together: Once a week    Attends religious service: 1 to 4 times per year    Active member of club or organization: No    Attends meetings of clubs or organizations: Never    Relationship status: Separated  Other Topics  Concern  . Not on file  Social History Narrative  . Not on file    Family History  Problem Relation Age of Onset  . Arthritis Mother   . Cancer Mother        thyroid  . Depression Mother   . Hyperlipidemia Father   . Asthma Father   . Hypertension Father   . Heart disease Father   . COPD Maternal Grandfather   . Diabetes Paternal Grandmother   . Arthritis Paternal Grandmother   . Asthma Paternal Grandmother   . Heart disease Paternal Grandmother   . Depression Paternal Grandmother   . Hypertension Paternal Grandmother   . Hyperlipidemia Paternal Grandmother   . Other Neg Hx     Outpatient Encounter Medications as of 09/30/2019  Medication Sig  . methimazole (TAPAZOLE) 10 MG tablet Take 10 mg by mouth 2 (two) times daily after a meal.  . acetaminophen (TYLENOL) 325 MG tablet Take 650 mg by mouth every 6 (six) hours as needed for mild pain or headache.  . albuterol (PROVENTIL) (2.5 MG/3ML) 0.083% nebulizer solution Take 3 mLs (  2.5 mg total) by nebulization every 6 (six) hours as needed for wheezing or shortness of breath.  Marland Kitchen amoxicillin (AMOXIL) 500 MG capsule Take 2 capsules (1,000 mg total) by mouth 3 (three) times daily.  Marland Kitchen azithromycin (ZITHROMAX) 250 MG tablet Take first 2 tablets together, then 1 every day until finished.  . Levonorgestrel (KYLEENA) 19.5 MG IUD by Intrauterine route.  . predniSONE (DELTASONE) 20 MG tablet Take 1 tablet (20 mg total) by mouth daily with breakfast.  . propranolol (INDERAL) 40 MG tablet Take 0.5 tablets (20 mg total) by mouth 2 (two) times daily.  . [DISCONTINUED] methimazole (TAPAZOLE) 5 MG tablet Take 1 tablet (5 mg total) by mouth 2 (two) times daily.   No facility-administered encounter medications on file as of 09/30/2019.     ALLERGIES: No Known Allergies  VACCINATION STATUS: Immunization History  Administered Date(s) Administered  . Influenza,inj,Quad PF,6+ Mos 12/11/2014, 09/23/2018  . MMR 12/12/2014  . Pneumococcal  Polysaccharide-23 07/12/2012, 12/12/2014, 08/17/2017  . Tdap 12/11/2014, 06/02/2017, 02/18/2019     HPI  Peggy [name] is 28 y.o. female who presents today with a medical history as above. she is being engaged in telehealth via telephone for follow-up after she was seen in consultation for hyperthyroidism.  She was recently seen in the emergency room for shortness of breath/community-acquired pneumonia, and work-up showed undetectable TSH suspicious for hyperthyroidism and her ER physician Dr Wyvonnia Dusky suggested for her to be seen by endocrinology.  She had experienced typical symptoms including weight loss, palpitations, tremors, heat intolerance/sweating for several weeks now.   -Due to her desire to continue to breast-feed her 45-monthold child, radioactive and treatment was not offered.  She was initiated on methimazole 5 mg p.o. twice daily along with low-dose prednisone and propranolol.  She feels better, however her previsit labs are showing still   High thyroid hormone burden.  she denies dysphagia, choking, shortness of breath, no recent voice change.  she reports thyroid cancer in her mother who had to undergo thyroidectomy and what appears to be radioactive iodine thyroid remnant ablation.   she denies personal history of goiter. -She plans to continue to breast-feed her child, and wishes to delay intervention with radioactive iodine.                           Review of systems See the note from last visit.   Objective:    There were no vitals taken for this visit.  Wt Readings from Last 3 Encounters:  08/31/19 125 lb (56.7 kg)  08/16/19 126 lb 1.7 oz (57.2 kg)  08/16/19 126 lb (57.2 kg)                                                Physical exam  -See the note from last visit.   CMP     Component Value Date/Time   NA 134 (L) 08/17/2019 0004   K 3.9 08/17/2019 0004   CL 104 08/17/2019 0004   CO2 21 (L) 08/17/2019 0004   GLUCOSE 243 (H) 08/17/2019 0004   BUN  12 08/17/2019 0004   CREATININE 0.57 08/17/2019 0004   CALCIUM 9.3 08/17/2019 0004   PROT 8.2 (H) 10/08/2016 2239   ALBUMIN 4.2 10/08/2016 2239   AST 22 10/08/2016 2239   ALT 25 10/08/2016 2239  ALKPHOS 59 10/08/2016 2239   BILITOT 0.3 10/08/2016 2239   GFRNONAA >60 08/17/2019 0004   GFRAA >60 08/17/2019 0004     CBC    Component Value Date/Time   WBC 9.4 08/17/2019 0004   RBC 5.11 08/17/2019 0004   HGB 13.9 08/17/2019 0004   HGB 11.8 01/28/2019 0844   HCT 43.1 08/17/2019 0004   HCT 34.8 01/28/2019 0844   PLT 358 08/17/2019 0004   PLT 328 01/28/2019 0844   MCV 84.3 08/17/2019 0004   MCV 89 01/28/2019 0844   MCH 27.2 08/17/2019 0004   MCHC 32.3 08/17/2019 0004   RDW 12.6 08/17/2019 0004   RDW 12.7 01/28/2019 0844   LYMPHSABS 0.7 08/17/2019 0004   LYMPHSABS 2.7 09/25/2018 0847   MONOABS 0.1 08/17/2019 0004   EOSABS 0.0 08/17/2019 0004   EOSABS 0.3 09/25/2018 0847   BASOSABS 0.0 08/17/2019 0004   BASOSABS 0.0 09/25/2018 0847     Diabetic Labs (most recent): Lab Results  Component Value Date   HGBA1C 5.0 09/25/2018    Lab Results  Component Value Date   TSH 0.01 (A) 09/27/2019   TSH 0.001 (L) 08/17/2019        Assessment & Plan:   1. Hyperthyroidism 2.  Graves' disease  -Her presentation and objective findings are consistent with thyrotoxicosis likely from primary hyperthyroidism.  She has high degree of antithyroid antibodies, indicating Graves' disease as the etiology.   The potential risks of untreated thyrotoxicosis and the need for definitive therapy have been discussed in detail with her, and she has partial response antithyroid treatment with methimazole    -Ideally, she would need confirmatory thyroid uptake and scan in preparation for I-131 thyroid ablation, however, currently she is breast-feeding and planning to breast-feed for the next several month.  -In light of her high thyroid hormone and clinically symptomatic presentation, she will need  a higher dose of methimazole.    I discussed and increase her methimazole to 10 mg p.o. twice daily, continue propranolol 40 mg p.o. twice daily, and continue prednisone 20 mg p.o. daily.  Given her family history of thyroid malignancy, she will be considered for baseline thyroid ultrasound after her next visit. she will return in 9 weeks with repeat thyroid function test to assess treatment effect.    - I advised her to maintain close follow up with her new PMD , Dr. Benny Lennert.    Time for this visit: 15 minutes. Peggy [name]  participated in the discussions, expressed understanding, and voiced agreement with the above plans.  All questions were answered to her satisfaction. she is encouraged to contact clinic should she have any questions or concerns prior to her return visit.   Follow up plan: Return in about 9 weeks (around 12/02/2019) for Follow up with Pre-visit Labs.   Thank you for involving me in the care of this pleasant patient, and I will continue to update you with her progress.  Glade Lloyd, MD [facility] Phone: [phone]  Fax: [phone]   09/30/2019, 2:50 PM  This note was partially dictated with voice recognition software. Similar sounding words can be transcribed inadequately or may not  be corrected upon review.

## 2019-10-02 LAB — TSH+T3+FREE T4+T3 FREE
Free T-3: 6.6 pg/mL — ABNORMAL HIGH
Free T4 by Dialysis: 1.5 ng/dL
TSH: <0.01 uU/mL — ABNORMAL LOW
Triiodothyronine (T-3), Serum: 287 ng/dL — ABNORMAL HIGH

## 2019-10-02 LAB — THYROID PEROXIDASE ANTIBODY: Thyroperoxidase Ab SerPl-aCnc: 226 IU/mL — ABNORMAL HIGH (ref 0–34)

## 2019-10-02 LAB — THYROGLOBULIN ANTIBODY: Thyroglobulin Antibody: <1 IU/mL (ref 0.0–0.9)

## 2019-10-03 ENCOUNTER — Other Ambulatory Visit: Payer: Self-pay | Admitting: "Endocrinology

## 2019-10-07 ENCOUNTER — Ambulatory Visit: Payer: Medicaid Other | Admitting: Adult Health

## 2019-10-08 ENCOUNTER — Ambulatory Visit: Payer: Medicaid Other | Admitting: Adult Health

## 2019-10-21 ENCOUNTER — Encounter: Payer: Self-pay | Admitting: Advanced Practice Midwife

## 2019-10-21 ENCOUNTER — Other Ambulatory Visit: Payer: Self-pay

## 2019-10-21 ENCOUNTER — Ambulatory Visit: Payer: Medicaid Other | Admitting: Advanced Practice Midwife

## 2019-10-21 VITALS — BP 130/84 | HR 115 | Ht 60.0 in | Wt 123.0 lb

## 2019-10-21 DIAGNOSIS — Z0000 Encounter for general adult medical examination without abnormal findings: Secondary | ICD-10-CM

## 2019-10-21 DIAGNOSIS — Z01419 Encounter for gynecological examination (general) (routine) without abnormal findings: Secondary | ICD-10-CM

## 2019-10-21 DIAGNOSIS — M6289 Other specified disorders of muscle: Secondary | ICD-10-CM

## 2019-10-21 NOTE — Progress Notes (Addendum)
GYNECOLOGY ANNUAL PREVENTATIVE CARE ENCOUNTER NOTE  History:     Peggy [name] is a 28 y.o. [phone] female here for a routine annual gynecologic exam. Current complaints: Has had pain in left lower pelvic area and around tailbone since a few weeks before birth.  Feels MSK.  Denies abnormal vaginal bleeding, discharge, problems with intercourse or other gynecologic concerns.    Gynecologic History No LMP recorded. (Menstrual status: IUD). Contraception: IUD Last Pap: 01/2017 . Results were: normal with negative HPV  Obstetric History OB History  Gravida Para Term Preterm AB Living  4 4 4  0 0 4  SAB TAB Ectopic Multiple Live Births  0 0 0 0 4    # Outcome Date GA Lbr Len/2nd Weight Sex Delivery Anes PTL Lv  4 Term 04/26/19 [redacted]w[redacted]d 06:24 / 00:08 7 lb 9.7 oz (3.45 kg) M Vag-Spont None  LIV     Birth Comments: WNL  3 Term 08/15/17 [redacted]w[redacted]d 06:39 / 00:07 6 lb 4.5 oz (2.85 kg) F Vag-Spont EPI N LIV     Complications: Gestational diabetes  2 Term 12/10/14 [redacted]w[redacted]d 17:53 / 00:13 6 lb 2.6 oz (2.795 kg) F Vag-Spont EPI N LIV  1 Term 07/10/12 [redacted]w[redacted]d 26:17 6 lb 2.4 oz (2.79 kg) F Vag-Spont EPI N LIV    Past Medical History:  Diagnosis Date  . Asthma   . Gestational diabetes   . Pyloric stenosis   . Thyroid disease    Graves    Past Surgical History:  Procedure Laterality Date  . PYLOROMYOTOMY      Current Outpatient Medications on File Prior to Visit  Medication Sig Dispense Refill  . acetaminophen (TYLENOL) 325 MG tablet Take 650 mg by mouth every 6 (six) hours as needed for mild pain or headache.    . albuterol (PROVENTIL) (2.5 MG/3ML) 0.083% nebulizer solution Take 3 mLs (2.5 mg total) by nebulization every 6 (six) hours as needed for wheezing or shortness of breath. 75 mL 0  . Levonorgestrel (KYLEENA) 19.5 MG IUD by Intrauterine route.    . methimazole (TAPAZOLE) 10 MG tablet Take 10 mg by mouth 2 (two) times daily after a meal.    . propranolol (INDERAL) 40 MG tablet Take 0.5  tablets (20 mg total) by mouth 2 (two) times daily. 60 tablet 2  . predniSONE (DELTASONE) 20 MG tablet TAKE 1 TABLET BY MOUTH DAILY WITH BREAKFAST (Patient not taking: Reported on 10/21/2019) 30 tablet 0   No current facility-administered medications on file prior to visit.     No Known Allergies  Social History:  reports that she has been smoking cigarettes. She has a 1.25 pack-year smoking history. She has never used smokeless tobacco. She reports that she does not drink alcohol or use drugs.  Family History  Problem Relation Age of Onset  . Arthritis Mother   . Cancer Mother        thyroid  . Depression Mother   . Hyperlipidemia Father   . Asthma Father   . Hypertension Father   . Heart disease Father   . COPD Maternal Grandfather   . Diabetes Paternal Grandmother   . Arthritis Paternal Grandmother   . Asthma Paternal Grandmother   . Heart disease Paternal Grandmother   . Depression Paternal Grandmother   . Hypertension Paternal Grandmother   . Hyperlipidemia Paternal Grandmother   . Other Neg Hx     The following portions of the patient's history were reviewed and updated as appropriate: allergies, current  medications, past family history, past medical history, past social history, past surgical history and problem list.  Review of Systems Pertinent items noted in HPI and remainder of comprehensive ROS otherwise negative.  Physical Exam:  BP 130/84 (BP Location: Left Arm, Patient Position: Sitting, Cuff Size: Normal)   Pulse (!) 115   Ht 5' (1.524 m)   Wt 123 lb (55.8 kg)   BMI 24.02 kg/m  CONSTITUTIONAL: Well-developed, well-nourished female in no acute distress.  HENT:  Normocephalic, atraumatic, External right and left ear normal. Oropharynx is clear and moist EYES: Conjunctivae and EOM are normal. Pupils are equal, round, and reactive to light. No scleral icterus.  NECK: Normal range of motion, supple, no masses.  SKIN: Skin is warm and dry. No rash noted. Not  diaphoretic. No erythema. No pallor. MUSCULOSKELETAL: Normal range of motion. No tenderness.  No cyanosis, clubbing, or edema.  2+ distal pulses. NEUROLOGIC: Alert and oriented to person, place, and time. Normal reflexes, muscle tone coordination. No cranial nerve deficit noted. PSYCHIATRIC: Normal mood and affect. Normal behavior. Normal judgment and thought content. CARDIOVASCULAR: Normal heart rate noted, regular rhythm RESPIRATORY: Clear to auscultation bilaterally. Effort and breath sounds normal, no problems with respiration noted. BREASTS: deferred exam d/t breastfeeding ABDOMEN: Soft, normal bowel sounds, no distention noted.  No tenderness, rebound or guarding.  PELVIC: Normal appearing external genitalia; normal appearing vaginal mucosa and cervix.  No abnormal discharge noted.   Normal uterine size, no other palpable masses, no uterine or adnexal tenderness. Some areas of tenderness in levator area only   Assessment and Plan:    1. Encounter for gynecological examination withoutPapanicolaou smear of cervix (due in 2021)  2. Pelvic floor dysfunction in female  (talked w/Nancy) - Ambulatory referral to Physical Therapy  F/U pap next year.  Routine preventative health maintenance measures emphasized. Please refer to After Visit Summary for other counseling recommendations.     Orders Placed This Encounter  Procedures  . Ambulatory referral to Physical Therapy    Referral Priority:   Routine    Referral Type:   Physical Medicine    Referral Reason:   Specialty Services Required    Requested Specialty:   Physical Therapy    Number of Visits Requested:   1

## 2019-10-25 ENCOUNTER — Telehealth: Payer: Self-pay | Admitting: "Endocrinology

## 2019-10-25 NOTE — Telephone Encounter (Signed)
We can do TSH, free t4, free t3.

## 2019-10-25 NOTE — Telephone Encounter (Signed)
Is it too early for pt to do labs?

## 2019-10-25 NOTE — Telephone Encounter (Signed)
Patient thinks that her thyroid medication is not working. She said she feels dizzy, lightheaded and her heart is racing.

## 2019-10-26 ENCOUNTER — Ambulatory Visit (HOSPITAL_COMMUNITY): Payer: Medicaid Other | Attending: Advanced Practice Midwife | Admitting: Physical Therapy

## 2019-10-26 NOTE — Telephone Encounter (Signed)
Pt.notified

## 2019-10-26 NOTE — Telephone Encounter (Signed)
Called pt twice. Call dropped both times.

## 2019-10-26 NOTE — Telephone Encounter (Signed)
Pt would like you to call her.

## 2019-10-27 NOTE — Telephone Encounter (Signed)
Called pt no answer. Awaiting return call

## 2019-11-23 ENCOUNTER — Ambulatory Visit (HOSPITAL_COMMUNITY): Payer: Medicaid Other | Attending: Advanced Practice Midwife | Admitting: Physical Therapy

## 2019-11-29 ENCOUNTER — Other Ambulatory Visit: Payer: Self-pay | Admitting: Advanced Practice Midwife

## 2019-11-30 ENCOUNTER — Other Ambulatory Visit: Payer: Self-pay | Admitting: "Endocrinology

## 2019-11-30 DIAGNOSIS — E0590 Thyrotoxicosis, unspecified without thyrotoxic crisis or storm: Secondary | ICD-10-CM | POA: Diagnosis not present

## 2019-12-01 LAB — TSH: TSH: <0.005 u[IU]/mL — ABNORMAL LOW (ref 0.450–4.500)

## 2019-12-01 LAB — T4, FREE: Free T4: 1.76 ng/dL (ref 0.82–1.77)

## 2019-12-01 LAB — T3: T3, Total: 292 ng/dL — ABNORMAL HIGH (ref 71–180)

## 2019-12-03 ENCOUNTER — Ambulatory Visit (INDEPENDENT_AMBULATORY_CARE_PROVIDER_SITE_OTHER): Payer: Medicaid Other | Admitting: "Endocrinology

## 2019-12-03 ENCOUNTER — Encounter: Payer: Self-pay | Admitting: "Endocrinology

## 2019-12-03 ENCOUNTER — Other Ambulatory Visit: Payer: Self-pay

## 2019-12-03 DIAGNOSIS — E0590 Thyrotoxicosis, unspecified without thyrotoxic crisis or storm: Secondary | ICD-10-CM | POA: Diagnosis not present

## 2019-12-03 MED ORDER — METHIMAZOLE 10 MG PO TABS: 10.0000 mg | ORAL_TABLET | Freq: Three times a day (TID) | ORAL | 2 refills | Status: DC

## 2019-12-03 MED ORDER — PROPRANOLOL HCL 40 MG PO TABS: 20.0000 mg | ORAL_TABLET | Freq: Two times a day (BID) | ORAL | 2 refills | Status: DC

## 2019-12-03 NOTE — Progress Notes (Signed)
12/03/2019                                Endocrinology Telehealth Visit Follow up Note -During COVID -19 Pandemic  I connected with Peggy [name] on 12/03/2019   by telephone and verified that I am speaking with the correct person using two identifiers. Peggy [name], 06-27-1991. she has verbally consented to this visit. All issues noted in this document were discussed and addressed. The format was not optimal for physical exam.  Subjective:    Patient ID: Peggy [name], female    DOB: 1991/04/20.   Past Medical History:  Diagnosis Date  . Asthma   . Gestational diabetes   . Pyloric stenosis   . Thyroid disease    Graves    Past Surgical History:  Procedure Laterality Date  . PYLOROMYOTOMY      Social History   Socioeconomic History  . Marital status: Legally Separated    Spouse name: Hawa Henly  . Number of children: 2  . Years of education: Not on file  . Highest education level: Some college, no degree  Occupational History  . Not on file  Social Needs  . Financial resource strain: Not hard at all  . Food insecurity    Worry: Never true    Inability: Never true  . Transportation needs    Medical: No    Non-medical: No  Tobacco Use  . Smoking status: Current Every Day Smoker    Packs/day: 0.25    Years: 5.00    Pack years: 1.25    Types: Cigarettes  . Smokeless tobacco: Never Used  . Tobacco comment: 2 cig/day  Substance and Sexual Activity  . Alcohol use: No  . Drug use: No  . Sexual activity: Not Currently    Birth control/protection: I.U.D.  Lifestyle  . Physical activity    Days per week: 2 days    Minutes per session: 30 min  . Stress: To some extent  Relationships  . Social Herbalist on phone: Three times a week    Gets together: Once a week    Attends religious service: 1 to 4 times per year    Active member of club or organization: No    Attends meetings of clubs or organizations: Never    Relationship  status: Separated  Other Topics Concern  . Not on file  Social History Narrative  . Not on file    Family History  Problem Relation Age of Onset  . Arthritis Mother   . Cancer Mother        thyroid  . Depression Mother   . Hyperlipidemia Father   . Asthma Father   . Hypertension Father   . Heart disease Father   . COPD Maternal Grandfather   . Diabetes Paternal Grandmother   . Arthritis Paternal Grandmother   . Asthma Paternal Grandmother   . Heart disease Paternal Grandmother   . Depression Paternal Grandmother   . Hypertension Paternal Grandmother   . Hyperlipidemia Paternal Grandmother   . Other Neg Hx     Outpatient Encounter Medications as of 12/03/2019  Medication Sig  . acetaminophen (TYLENOL) 325 MG tablet Take 650 mg by mouth every 6 (six) hours as needed for mild pain or headache.  . albuterol (PROVENTIL) (2.5 MG/3ML) 0.083% nebulizer solution Take 3 mLs (2.5 mg total) by nebulization every 6 (six) hours as needed  for wheezing or shortness of breath.  Marland Kitchen albuterol (VENTOLIN HFA) 108 (90 Base) MCG/ACT inhaler TAKE 2 PUFFS BY MOUTH EVERY 6 HOURS AS NEEDED FOR WHEEZE OR SHORTNESS OF BREATH  . Levonorgestrel (KYLEENA) 19.5 MG IUD by Intrauterine route.  . methimazole (TAPAZOLE) 10 MG tablet Take 1 tablet (10 mg total) by mouth 3 (three) times daily.  . propranolol (INDERAL) 40 MG tablet Take 0.5 tablets (20 mg total) by mouth 2 (two) times daily.  . [DISCONTINUED] methimazole (TAPAZOLE) 10 MG tablet Take 10 mg by mouth 2 (two) times daily after a meal.  . [DISCONTINUED] predniSONE (DELTASONE) 20 MG tablet TAKE 1 TABLET BY MOUTH DAILY WITH BREAKFAST (Patient not taking: Reported on 10/21/2019)  . [DISCONTINUED] propranolol (INDERAL) 40 MG tablet Take 0.5 tablets (20 mg total) by mouth 2 (two) times daily.   No facility-administered encounter medications on file as of 12/03/2019.     ALLERGIES: No Known Allergies  VACCINATION STATUS: Immunization History  Administered  Date(s) Administered  . Influenza,inj,Quad PF,6+ Mos 12/11/2014, 09/23/2018  . MMR 12/12/2014  . Pneumococcal Polysaccharide-23 07/12/2012, 12/12/2014, 08/17/2017  . Tdap 12/11/2014, 06/02/2017, 02/18/2019     HPI  Peggy [name] is 28 y.o. female who presents today with a medical history as above. she is being engaged in telehealth via telephone for follow-up after she was seen in consultation for hyperthyroidism.   She is currently being treated with methimazole, propranolol, and short course of steroids due to severe hyperthyroidism.   -Thyroid uptake and scan and I-131 thyroid ablation were postponed due to the fact that she is breast-feeding currently.  Her son is 30-monthold.   -She reports improvement in her previous symptoms of palpitations, heat intolerance, tremors.  She did not weigh herself lately, although does not believe she lost anymore. -  She was initiated on methimazole 10 mg p.o. twice daily, propranolol 40 mg p.o. twice daily during her last visit.   -Her previsit thyroid function tests are still consistent with significant thyroid hormone burden. she denies dysphagia, choking, shortness of breath, no recent voice change.  she reports thyroid cancer in her mother who had to undergo thyroidectomy and what appears to be radioactive iodine thyroid remnant ablation.   she denies personal history of goiter. -She is open to attempt off breast-feeding in the next several weeks.                           Review of systems See the note from last visit.   Objective:    There were no vitals taken for this visit.  Wt Readings from Last 3 Encounters:  10/21/19 123 lb (55.8 kg)  08/31/19 125 lb (56.7 kg)  08/16/19 126 lb 1.7 oz (57.2 kg)                                                Physical exam  -See the note from last visit.   CMP     Component Value Date/Time   NA 134 (L) 08/17/2019 0004   K 3.9 08/17/2019 0004   CL 104 08/17/2019 0004   CO2 21 (L)  08/17/2019 0004   GLUCOSE 243 (H) 08/17/2019 0004   BUN 12 08/17/2019 0004   CREATININE 0.57 08/17/2019 0004   CALCIUM 9.3 08/17/2019 0004   PROT 8.2 (H) 10/08/2016 2239  ALBUMIN 4.2 10/08/2016 2239   AST 22 10/08/2016 2239   ALT 25 10/08/2016 2239   ALKPHOS 59 10/08/2016 2239   BILITOT 0.3 10/08/2016 2239   GFRNONAA >60 08/17/2019 0004   GFRAA >60 08/17/2019 0004     CBC    Component Value Date/Time   WBC 9.4 08/17/2019 0004   RBC 5.11 08/17/2019 0004   HGB 13.9 08/17/2019 0004   HGB 11.8 01/28/2019 0844   HCT 43.1 08/17/2019 0004   HCT 34.8 01/28/2019 0844   PLT 358 08/17/2019 0004   PLT 328 01/28/2019 0844   MCV 84.3 08/17/2019 0004   MCV 89 01/28/2019 0844   MCH 27.2 08/17/2019 0004   MCHC 32.3 08/17/2019 0004   RDW 12.6 08/17/2019 0004   RDW 12.7 01/28/2019 0844   LYMPHSABS 0.7 08/17/2019 0004   LYMPHSABS 2.7 09/25/2018 0847   MONOABS 0.1 08/17/2019 0004   EOSABS 0.0 08/17/2019 0004   EOSABS 0.3 09/25/2018 0847   BASOSABS 0.0 08/17/2019 0004   BASOSABS 0.0 09/25/2018 0847     Diabetic Labs (most recent): Lab Results  Component Value Date   HGBA1C 5.0 09/25/2018    Lab Results  Component Value Date   TSH <0.005 (L) 11/30/2019   TSH 0.01 (A) 09/27/2019   TSH 0.001 (L) 08/17/2019   FREET4 1.76 11/30/2019        Assessment & Plan:   1. Hyperthyroidism 2.  Graves' disease  -Her previsit thyroid function tests are consistent with slight improvement of the result of antithyroid medications.   -  She has had  high degree of antithyroid antibodies, indicating Graves' disease as the etiology.  Her labs also confirm high degree of thyroid hormone burden.  She is at risk of complications of untreated hyperthyroidism including thyroid storm.  The potential risks of untreated thyrotoxicosis and the need for definitive therapy have been discussed in detail with her, and she has partial response antithyroid treatment with methimazole    -Ideally, she would  need confirmatory thyroid uptake and scan in preparation for I-131 thyroid ablation, however, currently she is breast-feeding .  She is open to tapering off breast-feeding the next several weeks.   -She is advised to increase her methimazole to 10 mg p.o. 3 times daily, continue propranolol 40 mg p.o. twice daily, discontinue prednisone.   She will have repeat thyroid function test in 3 weeks and visit in 4 weeks.  If at that point she stops breast-feeding/pumping milk, she will be considered for thyroid uptake and scan and subsequent thyroid ablation with I-131.   Given her family history of thyroid malignancy, she will be considered for baseline thyroid ultrasound after her next visit.    - I advised her to maintain close follow up with her new PMD , Dr. Benny Lennert.    Time for this visit: 15 minutes. Peggy [name]  participated in the discussions, expressed understanding, and voiced agreement with the above plans.  All questions were answered to her satisfaction. she is encouraged to contact clinic should she have any questions or concerns prior to her return visit.   Follow up plan: Return in about 4 weeks (around 12/31/2019) for Follow up with Pre-visit Labs.   Thank you for involving me in the care of this pleasant patient, and I will continue to update you with her progress.  Glade Lloyd, MD [facility] Phone: [phone]  Fax: [phone]   12/03/2019, 11:36 AM  This note was partially dictated  with voice recognition software. Similar sounding words can be transcribed inadequately or may not  be corrected upon review.

## 2019-12-08 ENCOUNTER — Encounter: Payer: Self-pay | Admitting: Advanced Practice Midwife

## 2019-12-08 ENCOUNTER — Other Ambulatory Visit: Payer: Self-pay | Admitting: Women's Health

## 2019-12-08 MED ORDER — DICLOXACILLIN SODIUM 500 MG PO CAPS: 500.0000 mg | ORAL_CAPSULE | Freq: Four times a day (QID) | ORAL | 0 refills | Status: DC

## 2019-12-10 ENCOUNTER — Ambulatory Visit (HOSPITAL_COMMUNITY): Payer: Medicaid Other | Admitting: Physical Therapy

## 2020-01-04 ENCOUNTER — Ambulatory Visit (INDEPENDENT_AMBULATORY_CARE_PROVIDER_SITE_OTHER): Payer: Medicaid Other | Admitting: Advanced Practice Midwife

## 2020-01-04 ENCOUNTER — Ambulatory Visit: Payer: Medicaid Other | Admitting: "Endocrinology

## 2020-01-04 ENCOUNTER — Encounter: Payer: Self-pay | Admitting: Advanced Practice Midwife

## 2020-01-04 ENCOUNTER — Other Ambulatory Visit: Payer: Self-pay

## 2020-01-04 VITALS — BP 149/102 | HR 127 | Ht 60.0 in | Wt 116.5 lb

## 2020-01-04 DIAGNOSIS — E0590 Thyrotoxicosis, unspecified without thyrotoxic crisis or storm: Secondary | ICD-10-CM | POA: Diagnosis not present

## 2020-01-04 DIAGNOSIS — Z30431 Encounter for routine checking of intrauterine contraceptive device: Secondary | ICD-10-CM

## 2020-01-04 NOTE — Progress Notes (Signed)
History:  29 y.o. [id number] here today for today for IUD check IUD was placed October 2020.Has started having intermittent LLQ cramps since Sunday. Tried to check her strings and felt like her cx was lower.  Going to start pelvic floor PT next week.  Seeing MD about newly dx Grave's disease, has labs ordered at [facility], can't get there, so will reorder them for [facility].   Pulmonary: Negative for shortness of breath, dyspnea Cardiovascular: Negative for chest pain or palpitations  Gastrointestinal: Negative for vomiting, diarrhea and constipation. BMs are normal, but sometimes cramps feel like she has to have a BM.  Genitourinary: Negative for dysuria and urgency Musculoskeletal: Negative for back pain, joint pain, myalgias  Neurological: Negative for dizziness and headaches    Objective:  Physical Exam Blood pressure (!) 149/102, pulse (!) 127, height 5' (1.524 m), weight 116 lb 8 oz (52.8 kg), currently breastfeeding. Gen: NAD Abd: Soft, nontender and nondistended Pelvic: Bedside US reveals properly place IUD. Strings visible.    Assessment & Plan:  Normal IUD check. Cramps could be Mittleschmerz or ov cyst.  Could also be GI related  If persistent, will get pelvic US

## 2020-01-05 ENCOUNTER — Ambulatory Visit (HOSPITAL_COMMUNITY): Payer: Medicaid Other | Attending: Advanced Practice Midwife | Admitting: Physical Therapy

## 2020-01-05 ENCOUNTER — Encounter (HOSPITAL_COMMUNITY): Payer: Self-pay | Admitting: Physical Therapy

## 2020-01-05 DIAGNOSIS — M533 Sacrococcygeal disorders, not elsewhere classified: Secondary | ICD-10-CM | POA: Diagnosis not present

## 2020-01-05 DIAGNOSIS — M79604 Pain in right leg: Secondary | ICD-10-CM | POA: Insufficient documentation

## 2020-01-05 NOTE — Therapy (Signed)
[facility] [place], [place], [address] Phone: [phone]   Fax:  [phone]  Physical Therapy Evaluation  Patient Details  Name: Peggy [name] MRN: [id number] Date of Birth: 06-Sep-1991 Referring Provider (PT): Cresenzo-Dishmon   Encounter Date: 01/05/2020  PT End of Session - 01/05/20 1408    Visit Number  1    Number of Visits  8    Date for PT Re-Evaluation  02/02/20    Authorization Type  medicaid put in for 3 visits    Authorization Time Period  cert thru 0000000    Authorization - Visit Number  1    Authorization - Number of Visits  8    PT Start Time  1320    PT Stop Time  1400    PT Time Calculation (min)  40 min    Activity Tolerance  Patient tolerated treatment well       Past Medical History:  Diagnosis Date  . Asthma   . Gestational diabetes   . Pyloric stenosis   . Thyroid disease    Graves    Past Surgical History:  Procedure Laterality Date  . PYLOROMYOTOMY      There were no vitals filed for this visit.   Subjective Assessment - 01/05/20 1322    Subjective  [name] states that when she had her son eight months ago it caused nerve damage so her GYN wanted her to come to therapy.  The pt states that she is having pain in her pubic bone and in the groin of her right leg.  The pain comes and goes at this point it was constant.    Limitations  House hold activities    How long can you sit comfortably?  PT states that her tailbone hurts if she sits any longer than a few minutes; going sit to stand is painful    How long can you stand comfortably?  no problem    How long can you walk comfortably?  no problem    Patient Stated Goals  less pain    Currently in Pain?  Yes    Pain Score  4    worst pain 6   Pain Location  Pelvis    Pain Orientation  Right    Pain Descriptors / Indicators  Aching    Pain Type  Chronic pain    Pain Radiating Towards  groin    Pain Onset  More than a  month ago    Pain Frequency  Intermittent    Aggravating Factors   sitting; moving right leg    Pain Relieving Factors  deals with the pain    Effect of Pain on Daily Activities  deals with the pain         [facility] PT Assessment - 01/05/20 0001      Assessment   Medical Diagnosis  pelvic pain    Referring Provider (PT)  Cresenzo-Dishmon    Onset Date/Surgical Date  04/26/19    Prior Therapy  none      Precautions   Precautions  None      Restrictions   Weight Bearing Restrictions  No      Balance Screen   Has the patient fallen in the past 6 months  No    Has the patient had a decrease in activity level because of a fear of falling?   No    Is the patient reluctant to leave their home because  of a fear of falling?   No      Prior Function   Level of Independence  Independent    Vocation  Full time employment    Vocation Requirements  standing all day       Cognition   Overall Cognitive Status  Within Functional Limits for tasks assessed      ROM / Strength   AROM / PROM / Strength  --   all functional      Posture:  RT SI jt high, RT ASIS high.           Objective measurements completed on examination: See above findings.      [facility] PT Treatment/Exercise - 01/05/20 0001      Posture/Postural Control   Posture/Postural Control  Postural limitations    Posture Comments  Rt iliac crest high,  Rt PSIS low       Exercises   Exercises  Lumbar      Lumbar Exercises: Stretches   Single Knee to Chest Stretch  Right;3 reps      Lumbar Exercises: Supine   Single Leg Bridge  --   Lt x 3    Other Supine Lumbar Exercises  pelvic bone and tailbone pull together x 4; Sits bones together x 4; all four together              PT Education - 01/05/20 1407    Education Details  How SI dysfunction occurs in childbirth, EX    Person(s) Educated  Patient    Methods  Explanation;Handout    Comprehension  Verbalized understanding;Returned demonstration        PT Short Term Goals - 01/05/20 1420      PT SHORT TERM GOAL #1   Title  PT  to be experiencing pelvic pain one time a day.    Time  2    Period  Weeks    Status  New    Target Date  01/26/20      PT SHORT TERM GOAL #2   Title  PT to be able to sit for 20 minutes in comfort.    Time  2    Period  Weeks    Status  New        PT Long Term Goals - 01/05/20 1420      PT LONG TERM GOAL #1   Title  Pt to no longer be experiencing any pelvic or groin pain    Time  4    Period  Weeks    Status  New    Target Date  02/09/20      PT LONG TERM GOAL #2   Title  PT to be able to sit for an hour without any increased pain .    Time  4    Period  Weeks    Status  New      PT LONG TERM GOAL #3   Title  Pt to be I in HEP to allow proper alignment of her SI jt    Time  4    Period  Weeks    Status  New             Plan - 01/05/20 1409    Clinical Impression Statement  [name] is a 29 yo female who had her fourth child in April of 2020.  Ever since the delivery she has been experienceing pelvic and Rt groin pain.  She has currently been referred to  skilled PT.  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  [name] will benefit from skilled PT for mm energy techniques to correct her SI as well as strengthening exercises so that her SI stays in proper alignment.    Examination-Activity Limitations  Sit;Stairs    Clinical Decision Making  Low    Rehab Potential  Good    PT Frequency  2x / week    PT Duration  4 weeks    PT Treatment/Interventions  Manual techniques;Patient/family education;Therapeutic exercise    PT Next Visit Plan  check SI, mobilize as needed, pelvic isometrics, sitting Rt hip abdcution/extension    PT Home Exercise Plan  given RT knee to chest; Lt single leg bridge, "Sits" bones together, pelvic/tailbone together then all four    Consulted and Agree with Plan of Care  Patient       Patient will benefit from skilled therapeutic intervention in  order to improve the following deficits and impairments:  Pain, Postural dysfunction, Decreased strength  Visit Diagnosis: Pain in right leg - Plan: PT plan of care cert/re-cert  Sacroiliac dysfunction - Plan: PT plan of care cert/re-cert     Problem List Patient Active Problem List   Diagnosis Date Noted  . Encounter for initial prescription of intrauterine contraceptive device (IUD) 05/25/2019  . Depression screening 05/25/2019  . Encounter for postpartum visit 05/25/2019  . Indication for care in labor or delivery 04/26/2019  . Group beta Strep positive 04/26/2019  . SVD (spontaneous vaginal delivery) 04/26/2019  . History of gestational diabetes 10/28/2018  . Low vitamin D level 02/12/2017  . Smoker 07/25/2014   Rayetta Humphrey, PT CLT [phone] 01/05/2020, 2:27 PM  [facility] [address] [place], [place], [address] Phone: [phone]   Fax:  [phone]  Name: Peggy [name] MRN: [id number] Date of Birth: [birth year]

## 2020-01-06 ENCOUNTER — Other Ambulatory Visit: Payer: Self-pay | Admitting: Advanced Practice Midwife

## 2020-01-06 ENCOUNTER — Ambulatory Visit: Payer: Medicaid Other | Admitting: "Endocrinology

## 2020-01-06 ENCOUNTER — Encounter: Payer: Self-pay | Admitting: Advanced Practice Midwife

## 2020-01-06 DIAGNOSIS — Z3201 Encounter for pregnancy test, result positive: Secondary | ICD-10-CM

## 2020-01-06 DIAGNOSIS — E0590 Thyrotoxicosis, unspecified without thyrotoxic crisis or storm: Secondary | ICD-10-CM | POA: Diagnosis not present

## 2020-01-06 NOTE — Progress Notes (Signed)
qhc

## 2020-01-07 DIAGNOSIS — E0590 Thyrotoxicosis, unspecified without thyrotoxic crisis or storm: Secondary | ICD-10-CM | POA: Diagnosis not present

## 2020-01-07 LAB — T4: T4, Total: 8.9 ug/dL (ref 4.5–12.0)

## 2020-01-07 LAB — T4, FREE: Free T4: 1.64 ng/dL (ref 0.82–1.77)

## 2020-01-07 LAB — TSH: TSH: <0.005 u[IU]/mL — ABNORMAL LOW (ref 0.450–4.500)

## 2020-01-08 LAB — T3, FREE: T3, Free: 8.6 pg/mL — ABNORMAL HIGH (ref 2.0–4.4)

## 2020-01-08 LAB — T3: T3, Total: 285 ng/dL — ABNORMAL HIGH (ref 71–180)

## 2020-01-10 ENCOUNTER — Encounter: Payer: Self-pay | Admitting: "Endocrinology

## 2020-01-10 ENCOUNTER — Other Ambulatory Visit: Payer: Self-pay | Admitting: Advanced Practice Midwife

## 2020-01-10 ENCOUNTER — Telehealth (HOSPITAL_COMMUNITY): Payer: Self-pay | Admitting: Physical Therapy

## 2020-01-10 ENCOUNTER — Ambulatory Visit (INDEPENDENT_AMBULATORY_CARE_PROVIDER_SITE_OTHER): Payer: Medicaid Other | Admitting: "Endocrinology

## 2020-01-10 ENCOUNTER — Encounter: Payer: Self-pay | Admitting: Advanced Practice Midwife

## 2020-01-10 DIAGNOSIS — E0590 Thyrotoxicosis, unspecified without thyrotoxic crisis or storm: Secondary | ICD-10-CM | POA: Insufficient documentation

## 2020-01-10 DIAGNOSIS — Z3201 Encounter for pregnancy test, result positive: Secondary | ICD-10-CM

## 2020-01-10 MED ORDER — PREDNISONE 20 MG PO TABS: 20.0000 mg | ORAL_TABLET | Freq: Every day | ORAL | 0 refills | Status: DC

## 2020-01-10 NOTE — Patient Instructions (Signed)
She stopped her methimazole since 01/03/20

## 2020-01-10 NOTE — Telephone Encounter (Signed)
L/m to cx this 01/11/2020 with CR - she will not be in the office.

## 2020-01-10 NOTE — Progress Notes (Signed)
01/10/2020                                Endocrinology Telehealth Visit Follow up Note -During COVID -19 Pandemic  I connected with Peggy [name] on 01/10/2020   by telephone and verified that I am speaking with the correct person using two identifiers. Peggy [name], May 12, 1991. she has verbally consented to this visit. All issues noted in this document were discussed and addressed. The format was not optimal for physical exam.  Subjective:    Patient ID: Peggy [name], female    DOB: 15-Dec-1991.   Past Medical History:  Diagnosis Date  . Asthma   . Gestational diabetes   . Pyloric stenosis   . Thyroid disease    Graves    Past Surgical History:  Procedure Laterality Date  . PYLOROMYOTOMY      Social History   Socioeconomic History  . Marital status: Legally Separated    Spouse name: Lewanda Perea  . Number of children: 2  . Years of education: Not on file  . Highest education level: Some college, no degree  Occupational History  . Not on file  Tobacco Use  . Smoking status: Current Every Day Smoker    Packs/day: 0.25    Years: 5.00    Pack years: 1.25    Types: Cigarettes  . Smokeless tobacco: Never Used  . Tobacco comment: 2 cig/day  Substance and Sexual Activity  . Alcohol use: No  . Drug use: No  . Sexual activity: Yes    Birth control/protection: I.U.D.  Other Topics Concern  . Not on file  Social History Narrative  . Not on file   Social Determinants of Health   Financial Resource Strain:   . Difficulty of Paying Living Expenses: Not on file  Food Insecurity:   . Worried About Charity fundraiser in the Last Year: Not on file  . Ran Out of Food in the Last Year: Not on file  Transportation Needs:   . Lack of Transportation (Medical): Not on file  . Lack of Transportation (Non-Medical): Not on file  Physical Activity:   . Days of Exercise per Week: Not on file  . Minutes of Exercise per Session: Not on file  Stress:   . Feeling  of Stress : Not on file  Social Connections:   . Frequency of Communication with Friends and Family: Not on file  . Frequency of Social Gatherings with Friends and Family: Not on file  . Attends Religious Services: Not on file  . Active Member of Clubs or Organizations: Not on file  . Attends Archivist Meetings: Not on file  . Marital Status: Not on file    Family History  Problem Relation Age of Onset  . Arthritis Mother   . Cancer Mother        thyroid  . Depression Mother   . Hyperlipidemia Father   . Asthma Father   . Hypertension Father   . Heart disease Father   . COPD Maternal Grandfather   . Diabetes Paternal Grandmother   . Arthritis Paternal Grandmother   . Asthma Paternal Grandmother   . Heart disease Paternal Grandmother   . Depression Paternal Grandmother   . Hypertension Paternal Grandmother   . Hyperlipidemia Paternal Grandmother   . Other Neg Hx     Outpatient Encounter Medications as of 01/10/2020  Medication Sig  .  acetaminophen (TYLENOL) 325 MG tablet Take 650 mg by mouth every 6 (six) hours as needed for mild pain or headache.  . albuterol (VENTOLIN HFA) 108 (90 Base) MCG/ACT inhaler TAKE 2 PUFFS BY MOUTH EVERY 6 HOURS AS NEEDED FOR WHEEZE OR SHORTNESS OF BREATH  . Levonorgestrel (KYLEENA) 19.5 MG IUD by Intrauterine route.  . methimazole (TAPAZOLE) 10 MG tablet Take 1 tablet (10 mg total) by mouth 3 (three) times daily.  . predniSONE (DELTASONE) 20 MG tablet Take 1 tablet (20 mg total) by mouth daily with breakfast.  . propranolol (INDERAL) 40 MG tablet Take 0.5 tablets (20 mg total) by mouth 2 (two) times daily.   No facility-administered encounter medications on file as of 01/10/2020.    ALLERGIES: No Known Allergies  VACCINATION STATUS: Immunization History  Administered Date(s) Administered  . Influenza,inj,Quad PF,6+ Mos 12/11/2014, 09/23/2018  . MMR 12/12/2014  . Pneumococcal Polysaccharide-23 07/12/2012, 12/12/2014, 08/17/2017   . Tdap 12/11/2014, 06/02/2017, 02/18/2019     HPI  Peggy [name] is 29 y.o. female who presents today with a medical history as above. she is being engaged in telehealth via telephone for follow-up after she was seen in consultation for hyperthyroidism.   She is currently being treated with methimazole 10 mg p.o. 3 times daily, propranolol 20 mg p.o. twice daily, and status post short course of steroids due to severe hyperthyroidism.  This treatment regimen was due to the fact that she decided to keep breast-feeding her young child who is now 88-monthold.  -Thyroid uptake and scan and I-131 thyroid ablation were postponed due to the fact that she is breast-feeding currently.  -She has now stopped breast-feeding her son. -She reports slight improvement in her previous symptoms of palpitations, heat intolerance, tremors.  She did not weigh herself lately, although does not believe she lost anymore.  -Her previsit thyroid function tests are still consistent with significant thyroid hormone burden. she denies dysphagia, choking, shortness of breath, no recent voice change.  she reports thyroid cancer in her mother who had to undergo thyroidectomy and what appears to be radioactive iodine thyroid remnant ablation.   she denies personal history of goiter. -She is open to attempt off breast-feeding in the next several weeks.                           Review of systems See the note from last visit.   Objective:    There were no vitals taken for this visit.  Wt Readings from Last 3 Encounters:  01/04/20 116 lb 8 oz (52.8 kg)  10/21/19 123 lb (55.8 kg)  08/31/19 125 lb (56.7 kg)                                                Physical exam  -See the note from last visit.   CMP     Component Value Date/Time   NA 134 (L) 08/17/2019 0004   K 3.9 08/17/2019 0004   CL 104 08/17/2019 0004   CO2 21 (L) 08/17/2019 0004   GLUCOSE 243 (H) 08/17/2019 0004   BUN 12 08/17/2019 0004    CREATININE 0.57 08/17/2019 0004   CALCIUM 9.3 08/17/2019 0004   PROT 8.2 (H) 10/08/2016 2239   ALBUMIN 4.2 10/08/2016 2239   AST 22 10/08/2016 2239   ALT 25 10/08/2016  2239   ALKPHOS 59 10/08/2016 2239   BILITOT 0.3 10/08/2016 2239   GFRNONAA >60 08/17/2019 0004   GFRAA >60 08/17/2019 0004    CBC    Component Value Date/Time   WBC 9.4 08/17/2019 0004   RBC 5.11 08/17/2019 0004   HGB 13.9 08/17/2019 0004   HGB 11.8 01/28/2019 0844   HCT 43.1 08/17/2019 0004   HCT 34.8 01/28/2019 0844   PLT 358 08/17/2019 0004   PLT 328 01/28/2019 0844   MCV 84.3 08/17/2019 0004   MCV 89 01/28/2019 0844   MCH 27.2 08/17/2019 0004   MCHC 32.3 08/17/2019 0004   RDW 12.6 08/17/2019 0004   RDW 12.7 01/28/2019 0844   LYMPHSABS 0.7 08/17/2019 0004   LYMPHSABS 2.7 09/25/2018 0847   MONOABS 0.1 08/17/2019 0004   EOSABS 0.0 08/17/2019 0004   EOSABS 0.3 09/25/2018 0847   BASOSABS 0.0 08/17/2019 0004   BASOSABS 0.0 09/25/2018 0847     Diabetic Labs (most recent): Lab Results  Component Value Date   HGBA1C 5.0 09/25/2018    Lab Results  Component Value Date   TSH <0.005 (L) 01/06/2020   TSH <0.005 (L) 11/30/2019   TSH 0.01 (A) 09/27/2019   TSH 0.001 (L) 08/17/2019   FREET4 1.64 01/06/2020   FREET4 1.76 11/30/2019        Assessment & Plan:   1. Hyperthyroidism 2.  Graves' disease  -Her previsit thyroid function tests are consistent with slight improvement of the result of antithyroid medications.   -  She has had  high degree of antithyroid antibodies, indicating Graves' disease as the etiology.  Her previsit labs still confirm high degree of thyroid hormone burden.  -  She is at risk of complications of untreated hyperthyroidism including thyroid storm.  The potential risks of untreated thyrotoxicosis and the need for definitive therapy have been discussed in detail with her, and she has partial response antithyroid treatment with methimazole    -Ideally, she will need  confirmatory thyroid uptake and scan in preparation for I-131 thyroid ablation. This scan will be requested to be done as soon as possible to prepare her for treatment with I-131 in the next few days.  -She is advised to hold her methimazole for the scan and treatment with I-131, advised to continue propanolol 20 mg p.o. twice daily, prescribed a new course of prednisone 20 mg p.o. daily for 15 days.    - I advised her to maintain close follow up with her new PMD , Dr. Benny Lennert.       - Time spent on this patient care encounter:  15 minutes of which 50% was spent in  counseling and the rest reviewing  her current and  previous labs / studies and medications  doses and developing a plan for long term care. Peggy [name]  participated in the discussions, expressed understanding, and voiced agreement with the above plans.  All questions were answered to her satisfaction. she is encouraged to contact clinic should she have any questions or concerns prior to her return visit.  Follow up plan: Return in about 1 week (around 01/17/2020) for Follow up with Thyroid Uptake and Scan.   Thank you for involving me in the care of this pleasant patient, and I will continue to update you with her progress.  Glade Lloyd, MD [facility] Endocrinology [place] [facility] Phone: [phone]  Fax: [phone]   01/10/2020, 9:36 PM  This note was partially dictated with voice recognition software.  Similar sounding words can be transcribed inadequately or may not  be corrected upon review.

## 2020-01-11 ENCOUNTER — Ambulatory Visit: Payer: Medicaid Other | Admitting: "Endocrinology

## 2020-01-11 ENCOUNTER — Ambulatory Visit (HOSPITAL_COMMUNITY): Payer: Medicaid Other | Admitting: Physical Therapy

## 2020-01-11 DIAGNOSIS — Z3201 Encounter for pregnancy test, result positive: Secondary | ICD-10-CM | POA: Diagnosis not present

## 2020-01-12 ENCOUNTER — Telehealth (HOSPITAL_COMMUNITY): Payer: Self-pay | Admitting: Physical Therapy

## 2020-01-12 ENCOUNTER — Other Ambulatory Visit: Payer: Self-pay | Admitting: Advanced Practice Midwife

## 2020-01-12 ENCOUNTER — Telehealth: Payer: Self-pay | Admitting: *Deleted

## 2020-01-12 ENCOUNTER — Other Ambulatory Visit: Payer: Self-pay | Admitting: "Endocrinology

## 2020-01-12 DIAGNOSIS — E0590 Thyrotoxicosis, unspecified without thyrotoxic crisis or storm: Secondary | ICD-10-CM

## 2020-01-12 LAB — BETA HCG QUANT (REF LAB): hCG Quant: <1 m[IU]/mL

## 2020-01-12 MED ORDER — NAPROXEN 500 MG PO TABS: 500.0000 mg | ORAL_TABLET | Freq: Two times a day (BID) | ORAL | 1 refills | Status: DC

## 2020-01-12 NOTE — Progress Notes (Signed)
naproxyn for cramps

## 2020-01-12 NOTE — Telephone Encounter (Signed)
Patient left message on nurse line that she was referred to rehab for pelvic floor therapy but her appointments have been canceled.  Would like a call back.   LMOVM that I see someone from there office called her to let her know the therapist would be out of the office.  At this time, we do not have any other places that we refer to for this type of therapy.  Advised to call their office back to see when she will be able to return.

## 2020-01-12 NOTE — Telephone Encounter (Signed)
pt was informed that these appts were cancelled because the therapist would not be in the office. Pt was very unhappy about the situation. I offered her to speak to the office manager on tomorrow and she hung up

## 2020-01-13 ENCOUNTER — Ambulatory Visit (HOSPITAL_COMMUNITY): Payer: Medicaid Other | Admitting: Physical Therapy

## 2020-01-13 ENCOUNTER — Other Ambulatory Visit: Payer: Self-pay | Admitting: Advanced Practice Midwife

## 2020-01-13 ENCOUNTER — Encounter (HOSPITAL_COMMUNITY): Payer: Self-pay | Admitting: Advanced Practice Midwife

## 2020-01-13 ENCOUNTER — Encounter: Payer: Self-pay | Admitting: Advanced Practice Midwife

## 2020-01-13 DIAGNOSIS — M6289 Other specified disorders of muscle: Secondary | ICD-10-CM

## 2020-01-13 NOTE — Progress Notes (Unsigned)
Pt referred to [facility] outpt PT per request to go somewhere besides [place]. Note from [facility] said pt didn't want to come there d/t distance.  Pt may contact [facility] again and reschedule.

## 2020-01-13 NOTE — Progress Notes (Signed)
Ambulatory for pelvic PT sent to [facility] per request

## 2020-01-17 ENCOUNTER — Other Ambulatory Visit: Payer: Medicaid Other

## 2020-01-18 ENCOUNTER — Other Ambulatory Visit: Payer: Self-pay

## 2020-01-18 ENCOUNTER — Other Ambulatory Visit: Payer: Self-pay | Admitting: Advanced Practice Midwife

## 2020-01-18 ENCOUNTER — Ambulatory Visit (HOSPITAL_COMMUNITY): Payer: Medicaid Other | Admitting: Physical Therapy

## 2020-01-18 ENCOUNTER — Ambulatory Visit (INDEPENDENT_AMBULATORY_CARE_PROVIDER_SITE_OTHER): Payer: Medicaid Other | Admitting: "Endocrinology

## 2020-01-18 ENCOUNTER — Encounter (HOSPITAL_COMMUNITY): Payer: Medicaid Other | Admitting: Physical Therapy

## 2020-01-18 ENCOUNTER — Encounter: Payer: Self-pay | Admitting: "Endocrinology

## 2020-01-18 DIAGNOSIS — E0590 Thyrotoxicosis, unspecified without thyrotoxic crisis or storm: Secondary | ICD-10-CM

## 2020-01-18 DIAGNOSIS — R1032 Left lower quadrant pain: Secondary | ICD-10-CM

## 2020-01-18 NOTE — Progress Notes (Signed)
01/18/2020                                Endocrinology Telehealth Visit Follow up Note -During COVID -19 Pandemic  I connected with Peggy [name] on 01/18/2020   by telephone and verified that I am speaking with the correct person using two identifiers. Peggy [name], October 12, 1991. she has verbally consented to this visit. All issues noted in this document were discussed and addressed. The format was not optimal for physical exam.  Subjective:    Patient ID: Peggy [name], female    DOB: 1991/09/20.   Past Medical History:  Diagnosis Date  . Asthma   . Gestational diabetes   . Pyloric stenosis   . Thyroid disease    Graves    Past Surgical History:  Procedure Laterality Date  . PYLOROMYOTOMY      Social History   Socioeconomic History  . Marital status: Legally Separated    Spouse name: Amneet Cendejas  . Number of children: 2  . Years of education: Not on file  . Highest education level: Some college, no degree  Occupational History  . Not on file  Tobacco Use  . Smoking status: Current Every Day Smoker    Packs/day: 0.25    Years: 5.00    Pack years: 1.25    Types: Cigarettes  . Smokeless tobacco: Never Used  . Tobacco comment: 2 cig/day  Substance and Sexual Activity  . Alcohol use: No  . Drug use: No  . Sexual activity: Yes    Birth control/protection: I.U.D.  Other Topics Concern  . Not on file  Social History Narrative  . Not on file   Social Determinants of Health   Financial Resource Strain:   . Difficulty of Paying Living Expenses: Not on file  Food Insecurity:   . Worried About Charity fundraiser in the Last Year: Not on file  . Ran Out of Food in the Last Year: Not on file  Transportation Needs:   . Lack of Transportation (Medical): Not on file  . Lack of Transportation (Non-Medical): Not on file  Physical Activity:   . Days of Exercise per Week: Not on file  . Minutes of Exercise per Session: Not on file  Stress:   . Feeling  of Stress : Not on file  Social Connections:   . Frequency of Communication with Friends and Family: Not on file  . Frequency of Social Gatherings with Friends and Family: Not on file  . Attends Religious Services: Not on file  . Active Member of Clubs or Organizations: Not on file  . Attends Archivist Meetings: Not on file  . Marital Status: Not on file    Family History  Problem Relation Age of Onset  . Arthritis Mother   . Cancer Mother        thyroid  . Depression Mother   . Hyperlipidemia Father   . Asthma Father   . Hypertension Father   . Heart disease Father   . COPD Maternal Grandfather   . Diabetes Paternal Grandmother   . Arthritis Paternal Grandmother   . Asthma Paternal Grandmother   . Heart disease Paternal Grandmother   . Depression Paternal Grandmother   . Hypertension Paternal Grandmother   . Hyperlipidemia Paternal Grandmother   . Other Neg Hx     Outpatient Encounter Medications as of 01/18/2020  Medication Sig  .  acetaminophen (TYLENOL) 325 MG tablet Take 650 mg by mouth every 6 (six) hours as needed for mild pain or headache.  . albuterol (VENTOLIN HFA) 108 (90 Base) MCG/ACT inhaler TAKE 2 PUFFS BY MOUTH EVERY 6 HOURS AS NEEDED FOR WHEEZE OR SHORTNESS OF BREATH  . Levonorgestrel (KYLEENA) 19.5 MG IUD by Intrauterine route.  . methimazole (TAPAZOLE) 10 MG tablet Take 1 tablet (10 mg total) by mouth 3 (three) times daily.  . naproxen (NAPROSYN) 500 MG tablet Take 1 tablet (500 mg total) by mouth 2 (two) times daily with a meal.  . predniSONE (DELTASONE) 20 MG tablet Take 1 tablet (20 mg total) by mouth daily with breakfast.  . propranolol (INDERAL) 40 MG tablet Take 0.5 tablets (20 mg total) by mouth 2 (two) times daily.   No facility-administered encounter medications on file as of 01/18/2020.    ALLERGIES: No Known Allergies  VACCINATION STATUS: Immunization History  Administered Date(s) Administered  . Influenza,inj,Quad PF,6+ Mos  12/11/2014, 09/23/2018  . MMR 12/12/2014  . Pneumococcal Polysaccharide-23 07/12/2012, 12/12/2014, 08/17/2017  . Tdap 12/11/2014, 06/02/2017, 02/18/2019     HPI  Peggy [name] is 29 y.o. female who presents today with a medical history as above. she is scheduled to have her thyroid uptake and scan on January 21 and 22.  She is off of her methimazole at this time.  She called in complaining of some nonspecific hives, and blurry vision.  She denies nausea, vomiting, abdominal pain.  She has not been consistent taking her propranolol which was supposed to be 40 mg p.o. twice daily.  She is also supposed to be on prednisone 20 mg p.o. daily in preparation before her exposure to I-131.   -Her thyroid uptake and scan and I-131 thyroid ablation was delayed due to the fact that she decided to keep breast-feeding her young child who is now 69-monthold.  She stopped breast-feeding 2 weeks ago.   She did not weigh herself lately, although does not believe she lost anymore.  -Her recent previsit thyroid function tests are still consistent with significant thyroid hormone burden. she denies dysphagia, choking, shortness of breath, no recent voice change.  she reports thyroid cancer in her mother who had to undergo thyroidectomy and what appears to be radioactive iodine thyroid remnant ablation.   she denies personal history of goiter. -She is open to attempt off breast-feeding in the next several weeks.                           Review of systems See the note from last visit.   Objective:    There were no vitals taken for this visit.  Wt Readings from Last 3 Encounters:  01/04/20 116 lb 8 oz (52.8 kg)  10/21/19 123 lb (55.8 kg)  08/31/19 125 lb (56.7 kg)                                                Physical exam  -See the note from last visit.   CMP     Component Value Date/Time   NA 134 (L) 08/17/2019 0004   K 3.9 08/17/2019 0004   CL 104 08/17/2019 0004   CO2 21 (L)  08/17/2019 0004   GLUCOSE 243 (H) 08/17/2019 0004   BUN 12 08/17/2019 0004   CREATININE 0.57 08/17/2019  0004   CALCIUM 9.3 08/17/2019 0004   PROT 8.2 (H) 10/08/2016 2239   ALBUMIN 4.2 10/08/2016 2239   AST 22 10/08/2016 2239   ALT 25 10/08/2016 2239   ALKPHOS 59 10/08/2016 2239   BILITOT 0.3 10/08/2016 2239   GFRNONAA >60 08/17/2019 0004   GFRAA >60 08/17/2019 0004    CBC    Component Value Date/Time   WBC 9.4 08/17/2019 0004   RBC 5.11 08/17/2019 0004   HGB 13.9 08/17/2019 0004   HGB 11.8 01/28/2019 0844   HCT 43.1 08/17/2019 0004   HCT 34.8 01/28/2019 0844   PLT 358 08/17/2019 0004   PLT 328 01/28/2019 0844   MCV 84.3 08/17/2019 0004   MCV 89 01/28/2019 0844   MCH 27.2 08/17/2019 0004   MCHC 32.3 08/17/2019 0004   RDW 12.6 08/17/2019 0004   RDW 12.7 01/28/2019 0844   LYMPHSABS 0.7 08/17/2019 0004   LYMPHSABS 2.7 09/25/2018 0847   MONOABS 0.1 08/17/2019 0004   EOSABS 0.0 08/17/2019 0004   EOSABS 0.3 09/25/2018 0847   BASOSABS 0.0 08/17/2019 0004   BASOSABS 0.0 09/25/2018 0847     Diabetic Labs (most recent): Lab Results  Component Value Date   HGBA1C 5.0 09/25/2018    Lab Results  Component Value Date   TSH <0.005 (L) 01/06/2020   TSH <0.005 (L) 11/30/2019   TSH 0.01 (A) 09/27/2019   TSH 0.001 (L) 08/17/2019   FREET4 1.64 01/06/2020   FREET4 1.76 11/30/2019        Assessment & Plan:   1. Hyperthyroidism 2.  Graves' disease   -  She has had  high degree of antithyroid antibodies, indicating Graves' disease as the etiology.  Her previsit labs still confirm high degree of thyroid hormone burden.  Her complaints are nonspecific, advised to be consistent with her prednisone 20 mg p.o. daily and propanolol 40 mg p.o. twice daily. -  She is at risk of complications of untreated hyperthyroidism including thyroid storm.  The potential risks of untreated thyrotoxicosis and the need for definitive therapy have been discussed in detail with her, and she has  partial response antithyroid treatment with methimazole   She is in preparation for thyroid uptake and scan before I-131 thyroid ablation. This scan is scheduled to be done in [facility] in [place] for January 21 and 22.    -She is advised to stay off of methimazole for that scan.   -She will be contacted right after her scan is reported.     - I advised her to maintain close follow up with her new PMD , Dr. Benny Lennert.     - Time spent on this patient care encounter:  20 minutes of which 50% was spent in  counseling and the rest reviewing  her current and  previous labs / studies and medications  doses and developing a plan for long term care. Peggy [name]  participated in the discussions, expressed understanding, and voiced agreement with the above plans.  All questions were answered to her satisfaction. she is encouraged to contact clinic should she have any questions or concerns prior to her return visit.   Follow up plan: Return keep her regular appointment after her scan.   Thank you for involving me in the care of this pleasant patient, and I will continue to update you with her progress.  Glade Lloyd, MD [facility] Endocrinology [place] [facility] Phone: [phone]  Fax: [phone]   01/18/2020, 4:43 PM  This note was partially dictated with voice recognition software. Similar sounding words can be transcribed inadequately or may not  be corrected upon review.

## 2020-01-19 ENCOUNTER — Other Ambulatory Visit: Payer: Self-pay

## 2020-01-19 ENCOUNTER — Other Ambulatory Visit (HOSPITAL_COMMUNITY)
Admission: RE | Admit: 2020-01-19 | Discharge: 2020-01-19 | Disposition: A | Discharge status: Home / Self Care | Payer: Medicaid Other | Source: Ambulatory Visit | Attending: Obstetrics and Gynecology | Admitting: Obstetrics and Gynecology

## 2020-01-19 ENCOUNTER — Ambulatory Visit (INDEPENDENT_AMBULATORY_CARE_PROVIDER_SITE_OTHER): Payer: Medicaid Other

## 2020-01-19 ENCOUNTER — Encounter: Payer: Self-pay | Admitting: Obstetrics and Gynecology

## 2020-01-19 ENCOUNTER — Ambulatory Visit (INDEPENDENT_AMBULATORY_CARE_PROVIDER_SITE_OTHER): Payer: Medicaid Other | Admitting: Obstetrics and Gynecology

## 2020-01-19 VITALS — BP 129/78 | HR 97 | Ht 60.0 in | Wt 120.4 lb

## 2020-01-19 DIAGNOSIS — R1032 Left lower quadrant pain: Secondary | ICD-10-CM

## 2020-01-19 DIAGNOSIS — R102 Pelvic and perineal pain: Secondary | ICD-10-CM | POA: Diagnosis not present

## 2020-01-19 DIAGNOSIS — Z113 Encounter for screening for infections with a predominantly sexual mode of transmission: Secondary | ICD-10-CM

## 2020-01-19 NOTE — Progress Notes (Signed)
[facility] Clinic Visit  @DATE @            Patient name: CATHER TORGERSON MRN [id number]  Date of birth: [birth year]  CC & HPI:  IDELLAR STOCKS is a 29 y.o. female presenting today for follow-up of steady continue with intermittent pelvic discomfort.  See the notes with Nigel Berthold who seen the patient several times.  She was referred for pelvic floor therapy and has not been able to keep those appointments  ROS:  ROS   Pertinent History Reviewed:   Reviewed: Significant for gravida 4 para 4 now 8 months status post most recent delivery.  IUD placed postpartum she is having no bleeding associated with it ultrasound today is essentially benign Medical         Past Medical History:  Diagnosis Date  . Asthma   . Gestational diabetes   . Pyloric stenosis   . Thyroid disease    Graves                              Surgical Hx:    Past Surgical History:  Procedure Laterality Date  . PYLOROMYOTOMY     Medications: Reviewed & Updated - see associated section                       Current Outpatient Medications:  .  acetaminophen (TYLENOL) 325 MG tablet, Take 650 mg by mouth every 6 (six) hours as needed for mild pain or headache., Disp: , Rfl:  .  albuterol (VENTOLIN HFA) 108 (90 Base) MCG/ACT inhaler, TAKE 2 PUFFS BY MOUTH EVERY 6 HOURS AS NEEDED FOR WHEEZE OR SHORTNESS OF BREATH, Disp: 17 g, Rfl: 2 .  Levonorgestrel (KYLEENA) 19.5 MG IUD, by Intrauterine route., Disp: , Rfl:  .  methimazole (TAPAZOLE) 10 MG tablet, Take 1 tablet (10 mg total) by mouth 3 (three) times daily., Disp: 90 tablet, Rfl: 2 .  naproxen (NAPROSYN) 500 MG tablet, Take 1 tablet (500 mg total) by mouth 2 (two) times daily with a meal., Disp: 30 tablet, Rfl: 1 .  predniSONE (DELTASONE) 20 MG tablet, Take 1 tablet (20 mg total) by mouth daily with breakfast., Disp: 15 tablet, Rfl: 0 .  propranolol (INDERAL) 40 MG tablet, Take 0.5 tablets (20 mg total) by mouth 2 (two) times daily., Disp: 60  tablet, Rfl: 2   Social History: Reviewed -  reports that she has been smoking cigarettes. She has a 1.25 pack-year smoking history. She has never used smokeless tobacco.  Objective Findings:  Vitals: Blood pressure 129/78, pulse 97, height 5' (1.524 m), weight 120 lb 6.4 oz (54.6 kg), currently breastfeeding.  PHYSICAL EXAMINATION General appearance - alert, well appearing, and in no distress, oriented to person, place, and time and normal appearing weight Mental status - alert, oriented to person, place, and time, normal mood, behavior, speech, dress, motor activity, and thought processes Chest -  Heart - normal rate and regular rhythm Abdomen -nontender abdomen   breasts -  Skin - normal coloration and turgor, no rashes, no suspicious skin lesions noted  PELVIC External genitalia -normal multipara good support Vulva -normal good support Vagina -good muscle tone muscle contraction strength good levator support Cervix -nonpurulent IUD string appropriate to centimeter length  Uterus -anteflexed resting over the uterus nontender to bimanual  Adnexa -uterus deviated just slightly to the right.  Nontender adnexa even though patient states that the  discomfort is just to the left of the midline [employer] -not applicable Rectal - rectocele noted very mild prominence, massage lightly over the bowel which is rather full even though she had a bowel movement this morning seems to reproduce the DISCOMFORT    Technician Comments:  PELVIC US TA/TV:homogeneous anteverted uterus,wnl,IUD is centrally located within the endometrium,EEC 2.3 mm,normal right ovary,small echogenic mass left ovary .9 x .9  x  .9 cm (? Dermoid),hemorrhagic dominate follicle left ovary 1.9 x 1.7 x 1.3 cm,no free fluid,ovaries appear mobile,no pain during ultrasound  Chaperone [address] Heide Guile 01/19/2020 2:42 PM  Assessment & Plan:   A:  1. Pelvic discomfort likely due to bowel fullness and pressure  P:   1. Begin with MiraLAX twice daily or as necessary to maintain regular loose stools.  If this does not work would consider trying Linzess

## 2020-01-19 NOTE — Progress Notes (Addendum)
PELVIC US TA/TV:homogeneous anteverted uterus,wnl,IUD is centrally located within the endometrium,EEC 2.3 mm,normal right ovary,small echogenic mass left ovary .9 x .9  x  .9 cm (? Dermoid),hemorrhagic dominate follicle left ovary 1.9 x 1.7 x 1.3 cm,no free fluid,ovaries appear mobile,no pain during ultrasound  Chaperone [place]

## 2020-01-20 ENCOUNTER — Telehealth: Payer: Self-pay | Admitting: "Endocrinology

## 2020-01-20 ENCOUNTER — Encounter: Payer: Self-pay | Admitting: "Endocrinology

## 2020-01-20 ENCOUNTER — Encounter (HOSPITAL_COMMUNITY): Payer: Medicaid Other | Admitting: Physical Therapy

## 2020-01-20 DIAGNOSIS — E0590 Thyrotoxicosis, unspecified without thyrotoxic crisis or storm: Secondary | ICD-10-CM | POA: Diagnosis not present

## 2020-01-20 NOTE — Telephone Encounter (Signed)
I spoke with Peggy [name] and the patient, she will have results tomorrow. Make sure she is on schedule for tomorrow.

## 2020-01-20 NOTE — Telephone Encounter (Signed)
Denise at [employer] [facility] called about her labs. She wants a call at [phone]

## 2020-01-21 ENCOUNTER — Ambulatory Visit: Payer: Medicaid Other | Admitting: "Endocrinology

## 2020-01-21 DIAGNOSIS — E0590 Thyrotoxicosis, unspecified without thyrotoxic crisis or storm: Secondary | ICD-10-CM | POA: Diagnosis not present

## 2020-01-24 ENCOUNTER — Encounter: Payer: Self-pay | Admitting: "Endocrinology

## 2020-01-24 ENCOUNTER — Telehealth: Payer: Self-pay | Admitting: "Endocrinology

## 2020-01-24 ENCOUNTER — Ambulatory Visit (INDEPENDENT_AMBULATORY_CARE_PROVIDER_SITE_OTHER): Payer: Medicaid Other | Admitting: "Endocrinology

## 2020-01-24 DIAGNOSIS — E0590 Thyrotoxicosis, unspecified without thyrotoxic crisis or storm: Secondary | ICD-10-CM | POA: Diagnosis not present

## 2020-01-24 DIAGNOSIS — E0500 Thyrotoxicosis with diffuse goiter without thyrotoxic crisis or storm: Secondary | ICD-10-CM | POA: Diagnosis not present

## 2020-01-24 NOTE — Progress Notes (Signed)
01/24/2020                                Endocrinology Telehealth Visit Follow up Note -During COVID -19 Pandemic  I connected with Peggy [name] on 01/24/2020   by telephone and verified that I am speaking with the correct person using two identifiers. Peggy [name], 1991/01/04. she has verbally consented to this visit. All issues noted in this document were discussed and addressed. The format was not optimal for physical exam.  Subjective:    Patient ID: Peggy [name], female    DOB: 1991-04-29.   Past Medical History:  Diagnosis Date  . Asthma   . Gestational diabetes   . Pyloric stenosis   . Thyroid disease    Graves    Past Surgical History:  Procedure Laterality Date  . PYLOROMYOTOMY      Social History   Socioeconomic History  . Marital status: Legally Separated    Spouse name: Evonna Stoltz  . Number of children: 2  . Years of education: Not on file  . Highest education level: Some college, no degree  Occupational History  . Not on file  Tobacco Use  . Smoking status: Current Every Day Smoker    Packs/day: 0.25    Years: 5.00    Pack years: 1.25    Types: Cigarettes  . Smokeless tobacco: Never Used  . Tobacco comment: 2 cig/day  Substance and Sexual Activity  . Alcohol use: No  . Drug use: No  . Sexual activity: Yes    Birth control/protection: I.U.D.  Other Topics Concern  . Not on file  Social History Narrative  . Not on file   Social Determinants of Health   Financial Resource Strain:   . Difficulty of Paying Living Expenses: Not on file  Food Insecurity:   . Worried About Charity fundraiser in the Last Year: Not on file  . Ran Out of Food in the Last Year: Not on file  Transportation Needs:   . Lack of Transportation (Medical): Not on file  . Lack of Transportation (Non-Medical): Not on file  Physical Activity:   . Days of Exercise per Week: Not on file  . Minutes of Exercise per Session: Not on file  Stress:   . Feeling  of Stress : Not on file  Social Connections:   . Frequency of Communication with Friends and Family: Not on file  . Frequency of Social Gatherings with Friends and Family: Not on file  . Attends Religious Services: Not on file  . Active Member of Clubs or Organizations: Not on file  . Attends Archivist Meetings: Not on file  . Marital Status: Not on file    Family History  Problem Relation Age of Onset  . Arthritis Mother   . Cancer Mother        thyroid  . Depression Mother   . Hyperlipidemia Father   . Asthma Father   . Hypertension Father   . Heart disease Father   . COPD Maternal Grandfather   . Diabetes Paternal Grandmother   . Arthritis Paternal Grandmother   . Asthma Paternal Grandmother   . Heart disease Paternal Grandmother   . Depression Paternal Grandmother   . Hypertension Paternal Grandmother   . Hyperlipidemia Paternal Grandmother   . Other Neg Hx     Outpatient Encounter Medications as of 01/24/2020  Medication Sig  .  acetaminophen (TYLENOL) 325 MG tablet Take 650 mg by mouth every 6 (six) hours as needed for mild pain or headache.  . albuterol (VENTOLIN HFA) 108 (90 Base) MCG/ACT inhaler TAKE 2 PUFFS BY MOUTH EVERY 6 HOURS AS NEEDED FOR WHEEZE OR SHORTNESS OF BREATH  . Levonorgestrel (KYLEENA) 19.5 MG IUD by Intrauterine route.  . methimazole (TAPAZOLE) 10 MG tablet Take 1 tablet (10 mg total) by mouth 3 (three) times daily.  . naproxen (NAPROSYN) 500 MG tablet Take 1 tablet (500 mg total) by mouth 2 (two) times daily with a meal.  . predniSONE (DELTASONE) 20 MG tablet Take 1 tablet (20 mg total) by mouth daily with breakfast.  . propranolol (INDERAL) 40 MG tablet Take 0.5 tablets (20 mg total) by mouth 2 (two) times daily.   No facility-administered encounter medications on file as of 01/24/2020.    ALLERGIES: No Known Allergies  VACCINATION STATUS: Immunization History  Administered Date(s) Administered  . Influenza,inj,Quad PF,6+ Mos  12/11/2014, 09/23/2018  . MMR 12/12/2014  . Pneumococcal Polysaccharide-23 07/12/2012, 12/12/2014, 08/17/2017  . Tdap 12/11/2014, 06/02/2017, 02/18/2019     HPI  Peggy [name] is 29 y.o. female who is being engaged in telehealth via telephone for follow-up of hyperthyroidism.  -She underwent thyroid uptake and scan at [facility] on January 21 and 22, 2021.  Her 24-hour uptake was diffusely increased tracer accumulation throughout both lobes of the thyroid with 46.5% uptake consistent with Graves' disease.  She is off of methimazole, remains on prednisone 20 mg p.o. daily in preparation before her exposure to I-131.  She denies any new complaints today.   -Her thyroid uptake and scan and I-131 thyroid ablation was delayed due to the fact that she decided to keep breast-feeding her young child who is now 75-monthold.  She stopped breast-feeding 3 weeks ago.   -Her recent previsit thyroid function tests are still consistent with significant thyroid hormone burden. she denies dysphagia, choking, shortness of breath, no recent voice change.  she reports thyroid cancer in her mother who had to undergo thyroidectomy and what appears to be radioactive iodine thyroid remnant ablation.   she denies personal history of goiter.                           Review of systems See the note from last visit.   Objective:    There were no vitals taken for this visit.  Wt Readings from Last 3 Encounters:  01/19/20 120 lb 6.4 oz (54.6 kg)  01/04/20 116 lb 8 oz (52.8 kg)  10/21/19 123 lb (55.8 kg)                                                Physical exam  -See the note from last visit.   CMP     Component Value Date/Time   NA 134 (L) 08/17/2019 0004   K 3.9 08/17/2019 0004   CL 104 08/17/2019 0004   CO2 21 (L) 08/17/2019 0004   GLUCOSE 243 (H) 08/17/2019 0004   BUN 12 08/17/2019 0004   CREATININE 0.57 08/17/2019 0004   CALCIUM 9.3 08/17/2019 0004   PROT 8.2 (H)  10/08/2016 2239   ALBUMIN 4.2 10/08/2016 2239   AST 22 10/08/2016 2239   ALT 25 10/08/2016 2239   ALKPHOS 59 10/08/2016 2239  BILITOT 0.3 10/08/2016 2239   GFRNONAA >60 08/17/2019 0004   GFRAA >60 08/17/2019 0004    CBC    Component Value Date/Time   WBC 9.4 08/17/2019 0004   RBC 5.11 08/17/2019 0004   HGB 13.9 08/17/2019 0004   HGB 11.8 01/28/2019 0844   HCT 43.1 08/17/2019 0004   HCT 34.8 01/28/2019 0844   PLT 358 08/17/2019 0004   PLT 328 01/28/2019 0844   MCV 84.3 08/17/2019 0004   MCV 89 01/28/2019 0844   MCH 27.2 08/17/2019 0004   MCHC 32.3 08/17/2019 0004   RDW 12.6 08/17/2019 0004   RDW 12.7 01/28/2019 0844   LYMPHSABS 0.7 08/17/2019 0004   LYMPHSABS 2.7 09/25/2018 0847   MONOABS 0.1 08/17/2019 0004   EOSABS 0.0 08/17/2019 0004   EOSABS 0.3 09/25/2018 0847   BASOSABS 0.0 08/17/2019 0004   BASOSABS 0.0 09/25/2018 0847     Diabetic Labs (most recent): Lab Results  Component Value Date   HGBA1C 5.0 09/25/2018    Lab Results  Component Value Date   TSH <0.005 (L) 01/06/2020   TSH <0.005 (L) 11/30/2019   TSH 0.01 (A) 09/27/2019   TSH 0.001 (L) 08/17/2019   FREET4 1.64 01/06/2020   FREET4 1.76 11/30/2019        Assessment & Plan:   1. Hyperthyroidism 2.  Graves' disease   -  She has had  high degree of antithyroid antibodies, indicating Graves' disease as the etiology, now confirmed by thyroid uptake and scan with 46.5% uniform uptake throughout both thyroid lobes. -  She is at risk of complications of untreated hyperthyroidism including thyroid storm.  The potential risks of untreated thyrotoxicosis and the need for definitive therapy have been discussed in detail with her, and she has partial response antithyroid treatment with methimazole. -I discussed options of treatment with her in detail.  Option is I-131 thyroid ablation which she agrees with.  This treatment will be scheduled to be administered as soon as possible in [facility]  in [place].  -Precautions are advised to her.  She is also advised to avoid breast-feeding and pregnancy until after 6 months from time of her last  I131 exposure.   She understands the subsequent hypothyroidism which will need lifelong thyroid hormone replacements.  -She is advised to stay off of methimazole for that scan.  She is advised to continue prednisone funtil 10 to 15 days after her I-131 dosing.  She will continue her propranolol 40 mg p.o. twice daily.    - I advised her to maintain close follow up with her new PMD , Dr. Benny Lennert.     - Time spent on this patient care encounter:  25 minutes of which 50% was spent in  counseling and the rest reviewing  her current and  previous labs / studies and medications  doses and developing a plan for long term care. Peggy [name]  participated in the discussions, expressed understanding, and voiced agreement with the above plans.  All questions were answered to her satisfaction. she is encouraged to contact clinic should she have any questions or concerns prior to her return visit.    Follow up plan: Return in about 9 weeks (around 03/27/2020) for Follow up with Labs after I131 Therapy.   Thank you for involving me in the care of this pleasant patient, and I will continue to update you with her progress.  Glade Lloyd, MD [facility] Phone: [phone]  Fax:  [phone]   01/24/2020, 1:04 PM  This note was partially dictated with voice recognition software. Similar sounding words can be transcribed inadequately or may not  be corrected upon review.

## 2020-01-24 NOTE — Telephone Encounter (Signed)
Called [facility] and made them aware pt was sent to [facility].

## 2020-01-24 NOTE — Telephone Encounter (Signed)
Magda Paganini with [facility] called and wants you to call her at [phone]

## 2020-01-25 ENCOUNTER — Ambulatory Visit (HOSPITAL_COMMUNITY): Payer: Medicaid Other | Admitting: Physical Therapy

## 2020-01-25 ENCOUNTER — Encounter (HOSPITAL_COMMUNITY): Payer: Medicaid Other

## 2020-01-25 ENCOUNTER — Other Ambulatory Visit: Payer: Self-pay

## 2020-01-25 DIAGNOSIS — M79604 Pain in right leg: Secondary | ICD-10-CM

## 2020-01-25 DIAGNOSIS — M533 Sacrococcygeal disorders, not elsewhere classified: Secondary | ICD-10-CM

## 2020-01-25 LAB — CERVICOVAGINAL ANCILLARY ONLY
Chlamydia: NEGATIVE
Comment: NEGATIVE
Comment: NEGATIVE
Comment: NORMAL
Neisseria Gonorrhea: NEGATIVE
Trichomonas: NEGATIVE

## 2020-01-25 NOTE — Therapy (Signed)
[place], [place], [address] Phone: [phone]   Fax:  [phone]  Physical Therapy Treatment  Patient Details  Name: Peggy [name] MRN: [id number] Date of Birth: November 02, 1991 Referring Provider (PT): Cresenzo-Dishmon   Encounter Date: 01/25/2020  PT End of Session - 01/25/20 1440    Visit Number  2    Number of Visits  8    Date for PT Re-Evaluation  02/02/20    Authorization Type  medicaid put in for 3 visits    Authorization Time Period  cert thru 0000000    Authorization - Visit Number  2    Authorization - Number of Visits  8    PT Start Time  1400    PT Stop Time  1425    PT Time Calculation (min)  25 min    Activity Tolerance  Patient tolerated treatment well       Past Medical History:  Diagnosis Date  . Asthma   . Gestational diabetes   . Pyloric stenosis   . Thyroid disease    Graves    Past Surgical History:  Procedure Laterality Date  . PYLOROMYOTOMY      There were no vitals filed for this visit.  Subjective Assessment - 01/25/20 1404    Subjective  PT states that she has been doing her exercises.  She is able to sit for a longer time without having pain.    Limitations  House hold activities    How long can you sit comfortably?  PT states that her tailbone hurts if she sits any longer than a few minutes; going sit to stand is painful    How long can you stand comfortably?  no problem    How long can you walk comfortably?  no problem    Patient Stated Goals  less pain    Currently in Pain?  Yes    Pain Score  2     Pain Location  Pelvis    Pain Descriptors / Indicators  Aching    Pain Type  Chronic pain    Pain Onset  More than a month ago    Pain Frequency  Intermittent    Aggravating Factors   sitting    Pain Relieving Factors  not sure    Effect of Pain on Daily Activities  just deals with it                       [facility] Adult PT Treatment/Exercise -  01/25/20 0001      Exercises   Exercises  Lumbar      Lumbar Exercises: Supine   Ab Set  5 reps    AB Set Limitations  6 pt abdominal in each direction.     Pelvic Tilt Limitations  fig leaf flossing / marble from belly button to pelvic bone.     Glut Set Limitations  sitS bones together, tailbone to pelvic bone;  All four together.                PT Short Term Goals - 01/25/20 1443      PT SHORT TERM GOAL #1   Title  PT  to be experiencing pelvic pain one time a day.    Time  2    Period  Weeks    Status  On-going    Target Date  01/26/20      PT SHORT TERM GOAL #2  Title  PT to be able to sit for 20 minutes in comfort.    Time  2    Period  Weeks    Status  On-going        PT Long Term Goals - 01/25/20 1444      PT LONG TERM GOAL #1   Title  Pt to no longer be experiencing any pelvic or groin pain    Time  4    Period  Weeks    Status  On-going      PT LONG TERM GOAL #2   Title  PT to be able to sit for an hour without any increased pain .    Time  4    Period  Weeks    Status  On-going      PT LONG TERM GOAL #3   Title  Pt to be I in HEP to allow proper alignment of her SI jt    Time  4    Period  Weeks    Status  On-going            Plan - 01/25/20 1441    Clinical Impression Statement  PT SI checked with noted good alignment. PT instructed in strengthening exercises for abdominal as well as pelvic area.    Examination-Activity Limitations  Sit;Stairs    Rehab Potential  Good    PT Frequency  2x / week    PT Duration  4 weeks    PT Treatment/Interventions  Manual techniques;Patient/family education;Therapeutic exercise    PT Next Visit Plan  sitting Rt hip abdcution/extension;  stabilixation heelslides with leg off ground, abduction and quadriped SLR    PT Home Exercise Plan  given RT knee to chest; Lt single leg bridge, "Sits" bones together, pelvic/tailbone together then all four    Consulted and Agree with Plan of Care  Patient        Patient will benefit from skilled therapeutic intervention in order to improve the following deficits and impairments:  Pain, Postural dysfunction, Decreased strength  Visit Diagnosis: Sacroiliac dysfunction  Pain in right leg     Problem List Patient Active Problem List   Diagnosis Date Noted  . Graves' disease 01/24/2020  . Hyperthyroidism 01/10/2020  . Encounter for initial prescription of intrauterine contraceptive device (IUD) 05/25/2019  . Depression screening 05/25/2019  . Encounter for postpartum visit 05/25/2019  . Indication for care in labor or delivery 04/26/2019  . Group beta Strep positive 04/26/2019  . SVD (spontaneous vaginal delivery) 04/26/2019  . History of gestational diabetes 10/28/2018  . Low vitamin D level 02/12/2017  . Smoker 07/25/2014   Rayetta Humphrey, PT CLT [phone] 01/25/2020, 2:44 PM  [place] [address] [place], [place], [address] Phone: [phone]   Fax:  [phone]  Name: Peggy [name] MRN: [id number] Date of Birth: August 25, 1991

## 2020-01-26 ENCOUNTER — Encounter (HOSPITAL_COMMUNITY): Payer: Medicaid Other

## 2020-01-27 ENCOUNTER — Ambulatory Visit (HOSPITAL_COMMUNITY): Payer: Medicaid Other | Admitting: Physical Therapy

## 2020-01-27 ENCOUNTER — Telehealth: Payer: Self-pay | Admitting: "Endocrinology

## 2020-01-27 ENCOUNTER — Encounter (HOSPITAL_COMMUNITY): Payer: Self-pay | Admitting: Physical Therapy

## 2020-01-27 ENCOUNTER — Other Ambulatory Visit: Payer: Self-pay

## 2020-01-27 DIAGNOSIS — M533 Sacrococcygeal disorders, not elsewhere classified: Secondary | ICD-10-CM | POA: Diagnosis not present

## 2020-01-27 DIAGNOSIS — E0500 Thyrotoxicosis with diffuse goiter without thyrotoxic crisis or storm: Secondary | ICD-10-CM

## 2020-01-27 DIAGNOSIS — M79604 Pain in right leg: Secondary | ICD-10-CM

## 2020-01-27 NOTE — Telephone Encounter (Signed)
Pt said that Dr Dorris Fetch told her that if the hospital has not called for her treatment to let us know. She said they have not called her. Please advise

## 2020-01-27 NOTE — Telephone Encounter (Signed)
Correction on time of appointment with NM is at 9:00a.m., pt aware.

## 2020-01-27 NOTE — Telephone Encounter (Signed)
Called [facility] to check on status of appointment for nuclear med RAI therapy. Denise with NM will call me back with information.

## 2020-01-27 NOTE — Telephone Encounter (Signed)
Left a message requesting pt return a call to the office to discuss appointment with NM.

## 2020-01-27 NOTE — Telephone Encounter (Signed)
Made pt aware of appointment with NM at [facility] for Tuesday 02-01-20 at 9:00p.m.

## 2020-01-27 NOTE — Therapy (Signed)
[place], [place], [address] Phone: [phone]   Fax:  [phone]  Physical Therapy Treatment  Patient Details  Name: Peggy [name] MRN: [id number] Date of Birth: 03/24/1991 Referring Provider (PT): Cresenzo-Dishmon   Encounter Date: 01/27/2020  PT End of Session - 01/27/20 1447    Visit Number  3    Number of Visits  8    Date for PT Re-Evaluation  02/02/20    Authorization Type  medicaid put in for 3 visits    Authorization Time Period  cert thru 0000000    Authorization - Visit Number  3    Authorization - Number of Visits  8    PT Start Time  1400    PT Stop Time  1445    PT Time Calculation (min)  45 min    Activity Tolerance  Patient tolerated treatment well       Past Medical History:  Diagnosis Date  . Asthma   . Gestational diabetes   . Pyloric stenosis   . Thyroid disease    Graves    Past Surgical History:  Procedure Laterality Date  . PYLOROMYOTOMY      There were no vitals filed for this visit.  Subjective Assessment - 01/27/20 1408    Subjective  Pt states that she 80% better    Limitations  House hold activities    How long can you sit comfortably?  PT states that her tailbone hurts if she sits any longer than a few minutes; going sit to stand is painful    How long can you stand comfortably?  no problem    How long can you walk comfortably?  no problem    Patient Stated Goals  less pain    Currently in Pain?  No/denies    Pain Onset  More than a month ago                       [facility] Adult PT Treatment/Exercise - 01/27/20 0001      Exercises   Exercises  Lumbar      Lumbar Exercises: Seated   Other Seated Lumbar Exercises  abduction on forearm x 10 B     Other Seated Lumbar Exercises  sit with legs 90/90 in front top leg pulses into extension       Lumbar Exercises: Supine   Large Ball Abdominal Isometric  10 reps    Large Ball Oblique Isometric   10 reps    Other Supine Lumbar Exercises  abdominal crunch x 10       Lumbar Exercises: Quadruped   Straight Leg Raise  10 reps    Opposite Arm/Leg Raise  10 reps      Manual Therapy   Manual Therapy  Muscle Energy Technique    Manual therapy comments  done seperate from all other aspects     Muscle Energy Technique  correction of SI               PT Short Term Goals - 01/25/20 1443      PT SHORT TERM GOAL #1   Title  PT  to be experiencing pelvic pain one time a day.    Time  2    Period  Weeks    Status  On-going    Target Date  01/26/20      PT SHORT TERM GOAL #2   Title  PT to be  able to sit for 20 minutes in comfort.    Time  2    Period  Weeks    Status  On-going        PT Long Term Goals - 01/25/20 1444      PT LONG TERM GOAL #1   Title  Pt to no longer be experiencing any pelvic or groin pain    Time  4    Period  Weeks    Status  On-going      PT LONG TERM GOAL #2   Title  PT to be able to sit for an hour without any increased pain .    Time  4    Period  Weeks    Status  On-going      PT LONG TERM GOAL #3   Title  Pt to be I in HEP to allow proper alignment of her SI jt    Time  4    Period  Weeks    Status  On-going            Plan - 01/27/20 1448    Clinical Impression Statement  PT SI slightly out of place but easily corrected with mm energy techinques.  Began strengthening exercises of pelvic mm as well as increasing intensity of abdominal exercises.    Examination-Activity Limitations  Sit;Stairs    Rehab Potential  Good    PT Frequency  2x / week    PT Duration  4 weeks    PT Treatment/Interventions  Manual techniques;Patient/family education;Therapeutic exercise    PT Next Visit Plan  Begin standing t-band exerecises for pelvic/abdominal mm    PT Home Exercise Plan  given RT knee to chest; Lt single leg bridge, "Sits" bones together, pelvic/tailbone together then all four    Consulted and Agree with Plan of Care  Patient        Patient will benefit from skilled therapeutic intervention in order to improve the following deficits and impairments:  Pain, Postural dysfunction, Decreased strength  Visit Diagnosis: Pain in right leg  Sacroiliac dysfunction     Problem List Patient Active Problem List   Diagnosis Date Noted  . Graves' disease 01/24/2020  . Hyperthyroidism 01/10/2020  . Encounter for initial prescription of intrauterine contraceptive device (IUD) 05/25/2019  . Depression screening 05/25/2019  . Encounter for postpartum visit 05/25/2019  . Indication for care in labor or delivery 04/26/2019  . Group beta Strep positive 04/26/2019  . SVD (spontaneous vaginal delivery) 04/26/2019  . History of gestational diabetes 10/28/2018  . Low vitamin D level 02/12/2017  . Smoker 07/25/2014    Rayetta Humphrey, PT CLT [phone] 01/27/2020, 2:52 PM  [facility] [address] [place], [place], [address] Phone: [phone]   Fax:  [phone]  Name: Peggy [name] MRN: [id number] Date of Birth: 1991/07/06

## 2020-01-28 ENCOUNTER — Other Ambulatory Visit: Payer: Self-pay | Admitting: "Endocrinology

## 2020-01-28 ENCOUNTER — Ambulatory Visit: Payer: Medicaid Other | Admitting: "Endocrinology

## 2020-01-28 DIAGNOSIS — E0500 Thyrotoxicosis with diffuse goiter without thyrotoxic crisis or storm: Secondary | ICD-10-CM | POA: Diagnosis not present

## 2020-01-29 LAB — BETA HCG QUANT (REF LAB): hCG Quant: <1 m[IU]/mL

## 2020-01-31 ENCOUNTER — Telehealth (HOSPITAL_COMMUNITY): Payer: Self-pay | Admitting: Physical Therapy

## 2020-01-31 NOTE — Telephone Encounter (Signed)
pt is having a medical procedure and will no be able to come in until next week

## 2020-02-01 ENCOUNTER — Encounter: Payer: Self-pay | Admitting: "Endocrinology

## 2020-02-01 ENCOUNTER — Ambulatory Visit (HOSPITAL_COMMUNITY): Payer: Medicaid Other | Admitting: Physical Therapy

## 2020-02-01 DIAGNOSIS — E0590 Thyrotoxicosis, unspecified without thyrotoxic crisis or storm: Secondary | ICD-10-CM | POA: Diagnosis not present

## 2020-02-01 DIAGNOSIS — E0500 Thyrotoxicosis with diffuse goiter without thyrotoxic crisis or storm: Secondary | ICD-10-CM | POA: Diagnosis not present

## 2020-02-03 ENCOUNTER — Ambulatory Visit (HOSPITAL_COMMUNITY): Payer: Medicaid Other | Admitting: Physical Therapy

## 2020-02-07 ENCOUNTER — Telehealth: Payer: Self-pay | Admitting: "Endocrinology

## 2020-02-07 NOTE — Telephone Encounter (Signed)
Discussed with pt, understanding voiced. 

## 2020-02-07 NOTE — Telephone Encounter (Signed)
Pt said she is experiencing hot flashes, light headed/dizzy, swollen throat,Nausea. This has been going on since her therapy she had last week. Please advise.

## 2020-02-07 NOTE — Telephone Encounter (Signed)
These are expected symptoms after her I-131 dosing.  They will improve over time.  Advise her to stay on prednisone and propranolol.

## 2020-02-09 ENCOUNTER — Encounter: Payer: Self-pay | Admitting: Advanced Practice Midwife

## 2020-02-09 ENCOUNTER — Other Ambulatory Visit: Payer: Self-pay

## 2020-02-09 ENCOUNTER — Other Ambulatory Visit (INDEPENDENT_AMBULATORY_CARE_PROVIDER_SITE_OTHER): Payer: Medicaid Other | Admitting: *Deleted

## 2020-02-09 DIAGNOSIS — R300 Dysuria: Secondary | ICD-10-CM

## 2020-02-09 DIAGNOSIS — R3915 Urgency of urination: Secondary | ICD-10-CM

## 2020-02-09 LAB — POCT URINALYSIS DIPSTICK
Blood, UA: NEGATIVE
Glucose, UA: NEGATIVE
Ketones, UA: NEGATIVE
Leukocytes, UA: NEGATIVE
Nitrite, UA: POSITIVE
Protein, UA: POSITIVE — AB

## 2020-02-09 MED ORDER — SULFAMETHOXAZOLE-TRIMETHOPRIM 800-160 MG PO TABS: 1.0000 | ORAL_TABLET | Freq: Two times a day (BID) | ORAL | 0 refills | Status: DC

## 2020-02-09 NOTE — Progress Notes (Signed)
Chart reviewed for nurse visit. Agree with plan of care. Pt is not breastfeeding, will rx septra ds Estill Dooms, NP 02/09/2020 4:43 PM

## 2020-02-09 NOTE — Addendum Note (Signed)
Addended by: Derrek Monaco A on: 02/09/2020 04:46 PM   Modules accepted: Orders

## 2020-02-09 NOTE — Progress Notes (Signed)
   NURSE VISIT- UTI SYMPTOMS   SUBJECTIVE:  Peggy [name] is a 29 y.o. [phone] female here for UTI symptoms. She is a GYN patient. She reports dysuria, lower abdominal pain and urinary urgency.  OBJECTIVE:  There were no vitals taken for this visit.  Appears well, in no apparent distress  Results for orders placed or performed in visit on 02/09/20 (from the past 24 hour(s))  POCT Urinalysis Dipstick   Collection Time: 02/09/20  3:15 PM  Result Value Ref Range   Color, UA     Clarity, UA     Glucose, UA Negative Negative   Bilirubin, UA     Ketones, UA neg    Spec Grav, UA     Blood, UA neg    pH, UA     Protein, UA Positive (A) Negative   Urobilinogen, UA     Nitrite, UA positive    Leukocytes, UA Negative Negative   Appearance     Odor      ASSESSMENT: GYN patient with UTI symptoms and positive nitrites  PLAN: Note routed to Derrek Monaco, AGNP   Rx sent by provider today: Yes Urine culture sent Call or return to clinic prn if these symptoms worsen or fail to improve as anticipated. Follow-up: as needed   Peggy [name]  02/09/2020 3:16 PM

## 2020-02-10 ENCOUNTER — Ambulatory Visit (HOSPITAL_COMMUNITY): Payer: Medicaid Other | Admitting: Physical Therapy

## 2020-02-10 ENCOUNTER — Telehealth (HOSPITAL_COMMUNITY): Payer: Self-pay | Admitting: Physical Therapy

## 2020-02-10 NOTE — Telephone Encounter (Signed)
Called pt due to authorization date being expired.  PT states she would like therapist to resubmit and treatment today to be cancelled.   Rayetta Humphrey, [place] CLT [phone]

## 2020-02-11 LAB — URINE CULTURE

## 2020-02-14 ENCOUNTER — Telehealth: Payer: Self-pay | Admitting: Adult Health

## 2020-02-14 MED ORDER — NITROFURANTOIN MONOHYD MACRO 100 MG PO CAPS: 100.0000 mg | ORAL_CAPSULE | Freq: Two times a day (BID) | ORAL | 0 refills | Status: DC

## 2020-02-14 NOTE — Telephone Encounter (Signed)
Urine + E-Coli wa Rx'd septra ds and it is resistant will Rx Macrobid, could not get by phone

## 2020-02-15 ENCOUNTER — Telehealth (HOSPITAL_COMMUNITY): Payer: Self-pay | Admitting: Physical Therapy

## 2020-02-15 ENCOUNTER — Ambulatory Visit (HOSPITAL_COMMUNITY): Payer: Medicaid Other | Attending: Advanced Practice Midwife | Admitting: Physical Therapy

## 2020-02-15 ENCOUNTER — Encounter (HOSPITAL_COMMUNITY): Payer: Self-pay

## 2020-02-15 NOTE — Telephone Encounter (Signed)
Pt called re missed appointment.  Therapist cancelled last appointment due to [facility] approval ending, pt was not sure if she was to come in today or not.   Therapist explained that medicaid has approved 12 visits from 2/16 true 3/29, however, she has no visits scheduled and will need to call the front desk to schedule new appointment times.   Rayetta Humphrey, [place] CLT [phone]

## 2020-02-29 ENCOUNTER — Telehealth: Payer: Self-pay | Admitting: "Endocrinology

## 2020-02-29 NOTE — Telephone Encounter (Signed)
She needs to be seen for exam.

## 2020-02-29 NOTE — Telephone Encounter (Signed)
Pt scheduled to be seen tomorrow 03/01/20 at 10:30a.m.

## 2020-02-29 NOTE — Telephone Encounter (Signed)
Pt called and said that she has been having hives since her radiation. She said they are starting to look like bruises, she actually wants to be seen per her voicemail. Do you think she needs to come in or is this something we can advise on over phone?

## 2020-03-01 ENCOUNTER — Ambulatory Visit: Payer: Medicaid Other | Admitting: "Endocrinology

## 2020-03-23 ENCOUNTER — Telehealth: Payer: Self-pay | Admitting: "Endocrinology

## 2020-03-23 NOTE — Telephone Encounter (Signed)
Pt states since Tuesday she has been experiencing worsening lightheadedness, nasuea, dizziness, headeache, seeing spots and confusion. States today she has also experienced elevated heart rate in the 120s and epiisodes of diarrhea. Advised pt to go to the ER for evaluation, understanding voiced.

## 2020-03-23 NOTE — Telephone Encounter (Signed)
Patient would like you to call her, she has been having symptoms.

## 2020-03-24 ENCOUNTER — Ambulatory Visit (INDEPENDENT_AMBULATORY_CARE_PROVIDER_SITE_OTHER): Payer: Medicaid Other | Admitting: "Endocrinology

## 2020-03-24 ENCOUNTER — Encounter: Payer: Self-pay | Admitting: "Endocrinology

## 2020-03-24 DIAGNOSIS — R110 Nausea: Secondary | ICD-10-CM

## 2020-03-24 DIAGNOSIS — E0590 Thyrotoxicosis, unspecified without thyrotoxic crisis or storm: Secondary | ICD-10-CM

## 2020-03-24 NOTE — Progress Notes (Signed)
03/24/2020                                    Endocrinology Telehealth Visit Follow up Note -During COVID -19 Pandemic  I connected with Peggy [name] on 03/24/2020   by telephone and verified that I am speaking with the correct person using two identifiers. Peggy [name], 1991-10-11. she has verbally consented to this visit. All issues noted in this document were discussed and addressed. The format was not optimal for physical exam.   Subjective:    Patient ID: Peggy [name], female    DOB: 08-13-1991.   Past Medical History:  Diagnosis Date  . Asthma   . Gestational diabetes   . Pyloric stenosis   . Thyroid disease    Graves    Past Surgical History:  Procedure Laterality Date  . PYLOROMYOTOMY      Social History   Socioeconomic History  . Marital status: Legally Separated    Spouse name: Seriah Brotzman  . Number of children: 2  . Years of education: Not on file  . Highest education level: Some college, no degree  Occupational History  . Not on file  Tobacco Use  . Smoking status: Current Every Day Smoker    Packs/day: 0.25    Years: 5.00    Pack years: 1.25    Types: Cigarettes  . Smokeless tobacco: Never Used  . Tobacco comment: 2 cig/day  Substance and Sexual Activity  . Alcohol use: No  . Drug use: No  . Sexual activity: Yes    Birth control/protection: I.U.D.  Other Topics Concern  . Not on file  Social History Narrative  . Not on file   Social Determinants of Health   Financial Resource Strain:   . Difficulty of Paying Living Expenses:   Food Insecurity:   . Worried About Charity fundraiser in the Last Year:   . Arboriculturist in the Last Year:   Transportation Needs:   . Film/video editor (Medical):   Marland Kitchen Lack of Transportation (Non-Medical):   Physical Activity:   . Days of Exercise per Week:   . Minutes of Exercise per Session:   Stress:   . Feeling of Stress :   Social Connections:   . Frequency of Communication  with Friends and Family:   . Frequency of Social Gatherings with Friends and Family:   . Attends Religious Services:   . Active Member of Clubs or Organizations:   . Attends Archivist Meetings:   Marland Kitchen Marital Status:     Family History  Problem Relation Age of Onset  . Arthritis Mother   . Cancer Mother        thyroid  . Depression Mother   . Hyperlipidemia Father   . Asthma Father   . Hypertension Father   . Heart disease Father   . COPD Maternal Grandfather   . Diabetes Paternal Grandmother   . Arthritis Paternal Grandmother   . Asthma Paternal Grandmother   . Heart disease Paternal Grandmother   . Depression Paternal Grandmother   . Hypertension Paternal Grandmother   . Hyperlipidemia Paternal Grandmother   . Other Neg Hx     Outpatient Encounter Medications as of 03/24/2020  Medication Sig  . acetaminophen (TYLENOL) 325 MG tablet Take 650 mg by mouth every 6 (six) hours as needed for mild pain or headache.  Marland Kitchen  albuterol (VENTOLIN HFA) 108 (90 Base) MCG/ACT inhaler TAKE 2 PUFFS BY MOUTH EVERY 6 HOURS AS NEEDED FOR WHEEZE OR SHORTNESS OF BREATH (Patient not taking: Reported on 02/09/2020)  . Levonorgestrel (KYLEENA) 19.5 MG IUD by Intrauterine route.  . naproxen (NAPROSYN) 500 MG tablet Take 1 tablet (500 mg total) by mouth 2 (two) times daily with a meal.  . nitrofurantoin, macrocrystal-monohydrate, (MACROBID) 100 MG capsule Take 1 capsule (100 mg total) by mouth 2 (two) times daily.  . predniSONE (DELTASONE) 20 MG tablet Take 1 tablet (20 mg total) by mouth daily with breakfast.  . propranolol (INDERAL) 40 MG tablet Take 0.5 tablets (20 mg total) by mouth 2 (two) times daily.  Marland Kitchen sulfamethoxazole-trimethoprim (BACTRIM DS) 800-160 MG tablet Take 1 tablet by mouth 2 (two) times daily. Take 1 bid   No facility-administered encounter medications on file as of 03/24/2020.    ALLERGIES: No Known Allergies  VACCINATION STATUS: Immunization History  Administered Date(s)  Administered  . Influenza,inj,Quad PF,6+ Mos 12/11/2014, 09/23/2018  . MMR 12/12/2014  . Pneumococcal Polysaccharide-23 07/12/2012, 12/12/2014, 08/17/2017  . Tdap 12/11/2014, 06/02/2017, 02/18/2019     HPI  Peggy [name] is 29 y.o. female who is being engaged in telehealth via telephone for a complaint of lightheadedness.  She is s/p I-131 thyroid ablation treatment for hyperthyroidism from Graves' disease on February 01, 2020.  She has not done post therapy thyroid function test yet.  After she called yesterday, she already started to feel better about this nausea and lightheadedness.  She has not gone to the ER for evaluation.  He denies vomiting/diarrhea.   She denies palpitations, tremors, heat intolerance.  She has gained overall 10 pounds since her treatment.   she denies dysphagia, choking, shortness of breath, no recent voice change.  she reports thyroid cancer in her mother who had to undergo thyroidectomy and what appears to be radioactive iodine thyroid remnant ablation.   she denies personal history of goiter.                           Review of systems See the note from last visit.   Objective:    There were no vitals taken for this visit.  Wt Readings from Last 3 Encounters:  01/19/20 120 lb 6.4 oz (54.6 kg)  01/04/20 116 lb 8 oz (52.8 kg)  10/21/19 123 lb (55.8 kg)                                                Physical exam  -See the note from last visit.   CMP     Component Value Date/Time   NA 134 (L) 08/17/2019 0004   K 3.9 08/17/2019 0004   CL 104 08/17/2019 0004   CO2 21 (L) 08/17/2019 0004   GLUCOSE 243 (H) 08/17/2019 0004   BUN 12 08/17/2019 0004   CREATININE 0.57 08/17/2019 0004   CALCIUM 9.3 08/17/2019 0004   PROT 8.2 (H) 10/08/2016 2239   ALBUMIN 4.2 10/08/2016 2239   AST 22 10/08/2016 2239   ALT 25 10/08/2016 2239   ALKPHOS 59 10/08/2016 2239   BILITOT 0.3 10/08/2016 2239   GFRNONAA >60 08/17/2019 0004   GFRAA >60 08/17/2019  0004    CBC    Component Value Date/Time   WBC 9.4 08/17/2019 0004  RBC 5.11 08/17/2019 0004   HGB 13.9 08/17/2019 0004   HGB 11.8 01/28/2019 0844   HCT 43.1 08/17/2019 0004   HCT 34.8 01/28/2019 0844   PLT 358 08/17/2019 0004   PLT 328 01/28/2019 0844   MCV 84.3 08/17/2019 0004   MCV 89 01/28/2019 0844   MCH 27.2 08/17/2019 0004   MCHC 32.3 08/17/2019 0004   RDW 12.6 08/17/2019 0004   RDW 12.7 01/28/2019 0844   LYMPHSABS 0.7 08/17/2019 0004   LYMPHSABS 2.7 09/25/2018 0847   MONOABS 0.1 08/17/2019 0004   EOSABS 0.0 08/17/2019 0004   EOSABS 0.3 09/25/2018 0847   BASOSABS 0.0 08/17/2019 0004   BASOSABS 0.0 09/25/2018 0847     Diabetic Labs (most recent): Lab Results  Component Value Date   HGBA1C 5.0 09/25/2018    Lab Results  Component Value Date   TSH <0.005 (L) 01/06/2020   TSH <0.005 (L) 11/30/2019   TSH 0.01 (A) 09/27/2019   TSH 0.001 (L) 08/17/2019   FREET4 1.64 01/06/2020   FREET4 1.76 11/30/2019        Assessment & Plan:   1.  Nausea without vomiting 2. Hyperthyroidism 3.  Graves' disease   -  She is status post I-131 thyroid ablation on February 01, 2020, did not go for her post therapy thyroid function tests.  She is already feeling better from her nausea after she called yesterday.  She is planning to get her labs done today and will be on schedule next week to assess her for treatment effect.    She understands the subsequent hypothyroidism which will need lifelong thyroid hormone replacements. -It is unlikely for her thyroid condition because of her current nausea without vomiting nor lightheadedness.  She is advised to seek evaluation by her PMD or urgent care if she continues to have the symptoms.   - I advised her to maintain close follow up with her new PMD , Dr. Benny Lennert.     - Time spent on this patient care encounter:  15 minutes of which 50% was spent in  counseling and the rest reviewing  her current and  previous labs / studies and  medications  doses and developing a plan for long term care. Peggy [name]  participated in the discussions, expressed understanding, and voiced agreement with the above plans.  All questions were answered to her satisfaction. she is encouraged to contact clinic should she have any questions or concerns prior to her return visit.   Follow up plan: Return keep her appointment with labs.   Thank you for involving me in the care of this pleasant patient, and I will continue to update you with her progress.  Glade Lloyd, MD [facility] [place] Phone: [phone]  Fax: [phone]   03/24/2020, 1:08 PM  This note was partially dictated with voice recognition software. Similar sounding words can be transcribed inadequately or may not  be corrected upon review.

## 2020-03-27 ENCOUNTER — Other Ambulatory Visit: Payer: Self-pay | Admitting: "Endocrinology

## 2020-03-27 DIAGNOSIS — E0590 Thyrotoxicosis, unspecified without thyrotoxic crisis or storm: Secondary | ICD-10-CM | POA: Diagnosis not present

## 2020-03-28 ENCOUNTER — Ambulatory Visit: Payer: Medicaid Other | Admitting: "Endocrinology

## 2020-03-28 LAB — SPECIMEN STATUS REPORT

## 2020-03-29 LAB — TSH: TSH: 85.2 u[IU]/mL — ABNORMAL HIGH (ref 0.450–4.500)

## 2020-03-29 LAB — T4, FREE: Free T4: <0.1 ng/dL — ABNORMAL LOW (ref 0.82–1.77)

## 2020-04-06 ENCOUNTER — Encounter: Payer: Self-pay | Admitting: "Endocrinology

## 2020-04-06 ENCOUNTER — Ambulatory Visit (INDEPENDENT_AMBULATORY_CARE_PROVIDER_SITE_OTHER): Payer: Medicaid Other | Admitting: "Endocrinology

## 2020-04-06 DIAGNOSIS — E890 Postprocedural hypothyroidism: Secondary | ICD-10-CM

## 2020-04-06 MED ORDER — LEVOTHYROXINE SODIUM 50 MCG PO TABS: 50.0000 ug | ORAL_TABLET | Freq: Every day | ORAL | 2 refills | Status: DC

## 2020-04-06 NOTE — Progress Notes (Signed)
04/06/2020                                       Endocrinology Telehealth Visit Follow up Note -During COVID -19 Pandemic  I connected with Peggy [name] on 04/06/2020   by telephone and verified that I am speaking with the correct person using two identifiers. Peggy [name], January 06, 1991. she has verbally consented to this visit. All issues noted in this document were discussed and addressed. The format was not optimal for physical exam.    Subjective:    Patient ID: Peggy [name], female    DOB: [birth year].   Past Medical History:  Diagnosis Date  . Asthma   . Gestational diabetes   . Pyloric stenosis   . Thyroid disease    Graves    Past Surgical History:  Procedure Laterality Date  . PYLOROMYOTOMY      Social History   Socioeconomic History  . Marital status: Legally Separated    Spouse name: Audrena Talaga  . Number of children: 2  . Years of education: Not on file  . Highest education level: Some college, no degree  Occupational History  . Not on file  Tobacco Use  . Smoking status: Current Every Day Smoker    Packs/day: 0.25    Years: 5.00    Pack years: 1.25    Types: Cigarettes  . Smokeless tobacco: Never Used  . Tobacco comment: 2 cig/day  Substance and Sexual Activity  . Alcohol use: No  . Drug use: No  . Sexual activity: Yes    Birth control/protection: I.U.D.  Other Topics Concern  . Not on file  Social History Narrative  . Not on file   Social Determinants of Health   Financial Resource Strain:   . Difficulty of Paying Living Expenses:   Food Insecurity:   . Worried About Charity fundraiser in the Last Year:   . Arboriculturist in the Last Year:   Transportation Needs:   . Film/video editor (Medical):   Marland Kitchen Lack of Transportation (Non-Medical):   Physical Activity:   . Days of Exercise per Week:   . Minutes of Exercise per Session:   Stress:   . Feeling of Stress :   Social Connections:   . Frequency of Communication  with Friends and Family:   . Frequency of Social Gatherings with Friends and Family:   . Attends Religious Services:   . Active Member of Clubs or Organizations:   . Attends Archivist Meetings:   Marland Kitchen Marital Status:     Family History  Problem Relation Age of Onset  . Arthritis Mother   . Cancer Mother        thyroid  . Depression Mother   . Hyperlipidemia Father   . Asthma Father   . Hypertension Father   . Heart disease Father   . COPD Maternal Grandfather   . Diabetes Paternal Grandmother   . Arthritis Paternal Grandmother   . Asthma Paternal Grandmother   . Heart disease Paternal Grandmother   . Depression Paternal Grandmother   . Hypertension Paternal Grandmother   . Hyperlipidemia Paternal Grandmother   . Other Neg Hx     Outpatient Encounter Medications as of 04/06/2020  Medication Sig  . acetaminophen (TYLENOL) 325 MG tablet Take 650 mg by mouth every 6 (six) hours as needed for mild pain  or headache.  . albuterol (VENTOLIN HFA) 108 (90 Base) MCG/ACT inhaler TAKE 2 PUFFS BY MOUTH EVERY 6 HOURS AS NEEDED FOR WHEEZE OR SHORTNESS OF BREATH (Patient not taking: Reported on 02/09/2020)  . Levonorgestrel (KYLEENA) 19.5 MG IUD by Intrauterine route.  Marland Kitchen levothyroxine (SYNTHROID) 50 MCG tablet Take 1 tablet (50 mcg total) by mouth daily before breakfast.  . naproxen (NAPROSYN) 500 MG tablet Take 1 tablet (500 mg total) by mouth 2 (two) times daily with a meal.  . nitrofurantoin, macrocrystal-monohydrate, (MACROBID) 100 MG capsule Take 1 capsule (100 mg total) by mouth 2 (two) times daily.  Marland Kitchen sulfamethoxazole-trimethoprim (BACTRIM DS) 800-160 MG tablet Take 1 tablet by mouth 2 (two) times daily. Take 1 bid  . [DISCONTINUED] predniSONE (DELTASONE) 20 MG tablet Take 1 tablet (20 mg total) by mouth daily with breakfast.  . [DISCONTINUED] propranolol (INDERAL) 40 MG tablet Take 0.5 tablets (20 mg total) by mouth 2 (two) times daily.   No facility-administered encounter  medications on file as of 04/06/2020.    ALLERGIES: No Known Allergies  VACCINATION STATUS: Immunization History  Administered Date(s) Administered  . Influenza,inj,Quad PF,6+ Mos 12/11/2014, 09/23/2018  . MMR 12/12/2014  . Pneumococcal Polysaccharide-23 07/12/2012, 12/12/2014, 08/17/2017  . Tdap 12/11/2014, 06/02/2017, 02/18/2019     HPI  SANAE [name] is 29 y.o. female who is being engaged in telehealth via telephone after she was given radioactive iodine thyroid ablation for Graves' disease.   She is s/p I-131 thyroid ablation treatment for hyperthyroidism from Graves' disease on February 01, 2020.  She reports feeling better, progressively gaining weight that she lost during thyrotoxicosis.  Her previsit thyroid function tests are consistent with treatment effect with RAI induced hypothyroidism.  She denies palpitations, tremors, heat intolerance. she denies dysphagia, choking, shortness of breath, no recent voice change.  she reports thyroid cancer in her mother who had to undergo thyroidectomy and what appears to be radioactive iodine thyroid remnant ablation.   she denies personal history of goiter.                           Review of systems See the note from last visit.   Objective:    There were no vitals taken for this visit.  Wt Readings from Last 3 Encounters:  01/19/20 120 lb 6.4 oz (54.6 kg)  01/04/20 116 lb 8 oz (52.8 kg)  10/21/19 123 lb (55.8 kg)                                                Physical exam  -See the note from last visit.   CMP     Component Value Date/Time   NA 134 (L) 08/17/2019 0004   K 3.9 08/17/2019 0004   CL 104 08/17/2019 0004   CO2 21 (L) 08/17/2019 0004   GLUCOSE 243 (H) 08/17/2019 0004   BUN 12 08/17/2019 0004   CREATININE 0.57 08/17/2019 0004   CALCIUM 9.3 08/17/2019 0004   PROT 8.2 (H) 10/08/2016 2239   ALBUMIN 4.2 10/08/2016 2239   AST 22 10/08/2016 2239   ALT 25 10/08/2016 2239   ALKPHOS 59 10/08/2016 2239    BILITOT 0.3 10/08/2016 2239   GFRNONAA >60 08/17/2019 0004   GFRAA >60 08/17/2019 0004    CBC    Component Value Date/Time   WBC  9.4 08/17/2019 0004   RBC 5.11 08/17/2019 0004   HGB 13.9 08/17/2019 0004   HGB 11.8 01/28/2019 0844   HCT 43.1 08/17/2019 0004   HCT 34.8 01/28/2019 0844   PLT 358 08/17/2019 0004   PLT 328 01/28/2019 0844   MCV 84.3 08/17/2019 0004   MCV 89 01/28/2019 0844   MCH 27.2 08/17/2019 0004   MCHC 32.3 08/17/2019 0004   RDW 12.6 08/17/2019 0004   RDW 12.7 01/28/2019 0844   LYMPHSABS 0.7 08/17/2019 0004   LYMPHSABS 2.7 09/25/2018 0847   MONOABS 0.1 08/17/2019 0004   EOSABS 0.0 08/17/2019 0004   EOSABS 0.3 09/25/2018 0847   BASOSABS 0.0 08/17/2019 0004   BASOSABS 0.0 09/25/2018 0847     Diabetic Labs (most recent): Lab Results  Component Value Date   HGBA1C 5.0 09/25/2018    Lab Results  Component Value Date   TSH 85.200 (H) 03/27/2020   TSH <0.005 (L) 01/06/2020   TSH <0.005 (L) 11/30/2019   TSH 0.01 (A) 09/27/2019   TSH 0.001 (L) 08/17/2019   FREET4 <0.10 (L) 03/27/2020   FREET4 1.64 01/06/2020   FREET4 1.76 11/30/2019        Assessment & Plan:   -RAI induced hypothyroidism   -  She is status post I-131 thyroid ablation on February 01, 2020 for severe 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 She has responded with subsequent RAI induced hypothyroidism. She is ready for initiation of thyroid hormone replacement.  I discussed and initiated levothyroxine 50 mcg p.o. daily before breakfast.  She will likely require higher doses on subsequent visits.   - We discussed about the correct intake of her thyroid hormone, on empty stomach at fasting, with water, separated by at least 30 minutes from breakfast and other medications,  and separated by more than 4 hours from calcium, iron, multivitamins, acid reflux medications (PPIs). -Patient is made aware of the fact that thyroid hormone replacement is needed for life, dose to be adjusted by  periodic monitoring of thyroid function tests.  -She is advised to discontinue propranolol and prednisone.  Given her family history of thyroid malignancy, she will be considered for thyroid ultrasound in 1-2 years time. - I advised her to maintain close follow up with her new PMD , Dr. Benny Lennert.     - Time spent on this patient care encounter:  25 minutes of which 50% was spent in  counseling and the rest reviewing  her current and  previous labs / studies and medications  doses and developing a plan for long term care. Peggy [name]  participated in the discussions, expressed understanding, and voiced agreement with the above plans.  All questions were answered to her satisfaction. she is encouraged to contact clinic should she have any questions or concerns prior to her return visit.  Follow up plan: Return in about 9 weeks (around 06/08/2020) for Follow up with Pre-visit Labs.   Thank you for involving me in the care of this pleasant patient, and I will continue to update you with her progress.  Glade Lloyd, MD [facility] [place] [facility] Phone: [phone]  Fax: [phone]   04/06/2020, 9:43 AM  This note was partially dictated with voice recognition software. Similar sounding words can be transcribed inadequately or may not  be corrected upon review.

## 2020-04-10 ENCOUNTER — Encounter (HOSPITAL_COMMUNITY): Payer: Self-pay | Admitting: *Deleted

## 2020-04-10 ENCOUNTER — Emergency Department (HOSPITAL_COMMUNITY)
Admission: EM | Admit: 2020-04-10 | Discharge: 2020-04-10 | Disposition: A | Discharge status: Home / Self Care | Payer: Medicaid Other | Attending: Emergency Medicine | Admitting: Emergency Medicine

## 2020-04-10 DIAGNOSIS — R519 Headache, unspecified: Secondary | ICD-10-CM | POA: Diagnosis not present

## 2020-04-10 DIAGNOSIS — Z8632 Personal history of gestational diabetes: Secondary | ICD-10-CM | POA: Insufficient documentation

## 2020-04-10 DIAGNOSIS — J45909 Unspecified asthma, uncomplicated: Secondary | ICD-10-CM | POA: Diagnosis not present

## 2020-04-10 DIAGNOSIS — Z20822 Contact with and (suspected) exposure to covid-19: Secondary | ICD-10-CM | POA: Diagnosis not present

## 2020-04-10 DIAGNOSIS — F17210 Nicotine dependence, cigarettes, uncomplicated: Secondary | ICD-10-CM | POA: Insufficient documentation

## 2020-04-10 LAB — COMPREHENSIVE METABOLIC PANEL
ALT: 37 U/L (ref 0–44)
AST: 52 U/L — ABNORMAL HIGH (ref 15–41)
Albumin: 5 g/dL (ref 3.5–5.0)
Alkaline Phosphatase: 72 U/L (ref 38–126)
Anion gap: 9 (ref 5–15)
BUN: 14 mg/dL (ref 6–20)
CO2: 25 mmol/L (ref 22–32)
Calcium: 8.7 mg/dL — ABNORMAL LOW (ref 8.9–10.3)
Chloride: 102 mmol/L (ref 98–111)
Creatinine, Ser: 0.78 mg/dL (ref 0.44–1.00)
GFR calc Af Amer: >60 mL/min (ref >60)
GFR calc non Af Amer: >60 mL/min (ref >60)
Glucose, Bld: 82 mg/dL (ref 70–99)
Potassium: 3.8 mmol/L (ref 3.5–5.1)
Sodium: 136 mmol/L (ref 135–145)
Total Bilirubin: 0.5 mg/dL (ref 0.3–1.2)
Total Protein: 8.6 g/dL — ABNORMAL HIGH (ref 6.5–8.1)

## 2020-04-10 LAB — URINALYSIS, ROUTINE W REFLEX MICROSCOPIC
Bilirubin Urine: NEGATIVE
Glucose, UA: NEGATIVE mg/dL
Hgb urine dipstick: NEGATIVE
Ketones, ur: NEGATIVE mg/dL
Leukocytes,Ua: NEGATIVE
Nitrite: NEGATIVE
Protein, ur: NEGATIVE mg/dL
Specific Gravity, Urine: 1.012 (ref 1.005–1.030)
pH: 7 (ref 5.0–8.0)

## 2020-04-10 LAB — CBC WITH DIFFERENTIAL/PLATELET
Abs Immature Granulocytes: 0.01 10*3/uL (ref 0.00–0.07)
Basophils Absolute: 0.1 10*3/uL (ref 0.0–0.1)
Basophils Relative: 1 %
Eosinophils Absolute: 0.2 10*3/uL (ref 0.0–0.5)
Eosinophils Relative: 3 %
HCT: 46.3 % — ABNORMAL HIGH (ref 36.0–46.0)
Hemoglobin: 14.8 g/dL (ref 12.0–15.0)
Immature Granulocytes: 0 %
Lymphocytes Relative: 33 %
Lymphs Abs: 2.4 10*3/uL (ref 0.7–4.0)
MCH: 28.7 pg (ref 26.0–34.0)
MCHC: 32 g/dL (ref 30.0–36.0)
MCV: 89.7 fL (ref 80.0–100.0)
Monocytes Absolute: 0.3 10*3/uL (ref 0.1–1.0)
Monocytes Relative: 5 %
Neutro Abs: 4.2 10*3/uL (ref 1.7–7.7)
Neutrophils Relative %: 58 %
Platelets: 302 10*3/uL (ref 150–400)
RBC: 5.16 MIL/uL — ABNORMAL HIGH (ref 3.87–5.11)
RDW: 15.9 % — ABNORMAL HIGH (ref 11.5–15.5)
WBC: 7.2 10*3/uL (ref 4.0–10.5)
nRBC: 0 % (ref 0.0–0.2)

## 2020-04-10 MED ORDER — SODIUM CHLORIDE 0.9 % IV BOLUS
1000.0000 mL | Freq: Once | INTRAVENOUS | Status: AC
  Administered 2020-04-10: 1000 mL via INTRAVENOUS

## 2020-04-10 MED ORDER — KETOROLAC TROMETHAMINE 30 MG/ML IJ SOLN
30.0000 mg | Freq: Once | INTRAMUSCULAR | Status: AC
  Administered 2020-04-10: 30 mg via INTRAVENOUS
  Filled 2020-04-10: qty 1

## 2020-04-10 NOTE — ED Provider Notes (Signed)
[facility] Provider Note   CSN: [id number] Arrival date & time: 04/10/20  1428     History Chief Complaint  Patient presents with  . Headache    Peggy [name] is a 29 y.o. female.  The history is provided by the patient. No language interpreter was used.  Headache Pain location:  Occipital Quality:  Unable to specify Radiates to:  Does not radiate Severity currently:  Unable to specify Severity at highest:  Unable to specify Timing:  Constant Progression:  Worsening Chronicity:  New Similar to prior headaches: no   Relieved by:  Nothing Worsened by:  Nothing Ineffective treatments:  None tried Associated symptoms: myalgias and URI   Pt complains of a headache and bodyaches      Past Medical History:  Diagnosis Date  . Asthma   . Gestational diabetes   . Pyloric stenosis   . Thyroid disease    Graves    Patient Active Problem List   Diagnosis Date Noted  . Nausea without vomiting 03/24/2020  . Graves' disease 01/24/2020  . Hyperthyroidism 01/10/2020  . Encounter for initial prescription of intrauterine contraceptive device (IUD) 05/25/2019  . Depression screening 05/25/2019  . Encounter for postpartum visit 05/25/2019  . Indication for care in labor or delivery 04/26/2019  . Group beta Strep positive 04/26/2019  . SVD (spontaneous vaginal delivery) 04/26/2019  . History of gestational diabetes 10/28/2018  . Low vitamin D level 02/12/2017  . Smoker 07/25/2014    Past Surgical History:  Procedure Laterality Date  . PYLOROMYOTOMY       OB History    Gravida  4   Para  4   Term  4   Preterm  0   AB  0   Living  4     SAB  0   TAB  0   Ectopic  0   Multiple  0   Live Births  4           Family History  Problem Relation Age of Onset  . Arthritis Mother   . Cancer Mother        thyroid  . Depression Mother   . Hyperlipidemia Father   . Asthma Father   . Hypertension Father   . Heart disease  Father   . COPD Maternal Grandfather   . Diabetes Paternal Grandmother   . Arthritis Paternal Grandmother   . Asthma Paternal Grandmother   . Heart disease Paternal Grandmother   . Depression Paternal Grandmother   . Hypertension Paternal Grandmother   . Hyperlipidemia Paternal Grandmother   . Other Neg Hx     Social History   Tobacco Use  . Smoking status: Current Every Day Smoker    Packs/day: 0.25    Years: 5.00    Pack years: 1.25    Types: Cigarettes  . Smokeless tobacco: Never Used  . Tobacco comment: 2 cig/day  Substance Use Topics  . Alcohol use: No  . Drug use: No    Home Medications Prior to Admission medications   Medication Sig Start Date End Date Taking? Authorizing Provider  acetaminophen (TYLENOL) 325 MG tablet Take 650 mg by mouth every 6 (six) hours as needed for mild pain or headache.    [provider]  albuterol (VENTOLIN HFA) 108 (90 Base) MCG/ACT inhaler TAKE 2 PUFFS BY MOUTH EVERY 6 HOURS AS NEEDED FOR WHEEZE OR SHORTNESS OF BREATH Patient not taking: Reported on 02/09/2020 11/29/19   Christin Fudge, CNM  Levonorgestrel (KYLEENA) 19.5 MG IUD by Intrauterine route.    [provider]  levothyroxine (SYNTHROID) 50 MCG tablet Take 1 tablet (50 mcg total) by mouth daily before breakfast. 04/06/20   Nida, Marella Chimes, MD  naproxen (NAPROSYN) 500 MG tablet Take 1 tablet (500 mg total) by mouth 2 (two) times daily with a meal. 01/12/20   Cresenzo-Dishmon, Joaquim Lai, CNM  nitrofurantoin, macrocrystal-monohydrate, (MACROBID) 100 MG capsule Take 1 capsule (100 mg total) by mouth 2 (two) times daily. 02/14/20   Estill Dooms, NP  sulfamethoxazole-trimethoprim (BACTRIM DS) 800-160 MG tablet Take 1 tablet by mouth 2 (two) times daily. Take 1 bid 02/09/20   Estill Dooms, NP    Allergies    Patient has no known allergies.  Review of Systems   Review of Systems  Musculoskeletal: Positive for myalgias.  Neurological: Positive  for headaches.  All other systems reviewed and are negative.   Physical Exam Updated Vital Signs BP 131/84 (BP Location: Left Arm)   Pulse 71   Temp 99.2 F (37.3 C) (Oral)   Resp 14   Ht 5\' 1"  (1.549 m)   Wt 57.2 kg   SpO2 100%   BMI 23.81 kg/m   Physical Exam Vitals and nursing note reviewed.  Constitutional:      Appearance: She is well-developed.  HENT:     Head: Normocephalic.  Eyes:     Extraocular Movements: Extraocular movements intact.  Cardiovascular:     Rate and Rhythm: Normal rate.     Heart sounds: Normal heart sounds.  Pulmonary:     Effort: Pulmonary effort is normal.  Abdominal:     General: There is no distension.     Palpations: Abdomen is soft.  Musculoskeletal:        General: Normal range of motion.     Cervical back: Normal range of motion.  Skin:    General: Skin is warm.  Neurological:     Mental Status: She is alert and oriented to person, place, and time.  Psychiatric:        Mood and Affect: Mood normal.     ED Results / Procedures / Treatments   Labs (all labs ordered are listed, but only abnormal results are displayed) Labs Reviewed  CBC WITH DIFFERENTIAL/PLATELET - Abnormal; Notable for the following components:      Result Value   RBC 5.16 (*)    HCT 46.3 (*)    RDW 15.9 (*)    All other components within normal limits  COMPREHENSIVE METABOLIC PANEL - Abnormal; Notable for the following components:   Calcium 8.7 (*)    Total Protein 8.6 (*)    AST 52 (*)    All other components within normal limits  URINALYSIS, ROUTINE W REFLEX MICROSCOPIC - Abnormal; Notable for the following components:   Color, Urine STRAW (*)    All other components within normal limits  SARS CORONAVIRUS 2 (TAT 6-24 HRS)    EKG None  Radiology No results found.  Procedures Procedures (including critical care time)  Medications Ordered in ED Medications  sodium chloride 0.9 % bolus 1,000 mL (0 mLs Intravenous Stopped 04/10/20 1856)    ketorolac (TORADOL) 30 MG/ML injection 30 mg (30 mg Intravenous Given 04/10/20 1744)    ED Course  I have reviewed the triage vital signs and the nursing notes.  Pertinent labs & imaging results that were available during my care of the patient were reviewed by me and considered in my medical decision making (  see chart for details).    MDM Rules/Calculators/A&P                      MDM:  Pt given Iv fluids x 1 liter and toradol.  Pt reports headache resolved and she feels better.  Final Clinical Impression(s) / ED Diagnoses Final diagnoses:  Bad headache    Rx / DC Orders ED Discharge Orders    None    An After Visit Summary was printed and given to the patient.    Fransico Meadow, Hershal Coria 04/10/20 2305    Milton Ferguson, MD 04/12/20 1031

## 2020-04-10 NOTE — ED Triage Notes (Signed)
Requests a note for work

## 2020-04-10 NOTE — Discharge Instructions (Signed)
Your covid is pending

## 2020-04-10 NOTE — ED Triage Notes (Signed)
C/o headache, syncopal episode and multiple complaints for over a week. States she was unable to see a PCP due to child care issues

## 2020-04-11 LAB — SARS CORONAVIRUS 2 (TAT 6-24 HRS): SARS Coronavirus 2: NEGATIVE

## 2020-04-28 ENCOUNTER — Ambulatory Visit: Payer: Medicaid Other | Admitting: Adult Health

## 2020-05-01 ENCOUNTER — Encounter (HOSPITAL_COMMUNITY): Payer: Self-pay | Admitting: Emergency Medicine

## 2020-05-01 ENCOUNTER — Emergency Department (HOSPITAL_COMMUNITY)
Admission: EM | Admit: 2020-05-01 | Discharge: 2020-05-01 | Disposition: A | Discharge status: Home / Self Care | Payer: Medicaid Other | Attending: Emergency Medicine | Admitting: Emergency Medicine

## 2020-05-01 ENCOUNTER — Other Ambulatory Visit: Payer: Self-pay

## 2020-05-01 ENCOUNTER — Emergency Department (HOSPITAL_COMMUNITY): Payer: Medicaid Other

## 2020-05-01 DIAGNOSIS — Z20822 Contact with and (suspected) exposure to covid-19: Secondary | ICD-10-CM | POA: Insufficient documentation

## 2020-05-01 DIAGNOSIS — Z79899 Other long term (current) drug therapy: Secondary | ICD-10-CM | POA: Insufficient documentation

## 2020-05-01 DIAGNOSIS — F17210 Nicotine dependence, cigarettes, uncomplicated: Secondary | ICD-10-CM | POA: Diagnosis not present

## 2020-05-01 DIAGNOSIS — J4521 Mild intermittent asthma with (acute) exacerbation: Secondary | ICD-10-CM

## 2020-05-01 DIAGNOSIS — R0602 Shortness of breath: Secondary | ICD-10-CM | POA: Diagnosis not present

## 2020-05-01 LAB — RESPIRATORY PANEL BY RT PCR (FLU A&B, COVID)
Influenza A by PCR: NEGATIVE
Influenza B by PCR: NEGATIVE
SARS Coronavirus 2 by RT PCR: NEGATIVE

## 2020-05-01 MED ORDER — ALBUTEROL SULFATE (2.5 MG/3ML) 0.083% IN NEBU
5.0000 mg | INHALATION_SOLUTION | Freq: Once | RESPIRATORY_TRACT | Status: AC
  Administered 2020-05-01: 21:00:00 5 mg via RESPIRATORY_TRACT
  Filled 2020-05-01: qty 6

## 2020-05-01 MED ORDER — ALBUTEROL SULFATE HFA 108 (90 BASE) MCG/ACT IN AERS
4.0000 | INHALATION_SPRAY | Freq: Once | RESPIRATORY_TRACT | Status: AC
  Administered 2020-05-01: 20:00:00 4 via RESPIRATORY_TRACT
  Filled 2020-05-01: qty 6.7

## 2020-05-01 MED ORDER — PREDNISONE 50 MG PO TABS
60.0000 mg | ORAL_TABLET | Freq: Once | ORAL | Status: AC
  Administered 2020-05-01: 60 mg via ORAL
  Filled 2020-05-01: qty 1

## 2020-05-01 MED ORDER — PREDNISONE 20 MG PO TABS: 60.0000 mg | ORAL_TABLET | Freq: Every day | ORAL | 0 refills | Status: DC

## 2020-05-01 MED ORDER — IPRATROPIUM BROMIDE 0.02 % IN SOLN
RESPIRATORY_TRACT | Status: AC
  Administered 2020-05-01: 21:00:00 0.5 mg
  Filled 2020-05-01: qty 2.5

## 2020-05-01 NOTE — ED Triage Notes (Signed)
Pt c/o sob, running nose, chest tightness and wheezing that started over weekend.

## 2020-05-01 NOTE — ED Provider Notes (Signed)
[facility] EMERGENCY DEPARTMENT Provider Note  CSN: [id number] Arrival date & time: 05/01/20 [id number]    History Chief Complaint  Patient presents with  . Shortness of Breath    HPI  Peggy [name] is a 29 y.o. female with history of asthma reports she had rhinorrhea/nasal congestion the last 2-3 days for which she was taking over the counter antihistamines with some relief, but today began to have increased wheezing, SOB and cough productive of clear sputum. Denies fevers. No sick contacts. Similar to previous. She used her inhaler several times today without improvement.    Past Medical History:  Diagnosis Date  . Asthma   . Gestational diabetes   . Pyloric stenosis   . Thyroid disease    Graves    Past Surgical History:  Procedure Laterality Date  . PYLOROMYOTOMY      Family History  Problem Relation Age of Onset  . Arthritis Mother   . Cancer Mother        thyroid  . Depression Mother   . Hyperlipidemia Father   . Asthma Father   . Hypertension Father   . Heart disease Father   . COPD Maternal Grandfather   . Diabetes Paternal Grandmother   . Arthritis Paternal Grandmother   . Asthma Paternal Grandmother   . Heart disease Paternal Grandmother   . Depression Paternal Grandmother   . Hypertension Paternal Grandmother   . Hyperlipidemia Paternal Grandmother   . Other Neg Hx     Social History   Tobacco Use  . Smoking status: Current Every Day Smoker    Packs/day: 0.25    Years: 5.00    Pack years: 1.25    Types: Cigarettes  . Smokeless tobacco: Never Used  . Tobacco comment: 2 cig/day  Substance Use Topics  . Alcohol use: No  . Drug use: No     Home Medications Prior to Admission medications   Medication Sig Start Date End Date Taking? Authorizing Provider  acetaminophen (TYLENOL) 325 MG tablet Take 650 mg by mouth every 6 (six) hours as needed for mild pain or headache.    [provider]  albuterol (VENTOLIN HFA) 108 (90 Base)  MCG/ACT inhaler TAKE 2 PUFFS BY MOUTH EVERY 6 HOURS AS NEEDED FOR WHEEZE OR SHORTNESS OF BREATH Patient not taking: Reported on 02/09/2020 11/29/19   Cresenzo-Dishmon, Joaquim Lai, CNM  Levonorgestrel [facility]) 19.5 MG IUD by Intrauterine route.    [provider]  levothyroxine (SYNTHROID) 50 MCG tablet Take 1 tablet (50 mcg total) by mouth daily before breakfast. 04/06/20   Nida, Marella Chimes, MD  naproxen (NAPROSYN) 500 MG tablet Take 1 tablet (500 mg total) by mouth 2 (two) times daily with a meal. 01/12/20   Cresenzo-Dishmon, Joaquim Lai, CNM  predniSONE (DELTASONE) 20 MG tablet Take 3 tablets (60 mg total) by mouth daily. 05/01/20   Truddie Hidden, MD     Allergies    Patient has no known allergies.   Review of Systems   Review of Systems  Constitutional: Negative for fever.  HENT: Positive for rhinorrhea. Negative for congestion and sore throat.   Respiratory: Positive for cough, shortness of breath and wheezing.   Cardiovascular: Negative for chest pain.  Gastrointestinal: Negative for abdominal pain, diarrhea, nausea and vomiting.  Genitourinary: Negative for dysuria.  Musculoskeletal: Negative for myalgias.  Skin: Negative for rash.  Neurological: Negative for headaches.  Psychiatric/Behavioral: Negative for behavioral problems.     Physical Exam BP 136/86 (BP Location: Right Arm)  Pulse 96   Temp 98.7 F (37.1 C) (Oral)   Resp 20   Ht 5\' 1"  (1.549 m)   Wt 59 kg   SpO2 98%   BMI 24.56 kg/m   Physical Exam Vitals and nursing note reviewed.  Constitutional:      Appearance: Normal appearance.  HENT:     Head: Normocephalic and atraumatic.     Nose: Nose normal.     Mouth/Throat:     Mouth: Mucous membranes are moist.  Eyes:     Extraocular Movements: Extraocular movements intact.     Conjunctiva/sclera: Conjunctivae normal.  Cardiovascular:     Rate and Rhythm: Normal rate.  Pulmonary:     Effort: Pulmonary effort is normal.     Breath sounds: No  stridor. Wheezing present.  Abdominal:     General: Abdomen is flat.     Palpations: Abdomen is soft.     Tenderness: There is no abdominal tenderness.  Musculoskeletal:        General: No swelling. Normal range of motion.     Cervical back: Neck supple.  Skin:    General: Skin is warm and dry.  Neurological:     General: No focal deficit present.     Mental Status: She is alert.  Psychiatric:        Mood and Affect: Mood normal.      ED Results / Procedures / Treatments   Labs (all labs ordered are listed, but only abnormal results are displayed) Labs Reviewed  RESPIRATORY PANEL BY RT PCR (FLU A&B, COVID)    EKG EKG Interpretation  Date/Time:  Monday May 01 2020 19:26:51 EDT Ventricular Rate:  94 PR Interval:  150 QRS Duration: 74 QT Interval:  318 QTC Calculation: 397 R Axis:   77 Text Interpretation: Normal sinus rhythm Artifact Nonspecific T wave abnormality Abnormal ECG Since last tracing Rate slower Nonspecific T wave abnormality Confirmed by Calvert Cantor [phone]) on 05/01/2020 7:30:31 PM   Radiology DG Chest 2 View  Result Date: 05/01/2020 CLINICAL DATA:  Shortness of breath. EXAM: CHEST - 2 VIEW COMPARISON:  08/16/2019 FINDINGS: The heart size and mediastinal contours are within normal limits. Both lungs are clear. The visualized skeletal structures are unremarkable. IMPRESSION: No active cardiopulmonary disease. Electronically Signed   By: Constance Holster M.D.   On: 05/01/2020 19:51    Procedures Procedures  Medications Ordered in the ED Medications  albuterol (VENTOLIN HFA) 108 (90 Base) MCG/ACT inhaler 4 puff (4 puffs Inhalation Given 05/01/20 2001)  predniSONE (DELTASONE) tablet 60 mg (60 mg Oral Given 05/01/20 2001)  albuterol (PROVENTIL) (2.5 MG/3ML) 0.083% nebulizer solution 5 mg (5 mg Nebulization Given 05/01/20 2107)  ipratropium (ATROVENT) 0.02 % nebulizer solution (0.5 mg  Given 05/01/20 2112)     ED Course  I have reviewed the triage vital  signs and the nursing notes.  Pertinent labs & imaging results that were available during my care of the patient were reviewed by me and considered in my medical decision making (see chart for details).  Clinical Course as of May 01 2201  Mon May 01, 2020  1950 Patient with presentation consistent with asthma exacerbation. Given restrictions due to Covid, will check a Covid swab and give HFA x 4 puffs plus oral steroids and reassess. CXR images reviewed and appears to be clear.    [CS]  2102 Covid is neg. Still having some wheezing, will give albuterol neb and reassess.    [CS]  2159 Patient feeling much  better and no longer wheezing after neb. She is ready to go home. Will continue albuterol as needed, steroid burst and she was advised to stop smoking.    [CS]    Clinical Course User Index [CS] Truddie Hidden, MD    MDM Rules/Calculators/A&P MDM  Final Clinical Impression(s) / ED Diagnoses Final diagnoses:  Mild intermittent asthma with exacerbation    Rx / DC Orders ED Discharge Orders         Ordered    predniSONE (DELTASONE) 20 MG tablet  Daily     05/01/20 2201           Truddie Hidden, MD 05/01/20 2202

## 2020-05-04 ENCOUNTER — Ambulatory Visit: Payer: Medicaid Other | Admitting: Adult Health

## 2020-05-16 DIAGNOSIS — E039 Hypothyroidism, unspecified: Secondary | ICD-10-CM | POA: Diagnosis not present

## 2020-05-16 DIAGNOSIS — E0500 Thyrotoxicosis with diffuse goiter without thyrotoxic crisis or storm: Secondary | ICD-10-CM | POA: Diagnosis not present

## 2020-05-18 ENCOUNTER — Encounter: Payer: Self-pay | Admitting: Adult Health

## 2020-05-18 ENCOUNTER — Ambulatory Visit: Payer: Medicaid Other | Admitting: Adult Health

## 2020-05-18 VITALS — BP 126/80 | HR 109 | Ht 60.0 in | Wt 137.2 lb

## 2020-05-18 DIAGNOSIS — Z975 Presence of (intrauterine) contraceptive device: Secondary | ICD-10-CM

## 2020-05-18 DIAGNOSIS — Z113 Encounter for screening for infections with a predominantly sexual mode of transmission: Secondary | ICD-10-CM

## 2020-05-18 NOTE — Progress Notes (Signed)
  Subjective:     Patient ID: Peggy [name], female   DOB: March 19, 1991, 29 y.o.   MRN: [id number]  HPI  Peggy [name] is a 29 year old white female,divorced, G4P4 in requesting STD testing.Had radiation in February she says for Graves Disease. She has IUD, placed 05/28/2019,but is thinking of getting it removed, wants one more child before 14.  Review of Systems Has some spotting with IUD Reviewed past medical,surgical, social and family history. Reviewed medications and allergies.     Objective:   Physical Exam BP 126/80 (BP Location: Left Arm, Patient Position: Sitting, Cuff Size: Normal)   Pulse (!) 109   Ht 5' (1.524 m)   Wt 137 lb 3.2 oz (62.2 kg)   Breastfeeding No   BMI 26.80 kg/m  Skin warm and dry.Pelvic: external genitalia is normal in appearance no lesions, vagina: pinkish tan discharge with slight odor,urethra has no lesions or masses noted, cervix:smooth and bulbous,+IUD strings, uterus: normal size, shape and contour, non tender, no masses felt, adnexa: no masses or tenderness noted. Bladder is non tender and no masses felt.Nuswab obtained Examination chaperoned by Celene Squibb LPN    Assessment:     1. Screening examination for STD (sexually transmitted disease) nuswab sent Check HIV and RPR  2. IUD (intrauterine device) in place     Plan:     Return in 4 weeks for pap and physical Would use birth control at least 6 months after radiation, discussed with Dr Elonda Husky

## 2020-05-19 LAB — HIV ANTIBODY (ROUTINE TESTING W REFLEX): HIV Screen 4th Generation wRfx: NONREACTIVE

## 2020-05-19 LAB — RPR: RPR Ser Ql: NONREACTIVE

## 2020-05-22 ENCOUNTER — Other Ambulatory Visit: Payer: Self-pay | Admitting: Adult Health

## 2020-05-22 LAB — NUSWAB VAGINITIS PLUS (VG+)
Atopobium vaginae: HIGH Score — AB
BVAB 2: HIGH Score — AB
Candida albicans, NAA: NEGATIVE
Candida glabrata, NAA: NEGATIVE
Chlamydia trachomatis, NAA: NEGATIVE
Megasphaera 1: HIGH Score — AB
Neisseria gonorrhoeae, NAA: NEGATIVE
Trich vag by NAA: NEGATIVE

## 2020-05-22 MED ORDER — METRONIDAZOLE 500 MG PO TABS: 500.0000 mg | ORAL_TABLET | Freq: Two times a day (BID) | ORAL | 0 refills | Status: DC

## 2020-05-22 NOTE — Progress Notes (Signed)
rx flagyl, +BV on nuswab

## 2020-06-07 ENCOUNTER — Other Ambulatory Visit: Payer: Self-pay | Admitting: "Endocrinology

## 2020-06-07 DIAGNOSIS — E890 Postprocedural hypothyroidism: Secondary | ICD-10-CM | POA: Diagnosis not present

## 2020-06-08 LAB — T4, FREE: Free T4: 0.38 ng/dL — ABNORMAL LOW (ref 0.82–1.77)

## 2020-06-08 LAB — TSH: TSH: 90.5 u[IU]/mL — ABNORMAL HIGH (ref 0.450–4.500)

## 2020-06-09 ENCOUNTER — Other Ambulatory Visit: Payer: Self-pay

## 2020-06-09 ENCOUNTER — Ambulatory Visit (INDEPENDENT_AMBULATORY_CARE_PROVIDER_SITE_OTHER): Payer: Medicaid Other | Admitting: "Endocrinology

## 2020-06-09 ENCOUNTER — Encounter: Payer: Self-pay | Admitting: "Endocrinology

## 2020-06-09 VITALS — BP 121/78 | HR 96 | Ht 60.0 in | Wt 140.6 lb

## 2020-06-09 DIAGNOSIS — E890 Postprocedural hypothyroidism: Secondary | ICD-10-CM | POA: Diagnosis not present

## 2020-06-09 MED ORDER — LEVOTHYROXINE SODIUM 88 MCG PO TABS: 88.0000 ug | ORAL_TABLET | Freq: Every day | ORAL | 1 refills | Status: DC

## 2020-06-09 NOTE — Progress Notes (Signed)
06/09/2020         Endocrinology follow-up note  Subjective:    Patient ID: Peggy [name], female    DOB: 1991/04/20.   Past Medical History:  Diagnosis Date  . Asthma   . Gestational diabetes   . Pyloric stenosis   . Thyroid disease    Graves    Past Surgical History:  Procedure Laterality Date  . PYLOROMYOTOMY      Social History   Socioeconomic History  . Marital status: Divorced    Spouse name: Skyy Nilan  . Number of children: 2  . Years of education: Not on file  . Highest education level: Some college, no degree  Occupational History  . Not on file  Tobacco Use  . Smoking status: Current Every Day Smoker    Packs/day: 0.25    Years: 5.00    Pack years: 1.25    Types: Cigarettes  . Smokeless tobacco: Never Used  . Tobacco comment: 2 cig/day  Vaping Use  . Vaping Use: Never used  Substance and Sexual Activity  . Alcohol use: No  . Drug use: No  . Sexual activity: Yes    Birth control/protection: I.U.D.  Other Topics Concern  . Not on file  Social History Narrative  . Not on file   Social Determinants of Health   Financial Resource Strain:   . Difficulty of Paying Living Expenses:   Food Insecurity:   . Worried About Charity fundraiser in the Last Year:   . Arboriculturist in the Last Year:   Transportation Needs:   . Film/video editor (Medical):   Marland Kitchen Lack of Transportation (Non-Medical):   Physical Activity:   . Days of Exercise per Week:   . Minutes of Exercise per Session:   Stress:   . Feeling of Stress :   Social Connections:   . Frequency of Communication with Friends and Family:   . Frequency of Social Gatherings with Friends and Family:   . Attends Religious Services:   . Active Member of Clubs or Organizations:   . Attends Archivist Meetings:   Marland Kitchen Marital Status:     Family History  Problem Relation Age of Onset  . Arthritis Mother   . Cancer Mother        thyroid  . Depression Mother   .  Hyperlipidemia Father   . Asthma Father   . Hypertension Father   . Heart disease Father   . COPD Maternal Grandfather   . Diabetes Paternal Grandmother   . Arthritis Paternal Grandmother   . Asthma Paternal Grandmother   . Heart disease Paternal Grandmother   . Depression Paternal Grandmother   . Hypertension Paternal Grandmother   . Hyperlipidemia Paternal Grandmother   . Other Neg Hx     Outpatient Encounter Medications as of 06/09/2020  Medication Sig  . albuterol (VENTOLIN HFA) 108 (90 Base) MCG/ACT inhaler TAKE 2 PUFFS BY MOUTH EVERY 6 HOURS AS NEEDED FOR WHEEZE OR SHORTNESS OF BREATH (Patient not taking: Reported on 02/09/2020)  . Levonorgestrel (KYLEENA) 19.5 MG IUD by Intrauterine route.  Marland Kitchen levothyroxine (SYNTHROID) 88 MCG tablet Take 1 tablet (88 mcg total) by mouth daily before breakfast.  . [DISCONTINUED] levothyroxine (SYNTHROID) 50 MCG tablet Take 1 tablet (50 mcg total) by mouth daily before breakfast.  . [DISCONTINUED] metroNIDAZOLE (FLAGYL) 500 MG tablet Take 1 tablet (500 mg total) by mouth 2 (two) times daily.   No facility-administered encounter medications on file  as of 06/09/2020.    ALLERGIES: No Known Allergies  VACCINATION STATUS: Immunization History  Administered Date(s) Administered  . Influenza,inj,Quad PF,6+ Mos 12/11/2014, 09/23/2018  . MMR 12/12/2014  . Pneumococcal Polysaccharide-23 07/12/2012, 12/12/2014, 08/17/2017  . Tdap 12/11/2014, 06/02/2017, 02/18/2019     HPI  Peggy [name] is 29 y.o. female who is being engaged in telehealth via telephone after she was given radioactive iodine thyroid ablation for Graves' disease.   She is s/p I-131 thyroid ablation treatment for hyperthyroidism from Graves' disease on February 01, 2020.  She reports feeling better, progressively gaining weight that she lost during thyrotoxicosis . - Her previsit thyroid function tests are consistent with treatment effect with RAI induced hypothyroidism, with  evidence of under replacement.  She denies palpitations, tremors, heat intolerance. she denies dysphagia, choking, shortness of breath, no recent voice change.  she reports thyroid cancer in her mother who had to undergo thyroidectomy and what appears to be radioactive iodine thyroid remnant ablation.   she denies personal history of goiter.                           Review of systems See the note from last visit.   Objective:    BP 121/78   Pulse 96   Ht 5' (1.524 m)   Wt 140 lb 9.6 oz (63.8 kg)   BMI 27.46 kg/m   Wt Readings from Last 3 Encounters:  06/09/20 140 lb 9.6 oz (63.8 kg)  05/18/20 137 lb 3.2 oz (62.2 kg)  05/01/20 130 lb (59 kg)                                                Physical exam  -See the note from last visit.   CMP     Component Value Date/Time   NA 136 04/10/2020 1724   K 3.8 04/10/2020 1724   CL 102 04/10/2020 1724   CO2 25 04/10/2020 1724   GLUCOSE 82 04/10/2020 1724   BUN 14 04/10/2020 1724   CREATININE 0.78 04/10/2020 1724   CALCIUM 8.7 (L) 04/10/2020 1724   PROT 8.6 (H) 04/10/2020 1724   ALBUMIN 5.0 04/10/2020 1724   AST 52 (H) 04/10/2020 1724   ALT 37 04/10/2020 1724   ALKPHOS 72 04/10/2020 1724   BILITOT 0.5 04/10/2020 1724   GFRNONAA >60 04/10/2020 1724   GFRAA >60 04/10/2020 1724    CBC    Component Value Date/Time   WBC 7.2 04/10/2020 1724   RBC 5.16 (H) 04/10/2020 1724   HGB 14.8 04/10/2020 1724   HGB 11.8 01/28/2019 0844   HCT 46.3 (H) 04/10/2020 1724   HCT 34.8 01/28/2019 0844   PLT 302 04/10/2020 1724   PLT 328 01/28/2019 0844   MCV 89.7 04/10/2020 1724   MCV 89 01/28/2019 0844   MCH 28.7 04/10/2020 1724   MCHC 32.0 04/10/2020 1724   RDW 15.9 (H) 04/10/2020 1724   RDW 12.7 01/28/2019 0844   LYMPHSABS 2.4 04/10/2020 1724   LYMPHSABS 2.7 09/25/2018 0847   MONOABS 0.3 04/10/2020 1724   EOSABS 0.2 04/10/2020 1724   EOSABS 0.3 09/25/2018 0847   BASOSABS 0.1 04/10/2020 1724   BASOSABS 0.0 09/25/2018 0847      Diabetic Labs (most recent): Lab Results  Component Value Date   HGBA1C 5.0 09/25/2018  Lab Results  Component Value Date   TSH 90.500 (H) 06/07/2020   TSH 85.200 (H) 03/27/2020   TSH <0.005 (L) 01/06/2020   TSH <0.005 (L) 11/30/2019   TSH 0.01 (A) 09/27/2019   TSH 0.001 (L) 08/17/2019   FREET4 0.38 (L) 06/07/2020   FREET4 <0.10 (L) 03/27/2020   FREET4 1.64 01/06/2020   FREET4 1.76 11/30/2019        Assessment & Plan:   -RAI induced hypothyroidism   -  She is status post I-131 thyroid ablation on February 01, 2020 for severe 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 She has responded with subsequent RAI induced hypothyroidism. -Her previsit thyroid function tests are consistent with under replacement.  I discussed and increase her levothyroxine to 88 mcg daily before breakfast.     - We discussed about the correct intake of her thyroid hormone, on empty stomach at fasting, with water, separated by at least 30 minutes from breakfast and other medications,  and separated by more than 4 hours from calcium, iron, multivitamins, acid reflux medications (PPIs). -Patient is made aware of the fact that thyroid hormone replacement is needed for life, dose to be adjusted by periodic monitoring of thyroid function tests.   -  Given her family history of thyroid malignancy, she will be considered for thyroid ultrasound in 1 year.    - I advised her to maintain close follow up with her new PMD , Dr. Benny Lennert.     - Time spent on this patient care encounter:  20 minutes of which 50% was spent in  counseling and the rest reviewing  her current and  previous labs / studies and medications  doses and developing a plan for long term care. Peggy [name]  participated in the discussions, expressed understanding, and voiced agreement with the above plans.  All questions were answered to her satisfaction. she is encouraged to contact clinic should she have any questions or concerns  prior to her return visit.   Follow up plan: Return in about 4 months (around 10/09/2020) for F/U with Pre-visit Labs.   Thank you for involving me in the care of this pleasant patient, and I will continue to update you with her progress.  Glade Lloyd, MD [facility] Phone: [phone]  Fax: [phone]   06/09/2020, 12:34 PM  This note was partially dictated with voice recognition software. Similar sounding words can be transcribed inadequately or may not  be corrected upon review.

## 2020-06-16 ENCOUNTER — Other Ambulatory Visit (HOSPITAL_COMMUNITY)
Admission: RE | Admit: 2020-06-16 | Discharge: 2020-06-16 | Disposition: A | Discharge status: Home / Self Care | Payer: Medicaid Other | Source: Ambulatory Visit | Attending: Adult Health | Admitting: Adult Health

## 2020-06-16 ENCOUNTER — Encounter: Payer: Self-pay | Admitting: Adult Health

## 2020-06-16 ENCOUNTER — Ambulatory Visit (INDEPENDENT_AMBULATORY_CARE_PROVIDER_SITE_OTHER): Payer: Medicaid Other | Admitting: Adult Health

## 2020-06-16 VITALS — BP 131/86 | HR 95 | Ht 60.0 in | Wt 141.0 lb

## 2020-06-16 DIAGNOSIS — Z01419 Encounter for gynecological examination (general) (routine) without abnormal findings: Secondary | ICD-10-CM | POA: Insufficient documentation

## 2020-06-16 DIAGNOSIS — Z0000 Encounter for general adult medical examination without abnormal findings: Secondary | ICD-10-CM | POA: Diagnosis not present

## 2020-06-16 DIAGNOSIS — Z975 Presence of (intrauterine) contraceptive device: Secondary | ICD-10-CM

## 2020-06-16 NOTE — Progress Notes (Signed)
Patient ID: Peggy [name], female   DOB: Nov 06, 1991, 29 y.o.   MRN: [id number] History of Present Illness: Peggy [name] is a 29 year old white female, divorced, G4P4 in for well woman gyn exam and pap.Has [place] IUD. PCP is [facility].    Current Medications, Allergies, Past Medical History, Past Surgical History, Family History and Social History were reviewed in [employer] record.     Review of Systems:  Patient denies any headaches, hearing loss, fatigue, blurred vision, shortness of breath, chest pain, abdominal pain, problems with bowel movements, urination, or intercourse. No joint pain or mood swings. Happy with IUD, may spot irregularly   Physical Exam:BP 131/86 (BP Location: Left Arm, Patient Position: Sitting, Cuff Size: Normal)   Pulse 95   Ht 5' (1.524 m)   Wt 141 lb (64 kg)   BMI 27.54 kg/m  General:  Well developed, well nourished, no acute distress Skin:  Warm and dry Neck:  Midline trachea, normal thyroid, good ROM, no lymphadenopathy Lungs; Clear to auscultation bilaterally Breast:  No dominant palpable mass, retraction, or nipple discharge,has bilateral nipple rods Cardiovascular: Regular rate and rhythm Abdomen:  Soft, non tender, no hepatosplenomegaly Pelvic:  External genitalia is normal in appearance, no lesions.  The vagina is normal in appearance,brown blood in vault. Urethra has no lesions or masses. The cervix is bulbous,+IUD strings at os, pap with GC/CHL and high risk HPV 16/18 genotyping performed.  Uterus is felt to be normal size, shape, and contour.  No adnexal masses or tenderness noted.Bladder is non tender, no masses felt. Extremities/musculoskeletal:  No swelling or varicosities noted, no clubbing or cyanosis Psych:  No mood changes, alert and cooperative,seems happy AA 1 Fall risk is low PHQ 9 score is 5, no SI denies any depression Examination chaperoned by Levy Pupa LPN  Impression and Plan: 1. Encounter for  gynecological examination with Papanicolaou smear of cervix Pap sent Physical in 1 year Pap in 3 if normal Labs with PCP or Dr Dorris Fetch   2. IUD (intrauterine device) in place Placed 05/28/2019

## 2020-06-21 LAB — CYTOLOGY - PAP
Chlamydia: NEGATIVE
Comment: NEGATIVE
Comment: NEGATIVE
Comment: NORMAL
High risk HPV: NEGATIVE
Neisseria Gonorrhea: NEGATIVE

## 2020-06-22 ENCOUNTER — Encounter: Payer: Self-pay | Admitting: Adult Health

## 2020-06-22 DIAGNOSIS — R87619 Unspecified abnormal cytological findings in specimens from cervix uteri: Secondary | ICD-10-CM

## 2020-06-22 HISTORY — DX: Unspecified abnormal cytological findings in specimens from cervix uteri: R87.619

## 2020-06-26 ENCOUNTER — Telehealth: Payer: Self-pay | Admitting: Adult Health

## 2020-06-26 MED ORDER — AMOXICILLIN-POT CLAVULANATE 875-125 MG PO TABS: 1.0000 | ORAL_TABLET | Freq: Two times a day (BID) | ORAL | 0 refills | Status: DC

## 2020-06-26 NOTE — Telephone Encounter (Signed)
Patient wanted to see if she can get someone to send in a prescription for a  breast infection, without her having to schedule and appt to come in.

## 2020-06-26 NOTE — Telephone Encounter (Signed)
Pt has nipple piercing. It's leaking puss and tender and sore. Pt is not breastfeeding but still leaks every once in a while. Has a 27 year old. Pt has to give a 2 weeks notice to have a day off. She is wondering if she can have a virtual visit. Please advise. Thanks!! [place]

## 2020-06-26 NOTE — Addendum Note (Signed)
Addended by: Derrek Monaco A on: 06/26/2020 11:51 AM   Modules accepted: Orders

## 2020-06-26 NOTE — Telephone Encounter (Signed)
[place] has stopped breast feeding and put nipple rods back in  And for right nipple red and warm and has redness on to breast, she does not want to take rod out, so clean around it good an will rx Augmentin and if not better in 3-4 days call back to be seen. Offered appt today and she declines has appts for kids today and tomorrow,

## 2020-07-18 ENCOUNTER — Other Ambulatory Visit: Payer: Medicaid Other

## 2020-07-19 ENCOUNTER — Other Ambulatory Visit (INDEPENDENT_AMBULATORY_CARE_PROVIDER_SITE_OTHER): Payer: Medicaid Other | Admitting: *Deleted

## 2020-07-19 ENCOUNTER — Other Ambulatory Visit: Payer: Self-pay

## 2020-07-19 ENCOUNTER — Other Ambulatory Visit (HOSPITAL_COMMUNITY)
Admission: RE | Admit: 2020-07-19 | Discharge: 2020-07-19 | Disposition: A | Discharge status: Home / Self Care | Payer: Medicaid Other | Source: Ambulatory Visit | Attending: Obstetrics & Gynecology | Admitting: Obstetrics & Gynecology

## 2020-07-19 ENCOUNTER — Telehealth: Payer: Self-pay | Admitting: Adult Health

## 2020-07-19 DIAGNOSIS — N898 Other specified noninflammatory disorders of vagina: Secondary | ICD-10-CM | POA: Diagnosis not present

## 2020-07-19 DIAGNOSIS — Z113 Encounter for screening for infections with a predominantly sexual mode of transmission: Secondary | ICD-10-CM

## 2020-07-19 DIAGNOSIS — N921 Excessive and frequent menstruation with irregular cycle: Secondary | ICD-10-CM | POA: Insufficient documentation

## 2020-07-19 NOTE — Progress Notes (Signed)
Chart reviewed for nurse visit. Agree with plan of care.  Estill Dooms, NP 07/19/2020 2:43 PM

## 2020-07-19 NOTE — Telephone Encounter (Signed)
Patient missed std self swab nurse visit yesterday. Patient called this morning to r/s appointment offered patient next available on the nurse schedule which is 07/25/20 patient declined stated that this was emergent and would like to be worked in for appointment today. Please advise if we should schedule patient sooner.

## 2020-07-19 NOTE — Progress Notes (Signed)
   NURSE VISIT- STD  SUBJECTIVE:  Peggy [name] is a 29 y.o. [phone] GYN patientfemale here for a vaginal swab for STD screen.  She reports the following symptoms: abnormal bleeding: flow is moderate and discharge described as mucous for 2 weeks. Denies abnormal vaginal bleeding, significant pelvic pain, fever, or UTI symptoms.  OBJECTIVE:  There were no vitals taken for this visit.  Appears well, in no apparent distress  ASSESSMENT: Vaginal swab for STD screen  PLAN: Self-collected vaginal probe for Gonorrhea, Chlamydia, Trichomonas sent to lab Treatment: to be determined once results are received Follow-up as needed if symptoms persist/worsen, or new symptoms develop  Peggy [name]  07/19/2020 2:03 PM

## 2020-07-24 LAB — CERVICOVAGINAL ANCILLARY ONLY
Chlamydia: NEGATIVE
Comment: NEGATIVE
Comment: NEGATIVE
Comment: NORMAL
Neisseria Gonorrhea: NEGATIVE
Trichomonas: NEGATIVE

## 2020-08-14 DIAGNOSIS — Z13220 Encounter for screening for lipoid disorders: Secondary | ICD-10-CM | POA: Diagnosis not present

## 2020-08-14 DIAGNOSIS — Z0001 Encounter for general adult medical examination with abnormal findings: Secondary | ICD-10-CM | POA: Diagnosis not present

## 2020-08-14 DIAGNOSIS — E039 Hypothyroidism, unspecified: Secondary | ICD-10-CM | POA: Diagnosis not present

## 2020-08-14 DIAGNOSIS — E0500 Thyrotoxicosis with diffuse goiter without thyrotoxic crisis or storm: Secondary | ICD-10-CM | POA: Diagnosis not present

## 2020-08-17 DIAGNOSIS — E039 Hypothyroidism, unspecified: Secondary | ICD-10-CM | POA: Diagnosis not present

## 2020-08-17 DIAGNOSIS — L509 Urticaria, unspecified: Secondary | ICD-10-CM | POA: Diagnosis not present

## 2020-08-17 DIAGNOSIS — N63 Unspecified lump in unspecified breast: Secondary | ICD-10-CM | POA: Diagnosis not present

## 2020-08-17 DIAGNOSIS — Z8742 Personal history of other diseases of the female genital tract: Secondary | ICD-10-CM | POA: Diagnosis not present

## 2020-08-17 DIAGNOSIS — N938 Other specified abnormal uterine and vaginal bleeding: Secondary | ICD-10-CM | POA: Diagnosis not present

## 2020-08-17 DIAGNOSIS — E0500 Thyrotoxicosis with diffuse goiter without thyrotoxic crisis or storm: Secondary | ICD-10-CM | POA: Diagnosis not present

## 2020-08-17 DIAGNOSIS — Z0001 Encounter for general adult medical examination with abnormal findings: Secondary | ICD-10-CM | POA: Diagnosis not present

## 2020-08-25 DIAGNOSIS — Z20828 Contact with and (suspected) exposure to other viral communicable diseases: Secondary | ICD-10-CM | POA: Diagnosis not present

## 2020-08-27 ENCOUNTER — Emergency Department (HOSPITAL_COMMUNITY): Payer: Medicaid Other

## 2020-08-27 ENCOUNTER — Emergency Department (HOSPITAL_COMMUNITY)
Admission: EM | Admit: 2020-08-27 | Discharge: 2020-08-27 | Disposition: A | Discharge status: Home / Self Care | Payer: Medicaid Other | Attending: Emergency Medicine | Admitting: Emergency Medicine

## 2020-08-27 ENCOUNTER — Encounter (HOSPITAL_COMMUNITY): Payer: Self-pay | Admitting: Emergency Medicine

## 2020-08-27 ENCOUNTER — Other Ambulatory Visit: Payer: Self-pay

## 2020-08-27 DIAGNOSIS — Z7951 Long term (current) use of inhaled steroids: Secondary | ICD-10-CM | POA: Diagnosis not present

## 2020-08-27 DIAGNOSIS — U071 COVID-19: Secondary | ICD-10-CM | POA: Diagnosis not present

## 2020-08-27 DIAGNOSIS — F17210 Nicotine dependence, cigarettes, uncomplicated: Secondary | ICD-10-CM | POA: Diagnosis not present

## 2020-08-27 DIAGNOSIS — J45909 Unspecified asthma, uncomplicated: Secondary | ICD-10-CM | POA: Diagnosis not present

## 2020-08-27 DIAGNOSIS — R0602 Shortness of breath: Secondary | ICD-10-CM | POA: Diagnosis present

## 2020-08-27 DIAGNOSIS — I10 Essential (primary) hypertension: Secondary | ICD-10-CM | POA: Diagnosis not present

## 2020-08-27 DIAGNOSIS — R079 Chest pain, unspecified: Secondary | ICD-10-CM | POA: Insufficient documentation

## 2020-08-27 DIAGNOSIS — E039 Hypothyroidism, unspecified: Secondary | ICD-10-CM | POA: Diagnosis not present

## 2020-08-27 LAB — URINALYSIS, ROUTINE W REFLEX MICROSCOPIC
Bacteria, UA: NONE SEEN
Bilirubin Urine: NEGATIVE
Glucose, UA: NEGATIVE mg/dL
Hgb urine dipstick: NEGATIVE
Ketones, ur: 5 mg/dL — AB
Leukocytes,Ua: NEGATIVE
Nitrite: NEGATIVE
Protein, ur: NEGATIVE mg/dL
Specific Gravity, Urine: 1.024 (ref 1.005–1.030)
pH: 9 — ABNORMAL HIGH (ref 5.0–8.0)

## 2020-08-27 LAB — CBC WITH DIFFERENTIAL/PLATELET
Abs Immature Granulocytes: 0.01 10*3/uL (ref 0.00–0.07)
Basophils Absolute: 0.1 10*3/uL (ref 0.0–0.1)
Basophils Relative: 1 %
Eosinophils Absolute: 0.2 10*3/uL (ref 0.0–0.5)
Eosinophils Relative: 5 %
HCT: 40.9 % (ref 36.0–46.0)
Hemoglobin: 13.4 g/dL (ref 12.0–15.0)
Immature Granulocytes: 0 %
Lymphocytes Relative: 42 %
Lymphs Abs: 2 10*3/uL (ref 0.7–4.0)
MCH: 31.6 pg (ref 26.0–34.0)
MCHC: 32.8 g/dL (ref 30.0–36.0)
MCV: 96.5 fL (ref 80.0–100.0)
Monocytes Absolute: 0.3 10*3/uL (ref 0.1–1.0)
Monocytes Relative: 7 %
Neutro Abs: 2.2 10*3/uL (ref 1.7–7.7)
Neutrophils Relative %: 45 %
Platelets: 249 10*3/uL (ref 150–400)
RBC: 4.24 MIL/uL (ref 3.87–5.11)
RDW: 13.3 % (ref 11.5–15.5)
WBC: 4.9 10*3/uL (ref 4.0–10.5)
nRBC: 0 % (ref 0.0–0.2)

## 2020-08-27 LAB — BASIC METABOLIC PANEL
Anion gap: 10 (ref 5–15)
BUN: 15 mg/dL (ref 6–20)
CO2: 23 mmol/L (ref 22–32)
Calcium: 8.9 mg/dL (ref 8.9–10.3)
Chloride: 106 mmol/L (ref 98–111)
Creatinine, Ser: 0.86 mg/dL (ref 0.44–1.00)
GFR calc Af Amer: >60 mL/min (ref >60)
GFR calc non Af Amer: >60 mL/min (ref >60)
Glucose, Bld: 86 mg/dL (ref 70–99)
Potassium: 3.9 mmol/L (ref 3.5–5.1)
Sodium: 139 mmol/L (ref 135–145)

## 2020-08-27 LAB — PREGNANCY, URINE: Preg Test, Ur: NEGATIVE

## 2020-08-27 LAB — SARS CORONAVIRUS 2 BY RT PCR (HOSPITAL ORDER, PERFORMED IN ~~LOC~~ HOSPITAL LAB): SARS Coronavirus 2: POSITIVE — AB

## 2020-08-27 MED ORDER — ONDANSETRON 8 MG PO TBDP
8.0000 mg | ORAL_TABLET | Freq: Once | ORAL | Status: AC
  Administered 2020-08-27: 8 mg via ORAL
  Filled 2020-08-27: qty 1

## 2020-08-27 MED ORDER — BENZONATATE 200 MG PO CAPS: 200.0000 mg | ORAL_CAPSULE | Freq: Three times a day (TID) | ORAL | 0 refills | Status: DC | PRN

## 2020-08-27 MED ORDER — MECLIZINE HCL 12.5 MG PO TABS
25.0000 mg | ORAL_TABLET | Freq: Once | ORAL | Status: AC
  Administered 2020-08-27: 25 mg via ORAL
  Filled 2020-08-27: qty 2

## 2020-08-27 MED ORDER — ALBUTEROL SULFATE (2.5 MG/3ML) 0.083% IN NEBU: 2.5000 mg | INHALATION_SOLUTION | Freq: Four times a day (QID) | RESPIRATORY_TRACT | 0 refills | Status: DC | PRN

## 2020-08-27 NOTE — Discharge Instructions (Addendum)
Your Covid test today was positive.  You will need to quarantine at home for 10 days or until you are at least 48 hours without fever or other symptoms without taking Tylenol or ibuprofen or other medication to reduce your fever.  You may continue to use your albuterol nebulizer every 4-6 hours as needed.  Follow-up next week with your primary care provider for recheck.  Return to the emergency department if you develop worsening symptoms such as significant chest pain or shortness of breath that is persistent despite using your nebulizer.

## 2020-08-27 NOTE — ED Provider Notes (Signed)
[facility] EMERGENCY DEPARTMENT Provider Note   CSN: [id number] Arrival date & time: 08/27/20  1305     History Chief Complaint  Patient presents with  . Shortness of Breath  . Chest Pain    Peggy [name] is a 29 y.o. female.  HPI      Peggy [name] is a 29 y.o. female who presents to the Emergency Department complaining of generalized weakness, nausea, intermittent chest pain and dizziness with position change.  Symptoms began 3 days ago.  She states that when she stands or moves suddenly she feels as though she is going to pass out.  She complains of intermittent pain in her chest that she describes as pressure and diffuse headache.  No visual change, neck pain or stiffness.  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  Her symptoms have been associated with decreased appetite and chills, without fever, vomiting or diarrhea.  No abdominal pain or shortness of breath.  No dysuria or syncope.    Past Medical History:  Diagnosis Date  . Abnormal Pap smear of cervix 06/22/2020   05/2020 pap LSIL negative HPV and GC/CHL  As per ASCCP guidelines repeat in 1 year, 5 year risk of CIN 3 + is 2%  . Asthma   . Gestational diabetes   . Pyloric stenosis   . Thyroid disease    Graves    Patient Active Problem List   Diagnosis Date Noted  . Abnormal Pap smear of cervix 06/22/2020  . Encounter for gynecological examination with Papanicolaou smear of cervix 06/16/2020  . 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/09/2020  . Screening examination for STD (sexually transmitted disease) 05/18/2020  . IUD (intrauterine device) in place 05/18/2020  . Nausea without vomiting 03/24/2020  . Graves' disease 01/24/2020  . Hyperthyroidism 01/10/2020  . Encounter for initial prescription of intrauterine contraceptive device (IUD) 05/25/2019  .  Depression screening 05/25/2019  . Encounter for postpartum visit 05/25/2019  . Indication for care in labor or delivery 04/26/2019  . Group beta Strep positive 04/26/2019  . SVD (spontaneous vaginal delivery) 04/26/2019  . History of gestational diabetes 10/28/2018  . Low vitamin D level 02/12/2017  . Smoker 07/25/2014    Past Surgical History:  Procedure Laterality Date  . PYLOROMYOTOMY       OB History    Gravida  4   Para  4   Term  4   Preterm  0   AB  0   Living  4     SAB  0   TAB  0   Ectopic  0   Multiple  0   Live Births  4           Family History  Problem Relation Age of Onset  . Arthritis Mother   . Cancer Mother        thyroid  . Depression Mother   . Hyperlipidemia Father   . Asthma Father   . Hypertension Father   . Heart disease Father   . COPD Maternal Grandfather   . Diabetes Paternal Grandmother   . Arthritis Paternal Grandmother   . Asthma Paternal Grandmother   . Heart disease Paternal Grandmother   . Depression Paternal Grandmother   . Hypertension Paternal Grandmother   . Hyperlipidemia Paternal Grandmother   . Other Neg Hx  Social History   Tobacco Use  . Smoking status: Current Every Day Smoker    Packs/day: 0.25    Years: 5.00    Pack years: 1.25    Types: Cigarettes  . Smokeless tobacco: Never Used  . Tobacco comment: 2 cig/day  Vaping Use  . Vaping Use: Never used  Substance Use Topics  . Alcohol use: Yes    Comment: rarely  . Drug use: No    Home Medications Prior to Admission medications   Medication Sig Start Date End Date Taking? Authorizing Provider  albuterol (VENTOLIN HFA) 108 (90 Base) MCG/ACT inhaler TAKE 2 PUFFS BY MOUTH EVERY 6 HOURS AS NEEDED FOR WHEEZE OR SHORTNESS OF BREATH 11/29/19   Cresenzo-Dishmon, Joaquim Lai, CNM  amoxicillin-clavulanate (AUGMENTIN) 875-125 MG tablet Take 1 tablet by mouth 2 (two) times daily. 06/26/20   Estill Dooms, NP  Levonorgestrel (KYLEENA) 19.5 MG IUD  by Intrauterine route.    [provider]  levothyroxine (SYNTHROID) 88 MCG tablet Take 1 tablet (88 mcg total) by mouth daily before breakfast. 06/09/20   Nida, Marella Chimes, MD    Allergies    Patient has no known allergies.  Review of Systems   Review of Systems  Constitutional: Positive for appetite change and chills. Negative for fever.  HENT: Negative for congestion, sore throat and trouble swallowing.   Respiratory: Positive for chest tightness. Negative for cough, shortness of breath and wheezing.   Cardiovascular: Positive for chest pain.  Gastrointestinal: Positive for nausea. Negative for abdominal pain, diarrhea and vomiting.  Genitourinary: Negative for dysuria and flank pain.  Musculoskeletal: Negative for arthralgias.  Skin: Negative for color change and rash.  Neurological: Positive for dizziness and headaches. Negative for syncope, weakness and numbness.  Hematological: Negative for adenopathy.  Psychiatric/Behavioral: Negative for confusion.    Physical Exam Updated Vital Signs BP 117/83 (BP Location: Right Arm)   Pulse 83   Temp 99.6 F (37.6 C) (Oral)   Resp 18   Ht 5' (1.524 m)   Wt 63.5 kg   LMP 08/27/2020 (Exact Date)   SpO2 100%   BMI 27.34 kg/m   Physical Exam Vitals and nursing note reviewed.  Constitutional:      Appearance: She is well-developed. She is not ill-appearing.  HENT:     Right Ear: Tympanic membrane and ear canal normal.     Left Ear: Tympanic membrane and ear canal normal.  Eyes:     Extraocular Movements: Extraocular movements intact.     Conjunctiva/sclera: Conjunctivae normal.     Pupils: Pupils are equal, round, and reactive to light.  Neck:     Meningeal: Kernig's sign absent.  Cardiovascular:     Rate and Rhythm: Normal rate and regular rhythm.     Pulses: Normal pulses.  Pulmonary:     Effort: Pulmonary effort is normal.     Breath sounds: Wheezing (Few scattered expiratory wheezes.  No rales or rhonchi.)  present.  Abdominal:     General: There is no distension.     Palpations: Abdomen is soft.     Tenderness: There is no right CVA tenderness or left CVA tenderness.  Musculoskeletal:        General: Normal range of motion.     Cervical back: Normal range of motion. No rigidity.  Lymphadenopathy:     Cervical: No cervical adenopathy.  Skin:    General: Skin is warm.     Capillary Refill: Capillary refill takes less than 2 seconds.  Findings: No rash.  Neurological:     General: No focal deficit present.     Mental Status: She is alert.     Sensory: Sensation is intact. No sensory deficit.     Motor: Motor function is intact. No weakness.     Coordination: Coordination is intact.     Gait: Gait is intact.     Comments: CN III through XII grossly intact.  Speech clear.  Patient ambulatory with steady gait.     ED Results / Procedures / Treatments   Labs (all labs ordered are listed, but only abnormal results are displayed) Labs Reviewed  SARS CORONAVIRUS 2 BY RT PCR ([facility]) - Abnormal; Notable for the following components:      Result Value   SARS Coronavirus 2 POSITIVE (*)    All other components within normal limits  URINALYSIS, ROUTINE W REFLEX MICROSCOPIC - Abnormal; Notable for the following components:   APPearance HAZY (*)    pH 9.0 (*)    Ketones, ur 5 (*)    All other components within normal limits  PREGNANCY, URINE  BASIC METABOLIC PANEL  CBC WITH DIFFERENTIAL/PLATELET    EKG EKG Interpretation  Date/Time:  Sunday August 27 2020 14:27:21 EDT Ventricular Rate:  69 PR Interval:    QRS Duration: 82 QT Interval:  394 QTC Calculation: 423 R Axis:   76 Text Interpretation: Sinus rhythm Baseline wander in lead(s) V6 Confirmed by Noemi Chapel [phone]) on 08/27/2020 4:29:56 PM   Radiology DG Chest Portable 1 View  Result Date: 08/27/2020 CLINICAL DATA:  Fever, chest tightness, chest pain, history asthma and  smoking. Awaiting COVID-19 results EXAM: PORTABLE CHEST 1 VIEW COMPARISON:  Portable exam 1419 hours compared to 05/01/2020 FINDINGS: Normal heart size, mediastinal contours, and pulmonary vascularity. Lungs hyperinflated with minimal peribronchial thickening, likely reflecting history of asthma. No acute infiltrate, pleural effusion or pneumothorax. Osseous structures unremarkable. IMPRESSION: Peribronchial thickening and hyperinflation question due to asthma. No acute infiltrate. Electronically Signed   By: Lavonia Dana M.D.   On: 08/27/2020 14:52    Procedures Procedures (including critical care time)  Medications Ordered in ED Medications  ondansetron (ZOFRAN-ODT) disintegrating tablet 8 mg (8 mg Oral Given 08/27/20 1433)  meclizine (ANTIVERT) tablet 25 mg (25 mg Oral Given 08/27/20 1605)    ED Course  I have reviewed the triage vital signs and the nursing notes.  Pertinent labs & imaging results that were available during my care of the patient were reviewed by me and considered in my medical decision making (see chart for details).   Orthostatic VS for the past 24 hrs (Last 3 readings):  BP- Lying Pulse- Lying BP- Sitting Pulse- Sitting BP- Standing at 0 minutes Pulse- Standing at 0 minutes  08/27/20 1427 107/78 68 118/78 71 113/86 78        MDM Rules/Calculators/A&P                          Patient here with symptoms of positional dizziness, nausea, chest pain, and fatigue.  She is nontoxic-appearing.  Vital signs reviewed.  No hypoxia or tachycardia.  Symptoms are concerning for viral infection specifically Covid.  Will obtain chest x-ray, labs, urinalysis and Covid test.  Chest x-ray without evidence for pneumonia.  Covid test is positive.  Remaining labs unremarkable.  Pregnancy test negative. EKG reassuring.  Vitals reassuring.  No tachycardia or tachypnea.  No hypoxia.  On recheck, she  reports feeling better.  She is ambulated in the department with steady gait.  No focal  neuro deficits on my exam.  She has tolerated oral fluids.  Patient's O2 sats have remained at 100% during ER stay.  I have discussed her Covid positive results with her and the need for her to quarantine at home.  She is verbalized understanding of this.  She has an albuterol nebulizer at home, I will refill her albuterol vials.  I feel that she is appropriate for discharge home although I have recommended close outpatient follow-up with her PCP she agrees to plan.  Strict return precautions were also discussed.  MOMO BRAUN was evaluated in Emergency Department on 08/27/2020 for the symptoms described in the history of present illness. She was evaluated in the context of the global COVID-19 pandemic, which necessitated consideration that the patient might be at risk for infection with the SARS-CoV-2 virus that causes COVID-19. Institutional protocols and algorithms that pertain to the evaluation of patients at risk for COVID-19 are in a state of rapid change based on information released by regulatory bodies including the CDC and federal and state organizations. These policies and algorithms were followed during the patient's care in the ED.   Final Clinical Impression(s) / ED Diagnoses Final diagnoses:  COVID-19 virus infection    Rx / DC Orders ED Discharge Orders    None       Kem Parkinson, PA-C 08/27/20 1706    Davonna Belling, MD 08/28/20 1444

## 2020-08-27 NOTE — ED Triage Notes (Signed)
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

## 2020-08-27 NOTE — ED Triage Notes (Signed)
Dizzy feels that she cannot go from one room to next without passing out x 3 days   VS stable   No covid vaccine  also reports cp

## 2020-08-27 NOTE — ED Notes (Signed)
CRITICAL VALUE ALERT  Critical Value:  SARS2 Positive  Date & Time Notied:  08/27/2020 @ 8873  Provider Notified: Dr Sabra Heck  Orders Received/Actions taken: pt on precautions.

## 2020-08-28 ENCOUNTER — Telehealth: Payer: Self-pay | Admitting: Infectious Diseases

## 2020-08-28 ENCOUNTER — Telehealth: Payer: Self-pay | Admitting: *Deleted

## 2020-08-28 NOTE — Telephone Encounter (Signed)
Contacted patient to complete transition of care assessment. The patient does not wish to provide information to very that this is her correct chart. She states "i'm fine" and disconnects call. Unable to complete transition of care assessment.  Lenor Coffin, RN, BSN, [place] [facility] [place] [phone]

## 2020-08-28 NOTE — Telephone Encounter (Signed)
Called to Discuss with patient about Covid symptoms and the use of the monoclonal antibody infusion for those with mild to moderate Covid symptoms and at a high risk of hospitalization.     Pt appears to qualify for this infusion due to co-morbid conditions and/or a member of an at-risk group in accordance with the FDA Emergency Use Authorization.    Unable to reach pt  LVM and MyChart sent.   Seen in the ER 8/30 w/ 3d hx symptoms  BMI> 25.    Janene Madeira, MSN, NP-C [facility] [email or web address] Pager: [phone] Office: [phone] [place]: [phone]

## 2020-09-13 DIAGNOSIS — N6489 Other specified disorders of breast: Secondary | ICD-10-CM | POA: Diagnosis not present

## 2020-09-13 DIAGNOSIS — Z9889 Other specified postprocedural states: Secondary | ICD-10-CM | POA: Diagnosis not present

## 2020-09-13 DIAGNOSIS — N6001 Solitary cyst of right breast: Secondary | ICD-10-CM | POA: Diagnosis not present

## 2020-09-15 ENCOUNTER — Encounter: Payer: Self-pay | Admitting: Women's Health

## 2020-09-15 ENCOUNTER — Other Ambulatory Visit (HOSPITAL_COMMUNITY)
Admission: RE | Admit: 2020-09-15 | Discharge: 2020-09-15 | Disposition: A | Discharge status: Home / Self Care | Payer: Medicaid Other | Source: Ambulatory Visit | Attending: Obstetrics & Gynecology | Admitting: Obstetrics & Gynecology

## 2020-09-15 ENCOUNTER — Ambulatory Visit (INDEPENDENT_AMBULATORY_CARE_PROVIDER_SITE_OTHER): Payer: Medicaid Other | Admitting: Women's Health

## 2020-09-15 VITALS — BP 132/89 | HR 89 | Ht 60.0 in | Wt 146.4 lb

## 2020-09-15 DIAGNOSIS — Z30432 Encounter for removal of intrauterine contraceptive device: Secondary | ICD-10-CM | POA: Diagnosis not present

## 2020-09-15 DIAGNOSIS — Z3202 Encounter for pregnancy test, result negative: Secondary | ICD-10-CM

## 2020-09-15 DIAGNOSIS — N898 Other specified noninflammatory disorders of vagina: Secondary | ICD-10-CM | POA: Insufficient documentation

## 2020-09-15 LAB — POCT URINE PREGNANCY: Preg Test, Ur: NEGATIVE

## 2020-09-15 MED ORDER — ETONOGESTREL-ETHINYL ESTRADIOL 0.12-0.015 MG/24HR VA RING: VAGINAL_RING | VAGINAL | 3 refills | Status: DC

## 2020-09-15 NOTE — Patient Instructions (Signed)
Ethinyl Estradiol; Etonogestrel vaginal ring What is this medicine? ETHINYL ESTRADIOL; ETONOGESTREL (ETH in il es tra DYE ole; et oh noe JES trel) vaginal ring is a flexible, vaginal ring used as a contraceptive (birth control method). This medicine combines 2 types of female hormones, an estrogen and a progestin. This ring is used to prevent ovulation and pregnancy. Each ring is effective for 1 month. This medicine may be used for other purposes; ask your health care provider or pharmacist if you have questions. COMMON BRAND NAME(S): EluRyng, NuvaRing What should I tell my health care provider before I take this medicine? They need to know if you have any of these conditions:  abnormal vaginal bleeding  blood vessel disease or blood clots  breast, cervical, endometrial, ovarian, liver, or uterine cancer  diabetes  gallbladder disease  having surgery  heart disease or recent heart attack  high blood pressure  high cholesterol or triglycerides  history of irregular heartbeat or heart valve problems  kidney disease  liver disease  migraine headaches  protein C deficiency  protein S deficiency  recently had a baby, miscarriage, or abortion  stroke  systemic lupus erythematosus (SLE)  tobacco smoker  your age is more than 27 years old  an unusual or allergic reaction to estrogens, progestins, other medicines, foods, dyes, or preservatives  pregnant or trying to get pregnant  breast-feeding How should I use this medicine? Insert the ring into your vagina as directed. Follow the directions on the prescription label. The ring will remain place for 3 weeks and is then removed for a 1-week break. A new ring is inserted 1 week after the last ring was removed, on the same day of the week. Check often to make sure the ring is still in place. If the ring was out of the vagina for an unknown amount of time, you may not be protected from pregnancy. Perform a pregnancy test and  call your doctor. Do not use more often than directed. A patient package insert for the product will be given with each prescription and refill. Read this sheet carefully each time. The sheet may change frequently. Contact your pediatrician regarding the use of this medicine in children. Special care may be needed. Overdosage: If you think you have taken too much of this medicine contact a poison control center or emergency room at once. NOTE: This medicine is only for you. Do not share this medicine with others. What if I miss a dose? You will need to use the ring exactly as directed. It is very important to follow the schedule every cycle. If you do not use the ring as directed, you may not be protected from pregnancy. If the ring should slip out, is lost, or if you leave it in longer or shorter than you should, contact your health care professional for advice. What may interact with this medicine? Do not take this medicine with the following medications:  dasabuvir; ombitasvir; paritaprevir; ritonavir  ombitasvir; paritaprevir; ritonavir  vaginal lubricants or other vaginal products that are oil-based or silicone-based This medicine may also interact with the following medications:  acetaminophen  antibiotics or medicines for infections, especially rifampin, rifabutin, rifapentine, and griseofulvin, and possibly penicillins or tetracyclines  aprepitant or fosaprepitant  armodafinil  ascorbic acid (vitamin C)  barbiturate medicines, such as phenobarbital or primidone  bosentan  certain antiviral medicines for hepatitis, HIV or AIDS  certain medicines for cancer treatment  certain medicines for seizures like carbamazepine, clobazam, felbamate, lamotrigine, oxcarbazepine, phenytoin,   rufinamide, topiramate  certain medicines for treating high cholesterol  cyclosporine  dantrolene  elagolix  flibanserin  grapefruit juice  lesinurad  medicines for diabetes  medicines  to treat fungal infections, such as griseofulvin, miconazole, fluconazole, ketoconazole, itraconazole, posaconazole or voriconazole  mifepristone  mitotane  modafinil  morphine  mycophenolate  St. John's wort  tamoxifen  temazepam  theophylline or aminophylline  thyroid hormones  tizanidine  tranexamic acid  ulipristal  warfarin This list may not describe all possible interactions. Give your health care provider a list of all the medicines, herbs, non-prescription drugs, or dietary supplements you use. Also tell them if you smoke, drink alcohol, or use illegal drugs. Some items may interact with your medicine. What should I watch for while using this medicine? Visit your doctor or health care professional for regular checks on your progress. You will need a regular breast and pelvic exam and Pap smear while on this medicine. Check with your doctor or health care professional to see if you need an additional method of contraception during the first cycle that you use this ring. Female condoms (made with natural rubber latex, polyisoprene, and polyurethane) and spermicides may be used. Do not use a diaphragm, cervical cap, or a female condom, as the ring can interfere with these birth control methods and their proper placement. If you have any reason to think you are pregnant, stop using this medicine right away and contact your doctor or health care professional. If you are using this medicine for hormone related problems, it may take several cycles of use to see improvement in your condition. Smoking increases the risk of getting a blood clot or having a stroke while you are using hormonal birth control, especially if you are more than 27 years old. You are strongly advised not to smoke. Some women are prone to getting dark patches on the skin of the face (cholasma). Your risk of getting chloasma with this medicine is higher if you had chloasma during a pregnancy. Keep out of the  sun. If you cannot avoid being in the sun, wear protective clothing and use sunscreen. Do not use sun lamps or tanning beds/booths. This medicine can make your body retain fluid, making your fingers, hands, or ankles swell. Your blood pressure can go up. Contact your doctor or health care professional if you feel you are retaining fluid. If you are going to have elective surgery, you may need to stop using this medicine before the surgery. Consult your health care professional for advice. This medicine does not protect you against HIV infection (AIDS) or any other sexually transmitted diseases. What side effects may I notice from receiving this medicine? Side effects that you should report to your doctor or health care professional as soon as possible:  allergic reactions such as skin rash or itching, hives, swelling of the lips, mouth, tongue, or throat  depression  high blood pressure  migraines or severe, sudden headaches  signs and symptoms of a blood clot such as breathing problems; changes in vision; chest pain; severe, sudden headache; pain, swelling, warmth in the leg; trouble speaking; sudden numbness or weakness of the face, arm or leg  signs and symptoms of infection like fever or chills with dizziness and a sunburn-like rash, or pain or trouble passing urine  stomach pain  symptoms of vaginal infection like itching, irritation or unusual discharge  yellowing of the eyes or skin Side effects that usually do not require medical attention (report these to your doctor   or health care professional if they continue or are bothersome):  acne  breast pain, tenderness  irregular vaginal bleeding or spotting, particularly during the first month of use  mild headache  nausea  painful periods  vomiting This list may not describe all possible side effects. Call your doctor for medical advice about side effects. You may report side effects to FDA at 1-800-FDA-1088. Where should I  keep my medicine? Keep out of the reach of children. Store unopened medicine for up to 4 months at room temperature at 15 and 30 degrees C (59 and 86 degrees F). Protect from light. Do not store above 30 degrees C (86 degrees F). Throw away any unused medicine 4 months after the dispense date or the expiration date, whichever comes first. A ring may only be used for 1 cycle (1 month). After the 3-week cycle, a used ring is removed and should be placed in the re-closable foil pouch and discarded in the trash out of reach of children and pets. Do NOT flush down the toilet. NOTE: This sheet is a summary. It may not cover all possible information. If you have questions about this medicine, talk to your doctor, pharmacist, or health care provider.  2020 Elsevier/Gold Standard (2019-07-08 12:31:47)  

## 2020-09-15 NOTE — Progress Notes (Signed)
   IUD REMOVAL  Patient name: Peggy [name] MRN [id number]  Date of birth: Jul 31, 1991 Subjective Findings:   Peggy [name] is a 29 y.o. [phone] Caucasian female being seen today for removal of a [place]  IUD. Her IUD was placed 05/28/19.  She desires removal because of irregular bleeding, declines trial of megace. Signed copy of informed consent in chart.  Wants testing for BV, has low back pain that she usually has w/ BV. Some vaginal d/c, denies itching/irritation/odor.  Depression screen [facility] 2/9 06/16/2020 10/21/2019 09/23/2018 11/23/2014 11/16/2014  Decreased Interest 1 0 0 0 0  Down, Depressed, Hopeless 0 0 0 0 0  PHQ - 2 Score 1 0 0 0 0  Altered sleeping 1 - 3 - -  Tired, decreased energy 1 - 3 - -  Change in appetite 1 - 3 - -  Feeling bad or failure about yourself  0 - 0 - -  Trouble concentrating 1 - 2 - -  Moving slowly or fidgety/restless 0 - 0 - -  Suicidal thoughts 0 - 0 - -  PHQ-9 Score 5 - 11 - -  Difficult doing work/chores Not difficult at all - - - -  Some recent data might be hidden    Patient's last menstrual period was 08/27/2020 (exact date). Last pap6/18/21. Results were:  abnormal  LSIL w/ -HRHPV The planned method of family planning is NuvaRing vaginal inserts. Did well w/ them in the past. No h/o HTN, DVT/PE, CVA, MI, or migraines w/ aura. Does smoke 3-4 cigarettes/day Pertinent History Reviewed:   Reviewed past medical,surgical, social, obstetrical and family history.  Reviewed problem list, medications and allergies. Objective Findings & Procedure:    Vitals:   09/15/20 1039  BP: 132/89  Pulse: 89  Weight: 146 lb 6.4 oz (66.4 kg)  Height: 5' (1.524 m)  Body mass index is 28.59 kg/m.  Results for orders placed or performed in visit on 09/15/20 (from the past 24 hour(s))  POCT urine pregnancy   Collection Time: 09/15/20 11:00 AM  Result Value Ref Range   Preg Test, Ur Negative Negative     Time out was performed.  A pederson speculum was  placed in the vagina.  The cervix was visualized, and the strings were visible. They were grasped and the [facility]  IUD was easily removed intact without complications. The patient tolerated the procedure well.   Chaperone: Levy Pupa   Assessment & Plan:   1) [place]  IUD removal Follow-up prn problems  2) Contraception management> rx Nuvaring, condoms x 2wks, f/u 81mths. Advised smoking cessation, discussed increased r/f DVT/PE, MI/CVA/HTN w/ estrogen+smoking.   3) Vag d/c w/ low back pain> CV swab sent  Orders Placed This Encounter  Procedures  . POCT urine pregnancy    Follow-up: Return in about 3 months (around 12/15/2020) for F/U, in person, CNM.  Roma Schanz CNM, [facility] 09/15/2020 11:19 AM

## 2020-09-18 ENCOUNTER — Other Ambulatory Visit: Payer: Self-pay | Admitting: Advanced Practice Midwife

## 2020-09-18 LAB — CERVICOVAGINAL ANCILLARY ONLY
Bacterial Vaginitis (gardnerella): NEGATIVE
Candida Glabrata: NEGATIVE
Candida Vaginitis: NEGATIVE
Chlamydia: NEGATIVE
Comment: NEGATIVE
Comment: NEGATIVE
Comment: NEGATIVE
Comment: NEGATIVE
Comment: NEGATIVE
Comment: NORMAL
Neisseria Gonorrhea: NEGATIVE
Trichomonas: NEGATIVE

## 2020-09-25 ENCOUNTER — Encounter (HOSPITAL_COMMUNITY): Payer: Self-pay | Admitting: Physical Therapy

## 2020-09-25 NOTE — Therapy (Signed)
[facility] [place], [place], [address] Phone: [phone]   Fax:  [phone]  Patient Details  Name: EVALYNN HANKINS MRN: [id number] Date of Birth: Aug 16, 1991 Referring Provider:  No ref. provider found  Encounter Date: 09/25/2020  PHYSICAL THERAPY DISCHARGE SUMMARY  Visits from Start of Care: 3  Current functional level related to goals / functional outcomes: PT states that she is 80% better   Remaining deficits: Pain    Education / Equipment: HEP Plan: Patient agrees to discharge.  Patient goals were partially met. Patient is being discharged due to not returning since the last visit.  ?????    Rayetta Humphrey, PT CLT [phone] 09/25/2020, 4:40 PM  [facility] [address] [place], [place], [address] Phone: [phone]   Fax:  [phone]

## 2020-09-26 DIAGNOSIS — E039 Hypothyroidism, unspecified: Secondary | ICD-10-CM | POA: Diagnosis not present

## 2020-09-26 LAB — TSH: TSH: 61.6 — AB (ref 0.41–5.90)

## 2020-10-10 ENCOUNTER — Ambulatory Visit (INDEPENDENT_AMBULATORY_CARE_PROVIDER_SITE_OTHER): Payer: Medicaid Other | Admitting: "Endocrinology

## 2020-10-10 ENCOUNTER — Other Ambulatory Visit: Payer: Self-pay

## 2020-10-10 ENCOUNTER — Encounter: Payer: Self-pay | Admitting: "Endocrinology

## 2020-10-10 VITALS — BP 106/64 | HR 72 | Ht 60.0 in | Wt 149.2 lb

## 2020-10-10 DIAGNOSIS — E890 Postprocedural hypothyroidism: Secondary | ICD-10-CM

## 2020-10-10 MED ORDER — LEVOTHYROXINE SODIUM 112 MCG PO TABS: 112.0000 ug | ORAL_TABLET | Freq: Every day | ORAL | 1 refills | Status: DC

## 2020-10-10 NOTE — Progress Notes (Signed)
10/10/2020         Endocrinology follow-up note  Subjective:    Patient ID: Peggy [name], female    DOB: 08-18-1991.   Past Medical History:  Diagnosis Date  . Abnormal Pap smear of cervix 06/22/2020   05/2020 pap LSIL negative HPV and GC/CHL  As per ASCCP guidelines repeat in 1 year, 5 year risk of CIN 3 + is 2%  . Asthma   . Gestational diabetes   . Pyloric stenosis   . Thyroid disease    Graves    Past Surgical History:  Procedure Laterality Date  . PYLOROMYOTOMY      Social History   Socioeconomic History  . Marital status: Divorced    Spouse name: Peggy [name]  . Number of children: 2  . Years of education: Not on file  . Highest education level: Some college, no degree  Occupational History  . Not on file  Tobacco Use  . Smoking status: Current Every Day Smoker    Packs/day: 0.25    Years: 5.00    Pack years: 1.25    Types: Cigarettes  . Smokeless tobacco: Never Used  . Tobacco comment: 2 cig/day  Vaping Use  . Vaping Use: Never used  Substance and Sexual Activity  . Alcohol use: Yes    Comment: rarely  . Drug use: No  . Sexual activity: Yes    Birth control/protection: I.U.D.  Other Topics Concern  . Not on file  Social History Narrative  . Not on file   Social Determinants of Health   Financial Resource Strain: Low Risk   . Difficulty of Paying Living Expenses: Not hard at all  Food Insecurity: No Food Insecurity  . Worried About Charity fundraiser in the Last Year: Never true  . Ran Out of Food in the Last Year: Never true  Transportation Needs: No Transportation Needs  . Lack of Transportation (Medical): No  . Lack of Transportation (Non-Medical): No  Physical Activity: Sufficiently Active  . Days of Exercise per Week: 5 days  . Minutes of Exercise per Session: 90 min  Stress: Stress Concern Present  . Feeling of Stress : To some extent  Social Connections: Socially Isolated  . Frequency of Communication with Friends and  Family: More than three times a week  . Frequency of Social Gatherings with Friends and Family: Once a week  . Attends Religious Services: Never  . Active Member of Clubs or Organizations: No  . Attends Archivist Meetings: Never  . Marital Status: Divorced    Family History  Problem Relation Age of Onset  . Arthritis Mother   . Cancer Mother        thyroid  . Depression Mother   . Hyperlipidemia Father   . Asthma Father   . Hypertension Father   . Heart disease Father   . COPD Maternal Grandfather   . Diabetes Paternal Grandmother   . Arthritis Paternal Grandmother   . Asthma Paternal Grandmother   . Heart disease Paternal Grandmother   . Depression Paternal Grandmother   . Hypertension Paternal Grandmother   . Hyperlipidemia Paternal Grandmother   . Other Neg Hx     Outpatient Encounter Medications as of 10/10/2020  Medication Sig  . albuterol (PROVENTIL) (2.5 MG/3ML) 0.083% nebulizer solution Take 3 mLs (2.5 mg total) by nebulization every 6 (six) hours as needed for wheezing or shortness of breath. (Patient not taking: Reported on 09/15/2020)  . etonogestrel-ethinyl estradiol (NUVARING) 0.12-0.015  MG/24HR vaginal ring Insert vaginally and leave in place for 3 consecutive weeks, then remove for 1 week.  . levothyroxine (SYNTHROID) 112 MCG tablet Take 1 tablet (112 mcg total) by mouth daily before breakfast.  . PROAIR HFA 108 (90 Base) MCG/ACT inhaler TAKE 2 PUFFS BY MOUTH EVERY 6 HOURS AS NEEDED FOR WHEEZE OR SHORTNESS OF BREATH  . [DISCONTINUED] benzonatate (TESSALON) 200 MG capsule Take 1 capsule (200 mg total) by mouth 3 (three) times daily as needed for cough. Swallow whole, do not chew (Patient not taking: Reported on 09/15/2020)  . [DISCONTINUED] levothyroxine (SYNTHROID) 125 MCG tablet Take 125 mcg by mouth every morning.  . [DISCONTINUED] levothyroxine (SYNTHROID) 150 MCG tablet Take 150 mcg by mouth daily.  . [DISCONTINUED] levothyroxine (SYNTHROID) 88 MCG  tablet Take 1 tablet (88 mcg total) by mouth daily before breakfast.   No facility-administered encounter medications on file as of 10/10/2020.    ALLERGIES: No Known Allergies  VACCINATION STATUS: Immunization History  Administered Date(s) Administered  . Influenza,inj,Quad PF,6+ Mos 12/11/2014, 09/23/2018  . MMR 12/12/2014  . Pneumococcal Polysaccharide-23 07/12/2012, 12/12/2014, 08/17/2017  . Tdap 12/11/2014, 06/02/2017, 02/18/2019     HPI  Peggy [name] is 29 y.o. female who is being engaged in telehealth via telephone after she was given radioactive iodine thyroid ablation for Graves' disease.   She is s/p I-131 thyroid ablation treatment for hyperthyroidism from Graves' disease on February 01, 2020.  She was given levothyroxine 88 mcg during her last visit.  In the interval, her levothyroxine dose was increased to 150 mcg.  She presents with reversal of her previous symptoms of thyrotoxicosis.  She deals with palpitations, anxiety, tremors, heat intolerance.    She admits to inconsistency taking her thyroid hormone. she denies dysphagia, choking, shortness of breath, no recent voice change.  she reports thyroid cancer in her mother who had to undergo thyroidectomy and what appears to be radioactive iodine thyroid remnant ablation.   she denies personal history of goiter.                           Review of systems See the note from last visit.   Objective:    BP 106/64   Pulse 72   Ht 5' (1.524 m)   Wt 149 lb 3.2 oz (67.7 kg)   BMI 29.14 kg/m   Wt Readings from Last 3 Encounters:  10/10/20 149 lb 3.2 oz (67.7 kg)  09/15/20 146 lb 6.4 oz (66.4 kg)  08/27/20 140 lb (63.5 kg)                                                Physical exam  -See the note from last visit.   CMP     Component Value Date/Time   NA 139 08/27/2020 1437   K 3.9 08/27/2020 1437   CL 106 08/27/2020 1437   CO2 23 08/27/2020 1437   GLUCOSE 86 08/27/2020 1437   BUN 15 08/27/2020  1437   CREATININE 0.86 08/27/2020 1437   CALCIUM 8.9 08/27/2020 1437   PROT 8.6 (H) 04/10/2020 1724   ALBUMIN 5.0 04/10/2020 1724   AST 52 (H) 04/10/2020 1724   ALT 37 04/10/2020 1724   ALKPHOS 72 04/10/2020 1724   BILITOT 0.5 04/10/2020 1724   GFRNONAA >60 08/27/2020 1437  GFRAA >60 08/27/2020 1437    CBC    Component Value Date/Time   WBC 4.9 08/27/2020 1437   RBC 4.24 08/27/2020 1437   HGB 13.4 08/27/2020 1437   HGB 11.8 01/28/2019 0844   HCT 40.9 08/27/2020 1437   HCT 34.8 01/28/2019 0844   PLT 249 08/27/2020 1437   PLT 328 01/28/2019 0844   MCV 96.5 08/27/2020 1437   MCV 89 01/28/2019 0844   MCH 31.6 08/27/2020 1437   MCHC 32.8 08/27/2020 1437   RDW 13.3 08/27/2020 1437   RDW 12.7 01/28/2019 0844   LYMPHSABS 2.0 08/27/2020 1437   LYMPHSABS 2.7 09/25/2018 0847   MONOABS 0.3 08/27/2020 1437   EOSABS 0.2 08/27/2020 1437   EOSABS 0.3 09/25/2018 0847   BASOSABS 0.1 08/27/2020 1437   BASOSABS 0.0 09/25/2018 0847     Diabetic Labs (most recent): Lab Results  Component Value Date   HGBA1C 5.0 09/25/2018    Lab Results  Component Value Date   TSH 61.60 (A) 09/26/2020   TSH 90.500 (H) 06/07/2020   TSH 85.200 (H) 03/27/2020   TSH <0.005 (L) 01/06/2020   TSH <0.005 (L) 11/30/2019   TSH 0.01 (A) 09/27/2019   TSH 0.001 (L) 08/17/2019   FREET4 0.38 (L) 06/07/2020   FREET4 <0.10 (L) 03/27/2020   FREET4 1.64 01/06/2020   FREET4 1.76 11/30/2019        Assessment & Plan:   -RAI induced hypothyroidism   -  She is status post I-131 thyroid ablation on February 01, 2020 for severe 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 She has responded with subsequent RAI induced hypothyroidism. -Her previsit thyroid function tests are consistent with under replacement.  However, she admits to inconsistency taking her medication.  This led to over prescription of levothyroxine.  More recently she is dealing with thyrotoxicosis which is of iatrogenic origin.  I discussed and lowered  her levothyroxine to 112 mcg p.o. daily before breakfast.   - We discussed about the correct intake of her thyroid hormone, on empty stomach at fasting, with water, separated by at least 30 minutes from breakfast and other medications,  and separated by more than 4 hours from calcium, iron, multivitamins, acid reflux medications (PPIs). -Patient is made aware of the fact that thyroid hormone replacement is needed for life, dose to be adjusted by periodic monitoring of thyroid function tests.   -  Given her family history of thyroid malignancy, she will be considered for thyroid ultrasound in 1 year.       - Time spent on this patient care encounter:  20 minutes of which 50% was spent in  counseling and the rest reviewing  her current and  previous labs / studies and medications  doses and developing a plan for long term care. Peggy [name]  participated in the discussions, expressed understanding, and voiced agreement with the above plans.  All questions were answered to her satisfaction. she is encouraged to contact clinic should she have any questions or concerns prior to her return visit.   Follow up plan: Return in about 3 months (around 01/10/2021) for F/U with Pre-visit Labs.   Thank you for involving me in the care of this pleasant patient, and I will continue to update you with her progress.  Glade Lloyd, MD [facility] [place] [facility] Phone: [phone]  Fax: [phone]   10/10/2020, 11:32 PM  This note was partially dictated with voice recognition software. Similar sounding words can be transcribed inadequately or may not  be corrected upon review.

## 2020-10-11 ENCOUNTER — Ambulatory Visit (INDEPENDENT_AMBULATORY_CARE_PROVIDER_SITE_OTHER): Payer: Medicaid Other | Admitting: Allergy & Immunology

## 2020-10-11 ENCOUNTER — Encounter: Payer: Self-pay | Admitting: Allergy & Immunology

## 2020-10-11 VITALS — BP 126/70 | HR 89 | Temp 99.1°F | Resp 17 | Ht 60.0 in | Wt 149.0 lb

## 2020-10-11 DIAGNOSIS — L508 Other urticaria: Secondary | ICD-10-CM | POA: Diagnosis not present

## 2020-10-11 DIAGNOSIS — J302 Other seasonal allergic rhinitis: Secondary | ICD-10-CM | POA: Diagnosis not present

## 2020-10-11 DIAGNOSIS — J3089 Other allergic rhinitis: Secondary | ICD-10-CM

## 2020-10-11 DIAGNOSIS — B999 Unspecified infectious disease: Secondary | ICD-10-CM | POA: Diagnosis not present

## 2020-10-11 DIAGNOSIS — J4530 Mild persistent asthma, uncomplicated: Secondary | ICD-10-CM

## 2020-10-11 MED ORDER — FAMOTIDINE 20 MG PO TABS: 20.0000 mg | ORAL_TABLET | Freq: Two times a day (BID) | ORAL | 5 refills | Status: DC

## 2020-10-11 MED ORDER — FLOVENT HFA 110 MCG/ACT IN AERO: 2.0000 | INHALATION_SPRAY | Freq: Two times a day (BID) | RESPIRATORY_TRACT | 5 refills | Status: DC

## 2020-10-11 MED ORDER — CETIRIZINE HCL 10 MG PO TABS: 20.0000 mg | ORAL_TABLET | Freq: Two times a day (BID) | ORAL | 5 refills | Status: DC

## 2020-10-11 NOTE — Progress Notes (Signed)
NEW PATIENT  Date of Service/Encounter:  10/11/20  Referring provider: [facility]   Assessment:   Chronic idiopathic urticaria  Seasonal and perennial allergic rhinitis  Recurrent infections  Mild persistent asthma, uncomplicated  Plan/Recommendations:   1. Chronic urticaria - Your history does not have any "red flags" such as fevers, joint pains, or permanent skin changes that would be concerning for a more serious cause of hives.  - We will get some labs to rule out serious causes of hives: complete blood count, tryptase level, alpha gal panel, chronic urticaria panel, CMP, ESR, and CRP. - Chronic hives are often times a self limited process and will "burn themselves out" over 6-12 months, although this is not always the case.  - In the meantime, start suppressive dosing of antihistamines:   - Morning: Zyrtec (cetirizine) 77m (two tablets) + Pepcid (famotidine) 24m - Evening: Zyrtec (cetirizine) 2028mtwo tablets) + Pepcid (famotidine) 28m37mYou can change this dosing at home, decreasing the dose as needed or increasing the dosing as needed.  - If you are not tolerating the medications or are tired of taking them every day, we can start treatment with a monthly injectable medication called Xolair.   2. Seasonal and perennial allergic rhinitis - Testing today showed: grasses, trees, indoor molds, dust mites, cat, dog and cockroach - Copy of test results provided.  - Avoidance measures provided. - Start taking: antihistamines as above - You can use an extra dose of the antihistamine, if needed, for breakthrough symptoms.  - Consider nasal saline rinses 1-2 times daily to remove allergens from the nasal cavities as well as help with mucous clearance (this is especially helpful to do before the nasal sprays are given) - Consider allergy shots as a means of long-term control. - Allergy shots "re-train" and "reset" the immune system to ignore environmental  allergens and decrease the resulting immune response to those allergens (sneezing, itchy watery eyes, runny nose, nasal congestion, etc).    - Allergy shots improve symptoms in 75-85% of patients.  - We can discuss more at the next appointment if the medications are not working for you.  3. Recurrent infections - We will obtain some screening labs to evaluate your immune system.  - Labs to evaluate the quantitative ([facility]s of your immune system: IgG/IgA/IgM, CBC with differential - Labs to evaluate the qualitative (HOW [place] of your immune system: CH50, Pneumococcal titers, Tetanus titers, Diphtheria titers - We may consider immunizations with Pneumovax and Tdap to challenge your immune system, and then obtain repeat titers in 4-6 weeks.   4. Mild persistent asthma, uncomplicated - Lung testing looked fairly good today. - Since you are using your albuterol so frequently, I recommend starting a controller medication (Flovent). - Spacer use reviewed. - Daily controller medication(s): Flovent 110mc22mpuff twice daily with spacer - Prior to physical activity: albuterol 2 puffs 10-15 minutes before physical activity. - Rescue medications: albuterol 4 puffs every 4-6 hours as needed - Changes during respiratory infections or worsening symptoms: Increase Flovent to 2 puffs twice daily for TWO WEEKS. - Asthma control goals:  * Full participation in all desired activities (may need albuterol before activity) * Albuterol use two time or less a week on average (not counting use with activity) * Cough interfering with sleep two time or less a month * Oral steroids no more than once a year * No hospitalizations  5. Return in about 6 weeks (around  11/22/2020). This can be an in-person, a virtual Webex or a telephone follow up visit.   Subjective:   Peggy [name] is a 29 y.o. female presenting today for evaluation of  Chief Complaint  Patient presents with  .  Urticaria    Peggy [name] has a history of the following: Patient Active Problem List   Diagnosis Date Noted  . Abnormal Pap smear of cervix 06/22/2020  . 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/09/2020  . Graves' disease 01/24/2020  . Depression screening 05/25/2019  . History of gestational diabetes 10/28/2018  . Low vitamin D level 02/12/2017  . Smoker 07/25/2014    History obtained from: chart review and patient.  Peggy [name] was referred by [facility].     Peggy [name] is a 29 y.o. female presenting for an evaluation of urticaria.  She was diagnosed with Graves disease. This was August 2020. Around the beginning of February, she underwent radiation treatments for her Grave's disease. She has had hives every single day since that time. She reports that the hives started around that time. She has had these outbreaks every day since February. It is not associated with foods or any changes at all. She denies new pet exposures. She denies tick bites.   She has been treating this with Benadryl. She has breakouts at night as well. It only happens when she is sleeping. Throughout the day, she is totally fine. She has no throat swelling with this for the most part. She does not have an EpiPen. She has never been to the hospital for these symptoms.    Asthma/Respiratory Symptom History: She does have a history of asthma. She has been using her inhaler every couple of months, but now she is using it throughout the day. She was on a daily controller medication for her asthma when she was younger. She has not needed prednisone for her asthma in quite some time. She did have COVID in September and she got prednisone at that time for her asthma symptoms. She reports that she gets PNA 1-2 times per year. These are CXR diagnosed and she received antibiotics as well as prednisone.   Allergic Rhinitis Symptom History: She does have allergic rhinitis to dogs. There does  not seme to be anything else that triggers her asthma. Sometimes she can have problems with very strong smells.   Food Allergy Symptom History: She tolerates all of the major food allergens without adverse event.   Otherwise, there is no history of other atopic diseases, including drug allergies, stinging insect allergies, eczema or contact dermatitis. There is no significant infectious history. Vaccinations are up to date.    Past Medical History: Patient Active Problem List   Diagnosis Date Noted  . Abnormal Pap smear of cervix 06/22/2020  . 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/09/2020  . Graves' disease 01/24/2020  . Depression screening 05/25/2019  . History of gestational diabetes 10/28/2018  . Low vitamin D level 02/12/2017  . Smoker 07/25/2014    Medication List:  Allergies as of 10/11/2020   No Known Allergies     Medication List       Accurate as of October 11, 2020  4:39 PM. If you have any questions, ask your nurse or doctor.        albuterol (2.5 MG/3ML) 0.083% nebulizer solution Commonly known as: PROVENTIL Take 3 mLs (2.5 mg total) by nebulization every 6 (six) hours as needed for wheezing or shortness of breath.   ProAir HFA 108 (  90 Base) MCG/ACT inhaler Generic drug: albuterol TAKE 2 PUFFS BY MOUTH EVERY 6 HOURS AS NEEDED FOR WHEEZE OR SHORTNESS OF BREATH   cetirizine 10 MG tablet Commonly known as: ZYRTEC Take 2 tablets (20 mg total) by mouth 2 (two) times daily. Started by: Valentina Shaggy, MD   diphenhydrAMINE 25 MG tablet Commonly known as: BENADRYL Take 25 mg by mouth every 6 (six) hours as needed.   etonogestrel-ethinyl estradiol 0.12-0.015 MG/24HR vaginal ring Commonly known as: NUVARING Insert vaginally and leave in place for 3 consecutive weeks, then remove for 1 week.   famotidine 20 MG tablet Commonly known as: Pepcid Take 1 tablet (20 mg total) by mouth 2 (two) times daily. Started by: Valentina Shaggy, MD    Flovent HFA 110 MCG/ACT inhaler Generic drug: fluticasone Inhale 2 puffs into the lungs 2 (two) times daily. Started by: Valentina Shaggy, MD   levothyroxine 112 MCG tablet Commonly known as: SYNTHROID Take 1 tablet (112 mcg total) by mouth daily before breakfast.       Birth History: non-contributory  Developmental History: non-contributory  Past Surgical History: Past Surgical History:  Procedure Laterality Date  . PYLOROMYOTOMY       Family History: Family History  Problem Relation Age of Onset  . Arthritis Mother   . Cancer Mother        thyroid  . Depression Mother   . Hyperlipidemia Father   . Asthma Father   . Hypertension Father   . Heart disease Father   . COPD Maternal Grandfather   . Diabetes Paternal Grandmother   . Arthritis Paternal Grandmother   . Asthma Paternal Grandmother   . Heart disease Paternal Grandmother   . Depression Paternal Grandmother   . Hypertension Paternal Grandmother   . Hyperlipidemia Paternal Grandmother   . Other Neg Hx      Social History: [place] lives at home with her family.  They live in an apartment.  There is moderate damage in the apartment.  They have electric heating and central cooling.  There are no animals inside or outside of the home.  She does not have dust mite covers on the bedding.  There is tobacco exposure in the house as well as car.  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  There are no fume, chemical, or dust exposures.  She does not live near an interstate or industrial area.  She smokes a fourth of a pack per day.   Review of Systems  Constitutional: Negative.  Negative for chills, fever, malaise/fatigue and weight loss.  HENT: Negative for congestion, ear discharge, ear pain and sinus pain.   Eyes: Negative for pain, discharge and redness.  Respiratory: Negative for cough, sputum production, shortness of breath and wheezing.   Cardiovascular: Negative.  Negative for chest  pain and palpitations.  Gastrointestinal: Negative for abdominal pain, constipation, diarrhea, heartburn, nausea and vomiting.  Skin: Positive for itching and rash.  Neurological: Negative for dizziness and headaches.  Endo/Heme/Allergies: Negative for environmental allergies. Does not bruise/bleed easily.       Objective:   Blood pressure 126/70, pulse 89, temperature 99.1 F (37.3 C), temperature source Temporal, resp. rate 17, height 5' (1.524 m), weight 149 lb (67.6 kg), SpO2 100 %, not currently breastfeeding. Body mass index is 29.1 kg/m.   Physical Exam:   Physical Exam Constitutional:      Appearance: She is well-developed.     Comments: Talkative. Seems somewhat anxious.  HENT:     Head: Normocephalic and atraumatic.     Right Ear: Tympanic membrane, ear canal and external ear normal. No drainage, swelling or tenderness. Tympanic membrane is not injected, scarred, erythematous, retracted or bulging.     Left Ear: Tympanic membrane, ear canal and external ear normal. No drainage, swelling or tenderness. Tympanic membrane is not injected, scarred, erythematous, retracted or bulging.     Nose: No nasal deformity, septal deviation, mucosal edema or rhinorrhea.     Right Turbinates: Enlarged and swollen.     Left Turbinates: Enlarged and swollen.     Right Sinus: No maxillary sinus tenderness or frontal sinus tenderness.     Left Sinus: No maxillary sinus tenderness or frontal sinus tenderness.     Mouth/Throat:     Mouth: Mucous membranes are not pale and not dry.     Pharynx: Uvula midline.  Eyes:     General:        Right eye: No discharge.        Left eye: No discharge.     Conjunctiva/sclera: Conjunctivae normal.     Right eye: Right conjunctiva is not injected. No chemosis.    Left eye: Left conjunctiva is not injected. No chemosis.    Pupils: Pupils are equal, round, and reactive to light.  Cardiovascular:     Rate and Rhythm: Normal rate and regular rhythm.      Heart sounds: Normal heart sounds.  Pulmonary:     Effort: Pulmonary effort is normal. No tachypnea, accessory muscle usage or respiratory distress.     Breath sounds: Normal breath sounds. No wheezing, rhonchi or rales.  Chest:     Chest wall: No tenderness.  Abdominal:     Tenderness: There is no abdominal tenderness. There is no guarding or rebound.  Lymphadenopathy:     Head:     Right side of head: No submandibular, tonsillar or occipital adenopathy.     Left side of head: No submandibular, tonsillar or occipital adenopathy.     Cervical: No cervical adenopathy.  Skin:    Coloration: Skin is not pale.     Findings: No abrasion, erythema, petechiae or rash. Rash is not papular, urticarial or vesicular.     Comments: There are some urticarial lesions noted on the bilateral arms with excoriations.   Neurological:     Mental Status: She is alert.  Psychiatric:        Behavior: Behavior is cooperative.      Diagnostic studies:    Spirometry: results normal (FEV1: 2.58/91%, FVC: 3.47/105%, FEV1/FVC: 74%).    Spirometry consistent with normal pattern.   Allergy Studies:     Airborne Adult Perc - 10/11/20 1507    Time Antigen Placed 1430    Allergen Manufacturer Lavella Hammock    Location Back    Number of Test 59    Panel 1 Select    1. Control-Buffer 50% Glycerol Negative    2. Control-Histamine 1 mg/ml 3+    3. Albumin saline Negative    4. [place] Negative    5. [place] Negative    6. Johnson Negative    7. [place] Blue Negative    8. Meadow Fescue Negative    9. Perennial Rye Negative    10. Sweet Vernal Negative    11. Timothy Negative    12. Cocklebur Negative    13. Burweed Marshelder Negative    14. Ragweed, short Negative    15. Ragweed, Giant Negative  16. Plantain,  English Negative    17. Lamb's Quarters Negative    18. Sheep Sorrell Negative    19. Rough Pigweed Negative    20. Marsh Elder, Rough Negative    21. Mugwort, Common Negative    22. Ash  mix Negative    23. Birch mix Negative    24. Beech American Negative    25. Box, Elder Negative    26. Cedar, red Negative    27. Cottonwood, [place] Negative    28. Elm mix Negative    29. Hickory Negative    30. Maple mix Negative    31. Oak, [place] mix Negative    32. Pecan Pollen 3+    33. Pine mix Negative    34. Sycamore Eastern Negative    35. [place], Black Pollen Negative    36. Alternaria alternata Negative    37. Cladosporium Herbarum Negative    38. Aspergillus mix Negative    39. Penicillium mix Negative    40. Bipolaris sorokiniana (Helminthosporium) Negative    41. Drechslera spicifera (Curvularia) Negative    42. Mucor plumbeus Negative    43. Fusarium moniliforme Negative    44. Aureobasidium pullulans (pullulara) Negative    45. Rhizopus oryzae Negative    46. Botrytis cinera Negative    47. Epicoccum nigrum Negative    48. Phoma betae Negative    49. Candida Albicans Negative    50. Trichophyton mentagrophytes Negative    51. Mite, D Farinae  5,000 AU/ml Negative    52. Mite, D Pteronyssinus  5,000 AU/ml 3+    53. Cat Hair 10,000 BAU/ml 2+    54.  Dog Epithelia Negative    55. Mixed Feathers Negative    56. Horse Epithelia Negative    57. Cockroach, German Negative    58. Mouse Negative    59. Tobacco Leaf Negative          Food Perc - 10/11/20 1508      Test Information   Time Antigen Placed 1430    Allergen Manufacturer Lavella Hammock    Location Back    Number of allergen test [place]   1. Peanut Negative    2. Soybean food Negative    3. Wheat, whole Negative    4. Sesame Negative    5. Milk, cow Negative    6. Egg White, chicken Negative    7. Casein Negative    8. Shellfish mix Negative    9. Fish mix Negative    10. Cashew Negative          Intradermal - 10/11/20 1508    Time Antigen Placed 1455    Allergen Manufacturer Lavella Hammock    Location Arm    Number of Test 13    Intradermal Select    Control Negative     [place] 1+    Johnson Negative    7 Grass Negative    Ragweed mix Negative    Weed mix Negative    Tree mix Negative    Mold 1 Negative    Mold 2 2+    Mold 3 Negative    Mold 4 3+    Dog 2+    Cockroach 2+           Allergy testing results were read and interpreted by myself, documented by clinical staff.         Salvatore Marvel, MD Allergy and [place]  [facility] [place]

## 2020-10-11 NOTE — Patient Instructions (Addendum)
1. Chronic urticaria - Your history does not have any "red flags" such as fevers, joint pains, or permanent skin changes that would be concerning for a more serious cause of hives.  - We will get some labs to rule out serious causes of hives: complete blood count, tryptase level, alpha gal panel, chronic urticaria panel, CMP, ESR, and CRP. - Chronic hives are often times a self limited process and will "burn themselves out" over 6-12 months, although this is not always the case.  - In the meantime, start suppressive dosing of antihistamines:   - Morning: Zyrtec (cetirizine) 72m (two tablets) + Pepcid (famotidine) 283m - Evening: Zyrtec (cetirizine) 2047mtwo tablets) + Pepcid (famotidine) 21m76mYou can change this dosing at home, decreasing the dose as needed or increasing the dosing as needed.  - If you are not tolerating the medications or are tired of taking them every day, we can start treatment with a monthly injectable medication called Xolair.   2. Seasonal and perennial allergic rhinitis - Testing today showed: grasses, trees, indoor molds, dust mites, cat, dog and cockroach - Copy of test results provided.  - Avoidance measures provided. - Start taking: antihistamines as above - You can use an extra dose of the antihistamine, if needed, for breakthrough symptoms.  - Consider nasal saline rinses 1-2 times daily to remove allergens from the nasal cavities as well as help with mucous clearance (this is especially helpful to do before the nasal sprays are given) - Consider allergy shots as a means of long-term control. - Allergy shots "re-train" and "reset" the immune system to ignore environmental allergens and decrease the resulting immune response to those allergens (sneezing, itchy watery eyes, runny nose, nasal congestion, etc).    - Allergy shots improve symptoms in 75-85% of patients.  - We can discuss more at the next appointment if the medications are not working for you.  3.  Recurrent infections - We will obtain some screening labs to evaluate your immune system.  - Labs to evaluate the quantitative ([facility]s of your immune system: IgG/IgA/IgM, CBC with differential - Labs to evaluate the qualitative (HOW [place]s of your immune system: CH50, Pneumococcal titers, Tetanus titers, Diphtheria titers - We may consider immunizations with Pneumovax and Tdap to challenge your immune system, and then obtain repeat titers in 4-6 weeks.   4. Mild persistent asthma, uncomplicated - Lung testing looked fairly good today. - Since you are using your albuterol so frequently, I recommend starting a controller medication (Flovent). - Spacer use reviewed. - Daily controller medication(s): Flovent 110mc18mpuff twice daily with spacer - Prior to physical activity: albuterol 2 puffs 10-15 minutes before physical activity. - Rescue medications: albuterol 4 puffs every 4-6 hours as needed - Changes during respiratory infections or worsening symptoms: Increase Flovent to 2 puffs twice daily for TWO WEEKS. - Asthma control goals:  * Full participation in all desired activities (may need albuterol before activity) * Albuterol use two time or less a week on average (not counting use with activity) * Cough interfering with sleep two time or less a month * Oral steroids no more than once a year * No hospitalizations  5. Return in about 6 weeks (around 11/22/2020). This can be an in-person, a virtual Webex or a telephone follow up visit.   Please inform us [facility] visits, hospitalizations, or changes in symptoms. Call us [facility] going to the ED for breathing or allergy  symptoms since we might be able to fit you in for a sick visit. Feel free to contact us anytime with any questions, problems, or concerns.  It was a pleasure to meet you today!  Websites that have reliable patient information: 1. American Academy of Asthma, Allergy, and  Immunology: www.aaaai.org 2. Food Allergy Research and Education (FARE): foodallergy.org 3. Mothers of Asthmatics: http://www.asthmacommunitynetwork.org 4. American College of Allergy, Asthma, and Immunology: www.acaai.org   COVID-19 Vaccine Information can be found at: ShippingScam.co.uk For questions related to vaccine distribution or appointments, please email [email or web address] or call [phone].     "Like" [name] on Facebook and Instagram for our latest updates!     HAPPY FALL!     Make sure you are registered to vote! If you have moved or changed any of your contact information, you will need to get this updated before voting!  In some cases, you MAY be able to register to vote online: [email or web address]

## 2020-10-12 ENCOUNTER — Encounter: Payer: Self-pay | Admitting: Allergy & Immunology

## 2020-10-12 DIAGNOSIS — J3089 Other allergic rhinitis: Secondary | ICD-10-CM | POA: Insufficient documentation

## 2020-10-12 DIAGNOSIS — B999 Unspecified infectious disease: Secondary | ICD-10-CM | POA: Insufficient documentation

## 2020-10-12 DIAGNOSIS — L508 Other urticaria: Secondary | ICD-10-CM | POA: Insufficient documentation

## 2020-10-12 DIAGNOSIS — J302 Other seasonal allergic rhinitis: Secondary | ICD-10-CM | POA: Insufficient documentation

## 2020-10-12 DIAGNOSIS — J4530 Mild persistent asthma, uncomplicated: Secondary | ICD-10-CM | POA: Insufficient documentation

## 2020-11-22 ENCOUNTER — Ambulatory Visit: Payer: Medicaid Other | Admitting: Family

## 2020-12-12 ENCOUNTER — Ambulatory Visit: Payer: Medicaid Other | Admitting: Women's Health

## 2020-12-18 ENCOUNTER — Other Ambulatory Visit: Payer: Self-pay

## 2020-12-18 ENCOUNTER — Other Ambulatory Visit: Payer: Self-pay | Admitting: Allergy & Immunology

## 2020-12-18 NOTE — Telephone Encounter (Signed)
Called and spoke to patient to inform her that a refill will be sent but she needs to have a follow up visit per Dr. Gillermina Hu note. Patient agreed to do a telephone visit and we scheduled her appointment.

## 2020-12-31 NOTE — Patient Instructions (Incomplete)
Chronic urticaria Continue Zyrtec 10 mg-take 2 tablets twice a day to help with itching Continue Pepcid 20 mg-1 tablet twice a day to help with itching Get labs drawn from last office visit Consider Xolair in the future  Seasonal and perennial allergic rhinitis ( grass, trees, molds, dust mite, cat, dog, cockroach) Continue antihistamines as above May use saline nasal rinse as needed for nasal symptoms. Use this prior to any medicated nasal sprays  Recurrent infections Get labs drawn from last office visit  Mild persistent asthma Continue Flovent 110 mcg 1 puff twice a day with spacer to help prevent cough and wheeze May use albuterol 2 puffs every 4 hours as needed for cough, wheeze, tightness in chest, or shortness of breath. May also use albuterol 2 puffs 5-15 minutes prior to exercise  Please let us know if this treatment plan is not working well for you.  Schedule a follow up appointment in

## 2021-01-01 ENCOUNTER — Ambulatory Visit: Payer: Medicaid Other | Admitting: Family

## 2021-01-01 ENCOUNTER — Other Ambulatory Visit: Payer: Self-pay

## 2021-01-05 DIAGNOSIS — K7460 Unspecified cirrhosis of liver: Secondary | ICD-10-CM | POA: Diagnosis not present

## 2021-01-05 DIAGNOSIS — M2550 Pain in unspecified joint: Secondary | ICD-10-CM | POA: Diagnosis not present

## 2021-01-05 DIAGNOSIS — R1010 Upper abdominal pain, unspecified: Secondary | ICD-10-CM | POA: Diagnosis not present

## 2021-01-11 ENCOUNTER — Ambulatory Visit: Payer: Medicaid Other | Admitting: "Endocrinology

## 2021-02-04 ENCOUNTER — Other Ambulatory Visit: Payer: Self-pay | Admitting: Allergy & Immunology

## 2021-02-07 ENCOUNTER — Ambulatory Visit (INDEPENDENT_AMBULATORY_CARE_PROVIDER_SITE_OTHER): Payer: Medicaid Other | Admitting: Internal Medicine

## 2021-02-07 ENCOUNTER — Other Ambulatory Visit: Payer: Self-pay

## 2021-02-07 ENCOUNTER — Encounter: Payer: Self-pay | Admitting: Internal Medicine

## 2021-02-07 VITALS — BP 128/84 | HR 88 | Ht 60.0 in | Wt 157.4 lb

## 2021-02-07 DIAGNOSIS — E890 Postprocedural hypothyroidism: Secondary | ICD-10-CM

## 2021-02-07 DIAGNOSIS — E559 Vitamin D deficiency, unspecified: Secondary | ICD-10-CM

## 2021-02-07 DIAGNOSIS — E0500 Thyrotoxicosis with diffuse goiter without thyrotoxic crisis or storm: Secondary | ICD-10-CM | POA: Diagnosis not present

## 2021-02-07 LAB — VITAMIN D 25 HYDROXY (VIT D DEFICIENCY, FRACTURES): VITD: 22.56 ng/mL — ABNORMAL LOW (ref 30.00–100.00)

## 2021-02-07 NOTE — Progress Notes (Signed)
Name: Peggy [name]  MRN/ DOB: [id number], 25-Aug-1991    Age/ Sex: 30 y.o., female    PCP: [facility], [facility]   Reason for Endocrinology Evaluation: Postablative Hypothyroidism     Date of Initial Endocrinology Evaluation: 02/07/2021     HPI: Peggy [name] is a 30 y.o. female with a past medical history of postablative hypothyroidism, Asthma and utricaria . The patient presented for initial endocrinology clinic visit on 02/07/2021 for consultative assistance with her Postablative Hypothyroidism .   She presented to Dr. Liliane Channel office in 08/2020 with hyperthyroidism secondary to Graves' disease.  Her thyroid uptake and scan on 01/20/2020 showed homogenous uptake through the thyroid with a 4-hr uptake at 27.5% ( 5-20%) and a 31 hr uptake of 46.5%   She is S/P RAI ablation on 02/01/2020 with 20 mCi of I-131  By 02/2020 she became hypothyroid with a TSH of 85.200 uIU/mL and was started on LT-4 replacement at the time .   Weight has been increasing with fatigue  Denies constipation  Denies depression  Denies local neck symptoms    LMP last week , has Nuvaring   Has been compliant with LT- 4 replacement   Is seeing an allergist for allergies    Mother and maternal uncle with thyroid disease      HISTORY:  Past Medical History:  Past Medical History:  Diagnosis Date  . Abnormal Pap smear of cervix 06/22/2020   05/2020 pap LSIL negative HPV and GC/CHL  As per ASCCP guidelines repeat in 1 year, 5 year risk of CIN 3 + is 2%  . Asthma   . Gestational diabetes   . Pyloric stenosis   . Thyroid disease    Graves  . Urticaria    Past Surgical History:  Past Surgical History:  Procedure Laterality Date  . PYLOROMYOTOMY        Social History:  reports that she has been smoking cigarettes. She has a 1.25 pack-year smoking history. She has never used smokeless tobacco. She reports current alcohol use. She reports that she does not use drugs.  Family History:  family history includes Arthritis in her mother and paternal grandmother; Asthma in her father and paternal grandmother; COPD in her maternal grandfather; Cancer in her mother; Depression in her mother and paternal grandmother; Diabetes in her paternal grandmother; Heart disease in her father and paternal grandmother; Hyperlipidemia in her father and paternal grandmother; Hypertension in her father and paternal grandmother.   HOME MEDICATIONS: Allergies as of 02/07/2021   No Known Allergies     Medication List       Accurate as of February 07, 2021  1:30 PM. If you have any questions, ask your nurse or doctor.        albuterol (2.5 MG/3ML) 0.083% nebulizer solution Commonly known as: PROVENTIL Take 3 mLs (2.5 mg total) by nebulization every 6 (six) hours as needed for wheezing or shortness of breath.   ProAir HFA 108 (90 Base) MCG/ACT inhaler Generic drug: albuterol TAKE 2 PUFFS BY MOUTH EVERY 6 HOURS AS NEEDED FOR WHEEZE OR SHORTNESS OF BREATH   cetirizine 10 MG tablet Commonly known as: ZYRTEC Take 2 tablets (20 mg total) by mouth 2 (two) times daily.   diphenhydrAMINE 25 MG tablet Commonly known as: BENADRYL Take 25 mg by mouth every 6 (six) hours as needed.   etonogestrel-ethinyl estradiol 0.12-0.015 MG/24HR vaginal ring Commonly known as: NUVARING Insert vaginally and leave in place for 3 consecutive weeks, then  remove for 1 week.   famotidine 20 MG tablet Commonly known as: PEPCID TAKE 1 TABLET BY MOUTH TWICE A DAY   Flovent HFA 110 MCG/ACT inhaler Generic drug: fluticasone Inhale 2 puffs into the lungs 2 (two) times daily.   levothyroxine 112 MCG tablet Commonly known as: SYNTHROID Take 1 tablet (112 mcg total) by mouth daily before breakfast.         REVIEW OF SYSTEMS: A comprehensive ROS was conducted with the patient and is negative except as per HPI     OBJECTIVE:  VS: BP 128/84   Pulse 88   Ht 5' (1.524 m)   Wt 157 lb 6 oz (71.4 kg)   SpO2 98%    BMI 30.74 kg/m    Wt Readings from Last 3 Encounters:  02/07/21 157 lb 6 oz (71.4 kg)  10/11/20 149 lb (67.6 kg)  10/10/20 149 lb 3.2 oz (67.7 kg)     EXAM: General: Pt appears well and is in NAD  Eyes: External eye exam normal without stare, lid lag or exophthalmos.  EOM intact.  PERRL.  Neck: General: Supple without adenopathy. Thyroid: Thyroid size normal.  No goiter or nodules appreciated.  Lungs: Clear with good BS bilat with no rales, rhonchi, or wheezes  Heart: Auscultation: RRR.  Abdomen: Normoactive bowel sounds, soft, nontender, without masses or organomegaly palpable     Skin: Hair: Texture and amount normal with gender appropriate distribution Skin Inspection: No rashes Skin Palpation: Skin temperature, texture, and thickness normal to palpation  Neuro: Cranial nerves: II - XII grossly intact  Motor: Normal strength throughout DTRs: 2+ and symmetric in UE without delay in relaxation phase  Mental Status: Judgment, insight: Intact Memory: Intact for recent and remote events Mood and affect: No depression, anxiety, or agitation     DATA REVIEWED: Results for Peggy [name], Peggy [name] (MRN [id number]) as of 02/08/2021 10:26  Ref. Range 02/07/2021 13:48  VITD Latest Ref Range: 30.00 - 100.00 ng/mL 22.56 (L)  TSH Latest Ref Range: 0.35 - 4.50 uIU/mL 53.72 (H)      ASSESSMENT/PLAN/RECOMMENDATIONS:   1. Postablative Hypothyroidism :    - S/P RAI ablation 01/2020 - Pt with fatigue and weight gain  - No local neck symptoms  -- Pt educated extensively on the correct way to take levothyroxine (first thing in the morning with water, 30 minutes before eating or taking other medications). - Pt encouraged to double dose the following day if she were to miss a dose given long half-life of levothyroxine. - TSh remains elevated , will adjust as below    Medications :  Stop Levothyoxine 112 mcg  Start Levothyroxine 150 mcg daily      2. Graves's Disease:   - No extra  thyroidal manifestations of graves' disease   3. Vitamin D insufficiency:   - Pt to start OTC Vitamin D3 1000 iu daily     F/U in 3 months  Labs in 6 weeks    Signed electronically by: Mack Guise, MD  [facility]  [address], [place] [address] Phone: [phone] FAX: [phone]   CC: Practice, [place] [address] Phone: [phone] Fax: [phone]   Return to Endocrinology clinic as below: No future appointments.

## 2021-02-07 NOTE — Patient Instructions (Signed)

## 2021-02-08 ENCOUNTER — Encounter: Payer: Self-pay | Admitting: Internal Medicine

## 2021-02-08 DIAGNOSIS — E0500 Thyrotoxicosis with diffuse goiter without thyrotoxic crisis or storm: Secondary | ICD-10-CM | POA: Insufficient documentation

## 2021-02-08 LAB — TSH: TSH: 53.72 u[IU]/mL — ABNORMAL HIGH (ref 0.35–4.50)

## 2021-02-08 MED ORDER — VITAMIN D-3 25 MCG (1000 UT) PO CAPS: ORAL_CAPSULE | ORAL | 3 refills | Status: DC

## 2021-02-08 MED ORDER — LEVOTHYROXINE SODIUM 150 MCG PO TABS: 150.0000 ug | ORAL_TABLET | Freq: Every day | ORAL | 6 refills | Status: DC

## 2021-02-14 MED ORDER — VITAMIN D-3 25 MCG (1000 UT) PO CAPS: 1.0000 | ORAL_CAPSULE | Freq: Every day | ORAL | 3 refills | Status: DC

## 2021-02-14 NOTE — Addendum Note (Signed)
Addended by: Jacqualin Combes on: 02/14/2021 02:26 PM   Modules accepted: Orders

## 2021-02-26 ENCOUNTER — Encounter: Payer: Self-pay | Admitting: Internal Medicine

## 2021-03-06 ENCOUNTER — Other Ambulatory Visit: Payer: Self-pay | Admitting: Allergy & Immunology

## 2021-03-20 ENCOUNTER — Other Ambulatory Visit: Payer: Self-pay | Admitting: Internal Medicine

## 2021-03-20 DIAGNOSIS — E890 Postprocedural hypothyroidism: Secondary | ICD-10-CM

## 2021-03-21 ENCOUNTER — Ambulatory Visit
Admission: EM | Admit: 2021-03-21 | Discharge: 2021-03-21 | Disposition: A | Discharge status: Home / Self Care | Payer: Medicaid Other | Attending: Emergency Medicine | Admitting: Emergency Medicine

## 2021-03-21 ENCOUNTER — Other Ambulatory Visit: Payer: Self-pay

## 2021-03-21 ENCOUNTER — Other Ambulatory Visit: Payer: Medicaid Other

## 2021-03-21 DIAGNOSIS — Z113 Encounter for screening for infections with a predominantly sexual mode of transmission: Secondary | ICD-10-CM | POA: Insufficient documentation

## 2021-03-21 DIAGNOSIS — R3989 Other symptoms and signs involving the genitourinary system: Secondary | ICD-10-CM | POA: Diagnosis not present

## 2021-03-21 LAB — POCT URINE PREGNANCY: Preg Test, Ur: NEGATIVE

## 2021-03-21 LAB — POCT URINALYSIS DIP (MANUAL ENTRY)
Bilirubin, UA: NEGATIVE
Glucose, UA: NEGATIVE mg/dL
Ketones, POC UA: NEGATIVE mg/dL
Leukocytes, UA: NEGATIVE
Nitrite, UA: NEGATIVE
Protein Ur, POC: NEGATIVE mg/dL
Spec Grav, UA: >=1.03 — AB (ref 1.010–1.025)
Urobilinogen, UA: 0.2 E.U./dL
pH, UA: 5.5 (ref 5.0–8.0)

## 2021-03-21 MED ORDER — PHENAZOPYRIDINE HCL 200 MG PO TABS: 200.0000 mg | ORAL_TABLET | Freq: Three times a day (TID) | ORAL | 0 refills | Status: DC

## 2021-03-21 NOTE — ED Provider Notes (Signed)
[facility]   CC: Bladder pressure  SUBJECTIVE:  Peggy [name] is a 30 y.o. female who complains of bladder pressure with full bladder x 1 day.  Last sex 2 days ago.  Reports 1 female partner over the past 6 months.  Requests STD testing today as well.  Denies abdominal or flank pain.  Has NOT tried OTC medications.  Symptoms are made worse with urination.  Admits to similar symptoms in the past with UTI.  Denies fever, chills, nausea, vomiting, abdominal pain, flank pain, dysuria, urinary frequency, urinary urgency, abnormal vaginal discharge or bleeding, vaginal odor, vaginal itching/ irritation, hematuria.    LMP: Patient's last menstrual period was 02/27/2021.  ROS: As in HPI.  All other pertinent ROS negative.     Past Medical History:  Diagnosis Date  . Abnormal Pap smear of cervix 06/22/2020   05/2020 pap LSIL negative HPV and GC/CHL  As per ASCCP guidelines repeat in 1 year, 5 year risk of CIN 3 + is 2%  . Asthma   . Gestational diabetes   . Pyloric stenosis   . Thyroid disease    Graves  . Urticaria    Past Surgical History:  Procedure Laterality Date  . PYLOROMYOTOMY     No Known Allergies No current facility-administered medications on file prior to encounter.   Current Outpatient Medications on File Prior to Encounter  Medication Sig Dispense Refill  . albuterol (PROVENTIL) (2.5 MG/3ML) 0.083% nebulizer solution Take 3 mLs (2.5 mg total) by nebulization every 6 (six) hours as needed for wheezing or shortness of breath. 75 mL 0  . cetirizine (ZYRTEC) 10 MG tablet Take 2 tablets (20 mg total) by mouth 2 (two) times daily. 120 tablet 5  . Cholecalciferol (VITAMIN D-3) 25 MCG (1000 UT) CAPS Take 1 capsule (1,000 Units total) by mouth daily. Daily 90 capsule 3  . diphenhydrAMINE (BENADRYL) 25 MG tablet Take 25 mg by mouth every 6 (six) hours as needed.    . etonogestrel-ethinyl estradiol (NUVARING) 0.12-0.015 MG/24HR vaginal ring Insert vaginally and leave in  place for 3 consecutive weeks, then remove for 1 week. 3 each 3  . famotidine (PEPCID) 20 MG tablet TAKE 1 TABLET BY MOUTH TWICE A DAY 60 tablet 0  . fluticasone (FLOVENT HFA) 110 MCG/ACT inhaler Inhale 2 puffs into the lungs 2 (two) times daily. 1 each 5  . levothyroxine (SYNTHROID) 150 MCG tablet Take 1 tablet (150 mcg total) by mouth daily. 30 tablet 6  . PROAIR HFA 108 (90 Base) MCG/ACT inhaler TAKE 2 PUFFS BY MOUTH EVERY 6 HOURS AS NEEDED FOR WHEEZE OR SHORTNESS OF BREATH 17 each 2   Social History   Socioeconomic History  . Marital status: Divorced    Spouse name: Mckenize Mezera  . Number of children: 2  . Years of education: Not on file  . Highest education level: Some college, no degree  Occupational History  . Not on file  Tobacco Use  . Smoking status: Current Every Day Smoker    Packs/day: 0.25    Years: 5.00    Pack years: 1.25    Types: Cigarettes  . Smokeless tobacco: Never Used  . Tobacco comment: 2 cig/day  Vaping Use  . Vaping Use: Never used  Substance and Sexual Activity  . Alcohol use: Yes    Comment: rarely  . Drug use: No  . Sexual activity: Yes    Birth control/protection: Diaphragm  Other Topics Concern  . Not on file  Social  History Narrative  . Not on file   Social Determinants of Health   Financial Resource Strain: Low Risk   . Difficulty of Paying Living Expenses: Not hard at all  Food Insecurity: No Food Insecurity  . Worried About Charity fundraiser in the Last Year: Never true  . Ran Out of Food in the Last Year: Never true  Transportation Needs: No Transportation Needs  . Lack of Transportation (Medical): No  . Lack of Transportation (Non-Medical): No  Physical Activity: Sufficiently Active  . Days of Exercise per Week: 5 days  . Minutes of Exercise per Session: 90 min  Stress: Stress Concern Present  . Feeling of Stress : To some extent  Social Connections: Socially Isolated  . Frequency of Communication with Friends and Family:  More than three times a week  . Frequency of Social Gatherings with Friends and Family: Once a week  . Attends Religious Services: Never  . Active Member of Clubs or Organizations: No  . Attends Archivist Meetings: Never  . Marital Status: Divorced  Human resources officer Violence: Not At Risk  . Fear of Current or Ex-Partner: No  . Emotionally Abused: No  . Physically Abused: No  . Sexually Abused: No   Family History  Problem Relation Age of Onset  . Arthritis Mother   . Cancer Mother        thyroid  . Depression Mother   . Hyperlipidemia Father   . Asthma Father   . Hypertension Father   . Heart disease Father   . COPD Maternal Grandfather   . Diabetes Paternal Grandmother   . Arthritis Paternal Grandmother   . Asthma Paternal Grandmother   . Heart disease Paternal Grandmother   . Depression Paternal Grandmother   . Hypertension Paternal Grandmother   . Hyperlipidemia Paternal Grandmother   . Other Neg Hx     OBJECTIVE:  Vitals:   03/21/21 0941  BP: 126/81  Pulse: 92  Resp: 20  Temp: 98.7 F (37.1 C)  SpO2: 96%   General appearance: AOx3 in no acute distress HEENT: NCAT.  Oropharynx clear.  Lungs: clear to auscultation bilaterally without adventitious breath sounds Heart: regular rate and rhythm.   Abdomen: soft; non-distended; no tenderness; bowel sounds present; no guarding Back: no CVA tenderness Extremities: no edema; symmetrical with no gross deformities Skin: warm and dry Neurologic: Ambulates from chair to exam table without difficulty Psychological: alert and cooperative; normal mood and affect  Labs Reviewed  POCT URINALYSIS DIP (MANUAL ENTRY) - Abnormal; Notable for the following components:      Result Value   Spec Grav, UA >=1.030 (*)    Blood, UA trace-intact (*)    All other components within normal limits  URINE CULTURE  POCT URINE PREGNANCY  CERVICOVAGINAL ANCILLARY ONLY    ASSESSMENT & PLAN:  1. Sensation of pressure in  bladder area   2. Screening examination for venereal disease     Meds ordered this encounter  Medications  . phenazopyridine (PYRIDIUM) 200 MG tablet    Sig: Take 1 tablet (200 mg total) by mouth 3 (three) times daily.    Dispense:  6 tablet    Refill:  0    Order Specific Question:   Supervising Provider    Answer:   Raylene Everts [[id number]]   Urine pregnancy negative Urine without signs of infection Urine culture and vaginal swab sent.  We will call you with abnormal results.   Push fluids and get plenty  of rest.   Take pyridium as prescribed and as needed for symptomatic relief Follow up with PCP if symptoms persists Return here or go to ER if you have any new or worsening symptoms such as fever, worsening abdominal pain, nausea/vomiting, flank pain, etc...  Outlined signs and symptoms indicating need for more acute intervention. Patient verbalized understanding. After Visit Summary given.     Jalyne, Brodzinski, PA-C 03/21/21 1007

## 2021-03-21 NOTE — Discharge Instructions (Signed)
Urine pregnancy negative Urine without signs of infection Urine culture and vaginal swab sent.  We will call you with abnormal results.   Push fluids and get plenty of rest.   Take pyridium as prescribed and as needed for symptomatic relief Follow up with PCP if symptoms persists Return here or go to ER if you have any new or worsening symptoms such as fever, worsening abdominal pain, nausea/vomiting, flank pain, etc..Marland Kitchen

## 2021-03-22 ENCOUNTER — Other Ambulatory Visit: Payer: Self-pay | Admitting: Advanced Practice Midwife

## 2021-03-22 ENCOUNTER — Telehealth (HOSPITAL_COMMUNITY): Payer: Self-pay | Admitting: Emergency Medicine

## 2021-03-22 ENCOUNTER — Encounter: Payer: Self-pay | Admitting: Advanced Practice Midwife

## 2021-03-22 ENCOUNTER — Ambulatory Visit (INDEPENDENT_AMBULATORY_CARE_PROVIDER_SITE_OTHER): Payer: Medicaid Other | Admitting: Advanced Practice Midwife

## 2021-03-22 ENCOUNTER — Other Ambulatory Visit: Payer: Self-pay

## 2021-03-22 DIAGNOSIS — R300 Dysuria: Secondary | ICD-10-CM

## 2021-03-22 LAB — POCT URINALYSIS DIPSTICK OB
Blood, UA: NEGATIVE
Glucose, UA: NEGATIVE
Ketones, UA: NEGATIVE
Leukocytes, UA: NEGATIVE
Nitrite, UA: NEGATIVE
POC,PROTEIN,UA: NEGATIVE

## 2021-03-22 LAB — CERVICOVAGINAL ANCILLARY ONLY
Bacterial Vaginitis (gardnerella): POSITIVE — AB
Candida Glabrata: NEGATIVE
Candida Vaginitis: NEGATIVE
Chlamydia: NEGATIVE
Comment: NEGATIVE
Comment: NEGATIVE
Comment: NEGATIVE
Comment: NEGATIVE
Comment: NEGATIVE
Comment: NORMAL
Neisseria Gonorrhea: NEGATIVE
Trichomonas: NEGATIVE

## 2021-03-22 MED ORDER — METRONIDAZOLE 500 MG PO TABS: 500.0000 mg | ORAL_TABLET | Freq: Two times a day (BID) | ORAL | 0 refills | Status: DC

## 2021-03-22 NOTE — Progress Notes (Signed)
[facility] Visit  Patient name: ZEMA LIZARDO MRN [id number]  Date of birth: [birth year]  CC & HPI:  Peggy [name] is a 30 y.o. Caucasian female presenting today for bladder pressure when her bladder gets full. Went to urgent care yesterday, had trace blood.  Urine and STD cultures in progress. Pt hasn't picked up rx for pyridium.   Pertinent History Reviewed:  Medical & Surgical Hx:   Past Medical History:  Diagnosis Date  . Abnormal Pap smear of cervix 06/22/2020   05/2020 pap LSIL negative HPV and GC/CHL  As per ASCCP guidelines repeat in 1 year, 5 year risk of CIN 3 + is 2%  . Asthma   . Gestational diabetes   . Pyloric stenosis   . Thyroid disease    Graves  . Urticaria    Past Surgical History:  Procedure Laterality Date  . PYLOROMYOTOMY     Family History  Problem Relation Age of Onset  . Arthritis Mother   . Cancer Mother        thyroid  . Depression Mother   . Hyperlipidemia Father   . Asthma Father   . Hypertension Father   . Heart disease Father   . COPD Maternal Grandfather   . Diabetes Paternal Grandmother   . Arthritis Paternal Grandmother   . Asthma Paternal Grandmother   . Heart disease Paternal Grandmother   . Depression Paternal Grandmother   . Hypertension Paternal Grandmother   . Hyperlipidemia Paternal Grandmother   . Other Neg Hx     Current Outpatient Medications:  .  albuterol (PROVENTIL) (2.5 MG/3ML) 0.083% nebulizer solution, Take 3 mLs (2.5 mg total) by nebulization every 6 (six) hours as needed for wheezing or shortness of breath., Disp: 75 mL, Rfl: 0 .  cetirizine (ZYRTEC) 10 MG tablet, Take 2 tablets (20 mg total) by mouth 2 (two) times daily., Disp: 120 tablet, Rfl: 5 .  Cholecalciferol (VITAMIN D-3) 25 MCG (1000 UT) CAPS, Take 1 capsule (1,000 Units total) by mouth daily. Daily, Disp: 90 capsule, Rfl: 3 .  diphenhydrAMINE (BENADRYL) 25 MG tablet, Take 25 mg by mouth every 6 (six) hours as needed., Disp: , Rfl:  .   etonogestrel-ethinyl estradiol (NUVARING) 0.12-0.015 MG/24HR vaginal ring, Insert vaginally and leave in place for 3 consecutive weeks, then remove for 1 week., Disp: 3 each, Rfl: 3 .  famotidine (PEPCID) 20 MG tablet, TAKE 1 TABLET BY MOUTH TWICE A DAY, Disp: 60 tablet, Rfl: 0 .  fluticasone (FLOVENT HFA) 110 MCG/ACT inhaler, Inhale 2 puffs into the lungs 2 (two) times daily., Disp: 1 each, Rfl: 5 .  levothyroxine (SYNTHROID) 150 MCG tablet, Take 1 tablet (150 mcg total) by mouth daily., Disp: 30 tablet, Rfl: 6 .  PROAIR HFA 108 (90 Base) MCG/ACT inhaler, TAKE 2 PUFFS BY MOUTH EVERY 6 HOURS AS NEEDED FOR WHEEZE OR SHORTNESS OF BREATH, Disp: 17 each, Rfl: 2 .  phenazopyridine (PYRIDIUM) 200 MG tablet, Take 1 tablet (200 mg total) by mouth 3 (three) times daily. (Patient not taking: Reported on 03/22/2021), Disp: 6 tablet, Rfl: 0 Social History: Reviewed -  reports that she has been smoking cigarettes. She has a 1.25 pack-year smoking history. She has never used smokeless tobacco.  Review of Systems:   Constitutional: Negative for fever and chills Eyes: Negative for visual disturbances Respiratory: Negative for shortness of breath, dyspnea Cardiovascular: Negative for chest pain or palpitations  Gastrointestinal: Negative for vomiting, diarrhea and constipation; no abdominal pain Genitourinary: Negative  for dysuria and urgency, vaginal irritation or itching Musculoskeletal: Negative for back pain, joint pain, myalgias  Neurological: Negative for dizziness and headaches    Objective Findings:    Physical Examination: Vitals:   03/22/21 1125  BP: 130/81  Pulse: 86   General appearance - well appearing, and in no distress Mental status - alert, oriented to person, place, and time Chest:  Normal respiratory effort Heart - normal rate and regular rhythm Abdomen:  Soft, nontender Pelvic: deferred Musculoskeletal:  Normal range of motion without pain Extremities:  No edema    Results  for orders placed or performed in visit on 03/22/21 (from the past 24 hour(s))  POC Urinalysis Dipstick OB   Collection Time: 03/22/21 11:28 AM  Result Value Ref Range   Color, UA     Clarity, UA     Glucose, UA Negative Negative   Bilirubin, UA     Ketones, UA neg    Spec Grav, UA     Blood, UA neg    pH, UA     POC,PROTEIN,UA Negative Negative, Trace, Small (1+), Moderate (2+), Large (3+), 4+   Urobilinogen, UA     Nitrite, UA neg    Leukocytes, UA Negative Negative   Appearance     Odor        Assessment & Plan:  A:   ? UTI vs bladder spasms vs something else P:  Wait on cx results  If sx persist and not infectious, consider antispasmodic  Pick up pyridium   No follow-ups on file.  Christin Fudge CNM 03/22/2021 11:37 AM

## 2021-03-23 LAB — URINE CULTURE: Culture: 50000 — AB

## 2021-03-28 ENCOUNTER — Other Ambulatory Visit: Payer: Self-pay

## 2021-03-28 ENCOUNTER — Ambulatory Visit (INDEPENDENT_AMBULATORY_CARE_PROVIDER_SITE_OTHER): Payer: Medicaid Other | Admitting: Family

## 2021-03-28 ENCOUNTER — Encounter: Payer: Self-pay | Admitting: Family

## 2021-03-28 VITALS — BP 118/78 | HR 83 | Temp 97.3°F | Resp 18 | Ht 60.0 in | Wt 159.2 lb

## 2021-03-28 DIAGNOSIS — J3089 Other allergic rhinitis: Secondary | ICD-10-CM | POA: Diagnosis not present

## 2021-03-28 DIAGNOSIS — L508 Other urticaria: Secondary | ICD-10-CM | POA: Diagnosis not present

## 2021-03-28 DIAGNOSIS — J302 Other seasonal allergic rhinitis: Secondary | ICD-10-CM | POA: Diagnosis not present

## 2021-03-28 DIAGNOSIS — J4530 Mild persistent asthma, uncomplicated: Secondary | ICD-10-CM | POA: Diagnosis not present

## 2021-03-28 DIAGNOSIS — B999 Unspecified infectious disease: Secondary | ICD-10-CM

## 2021-03-28 MED ORDER — ALBUTEROL SULFATE HFA 108 (90 BASE) MCG/ACT IN AERS: INHALATION_SPRAY | RESPIRATORY_TRACT | 1 refills | Status: DC

## 2021-03-28 MED ORDER — BUDESONIDE-FORMOTEROL FUMARATE 80-4.5 MCG/ACT IN AERO: INHALATION_SPRAY | RESPIRATORY_TRACT | 3 refills | Status: DC

## 2021-03-28 NOTE — Progress Notes (Signed)
[place], [place] [address] Dept: [phone]  FOLLOW UP NOTE  Patient ID: Alvia Grove, female    DOB: 1991/08/03  Age: 30 y.o. MRN: [id number]  Date of Office Visit: 03/28/2021  Assessment  Chief Complaint: Asthma  HPI Peggy [name] is a 30 year old female who presents today for follow-up of chronic idiopathic urticaria, seasonal and perennial allergic rhinitis, recurrent infections, and mild persistent asthma.  She was last seen on October 11, 2020 by Dr. Ernst Bowler.   Chronic urticaria is reported as doing much better with Zyrtec 2 tablets twice a day and famotidine 1 tablet twice a day.  She reports that she will still have hives, but they are every now and then and are not occurring every day.  She also mentions that when they do occur it is just a spot rather than a big patch as it previously was.  She did not get the lab work that was ordered by Dr. Ernst Bowler at her last office visit.  She reports that she had car issues for a couple months but will get this done.  Seasonal and perennial allergic rhinitis is reported as controlled with cetirizine 2 tablets twice a day.  She denies any rhinorrhea, nasal congestion, and postnasal drip.  Since her last office visit she is not had any sinus infections.  She reports that she has not had any infections since her last office visit.  She did not get the recurrent infections lab work either that was ordered at her last office visit.  She reports that she will get this done.  Mild persistent asthma is reported as not well controlled with Flovent 110 mcg 1 puff twice a day and albuterol as needed.  She reports tightness in her chest that also feels heavy and also occasional shortness of breath.  She denies any coughing or wheezing.  Since her last office visit she has not required any systemic steroids or made any trips to the emergency room or urgent care due to breathing problems.  She uses her albuterol a  couple times every day.  She does report that when she has the heaviness/tightness in her chest that her albuterol does relieve the symptoms.  She does smoke 2 cigarettes a day.   Drug Allergies:  No Known Allergies  Review of Systems: Review of Systems  Constitutional: Negative for chills and fever.  HENT:       Denies rhinorrhea, nasal congestion and post nasal drip  Eyes:       Denies itchy watery eyes  Respiratory: Positive for shortness of breath. Negative for cough and wheezing.        Reports occasional shortness of breath. Also reports that she continues to have heaviness in chest that comes and goes  Cardiovascular: Negative for chest pain and palpitations.  Gastrointestinal: Negative for heartburn.  Genitourinary:       Reports uncomfortable when she urinates. Went to urgent care and told no infection.Going to call OBGYN tomorrow for an appointment  Skin:       Reports occasional hives now  Neurological: Negative for headaches.       Reports occasional headaches  Endo/Heme/Allergies: Positive for environmental allergies.     Physical Exam: BP 118/78 (BP Location: Left Arm, Patient Position: Sitting, Cuff Size: Normal)   Pulse 83   Temp (!) 97.3 F (36.3 C) (Temporal)   Resp 18   Ht 5' (1.524 m)   Wt 159 lb 3.2 oz (72.2 kg)  LMP 02/27/2021   SpO2 98%   BMI 31.09 kg/m    Physical Exam Constitutional:      Appearance: Normal appearance.  HENT:     Head: Normocephalic and atraumatic.     Comments: Pharynx normal, eyes normal, ears normal, nose bilateral lower turbinates mildly edematous and slightly erythematous with no drainage noted    Right Ear: Tympanic membrane, ear canal and external ear normal.     Left Ear: Tympanic membrane, ear canal and external ear normal.     Mouth/Throat:     Mouth: Mucous membranes are moist.     Pharynx: Oropharynx is clear.  Eyes:     Conjunctiva/sclera: Conjunctivae normal.  Cardiovascular:     Rate and Rhythm: Regular  rhythm.     Heart sounds: Normal heart sounds.  Pulmonary:     Effort: Pulmonary effort is normal.     Breath sounds: Normal breath sounds.     Comments: Lungs clear to auscultation Musculoskeletal:     Cervical back: Neck supple.  Skin:    General: Skin is warm.     Comments: No urticarial lesions or rash noted  Neurological:     Mental Status: She is alert and oriented to person, place, and time.  Psychiatric:        Mood and Affect: Mood normal.        Behavior: Behavior normal.        Thought Content: Thought content normal.        Judgment: Judgment normal.     Diagnostics: FVC 3.32 L, FEV1 2.39 L.  Predicted FVC 3.29 L, FEV1 2.85 L.  Spirometry indicates normal ventilatory function.  Status post bronchodilator response shows FVC 3.42 L, FEV1 2.57 L spirometry indicates normal ventilatory function with a 8% change in FEV1.  Assessment and Plan: 1. Not well controlled mild persistent asthma   2. Chronic urticaria   3. Seasonal and perennial allergic rhinitis   4. Recurrent infections     Meds ordered this encounter  Medications  . budesonide-formoterol (SYMBICORT) 80-4.5 MCG/ACT inhaler    Sig: Use 2 puffs twice a day with spacer to help prevent cough and wheeze    Dispense:  1 each    Refill:  3  . albuterol (PROAIR HFA) 108 (90 Base) MCG/ACT inhaler    Sig: TAKE 2 PUFFS BY MOUTH EVERY 4- 6 HOURS AS NEEDED FOR WHEEZE OR SHORTNESS OF BREATH    Dispense:  17 each    Refill:  1    Patient Instructions  1. Chronic urticaria.  Please get the lab work that was ordered at your last office visit.  - In the meantime, start suppressive dosing of antihistamines:   - Morning: Zyrtec (cetirizine) 20mg  (two tablets) + Pepcid (famotidine) 20mg   - Evening: Zyrtec (cetirizine) 20mg  (two tablets) + Pepcid (famotidine) 20mg  - You can change this dosing at home, decreasing the dose as needed or increasing the dosing as needed.  - If you are not tolerating the medications or are  tired of taking them every day, we can start treatment with a monthly injectable medication called Xolair.   2. Seasonal and perennial allergic rhinitis (grasses, trees, indoor molds, dust mites, cat,dog, and cockroach) - Start taking: antihistamines as above - You can use an extra dose of the antihistamine, if needed, for breakthrough symptoms.  - Consider nasal saline rinses 1-2 times daily to remove allergens from the nasal cavities as well as help with mucous clearance (this is especially helpful  to do before the nasal sprays are given) - Consider allergy shots as a means of long-term control. - Allergy shots "re-train" and "reset" the immune system to ignore environmental allergens and decrease the resulting immune response to those allergens (sneezing, itchy watery eyes, runny nose, nasal congestion, etc).    - Allergy shots improve symptoms in 75-85% of patients.  - We can discuss more at the next appointment if the medications are not working for you.  3. Recurrent infections - Please get the lab work that was ordered at your last office visit.  4. Mild persistent asthma, uncomplicated -Stop Flovent 110 mcg - Daily controller medication(s): Start Symbicort 80/4.5 2 puffs twice a day with spacer to help prevent cough and wheeze - Prior to physical activity: albuterol 2 puffs 10-15 minutes before physical activity. - Rescue medications: albuterol 4 puffs every 4-6 hours as needed - Changes during respiratory infections or worsening symptoms: Increase Flovent to 2 puffs twice daily for TWO WEEKS. - Asthma control goals:  * Full participation in all desired activities (may need albuterol before activity) * Albuterol use two time or less a week on average (not counting use with activity) * Cough interfering with sleep two time or less a month * Oral steroids no more than once a year * No hospitalizations  5.Smoking Discussed stopping smoking and the benefits of stopping  smoking  Please let us know if this treatment plan is not working well for you Schedule a follow-up appointment in 4 weeks or sooner if needed           Return in about 4 weeks (around 04/25/2021), or if symptoms worsen or fail to improve.    Thank you for the opportunity to care for this patient.  Please do not hesitate to contact me with questions.  Althea Charon, FNP [facility] [place] [facility] [place]

## 2021-03-28 NOTE — Patient Instructions (Addendum)
1. Chronic urticaria.  Please get the lab work that was ordered at your last office visit.  - In the meantime, start suppressive dosing of antihistamines:   - Morning: Zyrtec (cetirizine) 20mg  (two tablets) + Pepcid (famotidine) 20mg   - Evening: Zyrtec (cetirizine) 20mg  (two tablets) + Pepcid (famotidine) 20mg  - You can change this dosing at home, decreasing the dose as needed or increasing the dosing as needed.  - If you are not tolerating the medications or are tired of taking them every day, we can start treatment with a monthly injectable medication called Xolair.   2. Seasonal and perennial allergic rhinitis (grasses, trees, indoor molds, dust mites, cat,dog, and cockroach) - Start taking: antihistamines as above - You can use an extra dose of the antihistamine, if needed, for breakthrough symptoms.  - Consider nasal saline rinses 1-2 times daily to remove allergens from the nasal cavities as well as help with mucous clearance (this is especially helpful to do before the nasal sprays are given) - Consider allergy shots as a means of long-term control. - Allergy shots "re-train" and "reset" the immune system to ignore environmental allergens and decrease the resulting immune response to those allergens (sneezing, itchy watery eyes, runny nose, nasal congestion, etc).    - Allergy shots improve symptoms in 75-85% of patients.  - We can discuss more at the next appointment if the medications are not working for you.  3. Recurrent infections - Please get the lab work that was ordered at your last office visit.  4. Mild persistent asthma, uncomplicated -Stop Flovent 110 mcg - Daily controller medication(s): Start Symbicort 80/4.5 2 puffs twice a day with spacer to help prevent cough and wheeze - Prior to physical activity: albuterol 2 puffs 10-15 minutes before physical activity. - Rescue medications: albuterol 4 puffs every 4-6 hours as needed - Changes during respiratory infections or  worsening symptoms: Increase Flovent to 2 puffs twice daily for TWO WEEKS. - Asthma control goals:  * Full participation in all desired activities (may need albuterol before activity) * Albuterol use two time or less a week on average (not counting use with activity) * Cough interfering with sleep two time or less a month * Oral steroids no more than once a year * No hospitalizations  5.Smoking Discussed stopping smoking and the benefits of stopping smoking  Please let us know if this treatment plan is not working well for you Schedule a follow-up appointment in 4 weeks or sooner if needed

## 2021-04-23 ENCOUNTER — Ambulatory Visit
Admission: EM | Admit: 2021-04-23 | Discharge: 2021-04-23 | Disposition: A | Discharge status: Home / Self Care | Payer: Medicaid Other | Attending: Family Medicine | Admitting: Family Medicine

## 2021-04-23 ENCOUNTER — Encounter: Payer: Self-pay | Admitting: Emergency Medicine

## 2021-04-23 ENCOUNTER — Other Ambulatory Visit: Payer: Self-pay

## 2021-04-23 DIAGNOSIS — J209 Acute bronchitis, unspecified: Secondary | ICD-10-CM | POA: Diagnosis not present

## 2021-04-23 DIAGNOSIS — N898 Other specified noninflammatory disorders of vagina: Secondary | ICD-10-CM

## 2021-04-23 DIAGNOSIS — Z1152 Encounter for screening for COVID-19: Secondary | ICD-10-CM

## 2021-04-23 DIAGNOSIS — J0140 Acute pansinusitis, unspecified: Secondary | ICD-10-CM | POA: Diagnosis not present

## 2021-04-23 MED ORDER — PROMETHAZINE-DM 6.25-15 MG/5ML PO SYRP: 5.0000 mL | ORAL_SOLUTION | Freq: Three times a day (TID) | ORAL | 0 refills | Status: DC | PRN

## 2021-04-23 MED ORDER — PREDNISONE 20 MG PO TABS: 40.0000 mg | ORAL_TABLET | Freq: Every day | ORAL | 0 refills | Status: DC

## 2021-04-23 MED ORDER — AMOXICILLIN-POT CLAVULANATE 875-125 MG PO TABS: 1.0000 | ORAL_TABLET | Freq: Two times a day (BID) | ORAL | 0 refills | Status: DC

## 2021-04-23 NOTE — Discharge Instructions (Addendum)
Resume use of Symbicort. Take all medication as prescribed. Continue current asthma and allergy regimen.

## 2021-04-23 NOTE — ED Triage Notes (Signed)
Nasal and chest congestion since Friday.  Son is sick with similar s/s and daycare is closed for covid.

## 2021-04-23 NOTE — ED Provider Notes (Signed)
[facility]    CSN: [id number] Arrival date & time: 04/23/21  1009      History   Chief Complaint Chief Complaint  Patient presents with  . Nasal Congestion    HPI Peggy [name] is a 30 y.o. female.   HPI Nasal congestion, headache, wheezing, post nasal congestion, nausea. Afebrile. Hot and cold flashes at home. Taking multiple OTC medication without relief. Current smoker. Requests a recheck of BV., Treated in march for BV wants to ensure symptoms have resolved. Vaginal irritation never resolved even after treatment Past Medical History:  Diagnosis Date  . Abnormal Pap smear of cervix 06/22/2020   05/2020 pap LSIL negative HPV and GC/CHL  As per ASCCP guidelines repeat in 1 year, 5 year risk of CIN 3 + is 2%  . Asthma   . Gestational diabetes   . Pyloric stenosis   . Thyroid disease    Graves  . Urticaria     Patient Active Problem List   Diagnosis Date Noted  . Graves disease 02/08/2021  . Vitamin D insufficiency 02/07/2021  . 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 Chronic urticaria 10/12/2020  . Recurrent infections 10/12/2020  . Seasonal and perennial allergic rhinitis 10/12/2020  . Mild persistent asthma, uncomplicated 69/62/9528  . Abnormal Pap smear of cervix 06/22/2020  . 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/09/2020  . Graves' disease 01/24/2020  . Depression screening 05/25/2019  . History of gestational diabetes 10/28/2018  . Low vitamin D level 02/12/2017  . Smoker 07/25/2014    Past Surgical History:  Procedure Laterality Date  . PYLOROMYOTOMY      OB History    Gravida  4   Para  4   Term  4   Preterm  0   AB  0   Living  4     SAB  0   IAB  0   Ectopic  0   Multiple  0   Live Births  4            Home Medications    Prior to Admission medications   Medication Sig Start Date End Date Taking? Authorizing Provider  amoxicillin-clavulanate (AUGMENTIN) 875-125 MG tablet Take 1 tablet by  mouth 2 (two) times daily. 04/23/21  Yes Scot Jun, FNP  predniSONE (DELTASONE) 20 MG tablet Take 2 tablets (40 mg total) by mouth daily with breakfast. 04/23/21  Yes Scot Jun, FNP  promethazine-dextromethorphan (PROMETHAZINE-DM) 6.25-15 MG/5ML syrup Take 5 mLs by mouth 3 (three) times daily as needed for cough. 04/23/21  Yes Scot Jun, FNP  albuterol [facility] HFA) 108 (90 Base) MCG/ACT inhaler TAKE 2 PUFFS BY MOUTH EVERY 4- 6 HOURS AS NEEDED FOR WHEEZE OR SHORTNESS OF BREATH 03/28/21   Althea Charon, FNP  albuterol (PROVENTIL) (2.5 MG/3ML) 0.083% nebulizer solution Take 3 mLs (2.5 mg total) by nebulization every 6 (six) hours as needed for wheezing or shortness of breath. 08/27/20   Triplett, Tammy, PA-C  budesonide-formoterol (SYMBICORT) 80-4.5 MCG/ACT inhaler Use 2 puffs twice a day with spacer to help prevent cough and wheeze 03/28/21   Althea Charon, FNP  cetirizine (ZYRTEC) 10 MG tablet Take 2 tablets (20 mg total) by mouth 2 (two) times daily. 10/11/20   Valentina Shaggy, MD  Cholecalciferol (VITAMIN D-3) 25 MCG (1000 UT) CAPS Take 1 capsule (1,000 Units total) by mouth daily. Daily 02/14/21   Shamleffer, Melanie Crazier, MD  diphenhydrAMINE (BENADRYL) 25 MG tablet Take 25 mg by mouth every 6 (six) hours  as needed.    [provider]  etonogestrel-ethinyl estradiol (NUVARING) 0.12-0.015 MG/24HR vaginal ring Insert vaginally and leave in place for 3 consecutive weeks, then remove for 1 week. 09/15/20   Roma Schanz, CNM  famotidine (PEPCID) 20 MG tablet TAKE 1 TABLET BY MOUTH TWICE A DAY 02/05/21   Valentina Shaggy, MD  fluticasone [facility] HFA) 110 MCG/ACT inhaler Inhale 2 puffs into the lungs 2 (two) times daily. 10/11/20   Valentina Shaggy, MD  levothyroxine (SYNTHROID) 150 MCG tablet Take 1 tablet (150 mcg total) by mouth daily. 02/08/21   Shamleffer, Melanie Crazier, MD  metroNIDAZOLE (FLAGYL) 500 MG tablet Take 1 tablet (500 mg total) by  mouth 2 (two) times daily. 03/22/21   LampteyMyrene Galas, MD  phenazopyridine (PYRIDIUM) 200 MG tablet Take 1 tablet (200 mg total) by mouth 3 (three) times daily. 03/21/21   Lestine Box, PA-C    Family History Family History  Problem Relation Age of Onset  . Arthritis Mother   . Cancer Mother        thyroid  . Depression Mother   . Hyperlipidemia Father   . Asthma Father   . Hypertension Father   . Heart disease Father   . COPD Maternal Grandfather   . Diabetes Paternal Grandmother   . Arthritis Paternal Grandmother   . Asthma Paternal Grandmother   . Heart disease Paternal Grandmother   . Depression Paternal Grandmother   . Hypertension Paternal Grandmother   . Hyperlipidemia Paternal Grandmother   . Other Neg Hx     Social History Social History   Tobacco Use  . Smoking status: Current Every Day Smoker    Packs/day: 0.25    Years: 5.00    Pack years: 1.25    Types: Cigarettes  . Smokeless tobacco: Never Used  . Tobacco comment: 2 cig/day  Vaping Use  . Vaping Use: Never used  Substance Use Topics  . Alcohol use: Yes    Comment: rarely  . Drug use: No     Allergies   Patient has no known allergies.   Review of Systems Review of Systems Pertinent negatives listed in HPI  Physical Exam Triage Vital Signs ED Triage Vitals  Enc Vitals Group     BP --      Pulse Rate 04/23/21 1058 96     Resp 04/23/21 1058 18     Temp 04/23/21 1058 97.6 F (36.4 C)     Temp Source 04/23/21 1058 Temporal     SpO2 04/23/21 1058 96 %     Weight --      Height --      Head Circumference --      Peak Flow --      Pain Score 04/23/21 1101 0     Pain Loc --      Pain Edu? --      Excl. in [place]? --    No data found.  Updated Vital Signs Pulse 96   Temp 97.6 F (36.4 C) (Temporal)   Resp 18   SpO2 96%   Visual Acuity Right Eye Distance:   Left Eye Distance:   Bilateral Distance:    Right Eye Near:   Left Eye Near:    Bilateral Near:     Physical  Exam General appearance: alert, Ill-appearing, no distress Head: Normocephalic, without obvious abnormality, atraumatic ENT: Ears normal, mucosal edema, congestion, erythematous oropharynx w/o exudate Respiratory: Respirations even , unlabored, coarse lung sound, expiratory wheeze Heart:  rate and rhythm normal. No gallop or murmurs noted on exam  Abdomen: BS +, no distention, no rebound tenderness, or no mass Extremities: No gross deformities Skin: Skin color, texture, turgor normal. No rashes seen  Psych: Appropriate mood and affect. Neurologic: GCS 15, normal gait,normal coordination.   UC Treatments / Results  Labs (all labs ordered are listed, but only abnormal results are displayed) Labs Reviewed  COVID-19, FLU A+B NAA  CERVICOVAGINAL ANCILLARY ONLY    EKG   Radiology No results found.  Procedures Procedures (including critical care time)  Medications Ordered in UC Medications - No data to display  Initial Impression / Assessment and Plan / UC Course  I have reviewed the triage vital signs and the nursing notes.  Pertinent labs & imaging results that were available during my care of the patient were reviewed by me and considered in my medical decision making (see chart for details).    COVID /flu test pending. Treating for acute bronchitis and acute nonrecurrent pansinusitis.  Covering with antibiotic therapy however encouraged patient to resume chronic asthma /allergy regimen including use of Symbicort to prevent reactive airway symptoms.  Treatment per discharge medications.  Follow-up with primary care and medicine specialist as needed.   Final Clinical Impressions(s) / UC Diagnoses   Final diagnoses:  Encounter for screening for COVID-19  Acute bronchitis, unspecified organism  Acute non-recurrent pansinusitis  Vaginal irritation     Discharge Instructions     Resume use of Symbicort. Take all medication as prescribed. Continue current asthma and  allergy regimen.    ED Prescriptions    Medication Sig Dispense Auth. Provider   amoxicillin-clavulanate (AUGMENTIN) 875-125 MG tablet Take 1 tablet by mouth 2 (two) times daily. 20 tablet Scot Jun, FNP   predniSONE (DELTASONE) 20 MG tablet Take 2 tablets (40 mg total) by mouth daily with breakfast. 10 tablet Scot Jun, FNP   promethazine-dextromethorphan (PROMETHAZINE-DM) 6.25-15 MG/5ML syrup Take 5 mLs by mouth 3 (three) times daily as needed for cough. 118 mL Scot Jun, FNP     PDMP not reviewed this encounter.   Scot Jun, FNP 04/23/21 1231

## 2021-04-24 LAB — CERVICOVAGINAL ANCILLARY ONLY
Bacterial Vaginitis (gardnerella): POSITIVE — AB
Candida Glabrata: NEGATIVE
Candida Vaginitis: NEGATIVE
Chlamydia: NEGATIVE
Comment: NEGATIVE
Comment: NEGATIVE
Comment: NEGATIVE
Comment: NEGATIVE
Comment: NEGATIVE
Comment: NORMAL
Neisseria Gonorrhea: NEGATIVE
Trichomonas: NEGATIVE

## 2021-04-24 LAB — COVID-19, FLU A+B NAA
Influenza A, NAA: NOT DETECTED
Influenza B, NAA: NOT DETECTED
SARS-CoV-2, NAA: NOT DETECTED

## 2021-04-24 NOTE — Patient Instructions (Incomplete)
1. Chronic urticaria.  Please get the lab work that was ordered at your last office visit.  - In the meantime, start suppressive dosing of antihistamines:   - Morning: Zyrtec (cetirizine) 20mg  (two tablets) + Pepcid (famotidine) 20mg   - Evening: Zyrtec (cetirizine) 20mg  (two tablets) + Pepcid (famotidine) 20mg  - You can change this dosing at home, decreasing the dose as needed or increasing the dosing as needed.  - If you are not tolerating the medications or are tired of taking them every day, we can start treatment with a monthly injectable medication called Xolair.   2. Seasonal and perennial allergic rhinitis (grasses, trees, indoor molds, dust mites, cat,dog, and cockroach) - Continue taking: antihistamines as above  - Consider nasal saline rinses 1-2 times daily to remove allergens from the nasal cavities as well as help with mucous clearance (this is especially helpful to do before the nasal sprays are given) - Consider allergy shots as a means of long-term control. - Allergy shots "re-train" and "reset" the immune system to ignore environmental allergens and decrease the resulting immune response to those allergens (sneezing, itchy watery eyes, runny nose, nasal congestion, etc).    - Allergy shots improve symptoms in 75-85% of patients.  - We can discuss more at the next appointment if the medications are not working for you.  3. Recurrent infections - Please get the lab work that was ordered at your last office visit.  4. Mild persistent asthma, uncomplicated Continue Augmentin and prednisone as prescribed by urgent care on 04/24/21 - Daily controller medication(s): Continue Symbicort 80/4.5 2 puffs twice a day with spacer to help prevent cough and wheeze - Prior to physical activity: albuterol 2 puffs 10-15 minutes before physical activity. - Rescue medications: albuterol 4 puffs every 4-6 hours as needed - Asthma control goals:  * Full participation in all desired activities (may  need albuterol before activity) * Albuterol use two time or less a week on average (not counting use with activity) * Cough interfering with sleep two time or less a month * Oral steroids no more than once a year * No hospitalizations  5.Smoking Discussed stopping smoking and the benefits of stopping smoking  Please let us know if this treatment plan is not working well for you Schedule a follow-up appointment in  or sooner if needed

## 2021-04-25 ENCOUNTER — Ambulatory Visit: Payer: Medicaid Other | Admitting: Family

## 2021-04-25 ENCOUNTER — Telehealth (HOSPITAL_COMMUNITY): Payer: Self-pay | Admitting: Emergency Medicine

## 2021-04-25 MED ORDER — METRONIDAZOLE 0.75 % VA GEL: 1.0000 | Freq: Every day | VAGINAL | 0 refills | Status: AC

## 2021-04-25 MED ORDER — METRONIDAZOLE 500 MG PO TABS: 500.0000 mg | ORAL_TABLET | Freq: Two times a day (BID) | ORAL | 0 refills | Status: DC

## 2021-04-25 NOTE — Telephone Encounter (Signed)
Patient requesting gel, versus oral.  Due to recurrent history of BV, will add oral Flagyl to allergy list so patient gets her preferred medication in the future

## 2021-05-07 ENCOUNTER — Ambulatory Visit: Payer: Medicaid Other | Admitting: Internal Medicine

## 2021-05-07 NOTE — Progress Notes (Deleted)
Name: Peggy [name]  MRN/ DOB: [id number], [birth year]    Age/ Sex: 30 y.o., female     PCP: [facility]   Reason for Endocrinology Evaluation: Postablative Hypothyroidism     Initial Endocrinology Clinic Visit: 02/07/2021    PATIENT IDENTIFIER: Peggy [name] is a 30 y.o., female with a past medical history of postablative hypothyroidism, Asthma and utricaria  . She has followed with [facility] since 02/07/2021 for consultative assistance with management of her  Postablative Hypothyroidism  HISTORICAL SUMMARY:  She presented to Dr. Liliane Channel office in 08/2020 with hyperthyroidism secondary to Graves' disease.  Her thyroid uptake and scan on 01/20/2020 showed homogenous uptake through the thyroid with a 4-hr uptake at 27.5% ( 5-20%) and a 31 hr uptake of 46.5%   She is S/P RAI ablation on 02/01/2020 with 20 mCi of I-131  By 02/2020 she became hypothyroid with a TSH of 85.200 uIU/mL and was started on LT-4 replacement at the time    Has Nuvaring    Mother and maternal uncle with thyroid disease   SUBJECTIVE:    Today (05/07/2021):  Peggy [name] is here for postablative hypothyroidism.    HISTORY:  Past Medical History:  Past Medical History:  Diagnosis Date  . Abnormal Pap smear of cervix 06/22/2020   05/2020 pap LSIL negative HPV and GC/CHL  As per ASCCP guidelines repeat in 1 year, 5 year risk of CIN 3 + is 2%  . Asthma   . Gestational diabetes   . Pyloric stenosis   . Thyroid disease    Graves  . Urticaria     Past Surgical History:  Past Surgical History:  Procedure Laterality Date  . PYLOROMYOTOMY       Social History:  reports that she has been smoking cigarettes. She has a 1.25 pack-year smoking history. She has never used smokeless tobacco. She reports current alcohol use. She reports that she does not use drugs. Family History:  Family History  Problem Relation Age of Onset  . Arthritis Mother   . Cancer Mother         thyroid  . Depression Mother   . Hyperlipidemia Father   . Asthma Father   . Hypertension Father   . Heart disease Father   . COPD Maternal Grandfather   . Diabetes Paternal Grandmother   . Arthritis Paternal Grandmother   . Asthma Paternal Grandmother   . Heart disease Paternal Grandmother   . Depression Paternal Grandmother   . Hypertension Paternal Grandmother   . Hyperlipidemia Paternal Grandmother   . Other Neg Hx       HOME MEDICATIONS: Allergies as of 05/07/2021      Reactions   Metronidazole Nausea Only   Patient requests gel form for all future treatment of BV      Medication List       Accurate as of May 07, 2021 12:55 PM. If you have any questions, ask your nurse or doctor.        albuterol (2.5 MG/3ML) 0.083% nebulizer solution Commonly known as: PROVENTIL Take 3 mLs (2.5 mg total) by nebulization every 6 (six) hours as needed for wheezing or shortness of breath.   albuterol 108 (90 Base) MCG/ACT inhaler Commonly known as: ProAir HFA TAKE 2 PUFFS BY MOUTH EVERY 4- 6 HOURS AS NEEDED FOR WHEEZE OR SHORTNESS OF BREATH   amoxicillin-clavulanate 875-125 MG tablet Commonly known as: AUGMENTIN Take 1 tablet by mouth 2 (two) times daily.  budesonide-formoterol 80-4.5 MCG/ACT inhaler Commonly known as: Symbicort Use 2 puffs twice a day with spacer to help prevent cough and wheeze   cetirizine 10 MG tablet Commonly known as: ZYRTEC Take 2 tablets (20 mg total) by mouth 2 (two) times daily.   diphenhydrAMINE 25 MG tablet Commonly known as: BENADRYL Take 25 mg by mouth every 6 (six) hours as needed.   etonogestrel-ethinyl estradiol 0.12-0.015 MG/24HR vaginal ring Commonly known as: NUVARING Insert vaginally and leave in place for 3 consecutive weeks, then remove for 1 week.   famotidine 20 MG tablet Commonly known as: PEPCID TAKE 1 TABLET BY MOUTH TWICE A DAY   Flovent HFA 110 MCG/ACT inhaler Generic drug: fluticasone Inhale 2 puffs into the  lungs 2 (two) times daily.   levothyroxine 150 MCG tablet Commonly known as: SYNTHROID Take 1 tablet (150 mcg total) by mouth daily.   metroNIDAZOLE 500 MG tablet Commonly known as: FLAGYL Take 1 tablet (500 mg total) by mouth 2 (two) times daily.   phenazopyridine 200 MG tablet Commonly known as: PYRIDIUM Take 1 tablet (200 mg total) by mouth 3 (three) times daily.   predniSONE 20 MG tablet Commonly known as: DELTASONE Take 2 tablets (40 mg total) by mouth daily with breakfast.   promethazine-dextromethorphan 6.25-15 MG/5ML syrup Commonly known as: PROMETHAZINE-DM Take 5 mLs by mouth 3 (three) times daily as needed for cough.   Vitamin D-3 25 MCG (1000 UT) Caps Take 1 capsule (1,000 Units total) by mouth daily. Daily         OBJECTIVE:   PHYSICAL EXAM: VS: There were no vitals taken for this visit.   EXAM: General: Pt appears well and is in NAD  Neck: General: Supple without adenopathy. Thyroid: Thyroid size normal.  No goiter or nodules appreciated.   Lungs: Clear with good BS bilat with no rales, rhonchi, or wheezes  Heart: Auscultation: RRR.  Abdomen: Normoactive bowel sounds, soft, nontender, without masses or organomegaly palpable  Extremities:  BL LE: No pretibial edema normal ROM and strength.  Mental Status: Judgment, insight: Intact Orientation: Oriented to time, place, and person Memory: Intact for recent and remote events Mood and affect: No depression, anxiety, or agitation     DATA REVIEWED: ***    ASSESSMENT / PLAN / RECOMMENDATIONS:  1. Postablative Hypothyroidism :    - S/P RAI ablation 01/2020 - Pt with fatigue and weight gain  - No local neck symptoms  -- Pt educated extensively on the correct way to take levothyroxine (first thing in the morning with water, 30 minutes before eating or taking other medications). - Pt encouraged to double dose the following day if she were to miss a dose given long half-life of levothyroxine. - TSh  remains elevated , will adjust as below    Medications :  Start Levothyroxine 150 mcg daily      2. Graves's Disease:   - No extra thyroidal manifestations of graves' disease   3. Vitamin D insufficiency:   - Pt to start OTC Vitamin D3 1000 iu daily      Signed electronically by: Mack Guise, MD  [facility]  [facility] [address] [place], [place] [address] Phone: [phone] FAX: [phone]      CC: Practice, [place] [address] Phone: [phone]  Fax: [phone]   Return to Endocrinology clinic as below: Future Appointments  Date Time Provider [place]  05/07/2021  1:00 PM [place], Melanie Crazier,  MD [id number] None

## 2021-05-24 ENCOUNTER — Ambulatory Visit: Payer: Medicaid Other | Admitting: Advanced Practice Midwife

## 2021-05-27 DIAGNOSIS — R109 Unspecified abdominal pain: Secondary | ICD-10-CM | POA: Diagnosis not present

## 2021-05-27 DIAGNOSIS — M129 Arthropathy, unspecified: Secondary | ICD-10-CM | POA: Diagnosis not present

## 2021-06-18 ENCOUNTER — Encounter (HOSPITAL_COMMUNITY): Payer: Self-pay | Admitting: Emergency Medicine

## 2021-06-18 ENCOUNTER — Emergency Department (HOSPITAL_COMMUNITY)
Admission: EM | Admit: 2021-06-18 | Discharge: 2021-06-18 | Disposition: A | Discharge status: Home / Self Care | Payer: Medicaid Other | Attending: Emergency Medicine | Admitting: Emergency Medicine

## 2021-06-18 ENCOUNTER — Ambulatory Visit (INDEPENDENT_AMBULATORY_CARE_PROVIDER_SITE_OTHER): Payer: Medicaid Other | Admitting: Women's Health

## 2021-06-18 ENCOUNTER — Encounter: Payer: Self-pay | Admitting: Women's Health

## 2021-06-18 ENCOUNTER — Other Ambulatory Visit (HOSPITAL_COMMUNITY)
Admission: RE | Admit: 2021-06-18 | Discharge: 2021-06-18 | Disposition: A | Discharge status: Home / Self Care | Payer: Medicaid Other | Source: Ambulatory Visit | Attending: Obstetrics & Gynecology | Admitting: Obstetrics & Gynecology

## 2021-06-18 ENCOUNTER — Other Ambulatory Visit: Payer: Self-pay

## 2021-06-18 VITALS — BP 131/85 | HR 109 | Ht 60.5 in | Wt 155.0 lb

## 2021-06-18 DIAGNOSIS — N926 Irregular menstruation, unspecified: Secondary | ICD-10-CM

## 2021-06-18 DIAGNOSIS — M549 Dorsalgia, unspecified: Secondary | ICD-10-CM | POA: Insufficient documentation

## 2021-06-18 DIAGNOSIS — R519 Headache, unspecified: Secondary | ICD-10-CM | POA: Diagnosis not present

## 2021-06-18 DIAGNOSIS — Z5321 Procedure and treatment not carried out due to patient leaving prior to being seen by health care provider: Secondary | ICD-10-CM | POA: Diagnosis not present

## 2021-06-18 DIAGNOSIS — Z8742 Personal history of other diseases of the female genital tract: Secondary | ICD-10-CM

## 2021-06-18 DIAGNOSIS — Z0000 Encounter for general adult medical examination without abnormal findings: Secondary | ICD-10-CM

## 2021-06-18 DIAGNOSIS — R0981 Nasal congestion: Secondary | ICD-10-CM | POA: Diagnosis not present

## 2021-06-18 DIAGNOSIS — Z3202 Encounter for pregnancy test, result negative: Secondary | ICD-10-CM | POA: Diagnosis not present

## 2021-06-18 DIAGNOSIS — R002 Palpitations: Secondary | ICD-10-CM | POA: Insufficient documentation

## 2021-06-18 LAB — POCT URINE PREGNANCY: Preg Test, Ur: NEGATIVE

## 2021-06-18 NOTE — ED Triage Notes (Signed)
Pt states she has a thyroid disorder( Graves Disease) and that for 2 weeks she has felt like her heart is racing, that she feels nauseous and dizziness. Pt also states she woke up this morning with a severe headache, nasal congestion, and back pain.

## 2021-06-18 NOTE — Progress Notes (Signed)
WELL-WOMAN EXAMINATION Patient name: Peggy [name] MRN [id number]  Date of birth: [birth year] Chief Complaint:   Gynecologic Exam (Requests pregnancy test)  History of Present Illness:   Peggy [name] is a 30 y.o. [phone] Caucasian female being seen today for a routine well-woman exam.  Current complaints: irregular periods x38mths (twice last month, hasn't started yet this month) w/ low back pain and cramping x 2wks. Denies abnormal discharge, itching/odor/irritation.    PCP: [facility]      does not desire labs Patient's last menstrual period was 05/21/2021. The current method of family planning is NuvaRing vaginal inserts.  Last pap 06/16/20. Results were: LSIL w/ HRHPV negative. H/O abnormal pap: yes Last mammogram: never. Results were: N/A. Family h/o breast cancer: no Last colonoscopy: never. Results were: N/A. Family h/o colorectal cancer: no  Depression screen [facility] 2/9 06/18/2021 06/16/2020 10/21/2019 09/23/2018 11/23/2014  Decreased Interest 1 1 0 0 0  Down, Depressed, Hopeless 1 0 0 0 0  PHQ - 2 Score 2 1 0 0 0  Altered sleeping 1 1 - 3 -  Tired, decreased energy 1 1 - 3 -  Change in appetite 1 1 - 3 -  Feeling bad or failure about yourself  1 0 - 0 -  Trouble concentrating 1 1 - 2 -  Moving slowly or fidgety/restless 1 0 - 0 -  Suicidal thoughts 0 0 - 0 -  PHQ-9 Score 8 5 - 11 -  Difficult doing work/chores - Not difficult at all - - -  Some recent data might be hidden     GAD 7 : Generalized Anxiety Score 06/18/2021 06/16/2020  Nervous, Anxious, on Edge 1 1  Control/stop worrying 1 0  Worry too much - different things 1 0  Trouble relaxing 1 0  Restless 1 0  Easily annoyed or irritable 1 1  Afraid - awful might happen 0 0  Total GAD 7 Score 6 2  Anxiety Difficulty - Not difficult at all     Review of Systems:   Pertinent items are noted in HPI Denies any headaches, blurred vision, fatigue, shortness of breath, chest pain, abdominal pain, abnormal  vaginal discharge/itching/odor/irritation, problems with periods, bowel movements, urination, or intercourse unless otherwise stated above. Pertinent History Reviewed:  Reviewed past medical,surgical, social and family history.  Reviewed problem list, medications and allergies. Physical Assessment:   Vitals:   06/18/21 1548  BP: 131/85  Pulse: (!) 109  Weight: 155 lb (70.3 kg)  Height: 5' 0.5" (1.537 m)  Body mass index is 29.77 kg/m.        Physical Examination:   General appearance - well appearing, and in no distress  Mental status - alert, oriented to person, place, and time  Psych:  She has a normal mood and affect  Skin - warm and dry, normal color, no suspicious lesions noted  Chest - effort normal, all lung fields clear to auscultation bilaterally  Heart - normal rate and regular rhythm  Neck:  midline trachea, no thyromegaly or nodules  Breasts - breasts appear normal, no suspicious masses, no skin or nipple changes or  axillary nodes  Abdomen - soft, nontender, nondistended, no masses or organomegaly  Pelvic - VULVA: normal appearing vulva with no masses, tenderness or lesions  VAGINA: normal appearing vagina with normal color and discharge, no lesions  CERVIX: normal appearing cervix without discharge or lesions, no CMT  Thin prep pap is done w/ HR HPV cotesting  UTERUS: uterus is felt  to be normal size, shape, consistency and nontender   ADNEXA: No adnexal masses or tenderness noted.  Extremities:  No swelling or varicosities noted  Chaperone: Levy Pupa    Results for orders placed or performed in visit on 06/18/21 (from the past 24 hour(s))  POCT urine pregnancy   Collection Time: 06/18/21  3:54 PM  Result Value Ref Range   Preg Test, Ur Negative Negative    Assessment & Plan:  1) Well-Woman Exam  2) Irregular periods x38mths w/ cramping/low back pain x2wks>  will see if anything shows on pap (STD/vaginitis)   3) H/O abnormal pap> repeat  today  Labs/procedures today: pap  Mammogram: @ 28yo, or sooner if problems Colonoscopy: @ 28yo, or sooner if problems  Orders Placed This Encounter  Procedures   POCT urine pregnancy    Meds: No orders of the defined types were placed in this encounter.   Follow-up: Return in about 1 year (around 06/18/2022) for Physical.  [place], [facility] 06/18/2021 4:13 PM

## 2021-06-18 NOTE — ED Notes (Signed)
Pt stating she needs to leave because she does not want to wait this long, pt encouraged to stay but states she is to see her doctor tomorrow; pt seen leaving department in nad and steady gait

## 2021-06-19 DIAGNOSIS — Z6830 Body mass index (BMI) 30.0-30.9, adult: Secondary | ICD-10-CM | POA: Diagnosis not present

## 2021-06-19 DIAGNOSIS — R002 Palpitations: Secondary | ICD-10-CM | POA: Diagnosis not present

## 2021-06-19 DIAGNOSIS — E0500 Thyrotoxicosis with diffuse goiter without thyrotoxic crisis or storm: Secondary | ICD-10-CM | POA: Diagnosis not present

## 2021-06-19 DIAGNOSIS — U071 COVID-19: Secondary | ICD-10-CM | POA: Diagnosis not present

## 2021-06-19 DIAGNOSIS — Z20828 Contact with and (suspected) exposure to other viral communicable diseases: Secondary | ICD-10-CM | POA: Diagnosis not present

## 2021-06-19 DIAGNOSIS — R000 Tachycardia, unspecified: Secondary | ICD-10-CM | POA: Diagnosis not present

## 2021-06-19 DIAGNOSIS — R55 Syncope and collapse: Secondary | ICD-10-CM | POA: Diagnosis not present

## 2021-06-22 ENCOUNTER — Emergency Department (HOSPITAL_COMMUNITY): Payer: Medicaid Other

## 2021-06-22 ENCOUNTER — Encounter (HOSPITAL_COMMUNITY): Payer: Self-pay

## 2021-06-22 ENCOUNTER — Emergency Department (HOSPITAL_COMMUNITY)
Admission: EM | Admit: 2021-06-22 | Discharge: 2021-06-22 | Disposition: A | Discharge status: Home / Self Care | Payer: Medicaid Other | Attending: Emergency Medicine | Admitting: Emergency Medicine

## 2021-06-22 ENCOUNTER — Other Ambulatory Visit: Payer: Self-pay | Admitting: Allergy & Immunology

## 2021-06-22 ENCOUNTER — Other Ambulatory Visit: Payer: Self-pay

## 2021-06-22 DIAGNOSIS — Z79899 Other long term (current) drug therapy: Secondary | ICD-10-CM | POA: Insufficient documentation

## 2021-06-22 DIAGNOSIS — R600 Localized edema: Secondary | ICD-10-CM | POA: Insufficient documentation

## 2021-06-22 DIAGNOSIS — E039 Hypothyroidism, unspecified: Secondary | ICD-10-CM | POA: Diagnosis not present

## 2021-06-22 DIAGNOSIS — U071 COVID-19: Secondary | ICD-10-CM | POA: Diagnosis not present

## 2021-06-22 DIAGNOSIS — Z7951 Long term (current) use of inhaled steroids: Secondary | ICD-10-CM | POA: Diagnosis not present

## 2021-06-22 DIAGNOSIS — J4541 Moderate persistent asthma with (acute) exacerbation: Secondary | ICD-10-CM | POA: Insufficient documentation

## 2021-06-22 DIAGNOSIS — R062 Wheezing: Secondary | ICD-10-CM | POA: Diagnosis not present

## 2021-06-22 DIAGNOSIS — R0602 Shortness of breath: Secondary | ICD-10-CM | POA: Diagnosis not present

## 2021-06-22 DIAGNOSIS — F17210 Nicotine dependence, cigarettes, uncomplicated: Secondary | ICD-10-CM | POA: Insufficient documentation

## 2021-06-22 LAB — CBC WITH DIFFERENTIAL/PLATELET
Abs Immature Granulocytes: 0.03 10*3/uL (ref 0.00–0.07)
Basophils Absolute: 0.1 10*3/uL (ref 0.0–0.1)
Basophils Relative: 1 %
Eosinophils Absolute: 0.3 10*3/uL (ref 0.0–0.5)
Eosinophils Relative: 4 %
HCT: 40.9 % (ref 36.0–46.0)
Hemoglobin: 13.5 g/dL (ref 12.0–15.0)
Immature Granulocytes: 0 %
Lymphocytes Relative: 30 %
Lymphs Abs: 2.6 10*3/uL (ref 0.7–4.0)
MCH: 30.2 pg (ref 26.0–34.0)
MCHC: 33 g/dL (ref 30.0–36.0)
MCV: 91.5 fL (ref 80.0–100.0)
Monocytes Absolute: 0.3 10*3/uL (ref 0.1–1.0)
Monocytes Relative: 4 %
Neutro Abs: 5.2 10*3/uL (ref 1.7–7.7)
Neutrophils Relative %: 61 %
Platelets: 358 10*3/uL (ref 150–400)
RBC: 4.47 MIL/uL (ref 3.87–5.11)
RDW: 13.9 % (ref 11.5–15.5)
WBC: 8.5 10*3/uL (ref 4.0–10.5)
nRBC: 0 % (ref 0.0–0.2)

## 2021-06-22 LAB — COMPREHENSIVE METABOLIC PANEL
ALT: 17 U/L (ref 0–44)
AST: 20 U/L (ref 15–41)
Albumin: 4 g/dL (ref 3.5–5.0)
Alkaline Phosphatase: 52 U/L (ref 38–126)
Anion gap: 7 (ref 5–15)
BUN: 13 mg/dL (ref 6–20)
CO2: 22 mmol/L (ref 22–32)
Calcium: 8.7 mg/dL — ABNORMAL LOW (ref 8.9–10.3)
Chloride: 106 mmol/L (ref 98–111)
Creatinine, Ser: 0.74 mg/dL (ref 0.44–1.00)
GFR, Estimated: >60 mL/min (ref >60)
Glucose, Bld: 89 mg/dL (ref 70–99)
Potassium: 4.1 mmol/L (ref 3.5–5.1)
Sodium: 135 mmol/L (ref 135–145)
Total Bilirubin: 0.3 mg/dL (ref 0.3–1.2)
Total Protein: 7.7 g/dL (ref 6.5–8.1)

## 2021-06-22 LAB — I-STAT BETA HCG BLOOD, ED (MC, WL, AP ONLY): I-stat hCG, quantitative: <5 m[IU]/mL (ref <5)

## 2021-06-22 LAB — TROPONIN I (HIGH SENSITIVITY): Troponin I (High Sensitivity): <2 ng/L (ref <18)

## 2021-06-22 LAB — POC SARS CORONAVIRUS 2 AG -  ED: SARSCOV2ONAVIRUS 2 AG: NEGATIVE

## 2021-06-22 LAB — TSH: TSH: 25.226 u[IU]/mL — ABNORMAL HIGH (ref 0.350–4.500)

## 2021-06-22 LAB — D-DIMER, QUANTITATIVE: D-Dimer, Quant: 0.31 ug/mL-FEU (ref 0.00–0.50)

## 2021-06-22 MED ORDER — ALBUTEROL SULFATE (2.5 MG/3ML) 0.083% IN NEBU: 2.5000 mg | INHALATION_SOLUTION | Freq: Four times a day (QID) | RESPIRATORY_TRACT | 1 refills | Status: DC | PRN

## 2021-06-22 MED ORDER — MAGNESIUM SULFATE 2 GM/50ML IV SOLN
2.0000 g | Freq: Once | INTRAVENOUS | Status: AC
  Administered 2021-06-22: 2 g via INTRAVENOUS
  Filled 2021-06-22: qty 50

## 2021-06-22 MED ORDER — AEROCHAMBER PLUS FLO-VU MEDIUM MISC
1.0000 | Freq: Once | Status: AC
  Administered 2021-06-22: 1
  Filled 2021-06-22: qty 1

## 2021-06-22 MED ORDER — PREDNISONE 20 MG PO TABS: 40.0000 mg | ORAL_TABLET | Freq: Every day | ORAL | 0 refills | Status: AC

## 2021-06-22 MED ORDER — ALBUTEROL (5 MG/ML) CONTINUOUS INHALATION SOLN: 10.0000 mg/h | INHALATION_SOLUTION | Freq: Once | RESPIRATORY_TRACT | Status: DC

## 2021-06-22 MED ORDER — ALBUTEROL SULFATE HFA 108 (90 BASE) MCG/ACT IN AERS
6.0000 | INHALATION_SPRAY | Freq: Once | RESPIRATORY_TRACT | Status: AC
  Administered 2021-06-22: 6 via RESPIRATORY_TRACT
  Filled 2021-06-22: qty 6.7

## 2021-06-22 MED ORDER — ALBUTEROL SULFATE HFA 108 (90 BASE) MCG/ACT IN AERS: INHALATION_SPRAY | RESPIRATORY_TRACT | 1 refills | Status: DC

## 2021-06-22 MED ORDER — DEXAMETHASONE SODIUM PHOSPHATE 10 MG/ML IJ SOLN
10.0000 mg | Freq: Once | INTRAMUSCULAR | Status: AC
  Administered 2021-06-22: 10 mg via INTRAVENOUS
  Filled 2021-06-22: qty 1

## 2021-06-22 MED ORDER — IPRATROPIUM BROMIDE 0.02 % IN SOLN: 3.0000 mL | Freq: Once | RESPIRATORY_TRACT | Status: DC

## 2021-06-22 MED ORDER — NIRMATRELVIR/RITONAVIR (PAXLOVID)TABLET: 3.0000 | ORAL_TABLET | Freq: Two times a day (BID) | ORAL | 0 refills | Status: AC

## 2021-06-22 MED ORDER — IPRATROPIUM-ALBUTEROL 0.5-2.5 (3) MG/3ML IN SOLN: 3.0000 mL | Freq: Once | RESPIRATORY_TRACT | Status: DC

## 2021-06-22 NOTE — ED Provider Notes (Signed)
[facility] EMERGENCY DEPARTMENT Provider Note   CSN: [id number] Arrival date & time: 06/22/21  1215     History Chief Complaint  Patient presents with   covid positive    Peggy [name] is a 30 y.o. female with a past medical history of asthma, hypothyroid, chronic urticaria who presents today for evaluation of shortness of breath. She tested positive for COVID on Tuesday per her report.  She has had COVID before in the fall of last year, and has had 2 COVID vaccines with her second 1 in April of this year.  She states that she has had worsening shortness of breath, wheezing, feeling lightheaded and nauseous.  She states that she has been using her inhalers and nebulizers at home without relief.  She does report that she smokes normally however has not been smoking the past few days since her shortness of breath got worse.  Of note she also reports significant unilateral leg pain with pain in her left calf only.  She is on NuvaRing for birth control.  HPI     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   Chronic urticaria 10/12/2020   Recurrent infections 10/12/2020   Seasonal and perennial allergic rhinitis 10/12/2020   Mild persistent asthma, uncomplicated 36/14/4315   Abnormal Pap smear of cervix 06/22/2020   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/09/2020   Graves' disease 01/24/2020   Depression screening 05/25/2019   History of gestational diabetes 10/28/2018   Low vitamin D level 02/12/2017   Smoker 07/25/2014    Past Surgical History:  Procedure  Laterality Date   PYLOROMYOTOMY       OB History     Gravida  4   Para  4   Term  4   Preterm  0   AB  0   Living  4      SAB  0   IAB  0   Ectopic  0   Multiple  0   Live Births  4           Family History  Problem Relation Age of Onset   Arthritis Mother    Cancer Mother        thyroid   Depression Mother    Hyperlipidemia Father    Asthma Father    Hypertension Father    Heart disease Father    COPD Maternal Grandfather    Diabetes Paternal Grandmother    Arthritis Paternal Grandmother    Asthma Paternal Grandmother    Heart disease Paternal Grandmother    Depression Paternal Grandmother    Hypertension Paternal Grandmother    Hyperlipidemia Paternal Grandmother    Other Neg Hx     Social History   Tobacco Use   Smoking status: Every Day    Packs/day: 0.25    Years: 5.00    Pack years: 1.25    Types: Cigarettes   Smokeless tobacco: Never   Tobacco comments:    2 cig/day  Vaping  Use   Vaping Use: Never used  Substance Use Topics   Alcohol use: Yes    Comment: rarely   Drug use: No    Home Medications Prior to Admission medications   Medication Sig Start Date End Date Taking? Authorizing Provider  nirmatrelvir/ritonavir EUA (PAXLOVID) TABS Take 3 tablets by mouth 2 (two) times daily for 5 days. Take nirmatrelvir (150 mg) two tablets twice daily for 5 days and ritonavir (100 mg) one tablet twice daily for 5 days. Patient GFR is >60. 06/22/21 06/27/21 Yes Lorin Glass, PA-C  predniSONE (DELTASONE) 20 MG tablet Take 2 tablets (40 mg total) by mouth daily for 4 days. 06/22/21 06/26/21 Yes Lorin Glass, PA-C  albuterol [facility] HFA) 108 (90 Base) MCG/ACT inhaler TAKE 2 PUFFS BY MOUTH EVERY 4- 6 HOURS AS NEEDED FOR WHEEZE OR SHORTNESS OF BREATH 06/22/21   Lorin Glass, PA-C  albuterol (PROVENTIL) (2.5 MG/3ML) 0.083% nebulizer solution Take 3 mLs (2.5 mg total) by nebulization every 6 (six) hours as needed for wheezing or  shortness of breath. 06/22/21   Lorin Glass, PA-C  budesonide-formoterol [facility]) 80-4.5 MCG/ACT inhaler Use 2 puffs twice a day with spacer to help prevent cough and wheeze 03/28/21   Althea Charon, FNP  cetirizine (ZYRTEC) 10 MG tablet Take 2 tablets (20 mg total) by mouth 2 (two) times daily. Patient taking differently: Take 10 mg by mouth 2 (two) times daily. 10/11/20   Valentina Shaggy, MD  Cholecalciferol (VITAMIN D3 PO) Take by mouth.    [provider]  etonogestrel-ethinyl estradiol (NUVARING) 0.12-0.015 MG/24HR vaginal ring Insert vaginally and leave in place for 3 consecutive weeks, then remove for 1 week. 09/15/20   Roma Schanz, CNM  famotidine (PEPCID) 20 MG tablet TAKE 1 TABLET BY MOUTH TWICE A DAY 06/22/21   Valentina Shaggy, MD  levothyroxine (SYNTHROID) 150 MCG tablet Take 1 tablet (150 mcg total) by mouth daily. 02/08/21   Shamleffer, Melanie Crazier, MD    Allergies    Metronidazole  Review of Systems   Review of Systems  Constitutional:  Positive for chills and fatigue. Negative for fever.  Respiratory:  Positive for cough, chest tightness, shortness of breath and wheezing.   Cardiovascular:  Positive for palpitations and leg swelling (Left leg only).  Gastrointestinal:  Positive for nausea. Negative for abdominal pain.  Musculoskeletal:  Positive for arthralgias and myalgias.  Skin:  Negative for color change and rash.  Neurological:  Positive for weakness (Global) and headaches.  All other systems reviewed and are negative.  Physical Exam Updated Vital Signs BP 119/76   Pulse 84   Temp 98.9 F (37.2 C) (Oral)   Resp 15   Ht 5' (1.524 m)   Wt 70.3 kg   LMP 05/28/2021   SpO2 100%   BMI 30.27 kg/m   Physical Exam Vitals and nursing note reviewed.  Constitutional:      General: She is not in acute distress.    Appearance: She is not diaphoretic.  HENT:     Head: Normocephalic and atraumatic.  Eyes:     General: No  scleral icterus.       Right eye: No discharge.        Left eye: No discharge.     Conjunctiva/sclera: Conjunctivae normal.  Cardiovascular:     Rate and Rhythm: Normal rate and regular rhythm.  Pulmonary:     Effort: Respiratory distress (Mild) present.     Breath sounds: Wheezing present.  Comments: Patient has audible wheezing heard from the doorway upon entering her room. Abdominal:     General: There is no distension.  Musculoskeletal:        General: No deformity.     Cervical back: Normal range of motion.     Right lower leg: No edema.     Left lower leg: Edema present.  Skin:    General: Skin is warm and dry.  Neurological:     General: No focal deficit present.     Mental Status: She is alert.     Motor: No abnormal muscle tone.     Comments: Patient is awake and alert.  Answers questions appropriately.   Psychiatric:        Mood and Affect: Mood normal.        Behavior: Behavior normal.    ED Results / Procedures / Treatments   Labs (all labs ordered are listed, but only abnormal results are displayed) Labs Reviewed  COMPREHENSIVE METABOLIC PANEL - Abnormal; Notable for the following components:      Result Value   Calcium 8.7 (*)    All other components within normal limits  TSH - Abnormal; Notable for the following components:   TSH 25.226 (*)    All other components within normal limits  CBC WITH DIFFERENTIAL/PLATELET  D-DIMER, QUANTITATIVE  I-STAT BETA HCG BLOOD, ED ([facility], [facility], [facility] ONLY)  POC SARS CORONAVIRUS 2 AG -  ED  TROPONIN I (HIGH SENSITIVITY)    EKG EKG Interpretation  Date/Time:  Friday June 22 2021 13:37:37 EDT Ventricular Rate:  75 PR Interval:  146 QRS Duration: 74 QT Interval:  380 QTC Calculation: 425 R Axis:   74 Text Interpretation: Sinus rhythm Normal ECG No significant change since last tracing Confirmed by Calvert Cantor [phone]) on 06/22/2021 1:46:02 PM  Radiology DG Chest Portable 1 View  Result Date: 06/22/2021 CLINICAL  DATA:  Shortness of breath. COVID-19 positive. Wheezing. Smoker. EXAM: PORTABLE CHEST 1 VIEW COMPARISON:  08/27/2020 FINDINGS: Normal sized heart.  Clear lungs.  Unremarkable bones. IMPRESSION: No active disease. Electronically Signed   By: Claudie Revering M.D.   On: 06/22/2021 13:17    Procedures Procedures   Medications Ordered in ED Medications  ipratropium (ATROVENT) nebulizer solution 0.6 mg (0.6 mg Inhalation Not Given 06/22/21 1428)  dexamethasone (DECADRON) injection 10 mg (10 mg Intravenous Given 06/22/21 1325)  magnesium sulfate IVPB 2 g 50 mL (0 g Intravenous Stopped 06/22/21 1433)  albuterol (VENTOLIN HFA) 108 (90 Base) MCG/ACT inhaler 6 puff (6 puffs Inhalation Given 06/22/21 1328)  AeroChamber Plus Flo-Vu Medium MISC 1 each (1 each Other Given 06/22/21 1328)  albuterol (VENTOLIN HFA) 108 (90 Base) MCG/ACT inhaler 6 puff (6 puffs Inhalation Given 06/22/21 1422)    ED Course  I have reviewed the triage vital signs and the nursing notes.  Pertinent labs & imaging results that were available during my care of the patient were reviewed by me and considered in my medical decision making (see chart for details).  Clinical Course as of 06/22/21 2054  Fri Jun 22, 2021  1409 I was informed that as patient has covid they will not do neb treatments.   [EH]  1448 TSH(!): 25.226 Improved from prior.   [EH]  1526 Patient is reevaluated, she feels much better. [EH]  1601 POC SARS Coronavirus 2 Ag-ED - Nasal Swab (BD Veritor Kit) Patient showed me positive PCR, will treat based on that.  [EH]    Clinical Course User Index [  EH] Ollen Gross   MDM Rules/Calculators/A&P                         Is a 30 year old woman who presents today for evaluation of wheezing.  She shows me a positive PCR COVID test from within the past 5 days and her symptoms started under 5 days ago.  He has chronic asthma for which she has prescriptions for daily steroid inhalers and had significant wheezing  audible from the doorway on my exam.  She is 99 to 100% on room air.  CBC and CMP are unremarkable.  She did report mild leg swelling, D-dimer and troponin are both not elevated, pregnancy test is negative.  She requested her TSH be checked, her TSH is still significantly high at 25 however 4 months ago it was over 50 and this appears to be a significant improvement.  I recommended that she follow-up with her endocrinologist regarding this. Her chest x-ray does not show consolidation or other acute abnormality. She is treated with multiple rounds of albuterol inhaler, given Atrovent, IV steroids, IV magnesium in addition.  EKG without ischemia.  Given her persistent lung disease severe enough for her to be prescribed daily steroids, combined with her degree of her wheezing on arrival that was audible from the doorway I feel that it is appropriate for her to be treated with Paxil did as she appears to be at high risk for severe progression to hospitalization or death and is within 5 days of symptom onset.  Her GFR is over 60 therefore she does not require dose adjustments.  She is given a prescription for this.  Through online system I confirmed that her preferred pharmacy should have this.  We discussed risks and benefits of this medicine and she states her understanding.  Medicine that she takes it appears to interact with Paxil that is estrogen and she is instructed to use backup protection.  After treatment, patient's wheezing had improved significantly and she is agreeable for discharge home stating that she feels better.  She is given ongoing steroids recent home, along with refills on her albuterol inhaler and nebulizers.  Peggy [name] was evaluated in Emergency Department on 06/22/2021 for the symptoms described in the history of present illness. She was evaluated in the context of the global COVID-19 pandemic, which necessitated consideration that the patient might be at risk for infection  with the SARS-CoV-2 virus that causes COVID-19. Institutional protocols and algorithms that pertain to the evaluation of patients at risk for COVID-19 are in a state of rapid change based on information released by regulatory bodies including the CDC and federal and state organizations. These policies and algorithms were followed during the patient's care in the ED.  Return precautions were discussed with patient who states their understanding.  At the time of discharge patient denied any unaddressed complaints or concerns.  Patient is agreeable for discharge home.  Note: Portions of this report may have been transcribed using voice recognition software. Every effort was made to ensure accuracy; however, inadvertent computerized transcription errors may be present  Final Clinical Impression(s) / ED Diagnoses Final diagnoses:  COVID-19  Moderate persistent asthma with acute exacerbation    Rx / DC Orders ED Discharge Orders          Ordered    nirmatrelvir/ritonavir EUA (PAXLOVID) TABS  2 times daily        06/22/21 1613    albuterol (PROAIR HFA)  108 (90 Base) MCG/ACT inhaler        06/22/21 1615    albuterol (PROVENTIL) (2.5 MG/3ML) 0.083% nebulizer solution  Every 6 hours PRN        06/22/21 1615    predniSONE (DELTASONE) 20 MG tablet  Daily        06/22/21 1615             Ollen Gross 06/22/21 2059    Truddie Hidden, MD 06/23/21 [phone]

## 2021-06-22 NOTE — Discharge Instructions (Addendum)
Today have given you a prescription for Paxil bid.  This is a oral medicine for COVID.  Please start this today. This can interact with your birth control and make it less effective.  Please use backup protection for at least the next 2 cycles.  Please see if you can get a pulse oxygen sensor.  If your oxygen is consistently below 88% on room air you need to return to the emergency room. If your shortness of breath becomes severe or you have other concerns please return to the emergency room.  I have given you refills on your albuterol. You may use either your inhaler or your nebulizer every 2 hours as needed while you are recovering from [place]. The steroids that I gave you today should last in your system for a few days however I have given you a prescription for continued steroids at home.

## 2021-06-22 NOTE — ED Triage Notes (Addendum)
Pt reports tested positive for covid Tuesday.  C/O sob, wheezing, lightheaded, dizziness, and nauseated.  Reports slight cough.  Also c/o sore throat.  Denies chest pain.  Has been using her inhalers and neb treatments at home without relief.

## 2021-06-25 ENCOUNTER — Telehealth: Payer: Self-pay

## 2021-06-25 LAB — CYTOLOGY - PAP
Chlamydia: NEGATIVE
Comment: NEGATIVE
Comment: NEGATIVE
Comment: NORMAL
Diagnosis: NEGATIVE
Diagnosis: REACTIVE
High risk HPV: NEGATIVE
Neisseria Gonorrhea: NEGATIVE

## 2021-06-25 NOTE — Telephone Encounter (Signed)
Transition Care Management Unsuccessful Follow-up Telephone Call  Date of discharge and from where:  06/22/2021 from [facility]  Attempts:  1st Attempt  Reason for unsuccessful TCM follow-up call:  Left voice message

## 2021-06-26 NOTE — Telephone Encounter (Signed)
Transition Care Management Unsuccessful Follow-up Telephone Call  Date of discharge and from where:  06/22/2021 from [facility]  Attempts:  2nd Attempt  Reason for unsuccessful TCM follow-up call:  Left voice message

## 2021-06-27 NOTE — Telephone Encounter (Signed)
Transition Care Management Unsuccessful Follow-up Telephone Call  Date of discharge and from where:  06/22/2021 from [facility]  Attempts:  3rd Attempt  Reason for unsuccessful TCM follow-up call:  Unable to reach patient

## 2021-07-03 ENCOUNTER — Other Ambulatory Visit: Payer: Self-pay | Admitting: Women's Health

## 2021-07-03 DIAGNOSIS — R102 Pelvic and perineal pain: Secondary | ICD-10-CM

## 2021-07-03 DIAGNOSIS — N926 Irregular menstruation, unspecified: Secondary | ICD-10-CM

## 2021-07-06 ENCOUNTER — Ambulatory Visit (HOSPITAL_COMMUNITY): Admission: RE | Admit: 2021-07-06 | Payer: Medicaid Other | Source: Ambulatory Visit

## 2021-07-13 ENCOUNTER — Ambulatory Visit (HOSPITAL_COMMUNITY)
Admission: RE | Admit: 2021-07-13 | Discharge: 2021-07-13 | Disposition: A | Discharge status: Home / Self Care | Payer: Medicaid Other | Source: Ambulatory Visit | Attending: Women's Health | Admitting: Women's Health

## 2021-07-13 ENCOUNTER — Other Ambulatory Visit: Payer: Self-pay

## 2021-07-13 ENCOUNTER — Other Ambulatory Visit: Payer: Self-pay | Admitting: Women's Health

## 2021-07-13 DIAGNOSIS — N926 Irregular menstruation, unspecified: Secondary | ICD-10-CM | POA: Diagnosis not present

## 2021-07-13 DIAGNOSIS — R102 Pelvic and perineal pain: Secondary | ICD-10-CM | POA: Diagnosis not present

## 2021-07-13 DIAGNOSIS — N939 Abnormal uterine and vaginal bleeding, unspecified: Secondary | ICD-10-CM | POA: Diagnosis not present

## 2021-07-27 ENCOUNTER — Other Ambulatory Visit: Payer: Self-pay | Admitting: Adult Health

## 2021-07-31 ENCOUNTER — Telehealth: Payer: Self-pay | Admitting: Women's Health

## 2021-07-31 ENCOUNTER — Other Ambulatory Visit: Payer: Self-pay | Admitting: Women's Health

## 2021-07-31 MED ORDER — MEDROXYPROGESTERONE ACETATE 150 MG/ML IM SUSP: 150.0000 mg | INTRAMUSCULAR | 3 refills | Status: DC

## 2021-07-31 NOTE — Telephone Encounter (Signed)
[facility] - need to schedule 1st depo injection per Manus Rudd

## 2021-08-01 ENCOUNTER — Encounter: Payer: Self-pay | Admitting: *Deleted

## 2021-08-02 ENCOUNTER — Ambulatory Visit (INDEPENDENT_AMBULATORY_CARE_PROVIDER_SITE_OTHER): Payer: Medicaid Other | Admitting: Cardiology

## 2021-08-02 ENCOUNTER — Other Ambulatory Visit: Payer: Self-pay

## 2021-08-02 ENCOUNTER — Telehealth: Payer: Self-pay | Admitting: Cardiology

## 2021-08-02 ENCOUNTER — Other Ambulatory Visit: Payer: Self-pay | Admitting: Cardiology

## 2021-08-02 ENCOUNTER — Ambulatory Visit (INDEPENDENT_AMBULATORY_CARE_PROVIDER_SITE_OTHER): Payer: Medicaid Other

## 2021-08-02 ENCOUNTER — Encounter: Payer: Self-pay | Admitting: Cardiology

## 2021-08-02 ENCOUNTER — Encounter: Payer: Self-pay | Admitting: *Deleted

## 2021-08-02 VITALS — BP 138/86 | HR 109 | Ht 60.0 in | Wt 155.0 lb

## 2021-08-02 DIAGNOSIS — R002 Palpitations: Secondary | ICD-10-CM | POA: Diagnosis not present

## 2021-08-02 DIAGNOSIS — R000 Tachycardia, unspecified: Secondary | ICD-10-CM | POA: Diagnosis not present

## 2021-08-02 NOTE — Progress Notes (Signed)
Clinical Summary Peggy [name] is a 30 y.o.female seen today as a new consult, referred by PA [facility] for the following medical problems.   1.Palpitations - can occur at rest, feels like heart beating out of chest. Lightheaded, dizzy. Lasts a few minutes.Occurs few times a day - rare coffee, no sodas, no energy drinks, no EtOH - symptoms ongoing 2 years  -TSH 17.2, free T4 1.08 - 05/2021 neg pregnancy test    2. History of Graves Disease - s/p radiation - on synthroid.   Has 4 kids ages 49,6,3,2  Past Medical History:  Diagnosis Date   Abnormal Pap smear of cervix 06/22/2020   05/2020 pap LSIL negative HPV and GC/CHL  As per ASCCP guidelines repeat in 1 year, 5 year risk of CIN 3 + is 2%   Asthma    Gestational diabetes    Pyloric stenosis    Thyroid disease    Graves   Urticaria      Allergies  Allergen Reactions   Metronidazole Nausea Only    Patient requests gel form for all future treatment of BV     Current Outpatient Medications  Medication Sig Dispense Refill   albuterol (PROAIR HFA) 108 (90 Base) MCG/ACT inhaler TAKE 2 PUFFS BY MOUTH EVERY 4- 6 HOURS AS NEEDED FOR WHEEZE OR SHORTNESS OF BREATH 1 each 1   albuterol (PROVENTIL) (2.5 MG/3ML) 0.083% nebulizer solution Take 3 mLs (2.5 mg total) by nebulization every 6 (six) hours as needed for wheezing or shortness of breath. 75 mL 1   budesonide-formoterol (SYMBICORT) 80-4.5 MCG/ACT inhaler Use 2 puffs twice a day with spacer to help prevent cough and wheeze 1 each 3   cetirizine (ZYRTEC) 10 MG tablet Take 2 tablets (20 mg total) by mouth 2 (two) times daily. (Patient taking differently: Take 10 mg by mouth 2 (two) times daily.) 120 tablet 5   Cholecalciferol (VITAMIN D3 PO) Take by mouth.     famotidine (PEPCID) 20 MG tablet TAKE 1 TABLET BY MOUTH TWICE A DAY 60 tablet 2   levothyroxine (SYNTHROID) 150 MCG tablet Take 1 tablet (150 mcg total) by mouth daily. 30 tablet 6   medroxyPROGESTERone (DEPO-PROVERA) 150  MG/ML injection Inject 1 mL (150 mg total) into the muscle every 3 (three) months. 1 mL 3   No current facility-administered medications for this visit.     Past Surgical History:  Procedure Laterality Date   PYLOROMYOTOMY       Allergies  Allergen Reactions   Metronidazole Nausea Only    Patient requests gel form for all future treatment of BV      Family History  Problem Relation Age of Onset   Arthritis Mother    Cancer Mother        thyroid   Depression Mother    Hyperlipidemia Father    Asthma Father    Hypertension Father    Heart disease Father    COPD Maternal Grandfather    Diabetes Paternal Grandmother    Arthritis Paternal Grandmother    Asthma Paternal Grandmother    Heart disease Paternal Grandmother    Depression Paternal Grandmother    Hypertension Paternal Grandmother    Hyperlipidemia Paternal Grandmother    Other Neg Hx      Social History Peggy [name] reports that she has been smoking cigarettes. She has a 1.25 pack-year smoking history. She has never used smokeless tobacco. Peggy [name] reports current alcohol use.   Review of Systems CONSTITUTIONAL: No weight  loss, fever, chills, weakness or fatigue.  HEENT: Eyes: No visual loss, blurred vision, double vision or yellow sclerae.No hearing loss, sneezing, congestion, runny nose or sore throat.  SKIN: No rash or itching.  CARDIOVASCULAR: per hpi RESPIRATORY: No shortness of breath, cough or sputum.  GASTROINTESTINAL: No anorexia, nausea, vomiting or diarrhea. No abdominal pain or blood.  GENITOURINARY: No burning on urination, no polyuria NEUROLOGICAL: No headache, dizziness, syncope, paralysis, ataxia, numbness or tingling in the extremities. No change in bowel or bladder control.  MUSCULOSKELETAL: No muscle, back pain, joint pain or stiffness.  LYMPHATICS: No enlarged nodes. No history of splenectomy.  PSYCHIATRIC: No history of depression or anxiety.  ENDOCRINOLOGIC: No reports of  sweating, cold or heat intolerance. No polyuria or polydipsia.  Marland Kitchen   Physical Examination Today's Vitals   08/02/21 1459 08/02/21 1501  BP: 140/84 138/86  Pulse: (!) 109   SpO2: 97%   Weight: 155 lb (70.3 kg)   Height: 5' (1.524 m)    Body mass index is 30.27 kg/m.  Gen: resting comfortably, no acute distress HEENT: no scleral icterus, pupils equal round and reactive, no palptable cervical adenopathy,  CV: RRR, no m/r/g no jvd Resp: Clear to auscultation bilaterally GI: abdomen is soft, non-tender, non-distended, normal bowel sounds, no hepatosplenomegaly MSK: extremities are warm, no edema.  Skin: warm, no rash Neuro:  no focal deficits Psych: appropriate affect    Assessment and Plan  Palpitations -EKG today shows sinus tachycardia that appears chronic for her - will plan for 7 day zio patch to further evaluate potential cardiac arrhythmia      Arnoldo Lenis, M.D.

## 2021-08-02 NOTE — Telephone Encounter (Signed)
PERCERT:  7 day zio - palps

## 2021-08-02 NOTE — Patient Instructions (Addendum)
Medication Instructions:  Continue all current medications.  Labwork: none  Testing/Procedures:  Your physician has recommended that you wear a 7 day event monitor. Event monitors are medical devices that record the heart's electrical activity. Doctors most often us these monitors to diagnose arrhythmias. Arrhythmias are problems with the speed or rhythm of the heartbeat. The monitor is a small, portable device. You can wear one while you do your normal daily activities. This is usually used to diagnose what is causing palpitations/syncope (passing out).  Office will contact with results via phone or letter.    Follow-Up: 6 weeks   Any Other Special Instructions Will Be Listed Below (If Applicable).  If you need a refill on your cardiac medications before your next appointment, please call your pharmacy.  

## 2021-08-03 DIAGNOSIS — E039 Hypothyroidism, unspecified: Secondary | ICD-10-CM | POA: Diagnosis not present

## 2021-08-06 ENCOUNTER — Ambulatory Visit: Payer: Medicaid Other

## 2021-08-06 ENCOUNTER — Telehealth: Payer: Self-pay

## 2021-08-06 DIAGNOSIS — R002 Palpitations: Secondary | ICD-10-CM

## 2021-08-06 NOTE — Telephone Encounter (Signed)
Received referral on pt- she is already established [place]. Closed Referral

## 2021-08-08 ENCOUNTER — Ambulatory Visit (INDEPENDENT_AMBULATORY_CARE_PROVIDER_SITE_OTHER): Payer: Medicaid Other | Admitting: *Deleted

## 2021-08-08 ENCOUNTER — Other Ambulatory Visit: Payer: Self-pay

## 2021-08-08 DIAGNOSIS — Z3202 Encounter for pregnancy test, result negative: Secondary | ICD-10-CM | POA: Diagnosis not present

## 2021-08-08 DIAGNOSIS — Z3042 Encounter for surveillance of injectable contraceptive: Secondary | ICD-10-CM

## 2021-08-08 LAB — POCT URINE PREGNANCY: Preg Test, Ur: NEGATIVE

## 2021-08-08 MED ORDER — MEDROXYPROGESTERONE ACETATE 150 MG/ML IM SUSP
150.0000 mg | Freq: Once | INTRAMUSCULAR | Status: AC
  Administered 2021-08-08: 150 mg via INTRAMUSCULAR

## 2021-08-08 NOTE — Progress Notes (Signed)
   NURSE VISIT- INJECTION  SUBJECTIVE:  Peggy [name] is a 30 y.o. [phone] female here for a Depo Provera for contraception/period management. She is a GYN patient.   OBJECTIVE:  There were no vitals taken for this visit.  Appears well, in no apparent distress  Injection administered in: Left deltoid  Meds ordered this encounter  Medications   medroxyPROGESTERone (DEPO-PROVERA) injection 150 mg    ASSESSMENT: GYN patient Depo Provera for contraception/period management PLAN: Follow-up: in 11-13 weeks for next Depo   Peggy [name]  08/08/2021 3:32 PM

## 2021-08-28 ENCOUNTER — Other Ambulatory Visit (HOSPITAL_COMMUNITY): Payer: Self-pay | Admitting: Neurology

## 2021-08-28 ENCOUNTER — Other Ambulatory Visit: Payer: Self-pay | Admitting: Neurology

## 2021-08-28 DIAGNOSIS — R413 Other amnesia: Secondary | ICD-10-CM | POA: Diagnosis not present

## 2021-08-28 DIAGNOSIS — R519 Headache, unspecified: Secondary | ICD-10-CM

## 2021-08-28 DIAGNOSIS — H538 Other visual disturbances: Secondary | ICD-10-CM | POA: Diagnosis not present

## 2021-08-28 DIAGNOSIS — R42 Dizziness and giddiness: Secondary | ICD-10-CM | POA: Diagnosis not present

## 2021-09-07 ENCOUNTER — Ambulatory Visit (HOSPITAL_COMMUNITY): Payer: Medicaid Other

## 2021-09-07 ENCOUNTER — Telehealth: Payer: Self-pay | Admitting: Cardiology

## 2021-09-07 ENCOUNTER — Encounter (HOSPITAL_COMMUNITY): Payer: Self-pay

## 2021-09-07 NOTE — Telephone Encounter (Signed)
New message     Patient calling for results for monitor she mailed it back 3 weeks ago

## 2021-09-07 NOTE — Telephone Encounter (Signed)
Pt informed monitor has not been resulted yet and we will call her when we have the results.

## 2021-09-13 ENCOUNTER — Encounter: Payer: Medicaid Other | Admitting: Family Medicine

## 2021-09-14 ENCOUNTER — Ambulatory Visit (HOSPITAL_COMMUNITY): Payer: Medicaid Other

## 2021-09-14 ENCOUNTER — Ambulatory Visit (HOSPITAL_COMMUNITY): Admission: RE | Admit: 2021-09-14 | Payer: Medicaid Other | Source: Ambulatory Visit

## 2021-09-17 ENCOUNTER — Other Ambulatory Visit: Payer: Self-pay

## 2021-09-17 ENCOUNTER — Encounter (HOSPITAL_COMMUNITY): Payer: Self-pay | Admitting: *Deleted

## 2021-09-17 ENCOUNTER — Emergency Department (HOSPITAL_COMMUNITY)
Admission: EM | Admit: 2021-09-17 | Discharge: 2021-09-17 | Disposition: A | Discharge status: Home / Self Care | Payer: Medicaid Other | Attending: Emergency Medicine | Admitting: Emergency Medicine

## 2021-09-17 ENCOUNTER — Telehealth: Payer: Self-pay | Admitting: Cardiology

## 2021-09-17 DIAGNOSIS — F17210 Nicotine dependence, cigarettes, uncomplicated: Secondary | ICD-10-CM | POA: Diagnosis not present

## 2021-09-17 DIAGNOSIS — E039 Hypothyroidism, unspecified: Secondary | ICD-10-CM | POA: Insufficient documentation

## 2021-09-17 DIAGNOSIS — Z79899 Other long term (current) drug therapy: Secondary | ICD-10-CM | POA: Insufficient documentation

## 2021-09-17 DIAGNOSIS — H9209 Otalgia, unspecified ear: Secondary | ICD-10-CM | POA: Insufficient documentation

## 2021-09-17 DIAGNOSIS — Z7951 Long term (current) use of inhaled steroids: Secondary | ICD-10-CM | POA: Diagnosis not present

## 2021-09-17 DIAGNOSIS — R519 Headache, unspecified: Secondary | ICD-10-CM | POA: Diagnosis not present

## 2021-09-17 DIAGNOSIS — J4530 Mild persistent asthma, uncomplicated: Secondary | ICD-10-CM | POA: Diagnosis not present

## 2021-09-17 DIAGNOSIS — R110 Nausea: Secondary | ICD-10-CM | POA: Insufficient documentation

## 2021-09-17 MED ORDER — SODIUM CHLORIDE 0.9 % IV BOLUS
1000.0000 mL | Freq: Once | INTRAVENOUS | Status: AC
  Administered 2021-09-17: 1000 mL via INTRAVENOUS

## 2021-09-17 MED ORDER — KETOROLAC TROMETHAMINE 30 MG/ML IJ SOLN
30.0000 mg | Freq: Once | INTRAMUSCULAR | Status: AC
  Administered 2021-09-17: 30 mg via INTRAVENOUS
  Filled 2021-09-17: qty 1

## 2021-09-17 MED ORDER — METOCLOPRAMIDE HCL 5 MG/ML IJ SOLN
10.0000 mg | Freq: Once | INTRAMUSCULAR | Status: AC
  Administered 2021-09-17: 10 mg via INTRAVENOUS
  Filled 2021-09-17: qty 2

## 2021-09-17 MED ORDER — DIPHENHYDRAMINE HCL 50 MG/ML IJ SOLN
25.0000 mg | Freq: Once | INTRAMUSCULAR | Status: AC
  Administered 2021-09-17: 25 mg via INTRAVENOUS
  Filled 2021-09-17: qty 1

## 2021-09-17 NOTE — Telephone Encounter (Signed)
Patient 2nd call for monitor results

## 2021-09-17 NOTE — ED Provider Notes (Signed)
[facility] Provider Note   CSN: [id number] Arrival date & time: 09/17/21  1256     History Chief Complaint  Patient presents with   Nausea    Peggy [name] is a 30 y.o. female.  Pt reports she has headaches.  She is not currently on and medications    The history is provided by the patient. No language interpreter was used.  Headache Pain location:  Generalized Quality:  Unable to specify Radiates to:  Does not radiate Severity currently:  3/10 Severity at highest:  3/10 Onset quality:  Gradual Duration:  1 day Timing:  Constant Progression:  Unchanged Chronicity:  Recurrent Context: not activity   Relieved by:  Nothing Worsened by:  Nothing Ineffective treatments:  None tried Associated symptoms: ear pain       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   Chronic urticaria 10/12/2020   Recurrent infections 10/12/2020   Seasonal and perennial allergic rhinitis 10/12/2020   Mild persistent asthma, uncomplicated [id number]   Abnormal Pap smear of cervix 06/22/2020   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/09/2020   Graves' disease 01/24/2020   Depression screening 05/25/2019   History of gestational diabetes 10/28/2018   Low vitamin D level 02/12/2017   Smoker 07/25/2014    Past Surgical History:  Procedure Laterality Date   PYLOROMYOTOMY       OB History     Gravida  4   Para  4   Term  4   Preterm  0   AB  0   Living  4      SAB  0   IAB  0   Ectopic  0   Multiple  0   Live Births  4            Family History  Problem Relation Age of Onset   Arthritis Mother    Cancer Mother        thyroid   Depression Mother    Hyperlipidemia Father    Asthma Father    Hypertension Father    Heart disease Father    COPD Maternal Grandfather    Diabetes Paternal Grandmother    Arthritis Paternal Grandmother    Asthma Paternal Grandmother    Heart disease Paternal Grandmother    Depression Paternal Grandmother    Hypertension Paternal Grandmother    Hyperlipidemia Paternal Grandmother    Other Neg Hx     Social History   Tobacco Use   Smoking status: Every Day    Packs/day: 0.25    Years: 5.00    Pack years: 1.25    Types: Cigarettes   Smokeless tobacco: Never   Tobacco comments:    2 cig/day  Vaping Use   Vaping Use: Never used  Substance Use Topics   Alcohol use: Yes    Comment: rarely   Drug use: No    Home Medications Prior to Admission medications   Medication Sig Start Date End Date Taking?  Authorizing Provider  albuterol (PROAIR HFA) 108 (90 Base) MCG/ACT inhaler TAKE 2 PUFFS BY MOUTH EVERY 4- 6 HOURS AS NEEDED FOR WHEEZE OR SHORTNESS OF BREATH 06/22/21   Lorin Glass, PA-C  albuterol (PROVENTIL) (2.5 MG/3ML) 0.083% nebulizer solution Take 3 mLs (2.5 mg total) by nebulization every 6 (six) hours as needed for wheezing or shortness of breath. 06/22/21   Lorin Glass, PA-C  budesonide-formoterol [facility]) 80-4.5 MCG/ACT inhaler Use 2 puffs twice a day with spacer to help prevent cough and wheeze 03/28/21   Althea Charon, FNP  cetirizine (ZYRTEC) 10 MG tablet Take 2 tablets (20 mg total) by mouth 2 (two) times daily. Patient taking differently: Take 10 mg by mouth 2 (two) times daily. 10/11/20   Valentina Shaggy, MD  Cholecalciferol (VITAMIN D3 PO) Take by mouth.    [provider]  famotidine (PEPCID) 20 MG tablet TAKE 1 TABLET BY MOUTH TWICE A DAY 06/22/21   Valentina Shaggy, MD  levothyroxine (SYNTHROID) 150 MCG tablet  Take 1 tablet (150 mcg total) by mouth daily. 02/08/21   Shamleffer, Melanie Crazier, MD  medroxyPROGESTERone (DEPO-PROVERA) 150 MG/ML injection Inject 1 mL (150 mg total) into the muscle every 3 (three) months. 07/31/21   Roma Schanz, CNM    Allergies    Metronidazole  Review of Systems   Review of Systems  HENT:  Positive for ear pain.   Neurological:  Positive for headaches.  All other systems reviewed and are negative.  Physical Exam Updated Vital Signs BP 121/70 (BP Location: Left Arm)   Pulse 78   Temp 98.5 F (36.9 C)   Resp 16   Ht 5' (1.524 m)   Wt 70.1 kg   LMP 08/23/2021 (Exact Date)   SpO2 100%   BMI 30.18 kg/m   Physical Exam Vitals reviewed.  Constitutional:      Appearance: Normal appearance.  HENT:     Head: Normocephalic.  Cardiovascular:     Rate and Rhythm: Normal rate and regular rhythm.  Pulmonary:     Effort: Pulmonary effort is normal.  Abdominal:     General: Abdomen is flat.  Musculoskeletal:        General: Normal range of motion.     Cervical back: Normal range of motion.  Skin:    General: Skin is warm.  Neurological:     General: No focal deficit present.     Mental Status: She is alert.  Psychiatric:        Mood and Affect: Mood normal.    ED Results / Procedures / Treatments   Labs (all labs ordered are listed, but only abnormal results are displayed) Labs Reviewed - No data to display  EKG None  Radiology No results found.  Procedures Procedures   Medications Ordered in ED Medications  sodium chloride 0.9 % bolus 1,000 mL (0 mLs Intravenous Stopped 09/17/21 1717)  metoCLOPramide (REGLAN) injection 10 mg (10 mg Intravenous Given 09/17/21 1631)  diphenhydrAMINE (BENADRYL) injection 25 mg (25 mg Intravenous Given 09/17/21 1635)  ketorolac (TORADOL) 30 MG/ML injection 30 mg (30 mg Intravenous Given 09/17/21 1638)    ED Course  I have reviewed the triage vital signs and the nursing notes.  Pertinent labs & imaging  results that were available during my care of the patient were reviewed by me and considered in my medical decision making (see chart for details).    MDM Rules/Calculators/A&P  MDM: Pt given toradol, benadryl and reglan.  Pt advised to follow up with her MD for recheck   Final Clinical Impression(s) / ED Diagnoses Final diagnoses:  Nausea  Intractable headache, unspecified chronicity pattern, unspecified headache type    Rx / DC Orders ED Discharge Orders     None     An After Visit Summary was printed and given to the patient.    Sidney Ace 09/17/21 2259    Luna Fuse, MD 10/03/21 [id number]

## 2021-09-17 NOTE — ED Triage Notes (Signed)
Nausea, blurred vision, can't thinks straight onset today, ringing in both ears

## 2021-09-17 NOTE — Discharge Instructions (Signed)
See your Physician as scheduled for evaluation

## 2021-09-17 NOTE — ED Triage Notes (Signed)
Symptoms are not new and states she is being followed by neurology

## 2021-09-17 NOTE — Telephone Encounter (Signed)
Patient notified via detailed voice mail - will forward to provider as results are on provider desktop awaiting his final disposition.

## 2021-09-18 ENCOUNTER — Telehealth: Payer: Self-pay

## 2021-09-18 DIAGNOSIS — Z20828 Contact with and (suspected) exposure to other viral communicable diseases: Secondary | ICD-10-CM | POA: Diagnosis not present

## 2021-09-18 NOTE — Telephone Encounter (Signed)
Transition Care Management Unsuccessful Follow-up Telephone Call  Date of discharge and from where:  09/17/2021-[place]  Attempts:  1st Attempt  Reason for unsuccessful TCM follow-up call:  Left voice message

## 2021-09-19 NOTE — Telephone Encounter (Signed)
Peggy [name] is hesitant to take BB because she was told not to take with thyroid medication. She wants affirmation from [facility] first.

## 2021-09-19 NOTE — Telephone Encounter (Signed)
Monitor overall looks fine. Some rare extra heart beats, high heart rates at times but normal heart rhythms meaning there is no significant issue with abnormal heart rhythms causing her symptoms. If ongoing symptoms could try lopressor 12.5mg  bid, if she were planning on any future pregnancies would need to come off this medication, but often it can help relieve some of the symptoms. Needs to reschedule her f/u with Jonni Sanger I believe she missed   Zandra Abts MD

## 2021-09-19 NOTE — Telephone Encounter (Signed)
Left message to return call 

## 2021-09-20 ENCOUNTER — Other Ambulatory Visit: Payer: Self-pay

## 2021-09-20 ENCOUNTER — Ambulatory Visit
Admission: EM | Admit: 2021-09-20 | Discharge: 2021-09-20 | Disposition: A | Discharge status: Home / Self Care | Payer: Medicaid Other | Attending: Emergency Medicine | Admitting: Emergency Medicine

## 2021-09-20 DIAGNOSIS — R0981 Nasal congestion: Secondary | ICD-10-CM

## 2021-09-20 DIAGNOSIS — Z1152 Encounter for screening for COVID-19: Secondary | ICD-10-CM | POA: Diagnosis not present

## 2021-09-20 DIAGNOSIS — R112 Nausea with vomiting, unspecified: Secondary | ICD-10-CM

## 2021-09-20 DIAGNOSIS — R221 Localized swelling, mass and lump, neck: Secondary | ICD-10-CM

## 2021-09-20 LAB — POCT URINALYSIS DIP (MANUAL ENTRY)
Bilirubin, UA: NEGATIVE
Blood, UA: NEGATIVE
Glucose, UA: NEGATIVE mg/dL
Leukocytes, UA: NEGATIVE
Nitrite, UA: NEGATIVE
Protein Ur, POC: NEGATIVE mg/dL
Spec Grav, UA: 1.02 (ref 1.010–1.025)
Urobilinogen, UA: 0.2 E.U./dL
pH, UA: 8.5 — AB (ref 5.0–8.0)

## 2021-09-20 LAB — POCT URINE PREGNANCY: Preg Test, Ur: NEGATIVE

## 2021-09-20 MED ORDER — ONDANSETRON HCL 4 MG/2ML IJ SOLN
4.0000 mg | Freq: Once | INTRAMUSCULAR | Status: AC
  Administered 2021-09-20: 4 mg via INTRAMUSCULAR

## 2021-09-20 MED ORDER — DEXAMETHASONE SODIUM PHOSPHATE 10 MG/ML IJ SOLN
10.0000 mg | Freq: Once | INTRAMUSCULAR | Status: AC
  Administered 2021-09-20: 10 mg via INTRAMUSCULAR

## 2021-09-20 MED ORDER — ONDANSETRON HCL 4 MG PO TABS: 4.0000 mg | ORAL_TABLET | Freq: Four times a day (QID) | ORAL | 0 refills | Status: DC

## 2021-09-20 NOTE — Telephone Encounter (Signed)
Transition Care Management Unsuccessful Follow-up Telephone Call  Date of discharge and from where:  09/17/2021 from [facility]  Attempts:  2nd Attempt  Reason for unsuccessful TCM follow-up call:  Left voice message

## 2021-09-20 NOTE — ED Provider Notes (Signed)
[facility]   [id number] 09/20/21 Arrival Time: 0092   CC: COVID symptoms  SUBJECTIVE: History from: patient.  Peggy [name] is a 30 y.o. female who presents with persistent nausea, one episode of vomiting, congestion, ear pain, throat swelling and hives x few days.  Denies sick exposure to COVID, flu or strep.  Has tried anti-nausea medication a few days ago with relief.  Denies aggravating factors.   Denies previous symptoms in the past.   Denies fever, chills, SOB, wheezing, chest pain, changes in bowel or bladder habits.    ROS: As per HPI.  All other pertinent ROS negative.     Past Medical History:  Diagnosis Date   Abnormal Pap smear of cervix 06/22/2020   05/2020 pap LSIL negative HPV and GC/CHL  As per ASCCP guidelines repeat in 1 year, 5 year risk of CIN 3 + is 2%   Asthma    Gestational diabetes    Pyloric stenosis    Thyroid disease    Graves   Urticaria    Past Surgical History:  Procedure Laterality Date   PYLOROMYOTOMY     Allergies  Allergen Reactions   Metronidazole Nausea Only    Patient requests gel form for all future treatment of BV   No current facility-administered medications on file prior to encounter.   Current Outpatient Medications on File Prior to Encounter  Medication Sig Dispense Refill   albuterol (PROAIR HFA) 108 (90 Base) MCG/ACT inhaler TAKE 2 PUFFS BY MOUTH EVERY 4- 6 HOURS AS NEEDED FOR WHEEZE OR SHORTNESS OF BREATH 1 each 1   albuterol (PROVENTIL) (2.5 MG/3ML) 0.083% nebulizer solution Take 3 mLs (2.5 mg total) by nebulization every 6 (six) hours as needed for wheezing or shortness of breath. 75 mL 1   budesonide-formoterol (SYMBICORT) 80-4.5 MCG/ACT inhaler Use 2 puffs twice a day with spacer to help prevent cough and wheeze 1 each 3   cetirizine (ZYRTEC) 10 MG tablet Take 2 tablets (20 mg total) by mouth 2 (two) times daily. (Patient taking differently: Take 10 mg by mouth 2 (two) times daily.) 120 tablet 5    Cholecalciferol (VITAMIN D3 PO) Take by mouth.     famotidine (PEPCID) 20 MG tablet TAKE 1 TABLET BY MOUTH TWICE A DAY 60 tablet 2   levothyroxine (SYNTHROID) 150 MCG tablet Take 1 tablet (150 mcg total) by mouth daily. 30 tablet 6   medroxyPROGESTERone (DEPO-PROVERA) 150 MG/ML injection Inject 1 mL (150 mg total) into the muscle every 3 (three) months. 1 mL 3   Social History   Socioeconomic History   Marital status: Divorced    Spouse name: Akeia Perot   Number of children: 2   Years of education: Not on file   Highest education level: Some college, no degree  Occupational History   Not on file  Tobacco Use   Smoking status: Every Day    Packs/day: 0.25    Years: 5.00    Pack years: 1.25    Types: Cigarettes   Smokeless tobacco: Never   Tobacco comments:    2 cig/day  Vaping Use   Vaping Use: Never used  Substance and Sexual Activity   Alcohol use: Yes    Comment: rarely   Drug use: No   Sexual activity: Yes    Birth control/protection: Inserts  Other Topics Concern   Not on file  Social History Narrative   Not on file   Social Determinants of Health   Financial Resource Strain: Low  Risk    Difficulty of Paying Living Expenses: Not hard at all  Food Insecurity: No Food Insecurity   Worried About [place] in the Last Year: Never true   Ran Out of Food in the Last Year: Never true  Transportation Needs: No Transportation Needs   Lack of Transportation (Medical): No   Lack of Transportation (Non-Medical): No  Physical Activity: Sufficiently Active   Days of Exercise per Week: 5 days   Minutes of Exercise per Session: 60 min  Stress: Stress Concern Present   Feeling of Stress : Very much  Social Connections: Moderately Isolated   Frequency of Communication with Friends and Family: Twice a week   Frequency of Social Gatherings with Friends and Family: Once a week   Attends Religious Services: 1 to 4 times per year   Active Member of [employer] or  Organizations: No   Attends Music therapist: Never   Marital Status: Divorced  Human resources officer Violence: Not At Risk   Fear of Current or Ex-Partner: No   Emotionally Abused: No   Physically Abused: No   Sexually Abused: No   Family History  Problem Relation Age of Onset   Arthritis Mother    Cancer Mother        thyroid   Depression Mother    Hyperlipidemia Father    Asthma Father    Hypertension Father    Heart disease Father    COPD Maternal Grandfather    Diabetes Paternal Grandmother    Arthritis Paternal Grandmother    Asthma Paternal Grandmother    Heart disease Paternal Grandmother    Depression Paternal Grandmother    Hypertension Paternal Grandmother    Hyperlipidemia Paternal Grandmother    Other Neg Hx     OBJECTIVE:  Vitals:   09/20/21 1017  BP: 134/86  Pulse: 86  Resp: 18  Temp: 99.1 F (37.3 C)  SpO2: 96%     General appearance: alert; appears fatigued, but nontoxic; speaking in full sentences and tolerating own secretions HEENT: NCAT; Ears: EACs clear, TMs pearly gray; Eyes: PERRL.  EOM grossly intact. Sinuses: nontender; Nose: nares patent without rhinorrhea, Throat: oropharynx clear, tonsils non erythematous or enlarged, uvula midline  Neck: supple without LAD Lungs: unlabored respirations, symmetrical air entry; cough: absent; no respiratory distress; CTAB Heart: regular rate and rhythm.   Abdomen: soft, nondistended, normal active bowel sounds; nontender to palpation; no guarding  Skin: warm and dry Psychological: alert and cooperative; normal mood and affect  LABS:  Results for orders placed or performed during the hospital encounter of 09/20/21 (from the past 24 hour(s))  POCT urinalysis dipstick     Status: Abnormal   Collection Time: 09/20/21 10:30 AM  Result Value Ref Range   Color, UA yellow yellow   Clarity, UA clear clear   Glucose, UA negative negative mg/dL   Bilirubin, UA negative negative   Ketones, POC UA  moderate (40) (A) negative mg/dL   Spec Grav, UA 1.020 1.010 - 1.025   Blood, UA negative negative   pH, UA 8.5 (A) 5.0 - 8.0   Protein Ur, POC negative negative mg/dL   Urobilinogen, UA 0.2 0.2 or 1.0 E.U./dL   Nitrite, UA Negative Negative   Leukocytes, UA Negative Negative  POCT urine pregnancy     Status: None   Collection Time: 09/20/21 10:30 AM  Result Value Ref Range   Preg Test, Ur Negative Negative     ASSESSMENT & PLAN:  1. Encounter  for screening for COVID-19   2. Non-intractable vomiting with nausea, unspecified vomiting type   3. Throat swelling   4. Sinus congestion     Meds ordered this encounter  Medications   ondansetron (ZOFRAN) 4 MG tablet    Sig: Take 1 tablet (4 mg total) by mouth every 6 (six) hours.    Dispense:  12 tablet    Refill:  0    Order Specific Question:   Supervising Provider    Answer:   Raylene Everts [[id number]]   dexamethasone (DECADRON) injection 10 mg   ondansetron (ZOFRAN) injection 4 mg   Urine pregnancy negative COVID testing ordered.  It will take between 5-7 days for test results.  Someone will contact you regarding abnormal results.    In the meantime: You should remain isolated in your home for 5 days from symptom onset AND greater than 72 hours after symptoms resolution (absence of fever without the use of fever-reducing medication and improvement in respiratory symptoms), whichever is longer Get plenty of rest and push fluids Steroid and zofran shots given in office Zofran prescribed Use OTC zyrtec for nasal congestion, runny nose, and/or sore throat Use OTC flonase for nasal congestion and runny nose Use medications daily for symptom relief Use OTC medications like ibuprofen or tylenol as needed fever or pain Call or go to the ED if you have any new or worsening symptoms such as fever, cough, shortness of breath, chest tightness, chest pain, turning blue, changes in mental status, etc...   Reviewed expectations re:  course of current medical issues. Questions answered. Outlined signs and symptoms indicating need for more acute intervention. Patient verbalized understanding. After Visit Summary given.          Juna, Caban, PA-C 09/20/21 1043

## 2021-09-20 NOTE — ED Triage Notes (Signed)
Pt presents with c/o continued nasuea that she was seen in ed for, is now c/o throat swelling and hives for past couple of days

## 2021-09-20 NOTE — Discharge Instructions (Signed)
Urine pregnancy negative COVID testing ordered.  It will take between 5-7 days for test results.  Someone will contact you regarding abnormal results.    In the meantime: You should remain isolated in your home for 5 days from symptom onset AND greater than 72 hours after symptoms resolution (absence of fever without the use of fever-reducing medication and improvement in respiratory symptoms), whichever is longer Get plenty of rest and push fluids Steroid and zofran shots given in office Zofran prescribed Use OTC zyrtec for nasal congestion, runny nose, and/or sore throat Use OTC flonase for nasal congestion and runny nose Use medications daily for symptom relief Use OTC medications like ibuprofen or tylenol as needed fever or pain Call or go to the ED if you have any new or worsening symptoms such as fever, cough, shortness of breath, chest tightness, chest pain, turning blue, changes in mental status, etc..Marland Kitchen

## 2021-09-20 NOTE — Telephone Encounter (Signed)
There is no absolute contraindication to taking beta blocker if someone is on thyroid medication. Is she having a lot of low thyroid symptoms? (Ie cold intolerance, low energy, weight gain, fatigue?) Her last TSH was elevated (indicating low thyroid). If a lot of low thyroid symptoms small possibility beta blocker could increase these and we could consider diltiazem 30mg  bid instead   Zandra Abts MD

## 2021-09-21 LAB — NOVEL CORONAVIRUS, NAA: SARS-CoV-2, NAA: NOT DETECTED

## 2021-09-21 LAB — SARS-COV-2, NAA 2 DAY TAT

## 2021-09-21 NOTE — Telephone Encounter (Signed)
I spoke with patient and relayed Dr.Branch's message. She is going to start BB.

## 2021-09-23 NOTE — Telephone Encounter (Signed)
Transition Care Management Unsuccessful Follow-up Telephone Call  Date of discharge and from where:  09/17/2021-[place]  Attempts:  3rd Attempt  Reason for unsuccessful TCM follow-up call:  Left voice message

## 2021-10-04 ENCOUNTER — Other Ambulatory Visit: Payer: Self-pay

## 2021-10-04 ENCOUNTER — Ambulatory Visit (HOSPITAL_COMMUNITY)
Admission: RE | Admit: 2021-10-04 | Discharge: 2021-10-04 | Disposition: A | Discharge status: Home / Self Care | Payer: Medicaid Other | Source: Ambulatory Visit | Attending: Neurology | Admitting: Neurology

## 2021-10-04 DIAGNOSIS — H538 Other visual disturbances: Secondary | ICD-10-CM

## 2021-10-04 DIAGNOSIS — R42 Dizziness and giddiness: Secondary | ICD-10-CM

## 2021-10-04 DIAGNOSIS — G9389 Other specified disorders of brain: Secondary | ICD-10-CM | POA: Diagnosis not present

## 2021-10-04 DIAGNOSIS — J3489 Other specified disorders of nose and nasal sinuses: Secondary | ICD-10-CM | POA: Diagnosis not present

## 2021-10-04 DIAGNOSIS — R519 Headache, unspecified: Secondary | ICD-10-CM

## 2021-10-08 ENCOUNTER — Ambulatory Visit: Payer: Medicaid Other | Admitting: Women's Health

## 2021-10-15 ENCOUNTER — Other Ambulatory Visit: Payer: Self-pay

## 2021-10-15 ENCOUNTER — Emergency Department (HOSPITAL_COMMUNITY): Payer: Medicaid Other

## 2021-10-15 ENCOUNTER — Encounter (HOSPITAL_COMMUNITY): Payer: Self-pay | Admitting: Emergency Medicine

## 2021-10-15 ENCOUNTER — Emergency Department (HOSPITAL_COMMUNITY)
Admission: EM | Admit: 2021-10-15 | Discharge: 2021-10-15 | Disposition: A | Discharge status: Home / Self Care | Payer: Medicaid Other | Attending: Emergency Medicine | Admitting: Emergency Medicine

## 2021-10-15 ENCOUNTER — Telehealth: Payer: Self-pay

## 2021-10-15 DIAGNOSIS — J4520 Mild intermittent asthma, uncomplicated: Secondary | ICD-10-CM | POA: Insufficient documentation

## 2021-10-15 DIAGNOSIS — Z79899 Other long term (current) drug therapy: Secondary | ICD-10-CM | POA: Insufficient documentation

## 2021-10-15 DIAGNOSIS — F17210 Nicotine dependence, cigarettes, uncomplicated: Secondary | ICD-10-CM | POA: Diagnosis not present

## 2021-10-15 DIAGNOSIS — E039 Hypothyroidism, unspecified: Secondary | ICD-10-CM | POA: Diagnosis not present

## 2021-10-15 DIAGNOSIS — R509 Fever, unspecified: Secondary | ICD-10-CM | POA: Diagnosis not present

## 2021-10-15 DIAGNOSIS — E0500 Thyrotoxicosis with diffuse goiter without thyrotoxic crisis or storm: Secondary | ICD-10-CM | POA: Diagnosis not present

## 2021-10-15 DIAGNOSIS — J029 Acute pharyngitis, unspecified: Secondary | ICD-10-CM

## 2021-10-15 LAB — BASIC METABOLIC PANEL
Anion gap: 6 (ref 5–15)
BUN: 15 mg/dL (ref 6–20)
CO2: 27 mmol/L (ref 22–32)
Calcium: 9 mg/dL (ref 8.9–10.3)
Chloride: 105 mmol/L (ref 98–111)
Creatinine, Ser: 0.76 mg/dL (ref 0.44–1.00)
GFR, Estimated: >60 mL/min (ref >60)
Glucose, Bld: 105 mg/dL — ABNORMAL HIGH (ref 70–99)
Potassium: 4 mmol/L (ref 3.5–5.1)
Sodium: 138 mmol/L (ref 135–145)

## 2021-10-15 LAB — CBC WITH DIFFERENTIAL/PLATELET
Abs Immature Granulocytes: 0.07 10*3/uL (ref 0.00–0.07)
Basophils Absolute: 0.1 10*3/uL (ref 0.0–0.1)
Basophils Relative: 1 %
Eosinophils Absolute: 0.7 10*3/uL — ABNORMAL HIGH (ref 0.0–0.5)
Eosinophils Relative: 6 %
HCT: 41 % (ref 36.0–46.0)
Hemoglobin: 13.1 g/dL (ref 12.0–15.0)
Immature Granulocytes: 1 %
Lymphocytes Relative: 22 %
Lymphs Abs: 2.6 10*3/uL (ref 0.7–4.0)
MCH: 29.7 pg (ref 26.0–34.0)
MCHC: 32 g/dL (ref 30.0–36.0)
MCV: 93 fL (ref 80.0–100.0)
Monocytes Absolute: 0.6 10*3/uL (ref 0.1–1.0)
Monocytes Relative: 5 %
Neutro Abs: 7.4 10*3/uL (ref 1.7–7.7)
Neutrophils Relative %: 65 %
Platelets: 337 10*3/uL (ref 150–400)
RBC: 4.41 MIL/uL (ref 3.87–5.11)
RDW: 13.9 % (ref 11.5–15.5)
WBC: 11.4 10*3/uL — ABNORMAL HIGH (ref 4.0–10.5)
nRBC: 0 % (ref 0.0–0.2)

## 2021-10-15 LAB — TSH: TSH: 81.573 u[IU]/mL — ABNORMAL HIGH (ref 0.350–4.500)

## 2021-10-15 LAB — T4, FREE: Free T4: 0.49 ng/dL — ABNORMAL LOW (ref 0.61–1.12)

## 2021-10-15 LAB — HCG, SERUM, QUALITATIVE: Preg, Serum: NEGATIVE

## 2021-10-15 MED ORDER — DEXAMETHASONE SODIUM PHOSPHATE 10 MG/ML IJ SOLN
10.0000 mg | Freq: Once | INTRAMUSCULAR | Status: AC
  Administered 2021-10-15: 10 mg
  Filled 2021-10-15: qty 1

## 2021-10-15 MED ORDER — METOPROLOL TARTRATE 25 MG PO TABS: 12.5000 mg | ORAL_TABLET | Freq: Two times a day (BID) | ORAL | 1 refills | Status: DC

## 2021-10-15 NOTE — ED Provider Notes (Signed)
[facility] EMERGENCY DEPARTMENT Provider Note   CSN: [id number] Arrival date & time: 10/15/21  0725     History Chief Complaint  Patient presents with   Oral Swelling    Throat    Peggy [name] is a 30 y.o. female.  This is a 30 y.o. female with significant medical history as below, including graves disease who presents to the ED with complaint of throat discomfort  Location:  throat Duration:  3-4 wks Onset:  gradual Timing:  constant Description:  throat scratchiness, tightness  Severity:  mild Exacerbating/Alleviating Factors:  unable to identify Associated Symptoms:  congestion, rhinorrhea, post nasal drip Pertinent Negatives:  no dysphagia or dysphonia. No fevers chills n/v/d. No cough. No pain with neck movement. No recent change to thyroid medications. No sick contacts or recent travel.   Went to UC 3 wks ago and was given a steroid shot, did not alleviate symptoms. No other medications prior to arrival.   The history is provided by the patient. No language interpreter was used.  Sore Throat This is a new problem. The current episode started more than 1 week ago. The problem has not changed since onset.Pertinent negatives include no chest pain, no abdominal pain, no headaches and no shortness of breath. Nothing aggravates the symptoms. Nothing relieves the symptoms. She has tried nothing for the symptoms.      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   Chronic urticaria 10/12/2020   Recurrent infections 10/12/2020   Seasonal and perennial allergic  rhinitis 10/12/2020   Mild persistent asthma, uncomplicated 95/62/1308   Abnormal Pap smear of cervix 06/22/2020   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/09/2020   Graves' disease 01/24/2020   Depression screening 05/25/2019   History of gestational diabetes 10/28/2018   Low vitamin D level 02/12/2017   Smoker 07/25/2014    Past Surgical History:  Procedure Laterality Date   PYLOROMYOTOMY       OB History     Gravida  4   Para  4   Term  4   Preterm  0   AB  0   Living  4      SAB  0   IAB  0   Ectopic  0   Multiple  0   Live Births  4           Family History  Problem Relation Age of Onset   Arthritis Mother    Cancer Mother        thyroid   Depression Mother    Hyperlipidemia Father    Asthma Father    Hypertension Father    Heart disease Father    COPD Maternal Grandfather    Diabetes Paternal Grandmother    Arthritis Paternal Grandmother    Asthma Paternal Grandmother    Heart disease Paternal Grandmother    Depression Paternal Grandmother    Hypertension Paternal Grandmother    Hyperlipidemia Paternal Grandmother    Other Neg Hx  Social History   Tobacco Use   Smoking status: Every Day    Packs/day: 0.25    Years: 5.00    Pack years: 1.25    Types: Cigarettes   Smokeless tobacco: Never   Tobacco comments:    2 cig/day  Vaping Use   Vaping Use: Never used  Substance Use Topics   Alcohol use: Yes    Comment: rarely   Drug use: No    Home Medications Prior to Admission medications   Medication Sig Start Date End Date Taking? Authorizing Provider  albuterol (PROAIR HFA) 108 (90 Base) MCG/ACT inhaler TAKE 2 PUFFS BY MOUTH EVERY 4- 6 HOURS AS NEEDED FOR WHEEZE OR SHORTNESS OF BREATH 06/22/21  Yes Lorin Glass, PA-C  albuterol (PROVENTIL) (2.5 MG/3ML) 0.083% nebulizer solution Take 3 mLs (2.5 mg total) by nebulization every 6 (six) hours as needed for wheezing or shortness of breath. 06/22/21  Yes Lorin Glass, PA-C  budesonide-formoterol [facility]) 80-4.5 MCG/ACT inhaler Use 2 puffs twice a day with spacer to help prevent cough and wheeze 03/28/21  Yes Althea Charon, FNP  cetirizine (ZYRTEC) 10 MG tablet Take 2 tablets (20 mg total) by mouth 2 (two) times daily. Patient taking differently: Take 10 mg by mouth 2 (two) times daily. 10/11/20  Yes Valentina Shaggy, MD  Cholecalciferol (VITAMIN D3 PO) Take by mouth.   Yes [provider]  famotidine (PEPCID) 20 MG tablet TAKE 1 TABLET BY MOUTH TWICE A DAY 06/22/21  Yes Valentina Shaggy, MD  levothyroxine (SYNTHROID) 175 MCG tablet Take 175 mcg by mouth every morning. 07/30/21  Yes [provider]  medroxyPROGESTERone (DEPO-PROVERA) 150 MG/ML injection Inject 1 mL (150 mg total) into the muscle every 3 (three) months. 07/31/21  Yes Roma Schanz, CNM  ondansetron (ZOFRAN) 4 MG tablet Take 1 tablet (4 mg total) by mouth every 6 (six) hours. 09/20/21  Yes Wurst, [place], PA-C    Allergies    Metronidazole  Review of Systems   Review of Systems  Constitutional:  Negative for chills and fever.  HENT:  Positive for congestion, postnasal drip, rhinorrhea and sore throat. Negative for facial swelling and trouble swallowing.   Eyes:  Negative for photophobia and visual disturbance.  Respiratory:  Negative for cough and shortness of breath.   Cardiovascular:  Negative for chest pain and palpitations.  Gastrointestinal:  Negative for abdominal pain, nausea and vomiting.  Endocrine: Negative for polydipsia and polyuria.  Genitourinary:  Negative for difficulty urinating and hematuria.  Musculoskeletal:  Negative for gait problem and joint swelling.  Skin:  Negative for pallor and rash.  Neurological:  Negative for syncope and headaches.  Psychiatric/Behavioral:  Negative for agitation and confusion.    Physical Exam Updated Vital Signs BP 114/78   Pulse 70   Temp 98.3 F (36.8 C)   Resp 18   LMP 08/23/2021    SpO2 97%   Physical Exam Vitals and nursing note reviewed.  Constitutional:      General: She is not in acute distress.    Appearance: Normal appearance.  HENT:     Head: Normocephalic and atraumatic.     Right Ear: External ear normal.     Left Ear: External ear normal.     Nose: Nose normal.     Mouth/Throat:     Mouth: Mucous membranes are moist.     Dentition: Normal dentition.     Tongue: No lesions.     Pharynx: Oropharynx is clear. Uvula  midline. No pharyngeal swelling, oropharyngeal exudate, posterior oropharyngeal erythema or uvula swelling.     Comments: No evidence of deep space infection, no drooling, no trismus, no stridor. No jaw claudication. No dental abscess.  Eyes:     General: No scleral icterus.       Right eye: No discharge.        Left eye: No discharge.     Extraocular Movements: Extraocular movements intact.     Pupils: Pupils are equal, round, and reactive to light.  Cardiovascular:     Rate and Rhythm: Normal rate and regular rhythm.     Pulses: Normal pulses.     Heart sounds: Normal heart sounds.  Pulmonary:     Effort: Pulmonary effort is normal. No respiratory distress.     Breath sounds: Normal breath sounds.  Abdominal:     General: Abdomen is flat.     Tenderness: There is no abdominal tenderness.  Musculoskeletal:        General: Normal range of motion.     Cervical back: Normal range of motion.     Right lower leg: No edema.     Left lower leg: No edema.  Skin:    General: Skin is warm and dry.     Capillary Refill: Capillary refill takes less than 2 seconds.  Neurological:     Mental Status: She is alert.  Psychiatric:        Mood and Affect: Mood normal.        Behavior: Behavior normal.    ED Results / Procedures / Treatments   Labs (all labs ordered are listed, but only abnormal results are displayed) Labs Reviewed  BASIC METABOLIC PANEL - Abnormal; Notable for the following components:      Result Value   Glucose, Bld 105  (*)    All other components within normal limits  CBC WITH DIFFERENTIAL/PLATELET - Abnormal; Notable for the following components:   WBC 11.4 (*)    Eosinophils Absolute 0.7 (*)    All other components within normal limits  TSH - Abnormal; Notable for the following components:   TSH 81.573 (*)    All other components within normal limits  HCG, SERUM, QUALITATIVE  T4, FREE    EKG None  Radiology DG Neck Soft Tissue  Result Date: 10/15/2021 CLINICAL DATA:  Dysphagia, sore throat EXAM: NECK SOFT TISSUES - 1+ VIEW COMPARISON:  None. FINDINGS: The prevertebral soft tissues are normal. The epiglottis is normal. The airway is patent. There is no radiopaque foreign body. The bones are normal.  The lung apices are clear. IMPRESSION: Normal radiographs of the neck Electronically Signed   By: Valetta Mole M.D.   On: 10/15/2021 08:12    Procedures Procedures   Medications Ordered in ED Medications  dexamethasone (DECADRON) injection 10 mg (10 mg Other Given 10/15/21 2956)    ED Course  I have reviewed the triage vital signs and the nursing notes.  Pertinent labs & imaging results that were available during my care of the patient were reviewed by me and considered in my medical decision making (see chart for details).    MDM Rules/Calculators/A&P                         This patient complains of throat pain; this involves an extensive number of treatment options and is a complaint that carries with it a high risk of complications and Morbidity. Vital signs reviewed and are stable.  Serious etiologies considered.   Low suspicion for deep space infection on exam.  No trismus, no drooling, no stridor.  Oropharynx is clear.  She has history of Graves' disease.  Soft tissue neck x-ray nonacute.  I ordered, reviewed and interpreted labs  I ordered medication decadron  I ordered imaging studies which included soft tissue xr neck and I independently visualized and interpreted imaging which  showed no acute findings  Previous records obtained and reviewed   Pt with ongoing sore throat x3-4 weeks. She has history of graves disease. She follows with PCP regarding thyroid medications. She has seen endo in the past but does not follow regularly with them. She is protecting her airway and tolerating PO appropriately. Her throat abnormality may be due to ongoing grave's disease and may be candidate for radiation therapy at endocrinology's discretion. No emergent indication for sore throat at this time.  The patient improved significantly and was discharged in stable condition. Detailed discussions were had with the patient regarding current findings, and need for close f/u with PCP or on call doctor. The patient has been instructed to return immediately if the symptoms worsen in any way for re-evaluation. Patient verbalized understanding and is in agreement with current care plan. All questions answered prior to discharge.     Final Clinical Impression(s) / ED Diagnoses Final diagnoses:  Sore throat  Graves disease    Rx / DC Orders ED Discharge Orders     None        Jeanell Sparrow, DO 10/15/21 1020

## 2021-10-15 NOTE — ED Triage Notes (Signed)
Pt reports throat swelling x 3 weeks. Pt reports hx of radiation to her thyroid due to graves disease. Denies SHOB.

## 2021-10-15 NOTE — Discharge Instructions (Addendum)
Please follow up with PCP regarding thyroid levels. I would recommend following up with endocrinologist regarding ongoing Grave's disease

## 2021-10-15 NOTE — Telephone Encounter (Signed)
E-scribed to [employer], lopressor 12.5 mg BID, #90, RF:1   Messaged patient via Pharmacist, community

## 2021-10-16 ENCOUNTER — Telehealth: Payer: Self-pay

## 2021-10-16 NOTE — Telephone Encounter (Signed)
Transition Care Management Follow-up Telephone Call Date of discharge and from where: 10/15/2021-[facility] How have you been since you were released from the hospital? Patient stated she is doing ok.  Any questions or concerns? No  Items Reviewed: Did the pt receive and understand the discharge instructions provided? Yes  Medications obtained and verified?  No medications given at discharge  Other? No  Any new allergies since your discharge? No  Dietary orders reviewed? No Do you have support at home? Yes   Home Care and Equipment/Supplies: Were home health services ordered? not applicable If so, what is the name of the agency? N/A  Has the agency set up a time to come to the patient's home? not applicable Were any new equipment or medical supplies ordered?  No What is the name of the medical supply agency? N/A Were you able to get the supplies/equipment? not applicable Do you have any questions related to the use of the equipment or supplies? No  Functional Questionnaire: (I = Independent and D = Dependent) ADLs: I  Bathing/Dressing- I  Meal Prep- I  Eating- I  Maintaining continence- I  Transferring/Ambulation- I  Managing Meds- I  Follow up appointments reviewed:  PCP Hospital f/u appt confirmed? No   Specialist Hospital f/u appt confirmed? Yes  Scheduled to see Endocrinology on 10/19/2021 @ 10:00am. Are transportation arrangements needed? No  If their condition worsens, is the pt aware to call PCP or go to the Emergency Dept.? Yes Was the patient provided with contact information for the PCP's office or ED? Yes Was to pt encouraged to call back with questions or concerns? Yes

## 2021-10-17 DIAGNOSIS — G930 Cerebral cysts: Secondary | ICD-10-CM | POA: Diagnosis not present

## 2021-10-17 DIAGNOSIS — R42 Dizziness and giddiness: Secondary | ICD-10-CM | POA: Diagnosis not present

## 2021-10-17 DIAGNOSIS — R519 Headache, unspecified: Secondary | ICD-10-CM | POA: Diagnosis not present

## 2021-10-17 DIAGNOSIS — R413 Other amnesia: Secondary | ICD-10-CM | POA: Diagnosis not present

## 2021-10-17 DIAGNOSIS — G43009 Migraine without aura, not intractable, without status migrainosus: Secondary | ICD-10-CM | POA: Diagnosis not present

## 2021-10-17 DIAGNOSIS — H538 Other visual disturbances: Secondary | ICD-10-CM | POA: Diagnosis not present

## 2021-10-19 ENCOUNTER — Ambulatory Visit (INDEPENDENT_AMBULATORY_CARE_PROVIDER_SITE_OTHER): Payer: Medicaid Other | Admitting: "Endocrinology

## 2021-10-19 ENCOUNTER — Encounter: Payer: Self-pay | Admitting: "Endocrinology

## 2021-10-19 ENCOUNTER — Other Ambulatory Visit: Payer: Self-pay

## 2021-10-19 VITALS — BP 112/76 | HR 88 | Ht 60.0 in | Wt 158.2 lb

## 2021-10-19 DIAGNOSIS — F17200 Nicotine dependence, unspecified, uncomplicated: Secondary | ICD-10-CM

## 2021-10-19 DIAGNOSIS — E890 Postprocedural hypothyroidism: Secondary | ICD-10-CM

## 2021-10-19 MED ORDER — LEVOTHYROXINE SODIUM 150 MCG PO TABS: 150.0000 ug | ORAL_TABLET | Freq: Every day | ORAL | 1 refills | Status: DC

## 2021-10-19 NOTE — Progress Notes (Signed)
10/19/2021         Endocrinology follow-up note  Subjective:    Patient ID: Peggy [name], female    DOB: Dec 05, 1991.   Past Medical History:  Diagnosis Date   Abnormal Pap smear of cervix 06/22/2020   05/2020 pap LSIL negative HPV and GC/CHL  As per ASCCP guidelines repeat in 1 year, 5 year risk of CIN 3 + is 2%   Asthma    Gestational diabetes    Pyloric stenosis    Thyroid disease    Graves   Urticaria     Past Surgical History:  Procedure Laterality Date   PYLOROMYOTOMY      Social History   Socioeconomic History   Marital status: Divorced    Spouse name: Quetzally Callas   Number of children: 2   Years of education: Not on file   Highest education level: Some college, no degree  Occupational History   Not on file  Tobacco Use   Smoking status: Every Day    Packs/day: 0.25    Years: 5.00    Pack years: 1.25    Types: Cigarettes   Smokeless tobacco: Never   Tobacco comments:    2 cig/day  Vaping Use   Vaping Use: Never used  Substance and Sexual Activity   Alcohol use: Yes    Comment: rarely   Drug use: No   Sexual activity: Yes    Birth control/protection: Inserts  Other Topics Concern   Not on file  Social History Narrative   Not on file   Social Determinants of Health   Financial Resource Strain: Low Risk    Difficulty of Paying Living Expenses: Not hard at all  Food Insecurity: No Food Insecurity   Worried About Charity fundraiser in the Last Year: Never true   Ran Out of Food in the Last Year: Never true  Transportation Needs: No Transportation Needs   Lack of Transportation (Medical): No   Lack of Transportation (Non-Medical): No  Physical Activity: Sufficiently Active   Days of Exercise per Week: 5 days   Minutes of Exercise per Session: 60 min  Stress: Stress Concern Present   Feeling of Stress : Very much  Social Connections: Moderately Isolated   Frequency of Communication with Friends and Family: Twice a week   Frequency of  Social Gatherings with Friends and Family: Once a week   Attends Religious Services: 1 to 4 times per year   Active Member of [employer] or Organizations: No   Attends Music therapist: Never   Marital Status: Divorced    Family History  Problem Relation Age of Onset   Arthritis Mother    Cancer Mother        thyroid   Depression Mother    Hyperlipidemia Father    Asthma Father    Hypertension Father    Heart disease Father    COPD Maternal Grandfather    Diabetes Paternal Grandmother    Arthritis Paternal Grandmother    Asthma Paternal Grandmother    Heart disease Paternal Grandmother    Depression Paternal Grandmother    Hypertension Paternal Grandmother    Hyperlipidemia Paternal Grandmother    Other Neg Hx     Outpatient Encounter Medications as of 10/19/2021  Medication Sig   albuterol (PROAIR HFA) 108 (90 Base) MCG/ACT inhaler TAKE 2 PUFFS BY MOUTH EVERY 4- 6 HOURS AS NEEDED FOR WHEEZE OR SHORTNESS OF BREATH   albuterol (PROVENTIL) (2.5 MG/3ML) 0.083% nebulizer solution  Take 3 mLs (2.5 mg total) by nebulization every 6 (six) hours as needed for wheezing or shortness of breath.   budesonide-formoterol (SYMBICORT) 80-4.5 MCG/ACT inhaler Use 2 puffs twice a day with spacer to help prevent cough and wheeze   cetirizine (ZYRTEC) 10 MG tablet Take 2 tablets (20 mg total) by mouth 2 (two) times daily. (Patient taking differently: Take 10 mg by mouth 2 (two) times daily.)   Cholecalciferol (VITAMIN D3 PO) Take by mouth.   famotidine (PEPCID) 20 MG tablet TAKE 1 TABLET BY MOUTH TWICE A DAY   levothyroxine (SYNTHROID) 150 MCG tablet Take 1 tablet (150 mcg total) by mouth daily.   medroxyPROGESTERone (DEPO-PROVERA) 150 MG/ML injection Inject 1 mL (150 mg total) into the muscle every 3 (three) months.   metoprolol tartrate (LOPRESSOR) 25 MG tablet Take 0.5 tablets (12.5 mg total) by mouth 2 (two) times daily.   ondansetron (ZOFRAN) 4 MG tablet Take 1 tablet (4 mg total) by  mouth every 6 (six) hours.   predniSONE (DELTASONE) 20 MG tablet Take 20 mg by mouth daily with breakfast.   [DISCONTINUED] levothyroxine (SYNTHROID) 175 MCG tablet Take 175 mcg by mouth every morning.   [DISCONTINUED] levothyroxine (SYNTHROID) 200 MCG tablet Take 200 mcg by mouth daily.   No facility-administered encounter medications on file as of 10/19/2021.    ALLERGIES: Allergies  Allergen Reactions   Metronidazole Nausea Only    Patient requests gel form for all future treatment of BV    VACCINATION STATUS: Immunization History  Administered Date(s) Administered   Influenza,inj,Quad PF,6+ Mos 12/11/2014, 09/23/2018   MMR 12/12/2014   Pneumococcal Polysaccharide-23 07/12/2012, 12/12/2014, 08/17/2017   Tdap 12/11/2014, 06/02/2017, 02/18/2019     HPI  Peggy [name] is 30 y.o. female who is being engaged in telehealth via telephone after she was given radioactive iodine thyroid ablation for Graves' disease.   She is s/p I-131 thyroid ablation treatment for hyperthyroidism from Graves' disease on February 01, 2020.  She was given levothyroxine 112 mcg during her last visit.  She missed her appointment, returns with a higher dose of levothyroxine 200 mcg p.o. daily.  Her previsit labs were abnormal indicating inadequate replacement.  Her previous symptoms of primary hyperparathyroidism have largely resolved.  She has steady weight.   She admits to inconsistency taking her thyroid hormone. she denies dysphagia, choking, shortness of breath, no recent voice change.  She did have an anterior lower neck pain recently which for which she was given prednisone currently tapering off. she reports thyroid cancer in her mother who had to undergo thyroidectomy and what appears to be radioactive iodine thyroid remnant ablation.   she denies personal history of goiter.                           Review of systems See the note from last visit.   Objective:    BP 112/76   Pulse 88    Ht 5' (1.524 m)   Wt 158 lb 3.2 oz (71.8 kg)   BMI 30.90 kg/m   Wt Readings from Last 3 Encounters:  10/19/21 158 lb 3.2 oz (71.8 kg)  09/17/21 154 lb 8.7 oz (70.1 kg)  08/02/21 155 lb (70.3 kg)  Physical exam  -See the note from last visit.  -Neck exam is negative for goiter. CMP     Component Value Date/Time   NA 138 10/15/2021 0805   K 4.0 10/15/2021 0805   CL 105 10/15/2021 0805   CO2 27 10/15/2021 0805   GLUCOSE 105 (H) 10/15/2021 0805   BUN 15 10/15/2021 0805   CREATININE 0.76 10/15/2021 0805   CALCIUM 9.0 10/15/2021 0805   PROT 7.7 06/22/2021 1304   ALBUMIN 4.0 06/22/2021 1304   AST 20 06/22/2021 1304   ALT 17 06/22/2021 1304   ALKPHOS 52 06/22/2021 1304   BILITOT 0.3 06/22/2021 1304   GFRNONAA >60 10/15/2021 0805   GFRAA >60 08/27/2020 1437    CBC    Component Value Date/Time   WBC 11.4 (H) 10/15/2021 0805   RBC 4.41 10/15/2021 0805   HGB 13.1 10/15/2021 0805   HGB 11.8 01/28/2019 0844   HCT 41.0 10/15/2021 0805   HCT 34.8 01/28/2019 0844   PLT 337 10/15/2021 0805   PLT 328 01/28/2019 0844   MCV 93.0 10/15/2021 0805   MCV 89 01/28/2019 0844   MCH 29.7 10/15/2021 0805   MCHC 32.0 10/15/2021 0805   RDW 13.9 10/15/2021 0805   RDW 12.7 01/28/2019 0844   LYMPHSABS 2.6 10/15/2021 0805   LYMPHSABS 2.7 09/25/2018 0847   MONOABS 0.6 10/15/2021 0805   EOSABS 0.7 (H) 10/15/2021 0805   EOSABS 0.3 09/25/2018 0847   BASOSABS 0.1 10/15/2021 0805   BASOSABS 0.0 09/25/2018 0847     Diabetic Labs (most recent): Lab Results  Component Value Date   HGBA1C 5.0 09/25/2018    Lab Results  Component Value Date   TSH 81.573 (H) 10/15/2021   TSH 25.226 (H) 06/22/2021   TSH 53.72 (H) 02/07/2021   TSH 61.60 (A) 09/26/2020   TSH 90.500 (H) 06/07/2020   TSH 85.200 (H) 03/27/2020   TSH <0.005 (L) 01/06/2020   TSH <0.005 (L) 11/30/2019   TSH 0.01 (A) 09/27/2019   TSH 0.001 (L) 08/17/2019   FREET4 0.49 (L)  10/15/2021   FREET4 0.38 (L) 06/07/2020   FREET4 <0.10 (L) 03/27/2020   FREET4 1.64 01/06/2020   FREET4 1.76 11/30/2019        Assessment & Plan:   -RAI induced hypothyroidism   -  She is status post I-131 thyroid ablation on February 01, 2020 for severe 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 She has responded with subsequent RAI induced hypothyroidism. -Her previsit thyroid function tests are consistent with under replacement.  However, she admits to inconsistency taking her medication.  This led to over prescription of levothyroxine currently 200 mcg p.o. daily.  I discussed in detail the need for consistency and appropriate dose of thyroid hormone to avoid complications of iatrogenic thyrotoxicosis.    She agrees to lower her levothyroxine to 150 mcg p.o. daily before breakfast.    - We discussed about the correct intake of her thyroid hormone, on empty stomach at fasting, with water, separated by at least 30 minutes from breakfast and other medications,  and separated by more than 4 hours from calcium, iron, multivitamins, acid reflux medications (PPIs). -Patient is made aware of the fact that thyroid hormone replacement is needed for life, dose to be adjusted by periodic monitoring of thyroid function tests.  -  Given her family history of thyroid malignancy, she will be considered for thyroid ultrasound after her next visit.  She is advised to finish her current tapering dose of prednisone.    She  is advised to maintain close follow-up with her PCP. The patient was counseled on the dangers of tobacco use, and was advised to quit.  Reviewed strategies to maximize success, including removing cigarettes and smoking materials from environment.   I spent 22 minutes in the care of the patient today including review of labs from Thyroid Function, CMP, and other relevant labs ; imaging/biopsy records (current and previous including abstractions from other facilities); face-to-face time  discussing  her lab results and symptoms, medications doses, her options of short and long term treatment based on the latest standards of care / guidelines;   and documenting the encounter.  Peggy [name]  participated in the discussions, expressed understanding, and voiced agreement with the above plans.  All questions were answered to her satisfaction. she is encouraged to contact clinic should she have any questions or concerns prior to her return visit.   Follow up plan: Return in about 3 months (around 01/19/2022) for F/U with Pre-visit Labs.   Thank you for involving me in the care of this pleasant patient, and I will continue to update you with her progress.  Glade Lloyd, MD [facility] End[facility] [place] [facility] Phone: [phone]  Fax: [phone]   10/19/2021, 12:12 PM  This note was partially dictated with voice recognition software. Similar sounding words can be transcribed inadequately or may not  be corrected upon review.

## 2021-10-26 ENCOUNTER — Other Ambulatory Visit: Payer: Self-pay | Admitting: Physician Assistant

## 2021-10-26 ENCOUNTER — Other Ambulatory Visit (HOSPITAL_COMMUNITY): Payer: Self-pay | Admitting: Physician Assistant

## 2021-10-26 DIAGNOSIS — E0590 Thyrotoxicosis, unspecified without thyrotoxic crisis or storm: Secondary | ICD-10-CM

## 2021-10-26 DIAGNOSIS — E0500 Thyrotoxicosis with diffuse goiter without thyrotoxic crisis or storm: Secondary | ICD-10-CM

## 2021-10-30 ENCOUNTER — Ambulatory Visit: Payer: Medicaid Other

## 2021-11-02 ENCOUNTER — Ambulatory Visit: Payer: Medicaid Other | Admitting: *Deleted

## 2021-11-02 ENCOUNTER — Other Ambulatory Visit: Payer: Self-pay

## 2021-11-02 DIAGNOSIS — Z3042 Encounter for surveillance of injectable contraceptive: Secondary | ICD-10-CM | POA: Diagnosis not present

## 2021-11-02 MED ORDER — MEDROXYPROGESTERONE ACETATE 150 MG/ML IM SUSP
150.0000 mg | Freq: Once | INTRAMUSCULAR | Status: AC
  Administered 2021-11-02: 150 mg via INTRAMUSCULAR

## 2021-11-02 NOTE — Progress Notes (Signed)
   NURSE VISIT- INJECTION  SUBJECTIVE:  Peggy [name] is a 30 y.o. [phone] female here for a Depo Provera for contraception/period management. She is a GYN patient.   OBJECTIVE:  There were no vitals taken for this visit.  Appears well, in no apparent distress  Injection administered in: Right deltoid  Meds ordered this encounter  Medications   medroxyPROGESTERone (DEPO-PROVERA) injection 150 mg    ASSESSMENT: GYN patient Depo Provera for contraception/period management PLAN: Follow-up: in 11-13 weeks for next Depo   Peggy [name]  11/02/2021 12:02 PM

## 2021-11-08 ENCOUNTER — Other Ambulatory Visit: Payer: Self-pay

## 2021-11-08 ENCOUNTER — Ambulatory Visit (HOSPITAL_COMMUNITY)
Admission: RE | Admit: 2021-11-08 | Discharge: 2021-11-08 | Disposition: A | Discharge status: Home / Self Care | Payer: Medicaid Other | Source: Ambulatory Visit | Attending: Physician Assistant | Admitting: Physician Assistant

## 2021-11-08 DIAGNOSIS — E0590 Thyrotoxicosis, unspecified without thyrotoxic crisis or storm: Secondary | ICD-10-CM | POA: Insufficient documentation

## 2021-11-08 DIAGNOSIS — E0500 Thyrotoxicosis with diffuse goiter without thyrotoxic crisis or storm: Secondary | ICD-10-CM | POA: Diagnosis not present

## 2021-11-20 ENCOUNTER — Other Ambulatory Visit: Payer: Self-pay

## 2021-11-20 ENCOUNTER — Ambulatory Visit
Admission: EM | Admit: 2021-11-20 | Discharge: 2021-11-20 | Disposition: A | Discharge status: Home / Self Care | Payer: Medicaid Other | Attending: Student | Admitting: Student

## 2021-11-20 DIAGNOSIS — J111 Influenza due to unidentified influenza virus with other respiratory manifestations: Secondary | ICD-10-CM | POA: Diagnosis not present

## 2021-11-20 DIAGNOSIS — Z1152 Encounter for screening for COVID-19: Secondary | ICD-10-CM

## 2021-11-20 DIAGNOSIS — J4521 Mild intermittent asthma with (acute) exacerbation: Secondary | ICD-10-CM | POA: Diagnosis not present

## 2021-11-20 DIAGNOSIS — Z20828 Contact with and (suspected) exposure to other viral communicable diseases: Secondary | ICD-10-CM

## 2021-11-20 MED ORDER — OSELTAMIVIR PHOSPHATE 75 MG PO CAPS: 75.0000 mg | ORAL_CAPSULE | Freq: Two times a day (BID) | ORAL | 0 refills | Status: DC

## 2021-11-20 MED ORDER — PREDNISONE 20 MG PO TABS: 40.0000 mg | ORAL_TABLET | Freq: Every day | ORAL | 0 refills | Status: AC

## 2021-11-20 MED ORDER — ONDANSETRON 8 MG PO TBDP: 8.0000 mg | ORAL_TABLET | Freq: Three times a day (TID) | ORAL | 0 refills | Status: DC | PRN

## 2021-11-20 NOTE — Discharge Instructions (Addendum)
-  Tamiflu twice daily x5 days. This medication can cause nausea, so I also sent nausea medication. You can stop the Tamiflu if you don't like it or if it causes side effects.  -Take the Zofran (ondansetron) up to 3 times daily for nausea and vomiting. -Albuterol inhaler as needed for cough, wheezing, shortness of breath, 1 to 2 puffs every 6 hours as needed. -Prednisone, 2 pills taken at the same time for 5 days in a row.  Try taking this earlier in the day as it can give you energy. Avoid NSAIDs like ibuprofen and alleve while taking this medication as they can increase your risk of stomach upset and even GI bleeding when in combination with a steroid. You can continue tylenol (acetaminophen) up to 1000mg  3x daily. -For fevers/chills, bodyaches, headaches- You can take Tylenol up to 1000 mg 3 times daily, and ibuprofen up to 600 mg 3 times daily with food.  You can take these together, or alternate every 3-4 hours. -Drink plenty of water/gatorade and get plenty of rest -With a virus, you're typically contagious for 5-7 days, or as long as you're having fevers.  -Come back and see [name] if things are getting worse instead of better, like shortness of breath, chest pain, fevers and chills that are getting higher instead of lower and do not come down with Tylenol or ibuprofen, etc.

## 2021-11-20 NOTE — ED Triage Notes (Signed)
Patient presents to Urgent Care with complaints of fever, cough, and nasal congestion since yesterday. Not taking any meds.

## 2021-11-20 NOTE — ED Provider Notes (Signed)
[facility]    CSN: [id number] Arrival date & time: 11/20/21  1205      History   Chief Complaint Chief Complaint  Patient presents with   Fever   Cough   Nasal Congestion    HPI Peggy [name] is a 30 y.o. female presenting with viral syndrome x2 days following exposure to the flu. Medical history asthma, currently poorly controlled on albuterol and Symbicort inhalers.  Describes fevers and chills, cough, congestion, wheezing for about 2 days.  Endorses fevers and chills, states she is febrile on her thermometer but she does not know the exact temperature.  Decreased appetite but tolerating fluids and food.  Denies body aches.  Denies nausea, vomiting, diarrhea.  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   Chronic urticaria 10/12/2020   Recurrent infections 10/12/2020   Seasonal and perennial allergic rhinitis 10/12/2020   Mild persistent asthma, uncomplicated 71/69/6789   Abnormal Pap smear of cervix 06/22/2020   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/09/2020   Graves' disease 01/24/2020   Depression screening 05/25/2019   History of gestational diabetes 10/28/2018   Low vitamin D level 02/12/2017   Current smoker 07/25/2014    Past Surgical History:  Procedure Laterality Date   PYLOROMYOTOMY      OB History     Gravida  4   Para  4   Term  4   Preterm  0   AB  0   Living  4      SAB  0   IAB  0   Ectopic  0   Multiple  0   Live Births  4            Home Medications    Prior to Admission  medications   Medication Sig Start Date End Date Taking? Authorizing Provider  ondansetron (ZOFRAN-ODT) 8 MG disintegrating tablet Take 1 tablet (8 mg total) by mouth every 8 (eight) hours as needed for nausea or vomiting. 11/20/21  Yes Hazel Sams, PA-C  oseltamivir (TAMIFLU) 75 MG capsule Take 1 capsule (75 mg total) by mouth every 12 (twelve) hours. 11/20/21  Yes Hazel Sams, PA-C  predniSONE (DELTASONE) 20 MG tablet Take 2 tablets (40 mg total) by mouth daily for 5 days. Take with breakfast or lunch. Avoid NSAIDs (ibuprofen, etc) while taking this medication. 11/20/21 11/25/21 Yes Hazel Sams, PA-C  albuterol [facility] HFA) 108 (90 Base) MCG/ACT inhaler TAKE 2 PUFFS BY MOUTH EVERY 4- 6 HOURS AS NEEDED FOR WHEEZE OR SHORTNESS OF BREATH 06/22/21   Lorin Glass, PA-C  albuterol (PROVENTIL) (2.5 MG/3ML) 0.083% nebulizer solution Take 3 mLs (2.5 mg total) by nebulization every 6 (six) hours as needed for wheezing or shortness of breath. 06/22/21   Lorin Glass, PA-C  budesonide-formoterol [facility]) 80-4.5 MCG/ACT inhaler Use 2 puffs twice a day with spacer to help prevent cough and wheeze 03/28/21   Althea Charon, FNP  cetirizine (ZYRTEC) 10 MG tablet Take  2 tablets (20 mg total) by mouth 2 (two) times daily. Patient taking differently: Take 10 mg by mouth 2 (two) times daily. 10/11/20   Valentina Shaggy, MD  Cholecalciferol (VITAMIN D3 PO) Take by mouth.    [provider]  famotidine (PEPCID) 20 MG tablet TAKE 1 TABLET BY MOUTH TWICE A DAY 06/22/21   Valentina Shaggy, MD  levothyroxine (SYNTHROID) 150 MCG tablet Take 1 tablet (150 mcg total) by mouth daily. 10/19/21   Cassandria Anger, MD  medroxyPROGESTERone (DEPO-PROVERA) 150 MG/ML injection Inject 1 mL (150 mg total) into the muscle every 3 (three) months. 07/31/21   Roma Schanz, CNM  metoprolol tartrate (LOPRESSOR) 25 MG tablet Take 0.5 tablets (12.5 mg total) by mouth 2 (two) times daily.  10/15/21   Arnoldo Lenis, MD  ondansetron (ZOFRAN) 4 MG tablet Take 1 tablet (4 mg total) by mouth every 6 (six) hours. 09/20/21   Lestine Box, PA-C    Family History Family History  Problem Relation Age of Onset   Arthritis Mother    Cancer Mother        thyroid   Depression Mother    Hyperlipidemia Father    Asthma Father    Hypertension Father    Heart disease Father    COPD Maternal Grandfather    Diabetes Paternal Grandmother    Arthritis Paternal Grandmother    Asthma Paternal Grandmother    Heart disease Paternal Grandmother    Depression Paternal Grandmother    Hypertension Paternal Grandmother    Hyperlipidemia Paternal Grandmother    Other Neg Hx     Social History Social History   Tobacco Use   Smoking status: Every Day    Packs/day: 0.25    Years: 5.00    Pack years: 1.25    Types: Cigarettes   Smokeless tobacco: Never   Tobacco comments:    2 cig/day  Vaping Use   Vaping Use: Never used  Substance Use Topics   Alcohol use: Yes    Comment: rarely   Drug use: No     Allergies   Metronidazole   Review of Systems Review of Systems  Constitutional:  Positive for chills and fever. Negative for appetite change.  HENT:  Positive for congestion. Negative for ear pain, rhinorrhea, sinus pressure, sinus pain and sore throat.   Eyes:  Negative for redness and visual disturbance.  Respiratory:  Positive for cough, shortness of breath and wheezing. Negative for chest tightness.   Cardiovascular:  Negative for chest pain and palpitations.  Gastrointestinal:  Negative for abdominal pain, constipation, diarrhea, nausea and vomiting.  Genitourinary:  Negative for dysuria, frequency and urgency.  Musculoskeletal:  Negative for myalgias.  Neurological:  Negative for dizziness, weakness and headaches.  Psychiatric/Behavioral:  Negative for confusion.   All other systems reviewed and are negative.   Physical Exam Triage Vital Signs ED Triage Vitals   Enc Vitals Group     BP 11/20/21 1431 124/80     Pulse Rate 11/20/21 1431 76     Resp 11/20/21 1431 16     Temp 11/20/21 1431 97.8 F (36.6 C)     Temp Source 11/20/21 1431 Temporal     SpO2 11/20/21 1431 98 %     Weight --      Height --      Head Circumference --      Peak Flow --      Pain Score 11/20/21 1430 0     Pain  Loc --      Pain Edu? --      Excl. in [place]? --    No data found.  Updated Vital Signs BP 124/80 (BP Location: Right Arm)   Pulse 76   Temp 97.8 F (36.6 C) (Temporal)   Resp 16   SpO2 98%   Visual Acuity Right Eye Distance:   Left Eye Distance:   Bilateral Distance:    Right Eye Near:   Left Eye Near:    Bilateral Near:     Physical Exam Vitals reviewed.  Constitutional:      General: She is not in acute distress.    Appearance: Normal appearance. She is not ill-appearing.  HENT:     Head: Normocephalic and atraumatic.     Right Ear: Tympanic membrane, ear canal and external ear normal. No tenderness. No middle ear effusion. There is no impacted cerumen. Tympanic membrane is not perforated, erythematous, retracted or bulging.     Left Ear: Tympanic membrane, ear canal and external ear normal. No tenderness.  No middle ear effusion. There is no impacted cerumen. Tympanic membrane is not perforated, erythematous, retracted or bulging.     Nose: Nose normal. No congestion.     Mouth/Throat:     Mouth: Mucous membranes are moist.     Pharynx: Uvula midline. No oropharyngeal exudate or posterior oropharyngeal erythema.  Eyes:     Extraocular Movements: Extraocular movements intact.     Pupils: Pupils are equal, round, and reactive to light.  Cardiovascular:     Rate and Rhythm: Normal rate and regular rhythm.     Heart sounds: Normal heart sounds.  Pulmonary:     Effort: Pulmonary effort is normal.     Breath sounds: Wheezing present. No decreased breath sounds, rhonchi or rales.     Comments: Expiratory wheezes throughout Abdominal:      Palpations: Abdomen is soft.     Tenderness: There is no abdominal tenderness. There is no guarding or rebound.  Lymphadenopathy:     Cervical: No cervical adenopathy.     Right cervical: No superficial cervical adenopathy.    Left cervical: No superficial cervical adenopathy.  Neurological:     General: No focal deficit present.     Mental Status: She is alert and oriented to person, place, and time.  Psychiatric:        Mood and Affect: Mood normal.        Behavior: Behavior normal.        Thought Content: Thought content normal.        Judgment: Judgment normal.     UC Treatments / Results  Labs (all labs ordered are listed, but only abnormal results are displayed) Labs Reviewed  COVID-19, FLU A+B NAA    EKG   Radiology No results found.  Procedures Procedures (including critical care time)  Medications Ordered in UC Medications - No data to display  Initial Impression / Assessment and Plan / UC Course  I have reviewed the triage vital signs and the nursing notes.  Pertinent labs & imaging results that were available during my care of the patient were reviewed by me and considered in my medical decision making (see chart for details).     This patient is a very pleasant 30 y.o. year old female presenting with influenza and asthma exacerbation, following exposure to influenza. Today this pt is afebrile nontachycardic nontachypneic, oxygenating well on room air, wheezes throughout but no rhonchi or rales.   Covid and  influenza PCR sent.  Tamiflu sent. Also sent prednisone and zofran ODT Continue inhalers.  ED return precautions discussed. Patient verbalizes understanding and agreement.   Level 4 for acute exacerbation of chronic condition and prescription drug management.  Final Clinical Impressions(s) / UC Diagnoses   Final diagnoses:  Encounter for screening for COVID-19  Influenza with respiratory manifestation  Mild intermittent asthma with acute  exacerbation  Exposure to influenza     Discharge Instructions      -Tamiflu twice daily x5 days. This medication can cause nausea, so I also sent nausea medication. You can stop the Tamiflu if you don't like it or if it causes side effects.  -Take the Zofran (ondansetron) up to 3 times daily for nausea and vomiting. -Albuterol inhaler as needed for cough, wheezing, shortness of breath, 1 to 2 puffs every 6 hours as needed. -Prednisone, 2 pills taken at the same time for 5 days in a row.  Try taking this earlier in the day as it can give you energy. Avoid NSAIDs like ibuprofen and alleve while taking this medication as they can increase your risk of stomach upset and even GI bleeding when in combination with a steroid. You can continue tylenol (acetaminophen) up to 1000mg  3x daily. -For fevers/chills, bodyaches, headaches- You can take Tylenol up to 1000 mg 3 times daily, and ibuprofen up to 600 mg 3 times daily with food.  You can take these together, or alternate every 3-4 hours. -Drink plenty of water/gatorade and get plenty of rest -With a virus, you're typically contagious for 5-7 days, or as long as you're having fevers.  -Come back and see [name] if things are getting worse instead of better, like shortness of breath, chest pain, fevers and chills that are getting higher instead of lower and do not come down with Tylenol or ibuprofen, etc.      ED Prescriptions     Medication Sig Dispense Auth. Provider   oseltamivir (TAMIFLU) 75 MG capsule Take 1 capsule (75 mg total) by mouth every 12 (twelve) hours. 10 capsule Marin Roberts E, PA-C   ondansetron (ZOFRAN-ODT) 8 MG disintegrating tablet Take 1 tablet (8 mg total) by mouth every 8 (eight) hours as needed for nausea or vomiting. 20 tablet Hazel Sams, PA-C   predniSONE (DELTASONE) 20 MG tablet Take 2 tablets (40 mg total) by mouth daily for 5 days. Take with breakfast or lunch. Avoid NSAIDs (ibuprofen, etc) while taking this  medication. 10 tablet Hazel Sams, PA-C      PDMP not reviewed this encounter.   Hazel Sams, PA-C 11/20/21 1527

## 2021-11-21 LAB — COVID-19, FLU A+B NAA
Influenza A, NAA: NOT DETECTED
Influenza B, NAA: NOT DETECTED
SARS-CoV-2, NAA: NOT DETECTED

## 2021-11-26 ENCOUNTER — Other Ambulatory Visit: Payer: Self-pay | Admitting: Women's Health

## 2021-11-26 MED ORDER — ORILISSA 150 MG PO TABS: 1.0000 | ORAL_TABLET | Freq: Every day | ORAL | 3 refills | Status: DC

## 2021-11-28 NOTE — Progress Notes (Signed)
This encounter was created in error - please disregard.

## 2021-12-18 ENCOUNTER — Encounter: Payer: Self-pay | Admitting: Emergency Medicine

## 2021-12-18 ENCOUNTER — Ambulatory Visit (HOSPITAL_COMMUNITY)
Admission: RE | Admit: 2021-12-18 | Discharge: 2021-12-18 | Disposition: A | Discharge status: Home / Self Care | Payer: Medicaid Other | Source: Ambulatory Visit | Attending: Student | Admitting: Student

## 2021-12-18 ENCOUNTER — Ambulatory Visit
Admission: EM | Admit: 2021-12-18 | Discharge: 2021-12-18 | Disposition: A | Discharge status: Home / Self Care | Payer: Medicaid Other | Attending: Student | Admitting: Student

## 2021-12-18 ENCOUNTER — Ambulatory Visit: Payer: Medicaid Other

## 2021-12-18 ENCOUNTER — Other Ambulatory Visit: Payer: Self-pay

## 2021-12-18 DIAGNOSIS — J189 Pneumonia, unspecified organism: Secondary | ICD-10-CM | POA: Diagnosis not present

## 2021-12-18 DIAGNOSIS — R052 Subacute cough: Secondary | ICD-10-CM | POA: Diagnosis not present

## 2021-12-18 DIAGNOSIS — R059 Cough, unspecified: Secondary | ICD-10-CM | POA: Diagnosis not present

## 2021-12-18 DIAGNOSIS — R0602 Shortness of breath: Secondary | ICD-10-CM | POA: Insufficient documentation

## 2021-12-18 MED ORDER — AMOXICILLIN-POT CLAVULANATE 875-125 MG PO TABS: 1.0000 | ORAL_TABLET | Freq: Two times a day (BID) | ORAL | 0 refills | Status: DC

## 2021-12-18 NOTE — Discharge Instructions (Addendum)
-  Start the antibiotic-Augmentin (amoxicillin-clavulanate), 1 pill every 12 hours for 7 days.  You can take this with food like with breakfast and dinner. -Albuterol inhaler as needed for cough, wheezing, shortness of breath, 1 to 2 puffs every 6 hours as needed.

## 2021-12-18 NOTE — ED Provider Notes (Addendum)
[facility]    CSN: [id number] Arrival date & time: 12/18/21  1146      History   Chief Complaint No chief complaint on file.   HPI Peggy [name] is a 30 y.o. female presenting with worsening cough following the flu 2 weeks ago.  Medical history asthma that is typically well controlled on albuterol inhaler, she has been using this as directed and declines refills today.  She was clinically diagnosed with the flu on 11/22 (though covid and PCR testing was negative), Tamiflu, Zofran, prednisone was sent which she has completed.  States that the cough is getting progressively worse with shortness of breath and productive of green sputum.  Temperature running 99-100 at home.  Denies weakness, dizziness, chest pain.  States that she has a history of pneumonia and is concerned for this.  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   Chronic urticaria 10/12/2020   Recurrent infections 10/12/2020   Seasonal and perennial allergic rhinitis 10/12/2020   Mild persistent asthma, uncomplicated 66/05/3015   Abnormal Pap smear of cervix 06/22/2020   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/09/2020   Graves' disease 01/24/2020   Depression screening 05/25/2019   History of gestational diabetes 10/28/2018   Low vitamin D level 02/12/2017   Current smoker 07/25/2014    Past Surgical History:  Procedure Laterality Date   PYLOROMYOTOMY      OB History     Gravida  4   Para  4   Term  4   Preterm  0   AB  0   Living  4      SAB  0   IAB   0   Ectopic  0   Multiple  0   Live Births  4            Home Medications    Prior to Admission medications   Medication Sig Start Date End Date Taking? Authorizing Provider  amoxicillin-clavulanate (AUGMENTIN) 875-125 MG tablet Take 1 tablet by mouth every 12 (twelve) hours. 12/18/21  Yes Hazel Sams, PA-C  albuterol [facility] HFA) 108 (90 Base) MCG/ACT inhaler TAKE 2 PUFFS BY MOUTH EVERY 4- 6 HOURS AS NEEDED FOR WHEEZE OR SHORTNESS OF BREATH 06/22/21   Lorin Glass, PA-C  albuterol (PROVENTIL) (2.5 MG/3ML) 0.083% nebulizer solution Take 3 mLs (2.5 mg total) by nebulization every 6 (six) hours as needed for wheezing or shortness of breath. 06/22/21   Lorin Glass, PA-C  budesonide-formoterol [facility]) 80-4.5 MCG/ACT inhaler Use 2 puffs twice a day with spacer to help prevent cough and wheeze 03/28/21   Althea Charon, FNP  cetirizine (ZYRTEC) 10 MG tablet Take 2 tablets (20 mg total) by mouth 2 (two) times daily. Patient taking differently: Take 10 mg by mouth 2 (two) times daily. 10/11/20   Valentina Shaggy, MD  Cholecalciferol (VITAMIN D3 PO) Take by mouth.    [provider]  Elagolix Sodium (ORILISSA) 150 MG TABS Take 1  tablet by mouth daily. 11/26/21   Roma Schanz, CNM  famotidine (PEPCID) 20 MG tablet TAKE 1 TABLET BY MOUTH TWICE A DAY 06/22/21   Valentina Shaggy, MD  levothyroxine (SYNTHROID) 150 MCG tablet Take 1 tablet (150 mcg total) by mouth daily. 10/19/21   Cassandria Anger, MD  medroxyPROGESTERone (DEPO-PROVERA) 150 MG/ML injection Inject 1 mL (150 mg total) into the muscle every 3 (three) months. 07/31/21   Roma Schanz, CNM  metoprolol tartrate (LOPRESSOR) 25 MG tablet Take 0.5 tablets (12.5 mg total) by mouth 2 (two) times daily. 10/15/21   Arnoldo Lenis, MD  ondansetron (ZOFRAN) 4 MG tablet Take 1 tablet (4 mg total) by mouth every 6 (six) hours. 09/20/21   [place], [place], PA-C  ondansetron (ZOFRAN-ODT) 8 MG  disintegrating tablet Take 1 tablet (8 mg total) by mouth every 8 (eight) hours as needed for nausea or vomiting. 11/20/21   Hazel Sams, PA-C  oseltamivir (TAMIFLU) 75 MG capsule Take 1 capsule (75 mg total) by mouth every 12 (twelve) hours. 11/20/21   Hazel Sams, PA-C    Family History Family History  Problem Relation Age of Onset   Arthritis Mother    Cancer Mother        thyroid   Depression Mother    Hyperlipidemia Father    Asthma Father    Hypertension Father    Heart disease Father    COPD Maternal Grandfather    Diabetes Paternal Grandmother    Arthritis Paternal Grandmother    Asthma Paternal Grandmother    Heart disease Paternal Grandmother    Depression Paternal Grandmother    Hypertension Paternal Grandmother    Hyperlipidemia Paternal Grandmother    Other Neg Hx     Social History Social History   Tobacco Use   Smoking status: Every Day    Packs/day: 0.25    Years: 5.00    Pack years: 1.25    Types: Cigarettes   Smokeless tobacco: Never   Tobacco comments:    2 cig/day  Vaping Use   Vaping Use: Never used  Substance Use Topics   Alcohol use: Yes    Comment: rarely   Drug use: No     Allergies   Metronidazole   Review of Systems Review of Systems  Constitutional:  Negative for appetite change, chills and fever.  HENT:  Positive for congestion. Negative for dental problem, ear pain, rhinorrhea, sinus pressure, sinus pain and sore throat.   Eyes:  Negative for redness and visual disturbance.  Respiratory:  Positive for cough and shortness of breath. Negative for chest tightness and wheezing.   Cardiovascular:  Negative for chest pain and palpitations.  Gastrointestinal:  Negative for abdominal pain, constipation, diarrhea, nausea and vomiting.  Genitourinary:  Negative for dysuria, frequency and urgency.  Musculoskeletal:  Negative for myalgias.  Neurological:  Negative for dizziness, weakness and headaches.  Psychiatric/Behavioral:   Negative for confusion.   All other systems reviewed and are negative.   Physical Exam Triage Vital Signs ED Triage Vitals  Enc Vitals Group     BP 12/18/21 1202 108/76     Pulse Rate 12/18/21 1202 76     Resp 12/18/21 1202 18     Temp 12/18/21 1202 99 F (37.2 C)     Temp Source 12/18/21 1202 Oral     SpO2 12/18/21 1202 95 %     Weight --      Height --      Head  Circumference --      Peak Flow --      Pain Score 12/18/21 1203 4     Pain Loc --      Pain Edu? --      Excl. in [place]? --    No data found.  Updated Vital Signs BP 108/76 (BP Location: Right Arm)    Pulse 76    Temp 99 F (37.2 C) (Oral)    Resp 18    SpO2 95%   Visual Acuity Right Eye Distance:   Left Eye Distance:   Bilateral Distance:    Right Eye Near:   Left Eye Near:    Bilateral Near:     Physical Exam Vitals reviewed.  Constitutional:      General: She is not in acute distress.    Appearance: Normal appearance. She is ill-appearing.  HENT:     Head: Normocephalic and atraumatic.     Right Ear: Tympanic membrane, ear canal and external ear normal. No tenderness. No middle ear effusion. There is no impacted cerumen. Tympanic membrane is not perforated, erythematous, retracted or bulging.     Left Ear: Tympanic membrane, ear canal and external ear normal. No tenderness.  No middle ear effusion. There is no impacted cerumen. Tympanic membrane is not perforated, erythematous, retracted or bulging.     Nose: Nose normal. No congestion.     Mouth/Throat:     Mouth: Mucous membranes are moist.     Pharynx: Uvula midline. No oropharyngeal exudate or posterior oropharyngeal erythema.  Eyes:     Extraocular Movements: Extraocular movements intact.     Pupils: Pupils are equal, round, and reactive to light.  Cardiovascular:     Rate and Rhythm: Normal rate and regular rhythm.     Heart sounds: Normal heart sounds.  Pulmonary:     Effort: Pulmonary effort is normal.     Breath sounds: Rhonchi present.  No decreased breath sounds, wheezing or rales.     Comments: Rhonchi throughout R>L Abdominal:     Palpations: Abdomen is soft.     Tenderness: There is no abdominal tenderness. There is no guarding or rebound.  Lymphadenopathy:     Cervical: No cervical adenopathy.     Right cervical: No superficial cervical adenopathy.    Left cervical: No superficial cervical adenopathy.  Neurological:     General: No focal deficit present.     Mental Status: She is alert and oriented to person, place, and time.  Psychiatric:        Mood and Affect: Mood normal.        Behavior: Behavior normal.        Thought Content: Thought content normal.        Judgment: Judgment normal.     UC Treatments / Results  Labs (all labs ordered are listed, but only abnormal results are displayed) Labs Reviewed  COVID-19, FLU A+B NAA    EKG   Radiology No results found.  Procedures Procedures (including critical care time)  Medications Ordered in UC Medications - No data to display  Initial Impression / Assessment and Plan / UC Course  I have reviewed the triage vital signs and the nursing notes.  Pertinent labs & imaging results that were available during my care of the patient were reviewed by me and considered in my medical decision making (see chart for details).     This patient is a very pleasant 30 y.o. year old female presenting with suspected pneumonia. Today  this pt is afebrile nontachycardic nontachypneic, oxygenating well on room air, rhonchi throughout but no wheezes or rales. Injection contraception.   CXR taken to confirm at patient request - negative. Augmentin sent.   She is an asthmatic.  Declines refills on her albuterol inhaler today.  ED return precautions discussed. Patient verbalizes understanding and agreement.   Coding Level 4 for acute illness with systemic symptoms, and prescription drug management  .   Final Clinical Impressions(s) / UC Diagnoses   Final  diagnoses:  Pneumonia due to infectious organism, unspecified laterality, unspecified part of lung     Discharge Instructions      -Start the antibiotic-Augmentin (amoxicillin-clavulanate), 1 pill every 12 hours for 7 days.  You can take this with food like with breakfast and dinner. -Albuterol inhaler as needed for cough, wheezing, shortness of breath, 1 to 2 puffs every 6 hours as needed.    ED Prescriptions     Medication Sig Dispense Auth. Provider   amoxicillin-clavulanate (AUGMENTIN) 875-125 MG tablet Take 1 tablet by mouth every 12 (twelve) hours. 14 tablet Hazel Sams, PA-C      PDMP not reviewed this encounter.   Hazel Sams, PA-C 12/18/21 [place], PA-C 12/20/21 [phone]

## 2021-12-18 NOTE — ED Triage Notes (Signed)
Hx of flu 2 weeks ago.  States she still has cough, woke up this morning with low grade fever.  States she does feel SOB.  Productive cough with green sputum.

## 2021-12-19 LAB — COVID-19, FLU A+B NAA
Influenza A, NAA: NOT DETECTED
Influenza B, NAA: NOT DETECTED
SARS-CoV-2, NAA: NOT DETECTED

## 2022-01-03 ENCOUNTER — Telehealth: Payer: Self-pay | Admitting: *Deleted

## 2022-01-03 NOTE — Telephone Encounter (Signed)
.. °  Medicaid Managed Care   Unsuccessful Outreach Note  01/03/2022 Name: Peggy [name] MRN: [id number] DOB: 19-Oct-1991  Referred by: Practice, [facility] Reason for referral : High Risk Managed Medicaid (I called the patient today to get her scheduled with the MM Team. I left my name and number on her VM.)   An unsuccessful telephone outreach was attempted today. The patient was referred to the case management team for assistance with care management and care coordination.   Follow Up Plan: The care management team will reach out to the patient again over the next 14 days.   [place]

## 2022-01-21 ENCOUNTER — Ambulatory Visit: Payer: Medicaid Other | Admitting: "Endocrinology

## 2022-01-25 ENCOUNTER — Ambulatory Visit (INDEPENDENT_AMBULATORY_CARE_PROVIDER_SITE_OTHER): Payer: Medicaid Other | Admitting: *Deleted

## 2022-01-25 ENCOUNTER — Other Ambulatory Visit: Payer: Self-pay

## 2022-01-25 DIAGNOSIS — Z3042 Encounter for surveillance of injectable contraceptive: Secondary | ICD-10-CM | POA: Diagnosis not present

## 2022-01-25 MED ORDER — MEDROXYPROGESTERONE ACETATE 150 MG/ML IM SUSP
150.0000 mg | Freq: Once | INTRAMUSCULAR | Status: AC
  Administered 2022-01-25: 150 mg via INTRAMUSCULAR

## 2022-01-25 NOTE — Progress Notes (Signed)
° °  NURSE VISIT- INJECTION  SUBJECTIVE:  Peggy [name] is a 31 y.o. [phone] female here for a Depo Provera for contraception/period management. She is a GYN patient.   OBJECTIVE:  There were no vitals taken for this visit.  Appears well, in no apparent distress  Injection administered in: Left deltoid  Meds ordered this encounter  Medications   medroxyPROGESTERone (DEPO-PROVERA) injection 150 mg    ASSESSMENT: GYN patient Depo Provera for contraception/period management PLAN: Follow-up: in 11-13 weeks for next Depo   Levy Pupa  01/25/2022 12:18 PM

## 2022-01-28 ENCOUNTER — Encounter: Payer: Self-pay | Admitting: Emergency Medicine

## 2022-01-28 ENCOUNTER — Ambulatory Visit
Admission: EM | Admit: 2022-01-28 | Discharge: 2022-01-28 | Disposition: A | Discharge status: Home / Self Care | Payer: Medicaid Other | Attending: Urgent Care | Admitting: Urgent Care

## 2022-01-28 ENCOUNTER — Other Ambulatory Visit: Payer: Self-pay

## 2022-01-28 DIAGNOSIS — H6983 Other specified disorders of Eustachian tube, bilateral: Secondary | ICD-10-CM

## 2022-01-28 DIAGNOSIS — J3089 Other allergic rhinitis: Secondary | ICD-10-CM | POA: Diagnosis not present

## 2022-01-28 DIAGNOSIS — J4530 Mild persistent asthma, uncomplicated: Secondary | ICD-10-CM | POA: Diagnosis not present

## 2022-01-28 MED ORDER — LEVOCETIRIZINE DIHYDROCHLORIDE 5 MG PO TABS: 5.0000 mg | ORAL_TABLET | Freq: Every evening | ORAL | 0 refills | Status: DC

## 2022-01-28 MED ORDER — PREDNISONE 50 MG PO TABS: 50.0000 mg | ORAL_TABLET | Freq: Every day | ORAL | 0 refills | Status: DC

## 2022-01-28 NOTE — ED Provider Notes (Signed)
[facility]   MRN: [id number] DOB: June 07, 1991  Subjective:   Peggy [name] is a 31 y.o. female presenting for 3 week history of persistent recurrent sinus pressure, bilateral ear fullness and ear popping, decreased hearing worse over the left.  No pain, drainage, fevers, body aches.  Patient has a history of chronic allergies, chronic urticaria, recurrent sinus infections and perennial allergic rhinitis.  She takes Zyrtec twice daily.  She also has a history of asthma but this is not bothering her currently.  She does have an allergist that she sees.  Has not seen an ENT doctor but reports that she did have an MRI done recently that she did not fully understand.  No current facility-administered medications for this encounter.  Current Outpatient Medications:    albuterol (PROAIR HFA) 108 (90 Base) MCG/ACT inhaler, TAKE 2 PUFFS BY MOUTH EVERY 4- 6 HOURS AS NEEDED FOR WHEEZE OR SHORTNESS OF BREATH, Disp: 1 each, Rfl: 1   albuterol (PROVENTIL) (2.5 MG/3ML) 0.083% nebulizer solution, Take 3 mLs (2.5 mg total) by nebulization every 6 (six) hours as needed for wheezing or shortness of breath., Disp: 75 mL, Rfl: 1   budesonide-formoterol (SYMBICORT) 80-4.5 MCG/ACT inhaler, Use 2 puffs twice a day with spacer to help prevent cough and wheeze, Disp: 1 each, Rfl: 3   cetirizine (ZYRTEC) 10 MG tablet, Take 2 tablets (20 mg total) by mouth 2 (two) times daily. (Patient taking differently: Take 10 mg by mouth 2 (two) times daily.), Disp: 120 tablet, Rfl: 5   Cholecalciferol (VITAMIN D3 PO), Take by mouth., Disp: , Rfl:    famotidine (PEPCID) 20 MG tablet, TAKE 1 TABLET BY MOUTH TWICE A DAY, Disp: 60 tablet, Rfl: 2   levothyroxine (SYNTHROID) 150 MCG tablet, Take 1 tablet (150 mcg total) by mouth daily., Disp: 90 tablet, Rfl: 1   medroxyPROGESTERone (DEPO-PROVERA) 150 MG/ML injection, Inject 1 mL (150 mg total) into the muscle every 3 (three) months., Disp: 1 mL, Rfl: 3   metoprolol  tartrate (LOPRESSOR) 25 MG tablet, Take 0.5 tablets (12.5 mg total) by mouth 2 (two) times daily., Disp: 90 tablet, Rfl: 1   Allergies  Allergen Reactions   Metronidazole Nausea Only    Patient requests gel form for all future treatment of BV    Past Medical History:  Diagnosis Date   Abnormal Pap smear of cervix 06/22/2020   05/2020 pap LSIL negative HPV and GC/CHL  As per ASCCP guidelines repeat in 1 year, 5 year risk of CIN 3 + is 2%   Asthma    Gestational diabetes    Pyloric stenosis    Thyroid disease    Graves   Urticaria      Past Surgical History:  Procedure Laterality Date   PYLOROMYOTOMY      Family History  Problem Relation Age of Onset   Arthritis Mother    Cancer Mother        thyroid   Depression Mother    Hyperlipidemia Father    Asthma Father    Hypertension Father    Heart disease Father    COPD Maternal Grandfather    Diabetes Paternal Grandmother    Arthritis Paternal Grandmother    Asthma Paternal Grandmother    Heart disease Paternal Grandmother    Depression Paternal Grandmother    Hypertension Paternal Grandmother    Hyperlipidemia Paternal Grandmother    Other Neg Hx     Social History   Tobacco Use   Smoking status: Every Day  Packs/day: 0.25    Years: 5.00    Pack years: 1.25    Types: Cigarettes   Smokeless tobacco: Never   Tobacco comments:    2 cig/day  Vaping Use   Vaping Use: Never used  Substance Use Topics   Alcohol use: Yes    Comment: rarely   Drug use: No    ROS   Objective:   Vitals: BP 124/89 (BP Location: Right Arm)    Pulse 85    Temp 98.9 F (37.2 C) (Oral)    Resp 18    Ht 5' (1.524 m)    Wt 155 lb (70.3 kg)    LMP  (LMP Unknown)    SpO2 99%    BMI 30.27 kg/m   Physical Exam Constitutional:      General: She is not in acute distress.    Appearance: Normal appearance. She is well-developed and normal weight. She is not ill-appearing, toxic-appearing or diaphoretic.  HENT:     Head: Normocephalic  and atraumatic.     Right Ear: Ear canal and external ear normal. No drainage or tenderness. No middle ear effusion. There is no impacted cerumen. Tympanic membrane is not erythematous.     Left Ear: Ear canal and external ear normal. No drainage or tenderness.  No middle ear effusion. There is no impacted cerumen. Tympanic membrane is not erythematous.     Ears:     Comments: TMs opacified bilaterally.    Nose: Nose normal. No congestion or rhinorrhea.     Mouth/Throat:     Mouth: Mucous membranes are moist. No oral lesions.     Pharynx: No pharyngeal swelling, oropharyngeal exudate, posterior oropharyngeal erythema or uvula swelling.     Tonsils: No tonsillar exudate or tonsillar abscesses.  Eyes:     General: No scleral icterus.       Right eye: No discharge.        Left eye: No discharge.     Extraocular Movements: Extraocular movements intact.     Right eye: Normal extraocular motion.     Left eye: Normal extraocular motion.     Conjunctiva/sclera: Conjunctivae normal.  Cardiovascular:     Rate and Rhythm: Normal rate.     Heart sounds: No murmur heard.   No friction rub. No gallop.  Pulmonary:     Effort: Pulmonary effort is normal. No respiratory distress.     Breath sounds: No stridor. No wheezing, rhonchi or rales.  Chest:     Chest wall: No tenderness.  Musculoskeletal:     Cervical back: Normal range of motion and neck supple.  Lymphadenopathy:     Cervical: No cervical adenopathy.  Skin:    General: Skin is warm and dry.  Neurological:     General: No focal deficit present.     Mental Status: She is alert and oriented to person, place, and time.  Psychiatric:        Mood and Affect: Mood normal.        Behavior: Behavior normal.    IMPRESSION: Mildly motion degraded exam.   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 However, given the patient's young age, a 1 year follow-up brain MRI is recommended to ensure  size stability.   Otherwise unremarkable non-contrast MRI appearance of the brain.   Paranasal sinus disease, as described.   Left mastoid effusion.     Electronically Signed   By: Kellie Simmering D.O.  On: 10/05/2021 08:27  Assessment and Plan :   PDMP not reviewed this encounter.  1. Eustachian tube dysfunction, bilateral   2. Allergic rhinitis due to other allergic trigger, unspecified seasonality   3. 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  She is already taking her antihistamines daily.  She does not have any pain or signs of otitis media.  Recommended an oral prednisone course for her sinus inflammation, allergic rhinitis.  Follow-up with ENT. Counseled patient on potential for adverse effects with medications prescribed/recommended today, ER and return-to-clinic precautions discussed, patient verbalized understanding.    Jaynee Eagles, [place] 01/28/22 1922

## 2022-01-28 NOTE — ED Triage Notes (Signed)
Pt reports nausea, bilateral ear pain, sinus pressure, intermittent anxiety x3 weeks.

## 2022-01-29 ENCOUNTER — Ambulatory Visit: Payer: Medicaid Other | Admitting: "Endocrinology

## 2022-01-29 ENCOUNTER — Ambulatory Visit: Payer: Medicaid Other

## 2022-02-04 ENCOUNTER — Ambulatory Visit (INDEPENDENT_AMBULATORY_CARE_PROVIDER_SITE_OTHER): Payer: Medicaid Other | Admitting: Family Medicine

## 2022-02-04 ENCOUNTER — Encounter: Payer: Self-pay | Admitting: Family Medicine

## 2022-02-04 ENCOUNTER — Other Ambulatory Visit: Payer: Self-pay

## 2022-02-04 VITALS — BP 148/94 | HR 96 | Temp 98.1°F | Resp 20 | Ht 60.0 in | Wt 153.2 lb

## 2022-02-04 DIAGNOSIS — J3089 Other allergic rhinitis: Secondary | ICD-10-CM | POA: Diagnosis not present

## 2022-02-04 DIAGNOSIS — J4540 Moderate persistent asthma, uncomplicated: Secondary | ICD-10-CM | POA: Diagnosis not present

## 2022-02-04 DIAGNOSIS — B999 Unspecified infectious disease: Secondary | ICD-10-CM | POA: Diagnosis not present

## 2022-02-04 DIAGNOSIS — L508 Other urticaria: Secondary | ICD-10-CM | POA: Diagnosis not present

## 2022-02-04 DIAGNOSIS — J302 Other seasonal allergic rhinitis: Secondary | ICD-10-CM

## 2022-02-04 DIAGNOSIS — Z720 Tobacco use: Secondary | ICD-10-CM | POA: Insufficient documentation

## 2022-02-04 MED ORDER — MONTELUKAST SODIUM 10 MG PO TABS: 10.0000 mg | ORAL_TABLET | Freq: Every day | ORAL | 5 refills | Status: DC

## 2022-02-04 MED ORDER — BUDESONIDE-FORMOTEROL FUMARATE 160-4.5 MCG/ACT IN AERO: 2.0000 | INHALATION_SPRAY | Freq: Two times a day (BID) | RESPIRATORY_TRACT | 5 refills | Status: DC

## 2022-02-04 NOTE — Patient Instructions (Addendum)
Asthma Begin Symbicort 160-2 puffs twice a day with a spacer to prevent cough or wheeze. This will replace Symbicort 80  Begin montelukast 10 mg once a day to prevent cough and wheeze. Patient cautioned that rarely some children/adults can experience behavioral changes after beginning montelukast. These side effects are rare, however, if you notice any change, notify the clinic and discontinue montelukast.  Continue albuterol 2 puffs every 4 hours as needed for cough or wheeze OR Instead use albuterol 0.083% solution via nebulizer one unit vial every 4 hours as needed for cough or wheeze  Allergic rhinitis Continue allergen avoidance measures directed toward grass pollen, tree pollen, mold, dust mite, cat, dog, and cockroach as listed below Stop cetirizine for the next week, then continue cetirizine 10 mg once a day as needed for a runny nose or itch Begin Flonase 2 sprays in each nostril once a day as needed for a stuffy nose.  In the right nostril, point the applicator out toward the right ear. In the left nostril, point the applicator out toward the left ear Begin saline nasal rinses as needed for nasal symptoms. Use this before any medicated nasal sprays for best result If your symptoms are not well controlled with the treatment plan as listed above, consider allergen immunotherapy.  Hives (urticaria) Use the least amount of medications while remaining hive free Cetirizine (Zyrtec) 10mg  twice a day and famotidine (Pepcid) 20 mg twice a day. If no symptoms for 7-14 days then decrease to Cetirizine (Zyrtec) 10mg  twice a day and famotidine (Pepcid) 20 mg once a day.  If no symptoms for 7-14 days then decrease to Cetirizine (Zyrtec) 10mg  twice a day.  If no symptoms for 7-14 days then decrease to Cetirizine (Zyrtec) 10mg  once a day.  May use Benadryl (diphenhydramine) as needed for breakthrough hives       If symptoms return, then step up dosage  Keep a detailed symptom journal including  foods eaten, contact with allergens, medications taken, weather changes.   Recurrent infections Keep track of infections and antibiotic use Lab orders have been placed to help [name] evaluate your immune system  Tobacco use Try to cut down on cigarettes and best to quit smoking Written information provided from UpToDate  Your blood pressure was elevated at today's visit.  Contact your primary care provider for further management of your blood pressure  Call the clinic if this treatment plan is not working well for you  Follow up in 2 weeks or sooner if needed.  Reducing Pollen Exposure The American Academy of Allergy, Asthma and Immunology suggests the following steps to reduce your exposure to pollen during allergy seasons. Do not hang sheets or clothing out to dry; pollen may collect on these items. Do not mow lawns or spend time around freshly cut grass; mowing stirs up pollen. Keep windows closed at night.  Keep car windows closed while driving. Minimize morning activities outdoors, a time when pollen counts are usually at their highest. Stay indoors as much as possible when pollen counts or humidity is high and on windy days when pollen tends to remain in the air longer. Use air conditioning when possible.  Many air conditioners have filters that trap the pollen spores. Use a HEPA room air filter to remove pollen form the indoor air you breathe.  Control of Mold Allergen Mold and fungi can grow on a variety of surfaces provided certain temperature and moisture conditions exist.  Outdoor molds grow on plants, decaying vegetation and soil.  The major outdoor mold, Alternaria and Cladosporium, are found in very high numbers during hot and dry conditions.  Generally, a late Summer - Fall peak is seen for common outdoor fungal spores.  Rain will temporarily lower outdoor mold spore count, but counts rise rapidly when the rainy period ends.  The most important indoor molds are Aspergillus and  Penicillium.  Dark, humid and poorly ventilated basements are ideal sites for mold growth.  The next most common sites of mold growth are the bathroom and the kitchen.  Outdoor [employer] Use air conditioning and keep windows closed Avoid exposure to decaying vegetation. Avoid leaf raking. Avoid grain handling. Consider wearing a face mask if working in moldy areas.  Indoor Mold Control Maintain humidity below 50%. Clean washable surfaces with 5% bleach solution. Remove sources e.g. Contaminated carpets.   Control of Dust Mite Allergen Dust mites play a major role in allergic asthma and rhinitis. They occur in environments with high humidity wherever human skin is found. Dust mites absorb humidity from the atmosphere (ie, they do not drink) and feed on organic matter (including shed human and animal skin). Dust mites are a microscopic type of insect that you cannot see with the naked eye. High levels of dust mites have been detected from mattresses, pillows, carpets, upholstered furniture, bed covers, clothes, soft toys and any woven material. The principal allergen of the dust mite is found in its feces. A gram of dust may contain 1,000 mites and 250,000 fecal particles. Mite antigen is easily measured in the air during house cleaning activities. Dust mites do not bite and do not cause harm to humans, other than by triggering allergies/asthma.  Ways to decrease your exposure to dust mites in your home:  1. Encase mattresses, box springs and pillows with a mite-impermeable barrier or cover  2. Wash sheets, blankets and drapes weekly in hot water (130 F) with detergent and dry them in a dryer on the hot setting.  3. Have the room cleaned frequently with a vacuum cleaner and a damp dust-mop. For carpeting or rugs, vacuuming with a vacuum cleaner equipped with a high-efficiency particulate air (HEPA) filter. The dust mite allergic individual should not be in a room which is being cleaned and  should wait 1 hour after cleaning before going into the room.  4. Do not sleep on upholstered furniture (eg, couches).  5. If possible removing carpeting, upholstered furniture and drapery from the home is ideal. Horizontal blinds should be eliminated in the rooms where the person spends the most time (bedroom, study, television room). Washable vinyl, roller-type shades are optimal.  6. Remove all non-washable stuffed toys from the bedroom. Wash stuffed toys weekly like sheets and blankets above.  7. Reduce indoor humidity to less than 50%. Inexpensive humidity monitors can be purchased at most hardware stores. Do not use a humidifier as can make the problem worse and are not recommended.  Control of Dog or Cat Allergen Avoidance is the best way to manage a dog or cat allergy. If you have a dog or cat and are allergic to dog or cats, consider removing the dog or cat from the home. If you have a dog or cat but dont want to find it a new home, or if your family wants a pet even though someone in the household is allergic, here are some strategies that may help keep symptoms at bay:  Keep the pet out of your bedroom and restrict it to only a few  rooms. Be advised that keeping the dog or cat in only one room will not limit the allergens to that room. Dont pet, hug or kiss the dog or cat; if you do, wash your hands with soap and water. High-efficiency particulate air (HEPA) cleaners run continuously in a bedroom or living room can reduce allergen levels over time. Regular use of a high-efficiency vacuum cleaner or a central vacuum can reduce allergen levels. Giving your dog or cat a bath at least once a week can reduce airborne allergen.  Control of Cockroach Allergen Cockroach allergen has been identified as an important cause of acute attacks of asthma, especially in urban settings.  There are fifty-five species of cockroach that exist in the [place], however only three, the [place],  [place] species produce allergen that can affect patients with Asthma.  Allergens can be obtained from fecal particles, egg casings and secretions from cockroaches.    Remove food sources. Reduce access to water. Seal access and entry points. Spray runways with 0.5-1% Diazinon or Chlorpyrifos Blow boric acid power under stoves and refrigerator. Place bait stations (hydramethylnon) at feeding sites.
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
## 2022-02-08 ENCOUNTER — Telehealth: Payer: Self-pay | Admitting: *Deleted

## 2022-02-08 ENCOUNTER — Encounter: Payer: Self-pay | Admitting: Family Medicine

## 2022-02-08 NOTE — Telephone Encounter (Signed)
No we do not do that at all.   Peggy Marvel, MD [facility]

## 2022-02-08 NOTE — Telephone Encounter (Signed)
Called and left a voicemail asking for patient to return call to see how she is doing.  

## 2022-02-08 NOTE — Telephone Encounter (Signed)
Patient called back and stated that she is not feeling any better that she feels the same. She was also inquiring about a blood test for mold exposure, I advised that I did not believe we did that. Is that correct?

## 2022-02-08 NOTE — Telephone Encounter (Signed)
-----   Message from Valentina Shaggy, MD sent at 02/08/2022  5:31 AM EST ----- Can someone call this patient today to see how she is feeling?

## 2022-02-11 NOTE — Telephone Encounter (Signed)
Patient called back and I advised of what Dr. Ernst Bowler recommended. She asked what is she supposed to do? I asked if she started the the Symbicort and Montelukast and Zyrtec and Pepcid and she confirmed and stated that there was not much different. I was informed from our office staff that she was offered to move to a smaller apartment until a 3 bedroom apartment became available but she declined.

## 2022-02-11 NOTE — Telephone Encounter (Signed)
Spoke with patient, informed her of Dr. Gillermina Hu recommendation. Patient verbalized understanding.

## 2022-02-11 NOTE — Telephone Encounter (Signed)
I would get a dehumidifier and HEPA filter.  The dehumidifier will make it less amenable to mold growth and the HEPA filter will capture molds spores.  Salvatore Marvel, MD [facility]

## 2022-02-11 NOTE — Telephone Encounter (Signed)
Called and left a voicemail asking for patient to return call to discuss.  °

## 2022-02-21 ENCOUNTER — Institutional Professional Consult (permissible substitution): Payer: Medicaid Other | Admitting: Pulmonary Disease
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
## 2022-03-11 ENCOUNTER — Ambulatory Visit
Admission: EM | Admit: 2022-03-11 | Discharge: 2022-03-11 | Disposition: A | Discharge status: Home / Self Care | Payer: Medicaid Other | Attending: Urgent Care | Admitting: Urgent Care

## 2022-03-11 ENCOUNTER — Other Ambulatory Visit: Payer: Self-pay

## 2022-03-11 DIAGNOSIS — Z20818 Contact with and (suspected) exposure to other bacterial communicable diseases: Secondary | ICD-10-CM

## 2022-03-11 DIAGNOSIS — J029 Acute pharyngitis, unspecified: Secondary | ICD-10-CM | POA: Diagnosis not present

## 2022-03-11 DIAGNOSIS — R070 Pain in throat: Secondary | ICD-10-CM

## 2022-03-11 MED ORDER — AMOXICILLIN 500 MG PO CAPS: 500.0000 mg | ORAL_CAPSULE | Freq: Two times a day (BID) | ORAL | 0 refills | Status: DC

## 2022-03-11 NOTE — ED Provider Notes (Signed)
?[place] ? ? ?MRN: [id number] DOB: June 06, 1991 ? ?Subjective:  ? ?Peggy [name] is a 31 y.o. female presenting for 2-day history of throat pain, upset stomach. Patient did bring in 2 of her children earlier today and both tested positive for strep throat.  Has 2 other children that are also being seen for similar symptoms in addition to the 2 she already brought. ? ?No current facility-administered medications for this encounter. ? ?Current Outpatient Medications:  ?  AIMOVIG 70 MG/ML SOAJ, Inject 70 mg into the skin every 30 (thirty) days., Disp: , Rfl:  ?  albuterol (PROAIR HFA) 108 (90 Base) MCG/ACT inhaler, TAKE 2 PUFFS BY MOUTH EVERY 4- 6 HOURS AS NEEDED FOR WHEEZE OR SHORTNESS OF BREATH, Disp: 1 each, Rfl: 1 ?  albuterol (PROVENTIL) (2.5 MG/3ML) 0.083% nebulizer solution, Take 3 mLs (2.5 mg total) by nebulization every 6 (six) hours as needed for wheezing or shortness of breath., Disp: 75 mL, Rfl: 1 ?  budesonide-formoterol (SYMBICORT) 160-4.5 MCG/ACT inhaler, Inhale 2 puffs into the lungs 2 (two) times daily., Disp: 1 each, Rfl: 5 ?  cetirizine (ZYRTEC) 10 MG tablet, Take 2 tablets (20 mg total) by mouth 2 (two) times daily. (Patient taking differently: Take 10 mg by mouth 2 (two) times daily.), Disp: 120 tablet, Rfl: 5 ?  Cholecalciferol (VITAMIN D3 PO), Take by mouth., Disp: , Rfl:  ?  famotidine (PEPCID) 20 MG tablet, TAKE 1 TABLET BY MOUTH TWICE A DAY, Disp: 60 tablet, Rfl: 2 ?  levocetirizine (XYZAL) 5 MG tablet, Take 1 tablet (5 mg total) by mouth every evening., Disp: 90 tablet, Rfl: 0 ?  levothyroxine (SYNTHROID) 150 MCG tablet, Take 1 tablet (150 mcg total) by mouth daily., Disp: 90 tablet, Rfl: 1 ?  medroxyPROGESTERone (DEPO-PROVERA) 150 MG/ML injection, Inject 1 mL (150 mg total) into the muscle every 3 (three) months., Disp: 1 mL, Rfl: 3 ?  metoprolol tartrate (LOPRESSOR) 25 MG tablet, Take 0.5 tablets (12.5 mg total) by mouth 2 (two) times daily., Disp: 90 tablet, Rfl:  1 ?  montelukast (SINGULAIR) 10 MG tablet, Take 1 tablet (10 mg total) by mouth at bedtime., Disp: 30 tablet, Rfl: 5  ? ?Allergies  ?Allergen Reactions  ? Metronidazole Nausea Only  ?  Patient requests gel form for all future treatment of BV  ? ? ?Past Medical History:  ?Diagnosis Date  ? Abnormal Pap smear of cervix 06/22/2020  ? 05/2020 pap LSIL negative HPV and GC/CHL  As per ASCCP guidelines repeat in 1 year, 5 year risk of CIN 3 + is 2%  ? Asthma   ? Gestational diabetes   ? Pyloric stenosis   ? Thyroid disease   ? Graves  ? Urticaria   ?  ? ?Past Surgical History:  ?Procedure Laterality Date  ? PYLOROMYOTOMY    ? ? ?Family History  ?Problem Relation Age of Onset  ? Arthritis Mother   ? Cancer Mother   ?     thyroid  ? Depression Mother   ? Hyperlipidemia Father   ? Asthma Father   ? Hypertension Father   ? Heart disease Father   ? COPD Maternal Grandfather   ? Diabetes Paternal Grandmother   ? Arthritis Paternal Grandmother   ? Asthma Paternal Grandmother   ? Heart disease Paternal Grandmother   ? Depression Paternal Grandmother   ? Hypertension Paternal Grandmother   ? Hyperlipidemia Paternal Grandmother   ? Other Neg Hx   ? ? ?Social History  ? ?  Tobacco Use  ? Smoking status: Every Day  ?  Packs/day: 0.25  ?  Years: 5.00  ?  Pack years: 1.25  ?  Types: Cigarettes  ? Smokeless tobacco: Never  ? Tobacco comments:  ?  2 cig/day  ?Vaping Use  ? Vaping Use: Never used  ?Substance Use Topics  ? Alcohol use: Yes  ?  Comment: rarely  ? Drug use: No  ? ? ?ROS ? ? ?Objective:  ? ?Vitals: ?BP 122/78 (BP Location: Right Arm)   Pulse 83   Temp 99.4 ?F (37.4 ?C) (Oral)   Resp 18   SpO2 96%  ? ?Physical Exam ?Constitutional:   ?   General: She is not in acute distress. ?   Appearance: Normal appearance. She is well-developed. She is not ill-appearing, toxic-appearing or diaphoretic.  ?HENT:  ?   Head: Normocephalic and atraumatic.  ?   Nose: Nose normal.  ?   Mouth/Throat:  ?   Mouth: Mucous membranes are moist.  ?    Pharynx: Oropharynx is clear. No pharyngeal swelling, oropharyngeal exudate, posterior oropharyngeal erythema or uvula swelling.  ?   Tonsils: No tonsillar exudate or tonsillar abscesses. 0 on the right. 0 on the left.  ?Eyes:  ?   General: No scleral icterus.    ?   Right eye: No discharge.     ?   Left eye: No discharge.  ?   Extraocular Movements: Extraocular movements intact.  ?   Conjunctiva/sclera: Conjunctivae normal.  ?Cardiovascular:  ?   Rate and Rhythm: Normal rate.  ?Pulmonary:  ?   Effort: Pulmonary effort is normal.  ?Abdominal:  ?   General: Bowel sounds are normal. There is no distension.  ?   Palpations: Abdomen is soft. There is no mass.  ?   Tenderness: There is no abdominal tenderness. There is no right CVA tenderness, left CVA tenderness, guarding or rebound.  ?Skin: ?   General: Skin is warm and dry.  ?Neurological:  ?   General: No focal deficit present.  ?   Mental Status: She is alert and oriented to person, place, and time.  ?Psychiatric:     ?   Mood and Affect: Mood normal.     ?   Behavior: Behavior normal.     ?   Thought Content: Thought content normal.     ?   Judgment: Judgment normal.  ? ? ?Assessment and Plan :  ? ?PDMP not reviewed this encounter. ? ?1. Acute pharyngitis, unspecified etiology   ?2. Throat pain   ?3. Streptococcus exposure   ? ? Start amoxicillin given physical exam and exposure to strep throat. Use supportive care otherwise. Counseled patient on potential for adverse effects with medications prescribed/recommended today, ER and return-to-clinic precautions discussed, patient verbalized understanding. ? ?  ?Jaynee Eagles, PA-C ?03/11/22 1657 ? ?

## 2022-03-11 NOTE — ED Triage Notes (Signed)
Pt reports upset stomach and sore throat x 2 days. Daughter tested positive for Strep today.  ?

## 2022-03-12 ENCOUNTER — Institutional Professional Consult (permissible substitution): Payer: Medicaid Other | Admitting: Pulmonary Disease

## 2022-03-12 NOTE — Progress Notes (Deleted)
? ?Synopsis: Referred in March 2023 for mold exposure by Launa Grill, PA ? ?Subjective:  ? ?PATIENT ID: Peggy [name] GENDER: female DOB: [birth year], MRN: [id number] ? ? ?HPI ? ?No chief complaint on file. ? ?Peggy [name] is a 31 year old woman, daily smoker with history of asthma and hypothyroidism who is referred to pulmonary clinic for mold exposure.  ? ?She was seen yesterday in an Urgent Care for strep throat as she has positive exposures from her children. She was started on amoxicillin.  ? ?She was seen 02/04/22 at the Allergy Asthma clinic where she was prescribed symbicort 160-4.60mg and montelukast '10mg'$  daily. Spirometry did not indicate obstruction.  ? ?Past Medical History:  ?Diagnosis Date  ? Abnormal Pap smear of cervix 06/22/2020  ? 05/2020 pap LSIL negative HPV and GC/CHL  As per ASCCP guidelines repeat in 1 year, 5 year risk of CIN 3 + is 2%  ? Asthma   ? Gestational diabetes   ? Pyloric stenosis   ? Thyroid disease   ? Graves  ? Urticaria   ?  ? ?Family History  ?Problem Relation Age of Onset  ? Arthritis Mother   ? Cancer Mother   ?     thyroid  ? Depression Mother   ? Hyperlipidemia Father   ? Asthma Father   ? Hypertension Father   ? Heart disease Father   ? COPD Maternal Grandfather   ? Diabetes Paternal Grandmother   ? Arthritis Paternal Grandmother   ? Asthma Paternal Grandmother   ? Heart disease Paternal Grandmother   ? Depression Paternal Grandmother   ? Hypertension Paternal Grandmother   ? Hyperlipidemia Paternal Grandmother   ? Other Neg Hx   ?  ? ?Social History  ? ?Socioeconomic History  ? Marital status: Divorced  ?  Spouse name: DItati Brocksmith ? Number of children: 2  ? Years of education: Not on file  ? Highest education level: Some college, no degree  ?Occupational History  ? Not on file  ?Tobacco Use  ? Smoking status: Every Day  ?  Packs/day: 0.25  ?  Years: 5.00  ?  Pack years: 1.25  ?  Types: Cigarettes  ? Smokeless tobacco: Never  ? Tobacco comments:  ?  2 cig/day   ?Vaping Use  ? Vaping Use: Never used  ?Substance and Sexual Activity  ? Alcohol use: Yes  ?  Comment: rarely  ? Drug use: No  ? Sexual activity: Yes  ?  Birth control/protection: Injection  ?Other Topics Concern  ? Not on file  ?Social History Narrative  ? Not on file  ? ?Social Determinants of Health  ? ?Financial Resource Strain: Low Risk   ? Difficulty of Paying Living Expenses: Not hard at all  ?Food Insecurity: No Food Insecurity  ? Worried About RCharity fundraiserin the Last Year: Never true  ? Ran Out of Food in the Last Year: Never true  ?Transportation Needs: No Transportation Needs  ? Lack of Transportation (Medical): No  ? Lack of Transportation (Non-Medical): No  ?Physical Activity: Sufficiently Active  ? Days of Exercise per Week: 5 days  ? Minutes of Exercise per Session: 60 min  ?Stress: Stress Concern Present  ? Feeling of Stress : Very much  ?Social Connections: Moderately Isolated  ? Frequency of Communication with Friends and Family: Twice a week  ? Frequency of Social Gatherings with Friends and Family: Once a week  ? Attends Religious Services: 1 to  4 times per year  ? Active Member of Clubs or Organizations: No  ? Attends Archivist Meetings: Never  ? Marital Status: Divorced  ?Intimate Partner Violence: Not At Risk  ? Fear of Current or Ex-Partner: No  ? Emotionally Abused: No  ? Physically Abused: No  ? Sexually Abused: No  ?  ? ?Allergies  ?Allergen Reactions  ? Metronidazole Nausea Only  ?  Patient requests gel form for all future treatment of BV  ?  ? ?Outpatient Medications Prior to Visit  ?Medication Sig Dispense Refill  ? AIMOVIG 70 MG/ML SOAJ Inject 70 mg into the skin every 30 (thirty) days.    ? albuterol (PROAIR HFA) 108 (90 Base) MCG/ACT inhaler TAKE 2 PUFFS BY MOUTH EVERY 4- 6 HOURS AS NEEDED FOR WHEEZE OR SHORTNESS OF BREATH 1 each 1  ? albuterol (PROVENTIL) (2.5 MG/3ML) 0.083% nebulizer solution Take 3 mLs (2.5 mg total) by nebulization every 6 (six) hours as  needed for wheezing or shortness of breath. 75 mL 1  ? amoxicillin (AMOXIL) 500 MG capsule Take 1 capsule (500 mg total) by mouth 2 (two) times daily. 20 capsule 0  ? budesonide-formoterol (SYMBICORT) 160-4.5 MCG/ACT inhaler Inhale 2 puffs into the lungs 2 (two) times daily. 1 each 5  ? cetirizine (ZYRTEC) 10 MG tablet Take 2 tablets (20 mg total) by mouth 2 (two) times daily. (Patient taking differently: Take 10 mg by mouth 2 (two) times daily.) 120 tablet 5  ? Cholecalciferol (VITAMIN D3 PO) Take by mouth.    ? famotidine (PEPCID) 20 MG tablet TAKE 1 TABLET BY MOUTH TWICE A DAY 60 tablet 2  ? levocetirizine (XYZAL) 5 MG tablet Take 1 tablet (5 mg total) by mouth every evening. 90 tablet 0  ? levothyroxine (SYNTHROID) 150 MCG tablet Take 1 tablet (150 mcg total) by mouth daily. 90 tablet 1  ? medroxyPROGESTERone (DEPO-PROVERA) 150 MG/ML injection Inject 1 mL (150 mg total) into the muscle every 3 (three) months. 1 mL 3  ? metoprolol tartrate (LOPRESSOR) 25 MG tablet Take 0.5 tablets (12.5 mg total) by mouth 2 (two) times daily. 90 tablet 1  ? montelukast (SINGULAIR) 10 MG tablet Take 1 tablet (10 mg total) by mouth at bedtime. 30 tablet 5  ? ?No facility-administered medications prior to visit.  ? ? ?Review of Systems  ?Constitutional:  Negative for chills, fever, malaise/fatigue and weight loss.  ?HENT:  Negative for congestion, sinus pain and sore throat.   ?Eyes: Negative.   ?Respiratory:  Negative for cough, hemoptysis, sputum production, shortness of breath and wheezing.   ?Cardiovascular:  Negative for chest pain, palpitations, orthopnea, claudication and leg swelling.  ?Gastrointestinal:  Negative for abdominal pain, heartburn, nausea and vomiting.  ?Genitourinary: Negative.   ?Musculoskeletal:  Negative for joint pain and myalgias.  ?Skin:  Negative for rash.  ?Neurological:  Negative for weakness.  ?Endo/Heme/Allergies: Negative.   ?Psychiatric/Behavioral: Negative.    ? ? ? ?Objective:  ?There were no  vitals filed for this visit. ? ? ?Physical Exam ?Constitutional:   ?   General: She is not in acute distress. ?   Appearance: She is not ill-appearing.  ?HENT:  ?   Head: Normocephalic and atraumatic.  ?Eyes:  ?   General: No scleral icterus. ?   Conjunctiva/sclera: Conjunctivae normal.  ?   Pupils: Pupils are equal, round, and reactive to light.  ?Cardiovascular:  ?   Rate and Rhythm: Normal rate and regular rhythm.  ?   Pulses: Normal  pulses.  ?   Heart sounds: Normal heart sounds. No murmur heard. ?Pulmonary:  ?   Effort: Pulmonary effort is normal.  ?   Breath sounds: Normal breath sounds. No wheezing, rhonchi or rales.  ?Abdominal:  ?   General: Bowel sounds are normal.  ?   Palpations: Abdomen is soft.  ?Musculoskeletal:  ?   Right lower leg: No edema.  ?   Left lower leg: No edema.  ?Lymphadenopathy:  ?   Cervical: No cervical adenopathy.  ?Skin: ?   General: Skin is warm and dry.  ?Neurological:  ?   General: No focal deficit present.  ?   Mental Status: She is alert.  ?Psychiatric:     ?   Mood and Affect: Mood normal.     ?   Behavior: Behavior normal.     ?   Thought Content: Thought content normal.     ?   Judgment: Judgment normal.  ? ? ?CBC ?   ?Component Value Date/Time  ? WBC 11.4 (H) 10/15/2021 0805  ? RBC 4.41 10/15/2021 0805  ? HGB 13.1 10/15/2021 0805  ? HGB 11.8 01/28/2019 0844  ? HCT 41.0 10/15/2021 0805  ? HCT 34.8 01/28/2019 0844  ? PLT 337 10/15/2021 0805  ? PLT 328 01/28/2019 0844  ? MCV 93.0 10/15/2021 0805  ? MCV 89 01/28/2019 0844  ? MCH 29.7 10/15/2021 0805  ? MCHC 32.0 10/15/2021 0805  ? RDW 13.9 10/15/2021 0805  ? RDW 12.7 01/28/2019 0844  ? LYMPHSABS 2.6 10/15/2021 0805  ? LYMPHSABS 2.7 09/25/2018 0847  ? MONOABS 0.6 10/15/2021 0805  ? EOSABS 0.7 (H) 10/15/2021 0805  ? EOSABS 0.3 09/25/2018 0847  ? BASOSABS 0.1 10/15/2021 0805  ? BASOSABS 0.0 09/25/2018 0847  ? ?BMP Latest Ref Rng & Units 10/15/2021 06/22/2021 08/27/2020  ?Glucose 70 - 99 mg/dL 105(H) 89 86  ?BUN 6 - 20 mg/dL '15 13 15   '$ ?Creatinine 0.44 - 1.00 mg/dL 0.76 0.74 0.86  ?Sodium 135 - 145 mmol/L 138 135 139  ?Potassium 3.5 - 5.1 mmol/L 4.0 4.1 3.9  ?Chloride 98 - 111 mmol/L 105 106 106  ?CO2 22 - 32 mmol/L '27 22 23  '$ ?Calcium 8.9 -

## 2022-03-15 DIAGNOSIS — J343 Hypertrophy of nasal turbinates: Secondary | ICD-10-CM | POA: Diagnosis not present

## 2022-03-15 DIAGNOSIS — J310 Chronic rhinitis: Secondary | ICD-10-CM | POA: Diagnosis not present

## 2022-03-15 DIAGNOSIS — H6983 Other specified disorders of Eustachian tube, bilateral: Secondary | ICD-10-CM | POA: Diagnosis not present

## 2022-03-15 DIAGNOSIS — H903 Sensorineural hearing loss, bilateral: Secondary | ICD-10-CM | POA: Diagnosis not present

## 2022-03-15 DIAGNOSIS — J342 Deviated nasal septum: Secondary | ICD-10-CM | POA: Diagnosis not present

## 2022-03-28 ENCOUNTER — Institutional Professional Consult (permissible substitution): Payer: Medicaid Other | Admitting: Emergency Medicine

## 2022-04-09 ENCOUNTER — Encounter (HOSPITAL_COMMUNITY): Payer: Self-pay | Admitting: *Deleted

## 2022-04-09 ENCOUNTER — Telehealth: Payer: Self-pay | Admitting: Pulmonary Disease

## 2022-04-09 ENCOUNTER — Emergency Department (HOSPITAL_COMMUNITY)
Admission: EM | Admit: 2022-04-09 | Discharge: 2022-04-09 | Discharge status: Against Medical Advice | Payer: Medicaid Other | Attending: Emergency Medicine | Admitting: Emergency Medicine

## 2022-04-09 ENCOUNTER — Emergency Department (HOSPITAL_COMMUNITY): Payer: Medicaid Other

## 2022-04-09 DIAGNOSIS — R590 Localized enlarged lymph nodes: Secondary | ICD-10-CM | POA: Diagnosis not present

## 2022-04-09 DIAGNOSIS — J168 Pneumonia due to other specified infectious organisms: Secondary | ICD-10-CM | POA: Diagnosis not present

## 2022-04-09 DIAGNOSIS — R0602 Shortness of breath: Secondary | ICD-10-CM | POA: Diagnosis not present

## 2022-04-09 DIAGNOSIS — Z7951 Long term (current) use of inhaled steroids: Secondary | ICD-10-CM | POA: Insufficient documentation

## 2022-04-09 DIAGNOSIS — J45909 Unspecified asthma, uncomplicated: Secondary | ICD-10-CM | POA: Diagnosis not present

## 2022-04-09 DIAGNOSIS — J4541 Moderate persistent asthma with (acute) exacerbation: Secondary | ICD-10-CM | POA: Diagnosis not present

## 2022-04-09 DIAGNOSIS — R0789 Other chest pain: Secondary | ICD-10-CM | POA: Diagnosis not present

## 2022-04-09 DIAGNOSIS — R059 Cough, unspecified: Secondary | ICD-10-CM | POA: Diagnosis not present

## 2022-04-09 DIAGNOSIS — J189 Pneumonia, unspecified organism: Secondary | ICD-10-CM

## 2022-04-09 DIAGNOSIS — R079 Chest pain, unspecified: Secondary | ICD-10-CM | POA: Diagnosis not present

## 2022-04-09 DIAGNOSIS — J181 Lobar pneumonia, unspecified organism: Secondary | ICD-10-CM | POA: Insufficient documentation

## 2022-04-09 DIAGNOSIS — J9809 Other diseases of bronchus, not elsewhere classified: Secondary | ICD-10-CM | POA: Diagnosis not present

## 2022-04-09 DIAGNOSIS — J984 Other disorders of lung: Secondary | ICD-10-CM | POA: Diagnosis not present

## 2022-04-09 DIAGNOSIS — J9811 Atelectasis: Secondary | ICD-10-CM | POA: Diagnosis not present

## 2022-04-09 LAB — CBC WITH DIFFERENTIAL/PLATELET
Abs Immature Granulocytes: 0.05 10*3/uL (ref 0.00–0.07)
Basophils Absolute: 0.1 10*3/uL (ref 0.0–0.1)
Basophils Relative: 1 %
Eosinophils Absolute: 0.5 10*3/uL (ref 0.0–0.5)
Eosinophils Relative: 5 %
HCT: 41.9 % (ref 36.0–46.0)
Hemoglobin: 13.7 g/dL (ref 12.0–15.0)
Immature Granulocytes: 1 %
Lymphocytes Relative: 27 %
Lymphs Abs: 2.7 10*3/uL (ref 0.7–4.0)
MCH: 30.7 pg (ref 26.0–34.0)
MCHC: 32.7 g/dL (ref 30.0–36.0)
MCV: 93.9 fL (ref 80.0–100.0)
Monocytes Absolute: 0.6 10*3/uL (ref 0.1–1.0)
Monocytes Relative: 6 %
Neutro Abs: 6 10*3/uL (ref 1.7–7.7)
Neutrophils Relative %: 60 %
Platelets: 352 10*3/uL (ref 150–400)
RBC: 4.46 MIL/uL (ref 3.87–5.11)
RDW: 13.6 % (ref 11.5–15.5)
WBC: 9.9 10*3/uL (ref 4.0–10.5)
nRBC: 0 % (ref 0.0–0.2)

## 2022-04-09 LAB — BASIC METABOLIC PANEL
Anion gap: 7 (ref 5–15)
BUN: 13 mg/dL (ref 6–20)
CO2: 24 mmol/L (ref 22–32)
Calcium: 9 mg/dL (ref 8.9–10.3)
Chloride: 107 mmol/L (ref 98–111)
Creatinine, Ser: 0.75 mg/dL (ref 0.44–1.00)
GFR, Estimated: >60 mL/min (ref >60)
Glucose, Bld: 86 mg/dL (ref 70–99)
Potassium: 3.8 mmol/L (ref 3.5–5.1)
Sodium: 138 mmol/L (ref 135–145)

## 2022-04-09 LAB — D-DIMER, QUANTITATIVE: D-Dimer, Quant: <0.27 ug/mL-FEU (ref 0.00–0.50)

## 2022-04-09 LAB — HCG, SERUM, QUALITATIVE: Preg, Serum: NEGATIVE

## 2022-04-09 MED ORDER — IPRATROPIUM-ALBUTEROL 0.5-2.5 (3) MG/3ML IN SOLN
3.0000 mL | Freq: Once | RESPIRATORY_TRACT | Status: AC
  Administered 2022-04-09: 3 mL via RESPIRATORY_TRACT
  Filled 2022-04-09: qty 3

## 2022-04-09 MED ORDER — AMOXICILLIN-POT CLAVULANATE 875-125 MG PO TABS: 1.0000 | ORAL_TABLET | Freq: Two times a day (BID) | ORAL | 0 refills | Status: DC

## 2022-04-09 MED ORDER — METHYLPREDNISOLONE SODIUM SUCC 125 MG IJ SOLR
125.0000 mg | Freq: Once | INTRAMUSCULAR | Status: AC
Start: 2022-04-09 — End: 2022-04-09
  Administered 2022-04-09: 125 mg via INTRAVENOUS
  Filled 2022-04-09: qty 2

## 2022-04-09 MED ORDER — IOHEXOL 300 MG/ML  SOLN
100.0000 mL | Freq: Once | INTRAMUSCULAR | Status: AC | PRN
  Administered 2022-04-09: 75 mL via INTRAVENOUS

## 2022-04-09 MED ORDER — SODIUM CHLORIDE 0.9 % IV SOLN
1.0000 g | Freq: Once | INTRAVENOUS | Status: AC
  Administered 2022-04-09: 1 g via INTRAVENOUS
  Filled 2022-04-09 (×2): qty 10

## 2022-04-09 MED ORDER — ALBUTEROL SULFATE (2.5 MG/3ML) 0.083% IN NEBU
2.5000 mg | INHALATION_SOLUTION | Freq: Once | RESPIRATORY_TRACT | Status: AC
  Administered 2022-04-09: 2.5 mg via RESPIRATORY_TRACT
  Filled 2022-04-09: qty 3

## 2022-04-09 NOTE — ED Provider Notes (Signed)
?[facility] ?Provider Note ? ? ?CSN: [id number] ?Arrival date & time: 04/09/22  1646 ? ?  ? ?History ? ?Chief Complaint  ?Patient presents with  ? Chest Pain  ? Cough  ? ? ?Peggy [name] is a 31 y.o. female with a history significant for asthma, Graves' disease, frequent sinus infections presenting for evaluation of cough, chest pain and hemoptysis.  She describes developing nasal congestion 4 days ago, stating she has frequent sinus infections and is under the care of of a ENT specialist.  Her nasal congestion has been clear but thick.  She was in the shower yesterday morning when she developed some nasal drainage along with postnasal drip.  She cleared her throat to bring up the sputum and states it was fully bloody.  She denies seeing any blood with nasal secretions however.  She endorses shortness of breath and right-sided midsternal chest pressure since the symptoms began.  She has had a cough which has been nonproductive.  She also reports significant expiratory wheezing and shortness of breath.  No nausea or vomiting, no diaphoresis, no fevers.  She did use her albuterol neb treatment yesterday without significant improvement in her symptoms.  She denies lower extremity edema or pain.  No recent long periods of sedation.  She is on Depo for birth control. ? ?The history is provided by the patient.  ? ?  ? ?Home Medications ?Prior to Admission medications   ?Medication Sig Start Date End Date Taking? Authorizing Provider  ?AIMOVIG 70 MG/ML SOAJ Inject 70 mg into the skin every 30 (thirty) days. 01/24/22  Yes [provider]  ?albuterol (PROVENTIL) (2.5 MG/3ML) 0.083% nebulizer solution Take 3 mLs (2.5 mg total) by nebulization every 6 (six) hours as needed for wheezing or shortness of breath. 06/22/21  Yes Lorin Glass, PA-C  ?amoxicillin-clavulanate (AUGMENTIN) 875-125 MG tablet Take 1 tablet by mouth every 12 (twelve) hours. 04/09/22  Yes Mallie Giambra, Almyra Free, PA-C   ?budesonide-formoterol (SYMBICORT) 160-4.5 MCG/ACT inhaler Inhale 2 puffs into the lungs 2 (two) times daily. 02/04/22  Yes Ambs, Kathrine Cords, FNP  ?cetirizine (ZYRTEC) 10 MG tablet Take 2 tablets (20 mg total) by mouth 2 (two) times daily. ?Patient taking differently: Take 10 mg by mouth 2 (two) times daily. 10/11/20  Yes Valentina Shaggy, MD  ?Cholecalciferol (VITAMIN D3 PO) Take by mouth.   Yes [provider]  ?famotidine (PEPCID) 20 MG tablet TAKE 1 TABLET BY MOUTH TWICE A DAY 06/22/21  Yes Valentina Shaggy, MD  ?fluticasone [facility]) 50 MCG/ACT nasal spray Place 1-2 sprays into both nostrils daily. 03/19/22  Yes [provider]  ?levocetirizine (XYZAL) 5 MG tablet Take 1 tablet (5 mg total) by mouth every evening. 01/28/22  Yes Jaynee Eagles, PA-C  ?levothyroxine (SYNTHROID) 150 MCG tablet Take 1 tablet (150 mcg total) by mouth daily. 10/19/21  Yes Cassandria Anger, MD  ?medroxyPROGESTERone (DEPO-PROVERA) 150 MG/ML injection Inject 1 mL (150 mg total) into the muscle every 3 (three) months. 07/31/21  Yes Roma Schanz, CNM  ?metoprolol tartrate (LOPRESSOR) 25 MG tablet Take 0.5 tablets (12.5 mg total) by mouth 2 (two) times daily. 10/15/21  Yes BranchAlphonse Guild, MD  ?montelukast (SINGULAIR) 10 MG tablet Take 1 tablet (10 mg total) by mouth at bedtime. 02/04/22  Yes Ambs, Kathrine Cords, FNP  ?albuterol (PROAIR HFA) 108 (90 Base) MCG/ACT inhaler TAKE 2 PUFFS BY MOUTH EVERY 4- 6 HOURS AS NEEDED FOR WHEEZE OR SHORTNESS OF BREATH 06/22/21   Phylliss Bob,  Ree Shay, PA-C  ?   ? ?Allergies    ?Metronidazole   ? ?Review of Systems   ?Review of Systems  ?Constitutional:  Negative for chills and fever.  ?Eyes: Negative.   ?Cardiovascular:  Negative for chest pain, palpitations and leg swelling.  ?Gastrointestinal:  Negative for abdominal pain, nausea and vomiting.  ?Genitourinary: Negative.   ?Musculoskeletal:  Negative for arthralgias, joint swelling and neck pain.  ?Skin: Negative.  Negative for rash and  wound.  ?Neurological:  Negative for dizziness, weakness, light-headedness, numbness and headaches.  ?Psychiatric/Behavioral: Negative.    ?All other systems reviewed and are negative. ? ?Physical Exam ?Updated Vital Signs ?BP 117/84   Pulse 69   Temp 99.3 ?F (37.4 ?C) (Oral)   Resp (!) 23   Ht 5' (1.524 m)   Wt 70.3 kg   SpO2 96%   BMI 30.27 kg/m?  ?Physical Exam ?Vitals and nursing note reviewed.  ?Constitutional:   ?   Appearance: She is well-developed.  ?HENT:  ?   Head: Normocephalic and atraumatic.  ?Eyes:  ?   Conjunctiva/sclera: Conjunctivae normal.  ?Cardiovascular:  ?   Rate and Rhythm: Normal rate and regular rhythm.  ?   Heart sounds: Normal heart sounds.  ?Pulmonary:  ?   Effort: Pulmonary effort is normal.  ?   Breath sounds: Examination of the right-upper field reveals decreased breath sounds. Examination of the right-middle field reveals decreased breath sounds and wheezing. Examination of the right-lower field reveals decreased breath sounds and wheezing. Examination of the left-lower field reveals wheezing. Decreased breath sounds and wheezing present.  ?Chest:  ?   Chest wall: No deformity, tenderness or crepitus.  ?Abdominal:  ?   General: Bowel sounds are normal.  ?   Palpations: Abdomen is soft.  ?   Tenderness: There is no abdominal tenderness.  ?Musculoskeletal:     ?   General: Normal range of motion.  ?   Cervical back: Normal range of motion.  ?Skin: ?   General: Skin is warm and dry.  ?Neurological:  ?   Mental Status: She is alert.  ? ? ?ED Results / Procedures / Treatments   ?Labs ?(all labs ordered are listed, but only abnormal results are displayed) ?Labs Reviewed  ?CBC WITH DIFFERENTIAL/PLATELET  ?BASIC METABOLIC PANEL  ?D-DIMER, QUANTITATIVE  ?HCG, SERUM, QUALITATIVE  ? ? ?EKG ?None ? ?Radiology ?DG Chest 2 View ? ?Result Date: 04/09/2022 ?CLINICAL DATA:  Cough and chest pain and shortness of breath EXAM: CHEST - 2 VIEW COMPARISON:  12/18/2021 FINDINGS: Right upper lung  paratracheal triangular opacity compatible with right upper lobe collapse. Difficult to exclude central airway obstruction. Clear left lung. No edema pattern, effusion, or pneumothorax. Normal heart size and vascularity. No acute osseous finding. IMPRESSION: Right upper lobe collapse.  See above comment. Electronically Signed   By: Jerilynn Mages.  Shick M.D.   On: 04/09/2022 18:15  ? ?CT Chest W Contrast ? ?Result Date: 04/09/2022 ?CLINICAL DATA:  Pneumonia, complication suspected, xray done Patient reports cough and chest pain for 3 days. EXAM: CT CHEST WITH CONTRAST TECHNIQUE: Multidetector CT imaging of the chest was performed during intravenous contrast administration. RADIATION DOSE REDUCTION: This exam was performed according to the departmental dose-optimization program which includes automated exposure control, adjustment of the mA and/or kV according to patient size and/or use of iterative reconstruction technique. CONTRAST:  68m OMNIPAQUE IOHEXOL 300 MG/ML  SOLN COMPARISON:  Radiograph earlier today.  Chest CT 06/04/2016 FINDINGS: Cardiovascular: Normal caliber thoracic aorta. The  heart is normal in size. No pericardial effusion. Mediastinum/Nodes: Assessment of the right hilum is partially obscured by adjacent airspace disease. There is a prominent 10 mm right hilar node. No enlarged mediastinal lymph nodes. Patulous esophagus without wall thickening. Lungs/Pleura: Dense airspace consolidation throughout the right upper lobe with subtotal lobar collapse. There is mild narrowing of the right upper lobe bronchus, although no discrete centrally obstructing lesion is seen. Small portion of the apical right upper lobe is aerated. Paramediastinal consolidations/atelectasis in the right middle lobe is well. There is narrowing of the right middle lobe bronchus. There is filling of the basilar segmental right lower lobe bronchi with mucous plugging. Minimal layering debris or secretions within the right mainstem bronchus. The  left lung is clear. No pleural effusion. Upper Abdomen: Elongated left lobe of the liver wraps into the left upper quadrant, variant anatomy. No acute upper abdominal findings. Musculoskeletal: There are no acute or suspici

## 2022-04-09 NOTE — Discharge Instructions (Signed)
You have been placed on antibiotics to treat you for this pneumonia.  However you do need close follow-up with a lung specialist, please call the [facility] pulmonology group here in [place] tomorrow morning to arrange an office visit for further evaluation and management of this infection.  In the interim return here if you have any new or worsening symptoms.  Continue to take your regular home asthma medications. ?

## 2022-04-09 NOTE — ED Triage Notes (Signed)
Chest pain with cough, states she is coughing up blood ?

## 2022-04-10 ENCOUNTER — Telehealth: Payer: Self-pay

## 2022-04-10 NOTE — Telephone Encounter (Signed)
Transition Care Management Unsuccessful Follow-up Telephone Call ? ?Date of discharge and from where:  04/09/2022-[place]  ? ?Attempts:  1st Attempt ? ?Reason for unsuccessful TCM follow-up call:  Left voice message ? ?  ?

## 2022-04-11 ENCOUNTER — Other Ambulatory Visit: Payer: Self-pay | Admitting: Allergy & Immunology

## 2022-04-11 ENCOUNTER — Emergency Department (HOSPITAL_COMMUNITY)
Admission: EM | Admit: 2022-04-11 | Discharge: 2022-04-11 | Disposition: A | Discharge status: Home / Self Care | Payer: Medicaid Other | Attending: Emergency Medicine | Admitting: Emergency Medicine

## 2022-04-11 ENCOUNTER — Other Ambulatory Visit: Payer: Self-pay

## 2022-04-11 ENCOUNTER — Encounter (HOSPITAL_COMMUNITY): Payer: Self-pay

## 2022-04-11 ENCOUNTER — Emergency Department (HOSPITAL_COMMUNITY): Payer: Medicaid Other

## 2022-04-11 DIAGNOSIS — R0602 Shortness of breath: Secondary | ICD-10-CM | POA: Diagnosis not present

## 2022-04-11 DIAGNOSIS — J4541 Moderate persistent asthma with (acute) exacerbation: Secondary | ICD-10-CM | POA: Insufficient documentation

## 2022-04-11 DIAGNOSIS — Z7951 Long term (current) use of inhaled steroids: Secondary | ICD-10-CM | POA: Diagnosis not present

## 2022-04-11 DIAGNOSIS — Z20822 Contact with and (suspected) exposure to covid-19: Secondary | ICD-10-CM | POA: Insufficient documentation

## 2022-04-11 DIAGNOSIS — M546 Pain in thoracic spine: Secondary | ICD-10-CM | POA: Insufficient documentation

## 2022-04-11 DIAGNOSIS — R0789 Other chest pain: Secondary | ICD-10-CM | POA: Diagnosis not present

## 2022-04-11 DIAGNOSIS — Z79899 Other long term (current) drug therapy: Secondary | ICD-10-CM | POA: Diagnosis not present

## 2022-04-11 DIAGNOSIS — D72829 Elevated white blood cell count, unspecified: Secondary | ICD-10-CM | POA: Insufficient documentation

## 2022-04-11 DIAGNOSIS — J189 Pneumonia, unspecified organism: Secondary | ICD-10-CM | POA: Diagnosis not present

## 2022-04-11 DIAGNOSIS — R509 Fever, unspecified: Secondary | ICD-10-CM | POA: Diagnosis not present

## 2022-04-11 LAB — CBC WITH DIFFERENTIAL/PLATELET
Abs Immature Granulocytes: 0.04 10*3/uL (ref 0.00–0.07)
Basophils Absolute: 0.1 10*3/uL (ref 0.0–0.1)
Basophils Relative: 1 %
Eosinophils Absolute: 0.6 10*3/uL — ABNORMAL HIGH (ref 0.0–0.5)
Eosinophils Relative: 4 %
HCT: 42.5 % (ref 36.0–46.0)
Hemoglobin: 14 g/dL (ref 12.0–15.0)
Immature Granulocytes: 0 %
Lymphocytes Relative: 26 %
Lymphs Abs: 3.2 10*3/uL (ref 0.7–4.0)
MCH: 30.6 pg (ref 26.0–34.0)
MCHC: 32.9 g/dL (ref 30.0–36.0)
MCV: 93 fL (ref 80.0–100.0)
Monocytes Absolute: 0.7 10*3/uL (ref 0.1–1.0)
Monocytes Relative: 5 %
Neutro Abs: 8.2 10*3/uL — ABNORMAL HIGH (ref 1.7–7.7)
Neutrophils Relative %: 64 %
Platelets: 394 10*3/uL (ref 150–400)
RBC: 4.57 MIL/uL (ref 3.87–5.11)
RDW: 13.7 % (ref 11.5–15.5)
WBC: 12.7 10*3/uL — ABNORMAL HIGH (ref 4.0–10.5)
nRBC: 0 % (ref 0.0–0.2)

## 2022-04-11 LAB — COMPREHENSIVE METABOLIC PANEL
ALT: 17 U/L (ref 0–44)
AST: 17 U/L (ref 15–41)
Albumin: 4.3 g/dL (ref 3.5–5.0)
Alkaline Phosphatase: 68 U/L (ref 38–126)
Anion gap: 8 (ref 5–15)
BUN: 15 mg/dL (ref 6–20)
CO2: 22 mmol/L (ref 22–32)
Calcium: 8.8 mg/dL — ABNORMAL LOW (ref 8.9–10.3)
Chloride: 110 mmol/L (ref 98–111)
Creatinine, Ser: 0.75 mg/dL (ref 0.44–1.00)
GFR, Estimated: >60 mL/min (ref >60)
Glucose, Bld: 89 mg/dL (ref 70–99)
Potassium: 3.7 mmol/L (ref 3.5–5.1)
Sodium: 140 mmol/L (ref 135–145)
Total Bilirubin: 0.7 mg/dL (ref 0.3–1.2)
Total Protein: 7.6 g/dL (ref 6.5–8.1)

## 2022-04-11 LAB — TROPONIN I (HIGH SENSITIVITY)
Troponin I (High Sensitivity): <2 ng/L (ref <18)
Troponin I (High Sensitivity): <2 ng/L (ref <18)

## 2022-04-11 LAB — HCG, SERUM, QUALITATIVE: Preg, Serum: NEGATIVE

## 2022-04-11 LAB — RESP PANEL BY RT-PCR (FLU A&B, COVID) ARPGX2
Influenza A by PCR: NEGATIVE
Influenza B by PCR: NEGATIVE
SARS Coronavirus 2 by RT PCR: NEGATIVE

## 2022-04-11 LAB — PROTIME-INR
INR: 1 (ref 0.8–1.2)
Prothrombin Time: 12.9 seconds (ref 11.4–15.2)

## 2022-04-11 LAB — APTT: aPTT: 31 seconds (ref 24–36)

## 2022-04-11 LAB — LACTIC ACID, PLASMA: Lactic Acid, Venous: 0.9 mmol/L (ref 0.5–1.9)

## 2022-04-11 MED ORDER — ALBUTEROL SULFATE (2.5 MG/3ML) 0.083% IN NEBU
2.5000 mg | INHALATION_SOLUTION | Freq: Once | RESPIRATORY_TRACT | Status: AC
  Administered 2022-04-11: 2.5 mg via RESPIRATORY_TRACT
  Filled 2022-04-11: qty 3

## 2022-04-11 MED ORDER — ALBUTEROL SULFATE (2.5 MG/3ML) 0.083% IN NEBU
2.5000 mg | INHALATION_SOLUTION | Freq: Four times a day (QID) | RESPIRATORY_TRACT | 1 refills | Status: DC | PRN
Start: 2022-04-11 — End: 2023-02-08

## 2022-04-11 MED ORDER — IPRATROPIUM-ALBUTEROL 0.5-2.5 (3) MG/3ML IN SOLN
3.0000 mL | Freq: Once | RESPIRATORY_TRACT | Status: AC
  Administered 2022-04-11: 3 mL via RESPIRATORY_TRACT
  Filled 2022-04-11: qty 3

## 2022-04-11 MED ORDER — ALBUTEROL SULFATE HFA 108 (90 BASE) MCG/ACT IN AERS: 2.0000 | INHALATION_SPRAY | RESPIRATORY_TRACT | 1 refills | Status: DC | PRN

## 2022-04-11 MED ORDER — LACTATED RINGERS IV BOLUS
1000.0000 mL | Freq: Once | INTRAVENOUS | Status: AC
  Administered 2022-04-11: 1000 mL via INTRAVENOUS

## 2022-04-11 MED ORDER — ALBUTEROL SULFATE HFA 108 (90 BASE) MCG/ACT IN AERS
2.0000 | INHALATION_SPRAY | Freq: Once | RESPIRATORY_TRACT | Status: AC
  Administered 2022-04-11: 2 via RESPIRATORY_TRACT
  Filled 2022-04-11: qty 6.7

## 2022-04-11 MED ORDER — HYDROCODONE-ACETAMINOPHEN 5-325 MG PO TABS
1.0000 | ORAL_TABLET | Freq: Once | ORAL | Status: AC
  Administered 2022-04-11: 1 via ORAL
  Filled 2022-04-11: qty 1

## 2022-04-11 MED ORDER — PREDNISONE 20 MG PO TABS: ORAL_TABLET | ORAL | 0 refills | Status: DC

## 2022-04-11 MED ORDER — METHYLPREDNISOLONE SODIUM SUCC 125 MG IJ SOLR
125.0000 mg | Freq: Once | INTRAMUSCULAR | Status: AC
  Administered 2022-04-11: 125 mg via INTRAVENOUS
  Filled 2022-04-11: qty 2

## 2022-04-11 MED ORDER — ALBUTEROL SULFATE (2.5 MG/3ML) 0.083% IN NEBU
2.5000 mg | INHALATION_SOLUTION | Freq: Once | RESPIRATORY_TRACT | Status: AC
Start: 2022-04-11 — End: 2022-04-11
  Administered 2022-04-11: 2.5 mg via RESPIRATORY_TRACT
  Filled 2022-04-11: qty 3

## 2022-04-11 MED ORDER — MAGNESIUM SULFATE 2 GM/50ML IV SOLN
2.0000 g | Freq: Once | INTRAVENOUS | Status: AC
  Administered 2022-04-11: 2 g via INTRAVENOUS
  Filled 2022-04-11: qty 50

## 2022-04-11 NOTE — ED Provider Notes (Signed)
?[facility] ?Provider Note ? ? ?CSN: [id number] ?Arrival date & time: 04/11/22  0931 ? ?  ? ?History ? ?Chief Complaint  ?Patient presents with  ? Shortness of Breath  ? ? ?Peggy [name] is a 31 y.o. female. ? ?HPI ?31 year old female with a history of asthma presents with worsening cough and pneumonia.  She originally developed a sinus infection starting 5 days ago.  Starting 3 days ago she started getting cough and shortness of breath and chest pain.  She came here because of the chest pain on 4/11 and was found to have right upper lobe pneumonia.  It was recommended she be admitted but she had to leave due to childcare reasons.  She has been taking the Augmentin as prescribed.  She still having wheezing and a nonproductive cough.  Has low-grade fevers of 99.  Feels like her symptoms are getting worse and not better so she returned to the ER.  She is having right-sided chest pain and mid back pain. ? ?Home Medications ?Prior to Admission medications   ?Medication Sig Start Date End Date Taking? Authorizing Provider  ?albuterol (VENTOLIN HFA) 108 (90 Base) MCG/ACT inhaler Inhale 2 puffs into the lungs every 4 (four) hours as needed for wheezing or shortness of breath. 04/11/22  Yes Sherwood Gambler, MD  ?predniSONE (DELTASONE) 20 MG tablet 2 tabs po daily x 4 days 04/12/22  Yes Sherwood Gambler, MD  ?AIMOVIG 70 MG/ML SOAJ Inject 70 mg into the skin every 30 (thirty) days. 01/24/22   [provider]  ?albuterol (PROVENTIL) (2.5 MG/3ML) 0.083% nebulizer solution Take 3 mLs (2.5 mg total) by nebulization every 6 (six) hours as needed for wheezing or shortness of breath. 04/11/22   Sherwood Gambler, MD  ?amoxicillin-clavulanate (AUGMENTIN) 875-125 MG tablet Take 1 tablet by mouth every 12 (twelve) hours. 04/09/22   Evalee Jefferson, PA-C  ?budesonide-formoterol (SYMBICORT) 160-4.5 MCG/ACT inhaler Inhale 2 puffs into the lungs 2 (two) times daily. 02/04/22   Dara Hoyer, FNP  ?cetirizine (ZYRTEC) 10  MG tablet Take 2 tablets (20 mg total) by mouth 2 (two) times daily. ?Patient taking differently: Take 10 mg by mouth 2 (two) times daily. 10/11/20   Valentina Shaggy, MD  ?Cholecalciferol (VITAMIN D3 PO) Take by mouth.    [provider]  ?famotidine (PEPCID) 20 MG tablet TAKE 1 TABLET BY MOUTH TWICE A DAY 06/22/21   Valentina Shaggy, MD  ?fluticasone [facility]) 50 MCG/ACT nasal spray Place 1-2 sprays into both nostrils daily. 03/19/22   [provider]  ?levocetirizine (XYZAL) 5 MG tablet Take 1 tablet (5 mg total) by mouth every evening. 01/28/22   Jaynee Eagles, PA-C  ?levothyroxine (SYNTHROID) 150 MCG tablet Take 1 tablet (150 mcg total) by mouth daily. 10/19/21   Cassandria Anger, MD  ?medroxyPROGESTERone (DEPO-PROVERA) 150 MG/ML injection Inject 1 mL (150 mg total) into the muscle every 3 (three) months. 07/31/21   Roma Schanz, CNM  ?metoprolol tartrate (LOPRESSOR) 25 MG tablet Take 0.5 tablets (12.5 mg total) by mouth 2 (two) times daily. 10/15/21   Arnoldo Lenis, MD  ?montelukast (SINGULAIR) 10 MG tablet Take 1 tablet (10 mg total) by mouth at bedtime. 02/04/22   Dara Hoyer, FNP  ?   ? ?Allergies    ?Metronidazole   ? ?Review of Systems   ?Review of Systems  ?Constitutional:  Positive for fever (low grade).  ?Respiratory:  Positive for cough, shortness of breath and wheezing.   ?Cardiovascular:  Positive for chest pain.  ?Musculoskeletal:  Positive for back pain.  ? ?Physical Exam ?Updated Vital Signs ?BP 114/71 (BP Location: Left Arm)   Pulse 100   Temp 98.8 ?F (37.1 ?C) (Oral)   Resp 18   Ht 5' (1.524 m)   Wt 70.3 kg   SpO2 95%   BMI 30.27 kg/m?  ?Physical Exam ?Vitals and nursing note reviewed.  ?Constitutional:   ?   General: She is not in acute distress. ?   Appearance: She is well-developed. She is not ill-appearing or diaphoretic.  ?HENT:  ?   Head: Normocephalic and atraumatic.  ?Cardiovascular:  ?   Rate and Rhythm: Normal rate and regular rhythm.  ?    Heart sounds: Normal heart sounds.  ?Pulmonary:  ?   Effort: Pulmonary effort is normal. No accessory muscle usage or respiratory distress.  ?   Breath sounds: Wheezing (diffuse) present.  ?Abdominal:  ?   General: There is no distension.  ?Skin: ?   General: Skin is warm and dry.  ?Neurological:  ?   Mental Status: She is alert.  ? ? ?ED Results / Procedures / Treatments   ?Labs ?(all labs ordered are listed, but only abnormal results are displayed) ?Labs Reviewed  ?COMPREHENSIVE METABOLIC PANEL - Abnormal; Notable for the following components:  ?    Result Value  ? Calcium 8.8 (*)   ? All other components within normal limits  ?CBC WITH DIFFERENTIAL/PLATELET - Abnormal; Notable for the following components:  ? WBC 12.7 (*)   ? Neutro Abs 8.2 (*)   ? Eosinophils Absolute 0.6 (*)   ? All other components within normal limits  ?RESP PANEL BY RT-PCR (FLU A&B, COVID) [id number]  ?CULTURE, BLOOD (ROUTINE X 2)  ?CULTURE, BLOOD (ROUTINE X 2)  ?LACTIC ACID, PLASMA  ?PROTIME-INR  ?APTT  ?HCG, SERUM, QUALITATIVE  ?TROPONIN I (HIGH SENSITIVITY)  ?TROPONIN I (HIGH SENSITIVITY)  ? ? ?EKG ?EKG Interpretation ? ?Date/Time:  Thursday April 11 2022 09:52:01 EDT ?Ventricular Rate:  83 ?PR Interval:  169 ?QRS Duration: 71 ?QT Interval:  368 ?QTC Calculation: 433 ?R Axis:   80 ?Text Interpretation: Sinus rhythm nonspecific T waves AVL Interpretation limited secondary to artifact Confirmed by Sherwood Gambler [phone]) on 04/11/2022 10:10:03 AM ? ?Radiology ?DG Chest 2 View ? ?Result Date: 04/09/2022 ?CLINICAL DATA:  Cough and chest pain and shortness of breath EXAM: CHEST - 2 VIEW COMPARISON:  12/18/2021 FINDINGS: Right upper lung paratracheal triangular opacity compatible with right upper lobe collapse. Difficult to exclude central airway obstruction. Clear left lung. No edema pattern, effusion, or pneumothorax. Normal heart size and vascularity. No acute osseous finding. IMPRESSION: Right upper lobe collapse.  See above comment.  Electronically Signed   By: Jerilynn Mages.  Shick M.D.   On: 04/09/2022 18:15  ? ?CT Chest W Contrast ? ?Result Date: 04/09/2022 ?CLINICAL DATA:  Pneumonia, complication suspected, xray done Patient reports cough and chest pain for 3 days. EXAM: CT CHEST WITH CONTRAST TECHNIQUE: Multidetector CT imaging of the chest was performed during intravenous contrast administration. RADIATION DOSE REDUCTION: This exam was performed according to the departmental dose-optimization program which includes automated exposure control, adjustment of the mA and/or kV according to patient size and/or use of iterative reconstruction technique. CONTRAST:  35m OMNIPAQUE IOHEXOL 300 MG/ML  SOLN COMPARISON:  Radiograph earlier today.  Chest CT 06/04/2016 FINDINGS: Cardiovascular: Normal caliber thoracic aorta. The heart is normal in size. No pericardial effusion. Mediastinum/Nodes: Assessment of the right hilum is partially obscured by  adjacent airspace disease. There is a prominent 10 mm right hilar node. No enlarged mediastinal lymph nodes. Patulous esophagus without wall thickening. Lungs/Pleura: Dense airspace consolidation throughout the right upper lobe with subtotal lobar collapse. There is mild narrowing of the right upper lobe bronchus, although no discrete centrally obstructing lesion is seen. Small portion of the apical right upper lobe is aerated. Paramediastinal consolidations/atelectasis in the right middle lobe is well. There is narrowing of the right middle lobe bronchus. There is filling of the basilar segmental right lower lobe bronchi with mucous plugging. Minimal layering debris or secretions within the right mainstem bronchus. The left lung is clear. No pleural effusion. Upper Abdomen: Elongated left lobe of the liver wraps into the left upper quadrant, variant anatomy. No acute upper abdominal findings. Musculoskeletal: There are no acute or suspicious osseous abnormalities. IMPRESSION: 1. Right upper lobe consolidation is likely  a combination of pneumonia and partial collapse. Paramediastinal consolidation/atelectasis in the right middle lobe. There is occlusion of the right upper lobe bronchus and narrowing of the right middle lobe bronchus, u

## 2022-04-11 NOTE — ED Triage Notes (Signed)
Patient seen on Tuesday and treated for pneumonia, feels like she is getting worse.  ?

## 2022-04-11 NOTE — Telephone Encounter (Signed)
Transition Care Management Unsuccessful Follow-up Telephone Call ? ?Date of discharge and from where:  04/09/2022-[name]  ? ?Attempts:  2nd Attempt ? ?Reason for unsuccessful TCM follow-up call:  Left voice message ? ?  ?

## 2022-04-11 NOTE — Discharge Instructions (Addendum)
Return to the ER if you develop fever, coughing up blood, new or worsening shortness of breath, or any other new/concerning symptoms. ? ?Start the steroids tomorrow as you were given your first dose today. ? ?Use your albuterol 2 puffs every 4 hours during this asthma exacerbation.  If he needed significantly more than this and then return to the ER. ?

## 2022-04-11 NOTE — Telephone Encounter (Signed)
Left voicemail for patient to call back to schedule appointment. Looks like patient no showed 2 appointments in March.  ?

## 2022-04-11 NOTE — ED Notes (Signed)
Respiratory contacted for nebulizer. ? ?

## 2022-04-12 ENCOUNTER — Telehealth: Payer: Self-pay | Admitting: Internal Medicine

## 2022-04-12 ENCOUNTER — Ambulatory Visit (INDEPENDENT_AMBULATORY_CARE_PROVIDER_SITE_OTHER): Payer: Medicaid Other | Admitting: Internal Medicine

## 2022-04-12 ENCOUNTER — Encounter: Payer: Self-pay | Admitting: Internal Medicine

## 2022-04-12 VITALS — BP 142/88 | HR 102 | Temp 98.9°F | Ht 60.0 in | Wt 156.4 lb

## 2022-04-12 DIAGNOSIS — J449 Chronic obstructive pulmonary disease, unspecified: Secondary | ICD-10-CM | POA: Diagnosis not present

## 2022-04-12 DIAGNOSIS — J302 Other seasonal allergic rhinitis: Secondary | ICD-10-CM | POA: Diagnosis not present

## 2022-04-12 DIAGNOSIS — F17200 Nicotine dependence, unspecified, uncomplicated: Secondary | ICD-10-CM

## 2022-04-12 DIAGNOSIS — F17210 Nicotine dependence, cigarettes, uncomplicated: Secondary | ICD-10-CM | POA: Diagnosis not present

## 2022-04-12 DIAGNOSIS — J3089 Other allergic rhinitis: Secondary | ICD-10-CM | POA: Diagnosis not present

## 2022-04-12 DIAGNOSIS — R0789 Other chest pain: Secondary | ICD-10-CM | POA: Diagnosis not present

## 2022-04-12 MED ORDER — METHYLPREDNISOLONE ACETATE 80 MG/ML IJ SUSP
80.0000 mg | Freq: Once | INTRAMUSCULAR | Status: AC
  Administered 2022-04-12: 80 mg via INTRAMUSCULAR

## 2022-04-12 MED ORDER — ACETAMINOPHEN-CODEINE #3 300-30 MG PO TABS: 1.0000 | ORAL_TABLET | ORAL | 0 refills | Status: AC | PRN

## 2022-04-12 MED ORDER — OMEPRAZOLE 40 MG PO CPDR: 40.0000 mg | DELAYED_RELEASE_CAPSULE | Freq: Every day | ORAL | 2 refills | Status: DC

## 2022-04-12 NOTE — Assessment & Plan Note (Signed)
Due to severe cough 04/12/2022 > rx Tyl #3  ? ?Also rx with g dm and gerd rx to prevent cyclical cough > worse cp  ? ?    ?  ? ?Each maintenance medication was reviewed in detail including emphasizing most importantly the difference between maintenance and prns and under what circumstances the prns are to be triggered using an action plan format where appropriate. ? ?Total time for H and P, chart review, counseling, reviewing hfa device(s) and generating customized AVS unique to this office visit / same day charting = 45 min with pt new to me ?     ? ? ?

## 2022-04-12 NOTE — Telephone Encounter (Signed)
Called and spoke to patient she states she has pneumonia and that the ED has been telling her to follow up with our clinic. Patient has not been seen in 5 years. Last OV was with Dr. Lamonte Sakai in 2018. ?15 minute slot available today on Dr. Sudie Bailey schedule at 2:30. Patient is persistent on being seen today and states she is very sick.  ? ?Dr. Lamonte Sakai are you okay with [name] scheduling this patient today in that slot?  ? ?

## 2022-04-12 NOTE — Progress Notes (Signed)
? ?Peggy [name], female    DOB: Jun 13, 1991, 31 y.o.   MRN: [id number] ? ? ?Brief patient profile:  ?44   yowf  active smoker with childhood sinus/ear problems intermittently self  referred to pulmonary clinic in [facility]  04/12/2022 maint on symbicort 160 2bid and just on saba prn but marked increase in saba hfa and neb assoc with sinus dz flare x 04/05/22 no better p 2 ER trips   ? ? ? ? ?History of Present Illness  ?04/12/2022  Pulmonary/ 1st office eval/ Melvyn Novas / [facility]  ?Chief Complaint  ?Patient presents with  ? Acute Visit  ?  States ED  told her Tuesday 4/11 was told she had pneumonia and r lung collapsed. Returned to ED yesterday 04/11/2022 and they advised to follow up here. Feels she has not improved at all.   ?Dyspnea:  worse for several months  then acutely worse 4/7 assoc with nasal congestion and R ant cp > ED  got CT  4/11 with RUL ATX that was better on 4/13 portable film although she says not better with gen p worse on R ant and much worse during coughing fits  ?Cough: was green / on augmentin now  ?Sleep: breathing worse so had to sit in chair since 04/08/22  ?SABA use: neb at 7am prior to OV   ? ?No obvious day to day or daytime variability or assoc excess/ purulent sputum or mucus plugs or hemoptysis or overt  hb symptoms.  ? ?  Also denies any obvious fluctuation of symptoms with weather or environmental changes or other aggravating or alleviating factors except as outlined above  ? ?No unusual exposure hx or h/o childhood pna/ asthma or knowledge of premature birth. ? ?Current Allergies, Complete Past Medical History, Past Surgical History, Family History, and Social History were reviewed in [employer] record. ? ?ROS  The following are not active complaints unless bolded ?Hoarseness, sore throat, dysphagia, dental problems, itching, sneezing,  nasal congestion or discharge of excess mucus or purulent secretions, ear ache,   fever, chills, sweats, unintended wt  loss or wt gain, classically pleuritic or exertional cp,  orthopnea pnd or arm/hand swelling  or leg swelling, presyncope, palpitations, abdominal pain, anorexia, nausea, vomiting, diarrhea  or change in bowel habits or change in bladder habits, change in stools or change in urine, dysuria, hematuria,  rash, arthralgias, visual complaints, headache, numbness, weakness or ataxia or problems with walking or coordination,  change in mood or  memory. ?      ?   ? ? ? ? ?Past Medical History:  ?Diagnosis Date  ? Abnormal Pap smear of cervix 06/22/2020  ? 05/2020 pap LSIL negative HPV and GC/CHL  As per ASCCP guidelines repeat in 1 year, 5 year risk of CIN 3 + is 2%  ? Asthma   ? Gestational diabetes   ? Pyloric stenosis   ? Thyroid disease   ? Graves  ? Urticaria   ? ? ?Outpatient Medications Prior to Visit  ?Medication Sig Dispense Refill  ? AIMOVIG 70 MG/ML SOAJ Inject 70 mg into the skin every 30 (thirty) days.    ? albuterol (PROVENTIL) (2.5 MG/3ML) 0.083% nebulizer solution Take 3 mLs (2.5 mg total) by nebulization every 6 (six) hours as needed for wheezing or shortness of breath. 75 mL 1  ? albuterol (VENTOLIN HFA) 108 (90 Base) MCG/ACT inhaler Inhale 2 puffs into the lungs every 4 (four) hours as needed for wheezing or shortness  of breath. 8 g 1  ? amoxicillin-clavulanate (AUGMENTIN) 875-125 MG tablet Take 1 tablet by mouth every 12 (twelve) hours. 20 tablet 0  ? budesonide-formoterol (SYMBICORT) 160-4.5 MCG/ACT inhaler Inhale 2 puffs into the lungs 2 (two) times daily. 1 each 5  ? cetirizine (ZYRTEC) 10 MG tablet Take 1 tablet (10 mg total) by mouth daily. 30 tablet 5  ? Cholecalciferol (VITAMIN D3 PO) Take by mouth.    ? famotidine (PEPCID) 20 MG tablet TAKE 1 TABLET BY MOUTH TWICE A DAY 180 tablet 5  ? fluticasone (FLONASE) 50 MCG/ACT nasal spray Place 1-2 sprays into both nostrils daily.    ? levocetirizine (XYZAL) 5 MG tablet Take 1 tablet (5 mg total) by mouth every evening. 90 tablet 0  ? levothyroxine  (SYNTHROID) 150 MCG tablet Take 1 tablet (150 mcg total) by mouth daily. 90 tablet 1  ? medroxyPROGESTERone (DEPO-PROVERA) 150 MG/ML injection Inject 1 mL (150 mg total) into the muscle every 3 (three) months. 1 mL 3  ? metoprolol tartrate (LOPRESSOR) 25 MG tablet Take 0.5 tablets (12.5 mg total) by mouth 2 (two) times daily. 90 tablet 1  ? montelukast (SINGULAIR) 10 MG tablet Take 1 tablet (10 mg total) by mouth at bedtime. 30 tablet 5  ? predniSONE (DELTASONE) 20 MG tablet 2 tabs po daily x 4 days 8 tablet 0  ? ?No facility-administered medications prior to visit.  ? ? ? ?Objective:  ?  ? ?BP (!) 142/88 (BP Location: Left Arm, Patient Position: Sitting)   Pulse (!) 102   Temp 98.9 ?F (37.2 ?C) (Temporal)   Ht 5' (1.524 m)   Wt 156 lb 6.4 oz (70.9 kg)   SpO2 92% Comment: ra  BMI 30.54 kg/m?  ? ?SpO2: 92 % (ra) ? ?Somber wf with congested sounding cough and obvious component of pseudowheeze  ? ? ? HEENT : nl exam   ? ?NECK :  without JVD/Nodes/TM/ nl carotid upstrokes bilaterally ? ? ?LUNGS: no acc muscle use,  Min barrel  contour chest wall with bilateral insp/exp sonorous rhonchi  and  without cough on insp or exp maneuvers and min  Hyperresonant  to  percussion bilaterally   ? ? ?CV:  RRR  no s3 or murmur or increase in P2, and no edema  ? ?ABD:  soft and nontender with pos end  insp Hoover's  in the supine position. No bruits or organomegaly appreciated, bowel sounds nl ? ?MS:   Nl gait/  ext warm without deformities, calf tenderness, cyanosis or clubbing ?No obvious joint restrictions  ? ?SKIN: warm and dry without lesions   ? ?NEURO:  alert, approp, nl sensorium with  no motor or cerebellar deficits apparent.  ?    ? ? ?I personally reviewed images and agree with radiology impression as follows:  ?CXR:   portable 04/11/22 ?Interval clearing of right upper lobe infiltrate/atelectasis. Lungs ?now clear. ?  ?   ?Assessment  ? ?Asthmatic bronchitis , chronic (HCC) ?Active smoker ?- 04/12/2022  After extensive  coaching inhaler device,  effectiveness =   50% > continue symbicort 160 2bid  ? ?DDX of  difficult airways management almost all start with A and  include Adherence, Ace Inhibitors, Acid Reflux, Active Sinus Disease, Alpha 1 Antitripsin deficiency, Anxiety masquerading as Airways dz,  ABPA,  Allergy(esp in young), Aspiration (esp in elderly), Adverse effects of meds,  Active smoking or vaping, A bunch of PE's (a small clot burden can't cause this syndrome unless there is already  severe underlying pulm or vascular dz with poor reserve) plus two Bs  = Bronchiectasis and Beta blocker use..and one C= CHF ? ?Adherence is always the initial "prime suspect" and is a multilayered concern that requires a "trust but verify" approach in every patient - starting with knowing how to use medications, especially inhalers, correctly, keeping up with refills and understanding the fundamental difference between maintenance and prns vs those medications only taken for a very short course and then stopped and not refilled.  ?- see hfa teaching ?- return with all meds in hand using a trust but verify approach to confirm accurate Medication  Reconciliation The principal here is that until we are certain that the  patients are doing what we've asked, it makes no sense to ask them to do more.  ? ?Active smoking top of the list > see sep a/p  ? ?? Acid (or non-acid) GERD > always difficult to exclude as up to 75% of pts in some series report no assoc GI/ Heartburn symptoms> rec max (24h)  acid suppression and diet restrictions/ reviewed and instructions given in writing.  ? ?? Active sinus dz > augmentin x 10 days but may need to extend or f/u with CT sinus  ? ?? Allergy > depomedrol 80 mg IM and finish pred, continue high dose ICS/singulair  ? ?? Anxiety/depression  > usually at the bottom of this list of usual suspects bu may interfere with adherence and also interpretation of response or lack thereof to symptom management which can be  quite subjective.  ? ? ? ?Chest pain, musculoskeletal ?Due to severe cough 04/12/2022 > rx Tyl #3  ? ?Also rx with g dm and gerd rx to prevent cyclical cough > worse cp  ? ?   ? ?Each maintenance medication was re

## 2022-04-12 NOTE — Telephone Encounter (Signed)
Thank you :)

## 2022-04-12 NOTE — Assessment & Plan Note (Signed)
Active smoker ?- 04/12/2022  After extensive coaching inhaler device,  effectiveness =   50% > continue symbicort 160 2bid  ? ?DDX of  difficult airways management almost all start with A and  include Adherence, Ace Inhibitors, Acid Reflux, Active Sinus Disease, Alpha 1 Antitripsin deficiency, Anxiety masquerading as Airways dz,  ABPA,  Allergy(esp in young), Aspiration (esp in elderly), Adverse effects of meds,  Active smoking or vaping, A bunch of PE's (a small clot burden can't cause this syndrome unless there is already severe underlying pulm or vascular dz with poor reserve) plus two Bs  = Bronchiectasis and Beta blocker use..and one C= CHF ? ?Adherence is always the initial "prime suspect" and is a multilayered concern that requires a "trust but verify" approach in every patient - starting with knowing how to use medications, especially inhalers, correctly, keeping up with refills and understanding the fundamental difference between maintenance and prns vs those medications only taken for a very short course and then stopped and not refilled.  ?- see hfa teaching ?- return with all meds in hand using a trust but verify approach to confirm accurate Medication  Reconciliation The principal here is that until we are certain that the  patients are doing what we've asked, it makes no sense to ask them to do more.  ? ?Active smoking top of the list > see sep a/p  ? ?? Acid (or non-acid) GERD > always difficult to exclude as up to 75% of pts in some series report no assoc GI/ Heartburn symptoms> rec max (24h)  acid suppression and diet restrictions/ reviewed and instructions given in writing.  ? ?? Active sinus dz > augmentin x 10 days but may need to extend or f/u with CT sinus  ? ?? Allergy > depomedrol 80 mg IM and finish pred, continue high dose ICS/singulair  ? ?? Anxiety/depression  > usually at the bottom of this list of usual suspects bu may interfere with adherence and also interpretation of response or lack  thereof to symptom management which can be quite subjective.  ?

## 2022-04-12 NOTE — Telephone Encounter (Signed)
Transition Care Management Unsuccessful Follow-up Telephone Call ? ?Date of discharge and from where:  04/09/2022-[place] ? ?Attempts:  3rd Attempt ? ?Reason for unsuccessful TCM follow-up call:  Left voice message ? ?  ?

## 2022-04-12 NOTE — Telephone Encounter (Signed)
Patient is going to be seen by Dr. Melvyn Novas in [facility] office this morning. Cancel sick message  ?

## 2022-04-12 NOTE — Patient Instructions (Addendum)
Go home and take your prednisone immediately  - take with food  ? ?Plan A = Automatic = Always=  Symbiocort 160 (or breztri) Take 2 puffs first thing in am and then another 2 puffs about 12 hours later.  ?  ?Work on inhaler technique:  relax and gently blow all the way out then take a nice smooth full deep breath back in, triggering the inhaler at same time you start breathing in.  Hold for up to 5 seconds if you can. Blow out thru nose. Rinse and gargle with water when done.  If mouth or throat bother you at all,  try brushing teeth/gums/tongue with arm and hammer toothpaste/ make a slurry and gargle and spit out.  ? ?  ?Plan B = Backup (to supplement plan A, not to replace it) ?Only use your albuterol inhaler as a rescue medication to be used if you can't catch your breath by resting or doing a relaxed purse lip breathing pattern.  ?- The less you use it, the better it will work when you need it. ?- Ok to use the inhaler up to 2 puffs  every 4 hours if you must but call for appointment if use goes up over your usual need ?- Don't leave home without it !!  (think of it like the spare tire for your car)  ? ?Plan C = Crisis (instead of Plan B but only if Plan B stops working) ?- only use your albuterol nebulizer if you first try Plan B and it fails to help > ok to use the nebulizer up to every 4 hours but if start needing it regularly call for immediate appointment ? ?  ?Try omeprazole 40 mg  Take 30-60 min before first meal of the day and Pepcid ac (famotidine) 20 mg one after supper and  @  bedtime until cough is completely gone for at least a week without the need for cough suppression ? ?For cough / congestion >  mucinex dm 1200 mg every 12 hours supplement with tylenol #3 every 4 hours as needed   ? ? ?Depomedrol 80 mg IM  ? ?Stop all smoking ? ?Please schedule a follow up office visit in 4 weeks, sooner if needed  with all medications /inhalers/ solutions in hand so we can verify exactly what you are taking.  This includes all medications from all doctors and over the counters  ? ? ?    ?

## 2022-04-12 NOTE — Assessment & Plan Note (Signed)
4-5 min discussion re active cigarette smoking in addition to office E&M ? ?Ask about tobacco use:   ongoing ?Advise quitting  I took an extended  opportunity with this patient to outline the consequences of continued cigarette use  in airway disorders based on all the data we have from the multiple national lung health studies (perfomed over decades at millions of dollars in cost)  indicating that smoking cessation, not choice of inhalers or physicians, is the most important aspect of her  care.   ?Assess willingness: ? committed at this point ?Assist in quit attempt:  Per PCP when ready ?Arrange follow up:   Follow up per Primary Care planned  ?  ?  ?  ?

## 2022-04-16 LAB — CULTURE, BLOOD (ROUTINE X 2)
Culture: NO GROWTH
Culture: NO GROWTH
Special Requests: ADEQUATE
Special Requests: ADEQUATE

## 2022-04-17 ENCOUNTER — Ambulatory Visit
Admission: EM | Admit: 2022-04-17 | Discharge: 2022-04-17 | Disposition: A | Discharge status: Home / Self Care | Payer: Medicaid Other | Attending: Family Medicine | Admitting: Family Medicine

## 2022-04-17 ENCOUNTER — Ambulatory Visit (INDEPENDENT_AMBULATORY_CARE_PROVIDER_SITE_OTHER): Payer: Medicaid Other

## 2022-04-17 DIAGNOSIS — R059 Cough, unspecified: Secondary | ICD-10-CM

## 2022-04-17 DIAGNOSIS — R062 Wheezing: Secondary | ICD-10-CM

## 2022-04-17 DIAGNOSIS — R051 Acute cough: Secondary | ICD-10-CM

## 2022-04-17 DIAGNOSIS — R509 Fever, unspecified: Secondary | ICD-10-CM

## 2022-04-17 MED ORDER — DEXAMETHASONE SODIUM PHOSPHATE 10 MG/ML IJ SOLN
10.0000 mg | Freq: Once | INTRAMUSCULAR | Status: AC
Start: 2022-04-17 — End: 2022-04-17
  Administered 2022-04-17: 10 mg via INTRAMUSCULAR

## 2022-04-17 NOTE — ED Triage Notes (Signed)
Pt reports cough and congestion x 1 week. Pt is taking amoxicillin and Mucinex for pneumonia.  ?

## 2022-04-17 NOTE — ED Provider Notes (Signed)
?[facility] ? ? ?[id number] ?04/17/22 Arrival Time: 1220 ? ?ASSESSMENT & PLAN: ? ?1. Acute cough   ?2. Subjective fever   ?3. Wheezing   ? ?I have personally viewed the imaging studies ordered this visit. ?PNA from previous imaging has resolved. ? ?Will finish antibiotic. ?Meds ordered this encounter  ?Medications  ? dexamethasone (DECADRON) injection 10 mg  ?Given above for wheezing. ?Is a smoker. No resp distress. ? ? Follow-up Information   ? ? Practice, [facility].   ?Why: As needed. ?Contact information: ?[address] ?[place] [address] ?[phone] ? ? ?  ?  ? ?  ?  ? ?  ? ?Reviewed expectations re: course of current medical issues. Questions answered. ?Outlined signs and symptoms indicating need for more acute intervention. ?Understanding verbalized. ?After Visit Summary given. ? ? ?SUBJECTIVE: ?History from: Patient. ?Peggy [name] is a 31 y.o. female. Reports: chest congestion; is being treated for PNA; day #7 of antibiotic. Feels she is wheezing. Questions subj fever. Coughing has improved. Normal PO intake without n/v/d. ? ?OBJECTIVE: ? ?Vitals:  ? 04/17/22 1306  ?BP: 118/81  ?Pulse: 75  ?Resp: 18  ?Temp: 99.8 ?F (37.7 ?C)  ?TempSrc: Oral  ?SpO2: 95%  ?  ?General appearance: alert; no distress ?Eyes: PERRLA; EOMI; conjunctiva normal ?HENT: [place]; AT; without nasal congestion ?Neck: supple  ?Lungs: speaks full sentences without difficulty; unlabored; bilat exp wheezing; no resp distress ?Extremities: no edema ?Skin: warm and dry ?Neurologic: normal gait ?Psychological: alert and cooperative; normal mood and affect ? ? ?Imaging: ?DG Chest 2 View ? ?Result Date: 04/17/2022 ?CLINICAL DATA:  cough; fever EXAM: CHEST - 2 VIEW COMPARISON:  Chest x-ray April 11, 2022. FINDINGS: No consolidation. No visible pleural effusions or pneumothorax. Cardiomediastinal silhouette is within normal limits. No evidence of acute osseous abnormality. IMPRESSION: No evidence of acute cardiopulmonary disease.  Electronically Signed   By: Margaretha Sheffield M.D.   On: 04/17/2022 13:29  ? ? ?Allergies  ?Allergen Reactions  ? Metronidazole Nausea Only  ?  Patient requests gel form for all future treatment of BV  ? ? ?Past Medical History:  ?Diagnosis Date  ? Abnormal Pap smear of cervix 06/22/2020  ? 05/2020 pap LSIL negative HPV and GC/CHL  As per ASCCP guidelines repeat in 1 year, 5 year risk of CIN 3 + is 2%  ? Asthma   ? Gestational diabetes   ? Pyloric stenosis   ? Thyroid disease   ? Graves  ? Urticaria   ? ?Social History  ? ?Socioeconomic History  ? Marital status: Divorced  ?  Spouse name: Fionna Merriott  ? Number of children: 2  ? Years of education: Not on file  ? Highest education level: Some college, no degree  ?Occupational History  ? Not on file  ?Tobacco Use  ? Smoking status: Every Day  ?  Packs/day: 0.25  ?  Years: 5.00  ?  Pack years: 1.25  ?  Types: Cigarettes  ? Smokeless tobacco: Never  ? Tobacco comments:  ?  2 cig/day  ?Vaping Use  ? Vaping Use: Never used  ?Substance and Sexual Activity  ? Alcohol use: Yes  ?  Comment: rarely  ? Drug use: No  ? Sexual activity: Yes  ?  Birth control/protection: Injection  ?Other Topics Concern  ? Not on file  ?Social History Narrative  ? Not on file  ? ?Social Determinants of Health  ? ?Financial Resource Strain: Low Risk   ? Difficulty  of Paying Living Expenses: Not hard at all  ?Food Insecurity: No Food Insecurity  ? Worried About Charity fundraiser in the Last Year: Never true  ? Ran Out of Food in the Last Year: Never true  ?Transportation Needs: No Transportation Needs  ? Lack of Transportation (Medical): No  ? Lack of Transportation (Non-Medical): No  ?Physical Activity: Sufficiently Active  ? Days of Exercise per Week: 5 days  ? Minutes of Exercise per Session: 60 min  ?Stress: Stress Concern Present  ? Feeling of Stress : Very much  ?Social Connections: Moderately Isolated  ? Frequency of Communication with Friends and Family: Twice a week  ? Frequency of  Social Gatherings with Friends and Family: Once a week  ? Attends Religious Services: 1 to 4 times per year  ? Active Member of Clubs or Organizations: No  ? Attends Archivist Meetings: Never  ? Marital Status: Divorced  ?Intimate Partner Violence: Not At Risk  ? Fear of Current or Ex-Partner: No  ? Emotionally Abused: No  ? Physically Abused: No  ? Sexually Abused: No  ? ?Family History  ?Problem Relation Age of Onset  ? Arthritis Mother   ? Cancer Mother   ?     thyroid  ? Depression Mother   ? Hyperlipidemia Father   ? Asthma Father   ? Hypertension Father   ? Heart disease Father   ? COPD Maternal Grandfather   ? Diabetes Paternal Grandmother   ? Arthritis Paternal Grandmother   ? Asthma Paternal Grandmother   ? Heart disease Paternal Grandmother   ? Depression Paternal Grandmother   ? Hypertension Paternal Grandmother   ? Hyperlipidemia Paternal Grandmother   ? Other Neg Hx   ? ?Past Surgical History:  ?Procedure Laterality Date  ? PYLOROMYOTOMY    ? ?  ?Vanessa Kick, MD ?04/17/22 1354 ? ?

## 2022-04-19 ENCOUNTER — Ambulatory Visit (INDEPENDENT_AMBULATORY_CARE_PROVIDER_SITE_OTHER): Payer: Medicaid Other

## 2022-04-19 DIAGNOSIS — Z3042 Encounter for surveillance of injectable contraceptive: Secondary | ICD-10-CM

## 2022-04-19 MED ORDER — MEDROXYPROGESTERONE ACETATE 150 MG/ML IM SUSP
150.0000 mg | Freq: Once | INTRAMUSCULAR | Status: AC
  Administered 2022-04-19: 150 mg via INTRAMUSCULAR

## 2022-04-19 NOTE — Progress Notes (Signed)
? ?  NURSE VISIT- INJECTION ? ?SUBJECTIVE:  ?Peggy [name] is a 31 y.o. [phone] female here for a Depo Provera for contraception/period management. She is a GYN patient.  ? ?OBJECTIVE:  ?There were no vitals taken for this visit.  ?Appears well, in no apparent distress ? ?Injection administered in: Right deltoid ? ?Meds ordered this encounter  ?Medications  ? medroxyPROGESTERone (DEPO-PROVERA) injection 150 mg  ? ? ?ASSESSMENT: ?GYN patient Depo Provera for contraception/period management ?PLAN: ?Follow-up: in 11-13 weeks for next Depo  ? ?Carmino Ocain A Laura-Lee Villegas  ?04/19/2022 ?11:51 AM ? ?

## 2022-04-30 ENCOUNTER — Encounter: Payer: Self-pay | Admitting: Emergency Medicine

## 2022-04-30 ENCOUNTER — Ambulatory Visit
Admission: EM | Admit: 2022-04-30 | Discharge: 2022-04-30 | Disposition: A | Discharge status: Home / Self Care | Payer: Medicaid Other | Attending: Family Medicine | Admitting: Family Medicine

## 2022-04-30 DIAGNOSIS — J069 Acute upper respiratory infection, unspecified: Secondary | ICD-10-CM | POA: Insufficient documentation

## 2022-04-30 LAB — POCT RAPID STREP A (OFFICE): Rapid Strep A Screen: NEGATIVE

## 2022-04-30 MED ORDER — PROMETHAZINE-DM 6.25-15 MG/5ML PO SYRP: 5.0000 mL | ORAL_SOLUTION | Freq: Four times a day (QID) | ORAL | 0 refills | Status: DC | PRN

## 2022-04-30 NOTE — ED Triage Notes (Signed)
Sore throat and ABD pain since yesterday ?

## 2022-04-30 NOTE — ED Provider Notes (Signed)
?[place] ? ? ? ?CSN: [id number] ?Arrival date & time: 04/30/22  1051 ? ? ?  ? ?History   ?Chief Complaint ?No chief complaint on file. ? ? ?HPI ?Peggy [name] is a 31 y.o. female.  ? ?Presenting today with 1 day history of sore throat, fever, cough, congestion.  Denies chest pain, shortness of breath, abdominal pain, nausea vomiting or diarrhea.  So far taking over-the-counter cold and congestion medication with minimal relief.  Children all sick with similar symptoms.  History of asthma and seasonal allergies on as needed medications.  Has not needed her inhaler since symptoms started. ? ? ?Past Medical History:  ?Diagnosis Date  ? Abnormal Pap smear of cervix 06/22/2020  ? 05/2020 pap LSIL negative HPV and GC/CHL  As per ASCCP guidelines repeat in 1 year, 5 year risk of CIN 3 + is 2%  ? Asthma   ? Gestational diabetes   ? Pyloric stenosis   ? Thyroid disease   ? Graves  ? Urticaria   ? ? ?Patient Active Problem List  ? Diagnosis Date Noted  ? Asthmatic bronchitis , chronic ([place]) 04/12/2022  ? Chest pain, musculoskeletal 04/12/2022  ? Not well controlled moderate persistent asthma 02/04/2022  ? Tobacco use 02/04/2022  ? Graves disease 02/08/2021  ? Vitamin D insufficiency 02/07/2021  ? 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 Chronic urticaria 10/12/2020  ? Recurrent infections 10/12/2020  ? Seasonal and perennial allergic rhinitis 10/12/2020  ? Mild persistent asthma, uncomplicated 44/31/5400  ? Abnormal Pap smear of cervix 06/22/2020  ? 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/09/2020  ? Graves' disease 01/24/2020  ? Depression screening 05/25/2019  ? History of gestational diabetes 10/28/2018  ? Low vitamin D level 02/12/2017  ? Current smoker 07/25/2014  ? ? ?Past Surgical History:  ?Procedure Laterality Date  ? PYLOROMYOTOMY    ? ? ?OB History   ? ? Gravida  ?4  ? Para  ?4  ? Term  ?4  ? Preterm  ?0  ? AB  ?0  ? Living  ?4  ?  ? ? SAB  ?0  ? IAB  ?0  ? Ectopic  ?0  ? Multiple  ?0  ?  Live Births  ?4  ?   ?  ?  ? ? ? ?Home Medications   ? ?Prior to Admission medications   ?Medication Sig Start Date End Date Taking? Authorizing Provider  ?promethazine-dextromethorphan (PROMETHAZINE-DM) 6.25-15 MG/5ML syrup Take 5 mLs by mouth 4 (four) times daily as needed. 04/30/22  Yes Volney American, PA-C  ?AIMOVIG 70 MG/ML SOAJ Inject 70 mg into the skin every 30 (thirty) days. 01/24/22   [provider]  ?albuterol (PROVENTIL) (2.5 MG/3ML) 0.083% nebulizer solution Take 3 mLs (2.5 mg total) by nebulization every 6 (six) hours as needed for wheezing or shortness of breath. 04/11/22   Sherwood Gambler, MD  ?albuterol (VENTOLIN HFA) 108 (90 Base) MCG/ACT inhaler Inhale 2 puffs into the lungs every 4 (four) hours as needed for wheezing or shortness of breath. 04/11/22   Sherwood Gambler, MD  ?amoxicillin-clavulanate (AUGMENTIN) 875-125 MG tablet Take 1 tablet by mouth every 12 (twelve) hours. 04/09/22   Evalee Jefferson, PA-C  ?budesonide-formoterol (SYMBICORT) 160-4.5 MCG/ACT inhaler Inhale 2 puffs into the lungs 2 (two) times daily. 02/04/22   Dara Hoyer, FNP  ?cetirizine (ZYRTEC) 10 MG tablet Take 1 tablet (10 mg total) by mouth daily. 04/11/22   Dara Hoyer, FNP  ?Cholecalciferol (VITAMIN D3 PO) Take by mouth.  [provider]  ?famotidine (PEPCID) 20 MG tablet TAKE 1 TABLET BY MOUTH TWICE A DAY 04/11/22   Ambs, Kathrine Cords, FNP  ?fluticasone (FLONASE) 50 MCG/ACT nasal spray Place 1-2 sprays into both nostrils daily. 03/19/22   [provider]  ?levocetirizine (XYZAL) 5 MG tablet Take 1 tablet (5 mg total) by mouth every evening. 01/28/22   Jaynee Eagles, PA-C  ?levothyroxine (SYNTHROID) 150 MCG tablet Take 1 tablet (150 mcg total) by mouth daily. 10/19/21   Cassandria Anger, MD  ?medroxyPROGESTERone (DEPO-PROVERA) 150 MG/ML injection Inject 1 mL (150 mg total) into the muscle every 3 (three) months. 07/31/21   Roma Schanz, CNM  ?metoprolol tartrate (LOPRESSOR) 25 MG tablet Take 0.5  tablets (12.5 mg total) by mouth 2 (two) times daily. 10/15/21   Arnoldo Lenis, MD  ?montelukast (SINGULAIR) 10 MG tablet Take 1 tablet (10 mg total) by mouth at bedtime. 02/04/22   Dara Hoyer, FNP  ?omeprazole (PRILOSEC) 40 MG capsule Take 1 capsule (40 mg total) by mouth daily. Take 30-60 min before first meal of the day 04/12/22   Tanda Rockers, MD  ?predniSONE (DELTASONE) 20 MG tablet 2 tabs po daily x 4 days 04/12/22   Sherwood Gambler, MD  ? ? ?Family History ?Family History  ?Problem Relation Age of Onset  ? Arthritis Mother   ? Cancer Mother   ?     thyroid  ? Depression Mother   ? Hyperlipidemia Father   ? Asthma Father   ? Hypertension Father   ? Heart disease Father   ? COPD Maternal Grandfather   ? Diabetes Paternal Grandmother   ? Arthritis Paternal Grandmother   ? Asthma Paternal Grandmother   ? Heart disease Paternal Grandmother   ? Depression Paternal Grandmother   ? Hypertension Paternal Grandmother   ? Hyperlipidemia Paternal Grandmother   ? Other Neg Hx   ? ? ?Social History ?Social History  ? ?Tobacco Use  ? Smoking status: Every Day  ?  Packs/day: 0.25  ?  Years: 5.00  ?  Pack years: 1.25  ?  Types: Cigarettes  ? Smokeless tobacco: Never  ? Tobacco comments:  ?  2 cig/day  ?Vaping Use  ? Vaping Use: Never used  ?Substance Use Topics  ? Alcohol use: Yes  ?  Comment: rarely  ? Drug use: No  ? ? ? ?Allergies   ?Metronidazole ? ? ?Review of Systems ?Review of Systems ?Per HPI ? ?Physical Exam ?Triage Vital Signs ?ED Triage Vitals [04/30/22 1154]  ?Enc Vitals Group  ?   BP 116/78  ?   Pulse Rate 76  ?   Resp 18  ?   Temp 99.8 ?F (37.7 ?C)  ?   Temp Source Oral  ?   SpO2 96 %  ?   Weight   ?   Height   ?   Head Circumference   ?   Peak Flow   ?   Pain Score 4  ?   Pain Loc   ?   Pain Edu?   ?   Excl. in [place]?   ? ?No data found. ? ?Updated Vital Signs ?BP 116/78 (BP Location: Right Arm)   Pulse 76   Temp 99.8 ?F (37.7 ?C) (Oral)   Resp 18   SpO2 96%  ? ?Visual Acuity ?Right Eye Distance:   ?Left  Eye Distance:   ?Bilateral Distance:   ? ?Right Eye Near:   ?Left Eye Near:    ?  Bilateral Near:    ? ?Physical Exam ?Vitals and nursing note reviewed.  ?Constitutional:   ?   Appearance: Normal appearance. She is not ill-appearing.  ?HENT:  ?   Head: Atraumatic.  ?   Right Ear: Tympanic membrane and external ear normal.  ?   Left Ear: Tympanic membrane and external ear normal.  ?   Nose: Rhinorrhea present.  ?   Mouth/Throat:  ?   Mouth: Mucous membranes are moist.  ?   Pharynx: Posterior oropharyngeal erythema present.  ?Eyes:  ?   Extraocular Movements: Extraocular movements intact.  ?   Conjunctiva/sclera: Conjunctivae normal.  ?Cardiovascular:  ?   Rate and Rhythm: Normal rate and regular rhythm.  ?   Heart sounds: Normal heart sounds.  ?Pulmonary:  ?   Effort: Pulmonary effort is normal.  ?   Breath sounds: Normal breath sounds. No wheezing or rales.  ?Musculoskeletal:     ?   General: Normal range of motion.  ?   Cervical back: Normal range of motion and neck supple.  ?Skin: ?   General: Skin is warm and dry.  ?Neurological:  ?   Mental Status: She is alert and oriented to person, place, and time.  ?Psychiatric:     ?   Mood and Affect: Mood normal.     ?   Thought Content: Thought content normal.     ?   Judgment: Judgment normal.  ? ? ? ?UC Treatments / Results  ?Labs ?(all labs ordered are listed, but only abnormal results are displayed) ?Labs Reviewed  ?CULTURE, GROUP A STREP [facility])  ?COVID-19, FLU A+B NAA  ?POCT RAPID STREP A (OFFICE)  ? ? ?EKG ? ? ?Radiology ?No results found. ? ?Procedures ?Procedures (including critical care time) ? ?Medications Ordered in UC ?Medications - No data to display ? ?Initial Impression / Assessment and Plan / UC Course  ?I have reviewed the triage vital signs and the nursing notes. ? ?Pertinent labs & imaging results that were available during my care of the patient were reviewed by me and considered in my medical decision making (see chart for details). ? ?  ? ?Vital signs  reassuring, exam suggestive of a viral upper respiratory infection.  Rapid strep negative, throat culture and COVID and flu testing pending.  Discussed Phenergan DM, supportive over-the-counter medications

## 2022-04-30 NOTE — ED Triage Notes (Signed)
Sore throat and sore throat since yesterday ?

## 2022-05-01 LAB — COVID-19, FLU A+B NAA
Influenza A, NAA: NOT DETECTED
Influenza B, NAA: NOT DETECTED
SARS-CoV-2, NAA: NOT DETECTED

## 2022-05-03 LAB — CULTURE, GROUP A STREP (THRC)

## 2022-05-10 ENCOUNTER — Ambulatory Visit: Payer: Medicaid Other | Admitting: Internal Medicine

## 2022-05-10 NOTE — Progress Notes (Deleted)
Peggy [name], female    DOB: [birth year], 31 y.o.   MRN: [id number]   Brief patient profile:  68   yowf  active smoker with childhood sinus/ear problems intermittently self  referred to pulmonary clinic in [place]  04/12/2022 maint on symbicort 160 2bid and just on saba prn but marked increase in saba hfa and neb assoc with sinus dz flare x 04/05/22 no better p 2 ER trips       History of Present Illness  04/12/2022  Pulmonary/ 1st office eval/ Melvyn Novas / [place] Office  Chief Complaint  Patient presents with   Acute Visit    States ED  told her Tuesday 4/11 was told she had pneumonia and r lung collapsed. Returned to ED yesterday 04/11/2022 and they advised to follow up here. Feels she has not improved at all.   Dyspnea:  worse for several months  then acutely worse 4/7 assoc with nasal congestion and R ant cp > ED  got CT  4/11 with RUL ATX that was better on 4/13 portable film although she says not better with gen p worse on R ant and much worse during coughing fits  Cough: was green / on augmentin now  Sleep: breathing worse so had to sit in chair since 04/08/22  SABA use: neb at 7am prior to [place] home and take your prednisone immediately  - take with food  Plan A = Automatic = Always=  Symbiocort 160 (or breztri) Take 2 puffs first thing in am and then another 2 puffs about 12 hours later.   Work on inhaler technique:   Plan B = Backup (to supplement plan A, not to replace it) Only use your albuterol inhaler as a rescue medication Plan C = Crisis (instead of Plan B but only if Plan B stops working) - only use your albuterol nebulizer if you first try Plan B  Try omeprazole 40 mg  Take 30-60 min before first meal of the day and Pepcid ac (famotidine) 20 mg one after supper and  @  bedtime until cough is completely gone for at least a week without the need for cough suppression For cough / congestion >  mucinex dm 1200 mg every 12 hours supplement with tylenol #3 every 4  hours as needed   Depomedrol 80 mg IM  Stop all smoking Please schedule a follow up office visit in 4 weeks, sooner if needed  with all medications /inhalers/ solutions in hand   05/10/2022  f/u ov/[place] office/Sabina Beavers re: AB maint on ***  No chief complaint on file.   Dyspnea:  *** Cough: *** Sleeping: *** SABA use: *** 02: *** Covid status: *** Lung cancer screening: ***   No obvious day to day or daytime variability or assoc excess/ purulent sputum or mucus plugs or hemoptysis or cp or chest tightness, subjective wheeze or overt sinus or hb symptoms.   *** without nocturnal  or early am exacerbation  of respiratory  c/o's or need for noct saba. Also denies any obvious fluctuation of symptoms with weather or environmental changes or other aggravating or alleviating factors except as outlined above   No unusual exposure hx or h/o childhood pna/ asthma or knowledge of premature birth.  Current Allergies, Complete Past Medical History, Past Surgical History, Family History, and Social History were reviewed in [employer] record.  ROS  The following are not active complaints unless bolded Hoarseness, sore throat, dysphagia, dental problems, itching, sneezing,  nasal congestion or discharge of excess mucus or purulent secretions, ear ache,   fever, chills, sweats, unintended wt loss or wt gain, classically pleuritic or exertional cp,  orthopnea pnd or arm/hand swelling  or leg swelling, presyncope, palpitations, abdominal pain, anorexia, nausea, vomiting, diarrhea  or change in bowel habits or change in bladder habits, change in stools or change in urine, dysuria, hematuria,  rash, arthralgias, visual complaints, headache, numbness, weakness or ataxia or problems with walking or coordination,  change in mood or  memory.        No outpatient medications have been marked as taking for the 05/10/22 encounter (Appointment) with Tanda Rockers, MD.              Objective:       Wt Readings from Last 3 Encounters:  04/12/22 156 lb 6.4 oz (70.9 kg)  04/11/22 155 lb (70.3 kg)  04/09/22 155 lb (70.3 kg)      Vital signs reviewed  05/10/2022  - Note at rest 02 sats  ***% on ***   General appearance:    ***                Assessment

## 2022-05-20 DIAGNOSIS — J342 Deviated nasal septum: Secondary | ICD-10-CM | POA: Diagnosis not present

## 2022-05-20 DIAGNOSIS — J343 Hypertrophy of nasal turbinates: Secondary | ICD-10-CM | POA: Diagnosis not present

## 2022-05-20 DIAGNOSIS — J310 Chronic rhinitis: Secondary | ICD-10-CM | POA: Diagnosis not present

## 2022-05-31 ENCOUNTER — Other Ambulatory Visit (INDEPENDENT_AMBULATORY_CARE_PROVIDER_SITE_OTHER): Payer: Medicaid Other

## 2022-05-31 ENCOUNTER — Other Ambulatory Visit (HOSPITAL_COMMUNITY)
Admission: RE | Admit: 2022-05-31 | Discharge: 2022-05-31 | Disposition: A | Discharge status: Home / Self Care | Payer: Medicaid Other | Source: Ambulatory Visit | Attending: Obstetrics & Gynecology | Admitting: Obstetrics & Gynecology

## 2022-05-31 DIAGNOSIS — R109 Unspecified abdominal pain: Secondary | ICD-10-CM | POA: Insufficient documentation

## 2022-05-31 DIAGNOSIS — M545 Low back pain, unspecified: Secondary | ICD-10-CM | POA: Diagnosis not present

## 2022-05-31 DIAGNOSIS — R8290 Unspecified abnormal findings in urine: Secondary | ICD-10-CM | POA: Diagnosis not present

## 2022-05-31 LAB — POCT URINALYSIS DIPSTICK
Blood, UA: NEGATIVE
Glucose, UA: NEGATIVE
Ketones, UA: NEGATIVE
Leukocytes, UA: NEGATIVE
Nitrite, UA: NEGATIVE
Protein, UA: NEGATIVE

## 2022-05-31 NOTE — Progress Notes (Signed)
   NURSE VISIT- VAGINITIS/STD/UTI  SUBJECTIVE:  Peggy [name] is a 31 y.o. [id number] GYN patientfemale here for a vaginal swab for vaginitis screening, STD screen.  She reports the following symptoms: lower back pain and malodorous urine. Denies abnormal vaginal bleeding, significant pelvic pain, fever.  OBJECTIVE:  There were no vitals taken for this visit.  Appears well, in no apparent distress  ASSESSMENT: Vaginal swab for vaginitis screening/STD screen Malodorous urine-negative nitrates PLAN: Self-collected vaginal probe for Gonorrhea, Chlamydia, Trichomonas, Bacterial Vaginosis, Yeast sent to lab Note routed to Derrek Monaco, AGNP   Rx sent by provider today: No Urine culture sent-pt requested Treatment: to be determined once results are received Follow-up as needed if symptoms persist/worsen, or new symptoms develop  Alice Rieger  05/31/2022 1:15 PM

## 2022-06-01 LAB — URINALYSIS, ROUTINE W REFLEX MICROSCOPIC
Bilirubin, UA: NEGATIVE
Glucose, UA: NEGATIVE
Ketones, UA: NEGATIVE
Leukocytes,UA: NEGATIVE
Nitrite, UA: NEGATIVE
Protein,UA: NEGATIVE
RBC, UA: NEGATIVE
Specific Gravity, UA: 1.018 (ref 1.005–1.030)
Urobilinogen, Ur: 0.2 mg/dL (ref 0.2–1.0)
pH, UA: 5.5 (ref 5.0–7.5)

## 2022-06-02 LAB — URINE CULTURE

## 2022-06-03 ENCOUNTER — Ambulatory Visit
Admission: RE | Admit: 2022-06-03 | Discharge: 2022-06-03 | Disposition: A | Discharge status: Home / Self Care | Payer: Medicaid Other | Source: Ambulatory Visit | Attending: Nurse Practitioner | Admitting: Nurse Practitioner

## 2022-06-03 ENCOUNTER — Ambulatory Visit: Payer: Medicaid Other

## 2022-06-03 ENCOUNTER — Other Ambulatory Visit: Payer: Medicaid Other

## 2022-06-03 ENCOUNTER — Ambulatory Visit (INDEPENDENT_AMBULATORY_CARE_PROVIDER_SITE_OTHER): Payer: Medicaid Other

## 2022-06-03 VITALS — BP 129/85 | HR 96 | Temp 99.2°F | Resp 18

## 2022-06-03 DIAGNOSIS — R109 Unspecified abdominal pain: Secondary | ICD-10-CM | POA: Diagnosis not present

## 2022-06-03 DIAGNOSIS — M546 Pain in thoracic spine: Secondary | ICD-10-CM | POA: Diagnosis not present

## 2022-06-03 DIAGNOSIS — R1032 Left lower quadrant pain: Secondary | ICD-10-CM

## 2022-06-03 LAB — CERVICOVAGINAL ANCILLARY ONLY
Bacterial Vaginitis (gardnerella): NEGATIVE
Candida Glabrata: NEGATIVE
Candida Vaginitis: NEGATIVE
Chlamydia: NEGATIVE
Comment: NEGATIVE
Comment: NEGATIVE
Comment: NEGATIVE
Comment: NEGATIVE
Comment: NEGATIVE
Comment: NORMAL
Neisseria Gonorrhea: NEGATIVE
Trichomonas: NEGATIVE

## 2022-06-03 LAB — POCT URINALYSIS DIP (MANUAL ENTRY)
Bilirubin, UA: NEGATIVE
Glucose, UA: NEGATIVE mg/dL
Ketones, POC UA: NEGATIVE mg/dL
Leukocytes, UA: NEGATIVE
Nitrite, UA: NEGATIVE
Protein Ur, POC: NEGATIVE mg/dL
Spec Grav, UA: 1.025 (ref 1.010–1.025)
Urobilinogen, UA: 0.2 E.U./dL
pH, UA: 5.5 (ref 5.0–8.0)

## 2022-06-03 MED ORDER — ONDANSETRON 4 MG PO TBDP: 4.0000 mg | ORAL_TABLET | Freq: Three times a day (TID) | ORAL | 0 refills | Status: DC | PRN

## 2022-06-03 MED ORDER — CYCLOBENZAPRINE HCL 10 MG PO TABS: 10.0000 mg | ORAL_TABLET | Freq: Two times a day (BID) | ORAL | 0 refills | Status: DC | PRN

## 2022-06-03 MED ORDER — KETOROLAC TROMETHAMINE 60 MG/2ML IM SOLN
60.0000 mg | Freq: Once | INTRAMUSCULAR | Status: AC
  Administered 2022-06-03: 60 mg via INTRAMUSCULAR

## 2022-06-03 MED ORDER — IBUPROFEN 800 MG PO TABS: 800.0000 mg | ORAL_TABLET | Freq: Three times a day (TID) | ORAL | 0 refills | Status: DC | PRN

## 2022-06-03 NOTE — Discharge Instructions (Signed)
Your abdominal x-ray was negative for kidney stones or other abnormalities.  Your urine did show blood, urine culture has been ordered. Take medication as prescribed. As discussed, recommend applying heat or ice as needed.  Ice is for pain or swelling, heat is for spasm or stiffness.  Apply for 20 minutes remove for 1 hour then repeat. Try to remain as active as possible while symptoms persist. If your symptoms do not improve, recommend following up with your primary care physician.

## 2022-06-03 NOTE — ED Provider Notes (Signed)
[facility]    CSN: [id number] Arrival date & time: 06/03/22  1750      History   Chief Complaint Chief Complaint  Patient presents with   Back Pain    Entered by patient    HPI Peggy [name] is a 31 y.o. female.   The history is provided by the patient.  Patient presents for left-sided mid back pain and left-sided abdominal cramping that have been present for the past week.  Patient denies any known injury or trauma.  Pain does radiate down into her lower back.  She denies loss of bowel or bladder function, urinary symptoms, weakness, tingling, or numbness.  Patient states that she did go see her gynecologist last week, her urine was collected, STI testing was also performed, both of which were negative.  Patient states her last menstrual cycle is unknown as she is on the Depo injection.  She states that her bowel movements are usually daily and that she has had 1 today.  She reports that she has been taking naproxen and Tylenol 3 for her symptoms.  Also states that she has used heat.  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   Chronic urticaria 10/12/2020   Recurrent infections 10/12/2020   Seasonal and perennial allergic rhinitis 10/12/2020   Mild persistent asthma, uncomplicated 30/86/5784   Abnormal Pap smear of cervix 06/22/2020   Hypothyroidism following radioiodine  therapy 06/09/2020   Graves' disease 01/24/2020   Depression screening 05/25/2019   History of gestational diabetes 10/28/2018   Low vitamin D level 02/12/2017   Current smoker 07/25/2014    Past Surgical History:  Procedure Laterality Date   PYLOROMYOTOMY      OB History     Gravida  4   Para  4   Term  4   Preterm  0   AB  0   Living  4      SAB  0   IAB  0   Ectopic  0   Multiple  0   Live Births  4            Home Medications    Prior to Admission medications   Medication Sig Start Date End Date Taking? Authorizing Provider  cyclobenzaprine (FLEXERIL) 10 MG tablet Take 1 tablet (10 mg total) by mouth 2 (two) times daily as needed for muscle spasms. 06/03/22  Yes Jenicka Coxe-Warren, Alda Lea, NP  ibuprofen (ADVIL) 800 MG tablet Take 1 tablet (800 mg total) by mouth every 8 (eight) hours as needed. 06/03/22  Yes Cedrica Brune-Warren, Alda Lea, NP  ondansetron (ZOFRAN-ODT) 4 MG disintegrating tablet Take 1 tablet (4 mg total) by mouth every 8 (eight) hours as needed for nausea or vomiting. 06/03/22  Yes Mcgwire Dasaro-Warren, Alda Lea, NP  AIMOVIG 70 MG/ML SOAJ Inject 70 mg into  the skin every 30 (thirty) days. 01/24/22   [provider]  albuterol (PROVENTIL) (2.5 MG/3ML) 0.083% nebulizer solution Take 3 mLs (2.5 mg total) by nebulization every 6 (six) hours as needed for wheezing or shortness of breath. 04/11/22   Sherwood Gambler, MD  albuterol (VENTOLIN HFA) 108 (90 Base) MCG/ACT inhaler Inhale 2 puffs into the lungs every 4 (four) hours as needed for wheezing or shortness of breath. 04/11/22   Sherwood Gambler, MD  budesonide-formoterol [facility]) 160-4.5 MCG/ACT inhaler Inhale 2 puffs into the lungs 2 (two) times daily. 02/04/22   Dara Hoyer, FNP  cetirizine (ZYRTEC) 10 MG tablet Take 1 tablet (10 mg total) by mouth daily. 04/11/22   Dara Hoyer, FNP  Cholecalciferol (VITAMIN D3 PO) Take by mouth.    [provider]  famotidine (PEPCID) 20 MG tablet TAKE 1  TABLET BY MOUTH TWICE A DAY 04/11/22   Ambs, Kathrine Cords, FNP  fluticasone (FLONASE) 50 MCG/ACT nasal spray Place 1-2 sprays into both nostrils daily. 03/19/22   [provider]  levocetirizine (XYZAL) 5 MG tablet Take 1 tablet (5 mg total) by mouth every evening. 01/28/22   Jaynee Eagles, PA-C  levothyroxine (SYNTHROID) 150 MCG tablet Take 1 tablet (150 mcg total) by mouth daily. 10/19/21   Cassandria Anger, MD  medroxyPROGESTERone (DEPO-PROVERA) 150 MG/ML injection Inject 1 mL (150 mg total) into the muscle every 3 (three) months. 07/31/21   Roma Schanz, CNM  metoprolol tartrate (LOPRESSOR) 25 MG tablet Take 0.5 tablets (12.5 mg total) by mouth 2 (two) times daily. 10/15/21   Arnoldo Lenis, MD  montelukast (SINGULAIR) 10 MG tablet Take 1 tablet (10 mg total) by mouth at bedtime. 02/04/22   Dara Hoyer, FNP  omeprazole (PRILOSEC) 40 MG capsule Take 1 capsule (40 mg total) by mouth daily. Take 30-60 min before first meal of the day 04/12/22   Tanda Rockers, MD    Family History Family History  Problem Relation Age of Onset   Arthritis Mother    Cancer Mother        thyroid   Depression Mother    Hyperlipidemia Father    Asthma Father    Hypertension Father    Heart disease Father    COPD Maternal Grandfather    Diabetes Paternal Grandmother    Arthritis Paternal Grandmother    Asthma Paternal Grandmother    Heart disease Paternal Grandmother    Depression Paternal Grandmother    Hypertension Paternal Grandmother    Hyperlipidemia Paternal Grandmother    Other Neg Hx     Social History Social History   Tobacco Use   Smoking status: Every Day    Packs/day: 0.25    Years: 5.00    Pack years: 1.25    Types: Cigarettes   Smokeless tobacco: Never   Tobacco comments:    2 cig/day  Vaping Use   Vaping Use: Never used  Substance Use Topics   Alcohol use: Yes    Comment: rarely   Drug use: No     Allergies   Metronidazole   Review of Systems Review of  Systems Per HPI  Physical Exam Triage Vital Signs ED Triage Vitals  Enc Vitals Group     BP 06/03/22 1816 129/85     Pulse Rate 06/03/22 1816 96     Resp 06/03/22 1816 18     Temp 06/03/22 1816 99.2 F (37.3 C)     Temp src --  SpO2 06/03/22 1816 96 %     Weight --      Height --      Head Circumference --      Peak Flow --      Pain Score 06/03/22 1817 8     Pain Loc --      Pain Edu? --      Excl. in [place]? --    No data found.  Updated Vital Signs BP 129/85   Pulse 96   Temp 99.2 F (37.3 C)   Resp 18   SpO2 96%   Visual Acuity Right Eye Distance:   Left Eye Distance:   Bilateral Distance:    Right Eye Near:   Left Eye Near:    Bilateral Near:     Physical Exam   UC Treatments / Results  Labs (all labs ordered are listed, but only abnormal results are displayed) Labs Reviewed  POCT URINALYSIS DIP (MANUAL ENTRY) - Abnormal; Notable for the following components:      Result Value   Clarity, UA hazy (*)    Blood, UA trace-intact (*)    All other components within normal limits    EKG   Radiology DG Abd 1 View  Result Date: 06/03/2022 CLINICAL DATA:  Left-sided abdominal and back pain over the last week. EXAM: ABDOMEN - 1 VIEW COMPARISON:  None Available. FINDINGS: Bowel gas pattern is within normal limits. No evidence of ileus or obstruction. Amount of fecal matter within the range of normal. No abnormal calcifications or significant bone findings. Minimal scoliotic curvature of the spine. IMPRESSION: No cause of the clinical presentation is identified. Minimal spinal curvature. Electronically Signed   By: Nelson Chimes M.D.   On: 06/03/2022 18:50    Procedures Procedures (including critical care time)  Medications Ordered in UC Medications  ketorolac (TORADOL) injection 60 mg (has no administration in time range)    Initial Impression / Assessment and Plan / UC Course  I have reviewed the triage vital signs and the nursing notes.  Pertinent  labs & imaging results that were available during my care of the patient were reviewed by me and considered in my medical decision making (see chart for details).  Patient presents for complaints of left-sided mid back pain and abdominal cramping.  Symptoms have been present for the past week.  Her vital signs and exam are reassuring at this time.  She does have point tenderness to the paraspinal region of her thoracic spine.  Her urinalysis is positive for blood.  Abdominal x-ray did not indicate any indications of nephrolithiasis.  Difficult to determine the cause of the patient's back pain at this time.  Symptomatic treatment was provided in the clinic today with a Toradol injection.  We will also prescribe ibuprofen and cyclobenzaprine for her symptoms.  Patient was provided supportive care recommendations Final Clinical Impressions(s) / UC Diagnoses   Final diagnoses:  Pain in thoracic spine  Left lower quadrant abdominal pain     Discharge Instructions      Your abdominal x-ray was negative for kidney stones or other abnormalities.  Your urine did show blood, urine culture has been ordered. Take medication as prescribed. As discussed, recommend applying heat or ice as needed.  Ice is for pain or swelling, heat is for spasm or stiffness.  Apply for 20 minutes remove for 1 hour then repeat. Try to remain as active as possible while symptoms persist. If your symptoms do not improve, recommend following up with  your primary care physician.     ED Prescriptions     Medication Sig Dispense Auth. Provider   ibuprofen (ADVIL) 800 MG tablet Take 1 tablet (800 mg total) by mouth every 8 (eight) hours as needed. 30 tablet Shaila Gilchrest-Warren, Alda Lea, NP   cyclobenzaprine (FLEXERIL) 10 MG tablet Take 1 tablet (10 mg total) by mouth 2 (two) times daily as needed for muscle spasms. 20 tablet Tol[place]ope Pinkett-Warren, Alda Lea, NP   ondansetron (ZOFRAN-ODT) 4 MG disintegrating tablet Take 1 tablet (4 mg total)  by mouth every 8 (eight) hours as needed for nausea or vomiting. 20 tablet Mela Perham-Warren, Alda Lea, NP      PDMP not reviewed this encounter.   Tish Men, NP 06/03/22 1904

## 2022-06-05 ENCOUNTER — Emergency Department (HOSPITAL_COMMUNITY): Payer: Medicaid Other

## 2022-06-05 ENCOUNTER — Encounter (HOSPITAL_COMMUNITY): Payer: Self-pay

## 2022-06-05 ENCOUNTER — Other Ambulatory Visit: Payer: Self-pay

## 2022-06-05 ENCOUNTER — Emergency Department (HOSPITAL_COMMUNITY)
Admission: EM | Admit: 2022-06-05 | Discharge: 2022-06-05 | Disposition: A | Discharge status: Home / Self Care | Payer: Medicaid Other | Attending: Emergency Medicine | Admitting: Emergency Medicine

## 2022-06-05 DIAGNOSIS — D279 Benign neoplasm of unspecified ovary: Secondary | ICD-10-CM | POA: Diagnosis not present

## 2022-06-05 DIAGNOSIS — R1032 Left lower quadrant pain: Secondary | ICD-10-CM | POA: Diagnosis not present

## 2022-06-05 DIAGNOSIS — D271 Benign neoplasm of left ovary: Secondary | ICD-10-CM | POA: Diagnosis not present

## 2022-06-05 DIAGNOSIS — K828 Other specified diseases of gallbladder: Secondary | ICD-10-CM | POA: Diagnosis not present

## 2022-06-05 DIAGNOSIS — N83202 Unspecified ovarian cyst, left side: Secondary | ICD-10-CM | POA: Diagnosis not present

## 2022-06-05 DIAGNOSIS — M549 Dorsalgia, unspecified: Secondary | ICD-10-CM | POA: Insufficient documentation

## 2022-06-05 DIAGNOSIS — D72829 Elevated white blood cell count, unspecified: Secondary | ICD-10-CM | POA: Diagnosis not present

## 2022-06-05 DIAGNOSIS — N838 Other noninflammatory disorders of ovary, fallopian tube and broad ligament: Secondary | ICD-10-CM | POA: Diagnosis not present

## 2022-06-05 LAB — PREGNANCY, URINE: Preg Test, Ur: NEGATIVE

## 2022-06-05 LAB — COMPREHENSIVE METABOLIC PANEL
ALT: 15 U/L (ref 0–44)
AST: 17 U/L (ref 15–41)
Albumin: 4.1 g/dL (ref 3.5–5.0)
Alkaline Phosphatase: 65 U/L (ref 38–126)
Anion gap: 7 (ref 5–15)
BUN: 14 mg/dL (ref 6–20)
CO2: 22 mmol/L (ref 22–32)
Calcium: 9.3 mg/dL (ref 8.9–10.3)
Chloride: 109 mmol/L (ref 98–111)
Creatinine, Ser: 0.82 mg/dL (ref 0.44–1.00)
GFR, Estimated: >60 mL/min (ref >60)
Glucose, Bld: 129 mg/dL — ABNORMAL HIGH (ref 70–99)
Potassium: 4 mmol/L (ref 3.5–5.1)
Sodium: 138 mmol/L (ref 135–145)
Total Bilirubin: 0.4 mg/dL (ref 0.3–1.2)
Total Protein: 7.5 g/dL (ref 6.5–8.1)

## 2022-06-05 LAB — CBC
HCT: 38.3 % (ref 36.0–46.0)
Hemoglobin: 12.7 g/dL (ref 12.0–15.0)
MCH: 31.3 pg (ref 26.0–34.0)
MCHC: 33.2 g/dL (ref 30.0–36.0)
MCV: 94.3 fL (ref 80.0–100.0)
Platelets: 336 10*3/uL (ref 150–400)
RBC: 4.06 MIL/uL (ref 3.87–5.11)
RDW: 14 % (ref 11.5–15.5)
WBC: 10.6 10*3/uL — ABNORMAL HIGH (ref 4.0–10.5)
nRBC: 0 % (ref 0.0–0.2)

## 2022-06-05 LAB — URINALYSIS, ROUTINE W REFLEX MICROSCOPIC
Bilirubin Urine: NEGATIVE
Glucose, UA: NEGATIVE mg/dL
Hgb urine dipstick: NEGATIVE
Ketones, ur: NEGATIVE mg/dL
Leukocytes,Ua: NEGATIVE
Nitrite: NEGATIVE
Protein, ur: NEGATIVE mg/dL
Specific Gravity, Urine: 1.014 (ref 1.005–1.030)
pH: 7 (ref 5.0–8.0)

## 2022-06-05 LAB — LIPASE, BLOOD: Lipase: 28 U/L (ref 11–51)

## 2022-06-05 MED ORDER — ACETAMINOPHEN 500 MG PO TABS
1000.0000 mg | ORAL_TABLET | Freq: Once | ORAL | Status: AC
  Administered 2022-06-05: 1000 mg via ORAL
  Filled 2022-06-05: qty 2

## 2022-06-05 NOTE — ED Triage Notes (Signed)
Patient with complaints of back pain for over a week with left lower abdominal pain. She states she has been to OB/GYN and urgent care without relief.

## 2022-06-05 NOTE — ED Provider Notes (Signed)
[facility] Provider Note   CSN: [id number] Arrival date & time: 06/05/22  6010     History  Chief Complaint  Patient presents with   Back Pain    Peggy [name] is a 31 y.o. female.  HPI 31 year old female presents with back pain.  Is been ongoing for about a week.  It is a constant sensation.  Its in her left back, sometimes feels like it moves to the middle.  She also has concomitant left-sided abdominal cramping.  No position makes these worse.  There is no radicular symptoms down her legs or into her abdomen.  No numbness or weakness in her extremities.  No fevers during this time though she has noticed a temp up to 99.  No intravenous drug abuse.  She has tried Tylenol No. 3 and was given a muscle relaxer and Toradol at urgent care.  She went to her OB/GYN before this when this for started and had STI testing and a urinalysis.  She has checked pregnancy test and it was negative.  There is no pleuritic symptoms or shortness of breath/chest pain.  Everything feels like it is below her ribs.  Home Medications Prior to Admission medications   Medication Sig Start Date End Date Taking? Authorizing Provider  AIMOVIG 70 MG/ML SOAJ Inject 70 mg into the skin every 30 (thirty) days. 01/24/22   [provider]  albuterol (PROVENTIL) (2.5 MG/3ML) 0.083% nebulizer solution Take 3 mLs (2.5 mg total) by nebulization every 6 (six) hours as needed for wheezing or shortness of breath. 04/11/22   Sherwood Gambler, MD  albuterol (VENTOLIN HFA) 108 (90 Base) MCG/ACT inhaler Inhale 2 puffs into the lungs every 4 (four) hours as needed for wheezing or shortness of breath. 04/11/22   Sherwood Gambler, MD  budesonide-formoterol [facility]) 160-4.5 MCG/ACT inhaler Inhale 2 puffs into the lungs 2 (two) times daily. 02/04/22   Dara Hoyer, FNP  cetirizine (ZYRTEC) 10 MG tablet Take 1 tablet (10 mg total) by mouth daily. 04/11/22   Dara Hoyer, FNP  Cholecalciferol (VITAMIN D3 PO)  Take by mouth.    [provider]  cyclobenzaprine (FLEXERIL) 10 MG tablet Take 1 tablet (10 mg total) by mouth 2 (two) times daily as needed for muscle spasms. 06/03/22   Leath-Warren, Alda Lea, NP  famotidine (PEPCID) 20 MG tablet TAKE 1 TABLET BY MOUTH TWICE A DAY 04/11/22   Ambs, Kathrine Cords, FNP  fluticasone (FLONASE) 50 MCG/ACT nasal spray Place 1-2 sprays into both nostrils daily. 03/19/22   [provider]  ibuprofen (ADVIL) 800 MG tablet Take 1 tablet (800 mg total) by mouth every 8 (eight) hours as needed. 06/03/22   Leath-Warren, Alda Lea, NP  levocetirizine (XYZAL) 5 MG tablet Take 1 tablet (5 mg total) by mouth every evening. 01/28/22   Jaynee Eagles, PA-C  levothyroxine (SYNTHROID) 150 MCG tablet Take 1 tablet (150 mcg total) by mouth daily. 10/19/21   Cassandria Anger, MD  medroxyPROGESTERone (DEPO-PROVERA) 150 MG/ML injection Inject 1 mL (150 mg total) into the muscle every 3 (three) months. 07/31/21   Roma Schanz, CNM  metoprolol tartrate (LOPRESSOR) 25 MG tablet Take 0.5 tablets (12.5 mg total) by mouth 2 (two) times daily. 10/15/21   Arnoldo Lenis, MD  montelukast (SINGULAIR) 10 MG tablet Take 1 tablet (10 mg total) by mouth at bedtime. 02/04/22   Dara Hoyer, FNP  omeprazole (PRILOSEC) 40 MG capsule Take 1 capsule (40 mg total) by mouth daily.  Take 30-60 min before first meal of the day 04/12/22   Tanda Rockers, MD  ondansetron (ZOFRAN-ODT) 4 MG disintegrating tablet Take 1 tablet (4 mg total) by mouth every 8 (eight) hours as needed for nausea or vomiting. 06/03/22   Leath-Warren, Alda Lea, NP      Allergies    Metronidazole    Review of Systems   Review of Systems  Constitutional:  Negative for fever.  Respiratory:  Negative for shortness of breath.   Cardiovascular:  Negative for chest pain.  Gastrointestinal:  Positive for abdominal pain.  Genitourinary:  Negative for dysuria.  Musculoskeletal:  Positive for back pain.  Neurological:  Negative  for weakness and numbness.   Physical Exam Updated Vital Signs BP 115/74   Pulse 77   Temp 99 F (37.2 C) (Oral)   Resp 20   Ht '5\' 1"'$  (1.549 m)   Wt 72.6 kg   SpO2 98%   BMI 30.23 kg/m  Physical Exam Vitals and nursing note reviewed.  Constitutional:      Appearance: She is well-developed.  HENT:     Head: Normocephalic and atraumatic.  Cardiovascular:     Rate and Rhythm: Normal rate and regular rhythm.     Heart sounds: Normal heart sounds.  Pulmonary:     Effort: Pulmonary effort is normal.     Breath sounds: Normal breath sounds.  Abdominal:     Palpations: Abdomen is soft.     Tenderness: There is abdominal tenderness (mild) in the left lower quadrant. There is left CVA tenderness.     Comments: No rash seen to left CVA  Musculoskeletal:     Thoracic back: No bony tenderness.     Lumbar back: No bony tenderness.       Back:  Skin:    General: Skin is warm and dry.  Neurological:     Mental Status: She is alert.     Comments: 5/5 strength in both lower extremities.  Grossly normal sensation.    ED Results / Procedures / Treatments   Labs (all labs ordered are listed, but only abnormal results are displayed) Labs Reviewed  COMPREHENSIVE METABOLIC PANEL - Abnormal; Notable for the following components:      Result Value   Glucose, Bld 129 (*)    All other components within normal limits  CBC - Abnormal; Notable for the following components:   WBC 10.6 (*)    All other components within normal limits  URINALYSIS, ROUTINE W REFLEX MICROSCOPIC - Abnormal; Notable for the following components:   APPearance CLOUDY (*)    All other components within normal limits  LIPASE, BLOOD  PREGNANCY, URINE    EKG None  Radiology DG Abd 1 View  Result Date: 06/03/2022 CLINICAL DATA:  Left-sided abdominal and back pain over the last week. EXAM: ABDOMEN - 1 VIEW COMPARISON:  None Available. FINDINGS: Bowel gas pattern is within normal limits. No evidence of ileus or  obstruction. Amount of fecal matter within the range of normal. No abnormal calcifications or significant bone findings. Minimal scoliotic curvature of the spine. IMPRESSION: No cause of the clinical presentation is identified. Minimal spinal curvature. Electronically Signed   By: Nelson Chimes M.D.   On: 06/03/2022 18:50   CT Renal Stone Study  Result Date: 06/05/2022 CLINICAL DATA:  Left-sided back pain x1 week with left lower abdominal pain concern for renal stone. EXAM: CT ABDOMEN AND PELVIS WITHOUT CONTRAST TECHNIQUE: Multidetector CT imaging of the abdomen and pelvis was  performed following the standard protocol without IV contrast. RADIATION DOSE REDUCTION: This exam was performed according to the departmental dose-optimization program which includes automated exposure control, adjustment of the mA and/or kV according to patient size and/or use of iterative reconstruction technique. COMPARISON:  CT May 15, 2010 and pelvic ultrasound January 19, 2020 FINDINGS: Lower chest: No acute abnormality. Hepatobiliary: Unremarkable noncontrast appearance of the hepatic parenchyma. Gallbladder is distended with possible wall thickening. No biliary ductal dilation. Pancreas: No pancreatic ductal dilation or evidence of acute inflammation. Spleen: No splenomegaly or focal splenic lesion. Adrenals/Urinary Tract: Bilateral adrenal glands appear normal. No hydronephrosis. No renal, ureteral or bladder calculi identified. Urinary bladder is unremarkable for degree of distension. Stomach/Bowel: No radiopaque enteric contrast material was administered. Stomach is unremarkable for degree of distension. No pathologic dilation of large or small bowel. The appendix and terminal ileum appear normal. No evidence of acute bowel inflammation. Vascular/Lymphatic: Normal caliber abdominal aorta. No pathologically enlarged abdominal or pelvic lymph nodes. Reproductive: 14 mm left ovarian lesion contains macroscopic fat and  calcifications, previously measuring 9 mm on pelvic ultrasound January 19, 2020 consistent with an ovarian dermoid. Right ovary and uterus are unremarkable. Other: Trace pelvic free fluid is within physiologic normal limits. Musculoskeletal: No acute osseous abnormality. IMPRESSION: 1. Distended gallbladder with possible wall thickening, consider further evaluation with right upper quadrant tenderness to palpation and possibly right upper quadrant ultrasound if clinically indicated. 2. No evidence of obstructive uropathy, bowel obstruction or acute bowel inflammation. 3. 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 Electronically Signed   By: Dahlia Bailiff M.D.   On: 06/05/2022 10:52   US PELVIC COMPLETE W TRANSVAGINAL AND TORSION R/O  Result Date: 06/05/2022 CLINICAL DATA:  Left ovarian cyst EXAM: TRANSABDOMINAL AND TRANSVAGINAL ULTRASOUND OF PELVIS DOPPLER ULTRASOUND OF OVARIES TECHNIQUE: Both transabdominal and transvaginal ultrasound examinations of the pelvis were performed. Transabdominal technique was performed for global imaging of the pelvis including uterus, ovaries, adnexal regions, and pelvic cul-de-sac. It was necessary to proceed with endovaginal exam following the transabdominal exam to visualize the ovaries and endometrium. Color and duplex Doppler ultrasound was utilized to evaluate blood flow to the ovaries. COMPARISON:  Same day CT FINDINGS: Uterus Measurements: 7.6 x 2.9 x 5.0 cm = volume: 57.5 mL. No fibroids or other mass visualized. Endometrium Thickness: 6.6 mm. There is 5 x 3 x 3 mm hyperechogenicity within endometrium, nonspecific but could be a small calcification or gas related to transvaginal probing. Right ovary Measurements: 2.5 x 2.0 x 3.5 cm = volume: 8.9 mL. Normal appearance/no adnexal mass. Left ovary Measurements: 2.8 x 2.1 x 2.5 cm = volume: 7.6 mL. There is a hyperechoic mass measuring 2.0 x 1.6 x 1.9 cm. Pulsed Doppler evaluation of both ovaries demonstrates normal low-resistance arterial  and venous waveforms. Other findings No abnormal free fluid. 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  No evidence of ovarian torsion. Electronically Signed   By: Maurine Simmering M.D.   On: 06/05/2022 12:48    Procedures Procedures    Medications Ordered in ED Medications  acetaminophen (TYLENOL) tablet 1,000 mg (1,000 mg Oral Given 06/05/22 1018)    ED Course/ Medical Decision Making/ A&P                           Medical Decision Making Amount and/or Complexity of Data Reviewed External Data Reviewed: notes. Labs: ordered. Radiology: ordered and independent interpretation performed.  Risk OTC drugs.   Patient is due for  a sinus surgery tomorrow and does not want a thing stronger than Tylenol as she was told not to take anything but Tylenol the day before.  She was given oral Tylenol here with some partial relief.  As far as what the cause of her presentation is, probably is from the ovarian cyst.  CT images obtained given locations of her pain I personally viewed/interpret these images and there is a left ovarian cyst without other obvious cause of pain such as kidney stone or diverticulitis.  Labs show no UTI and she is not pregnant.  WBC is slightly elevated but very nonspecific at this point.  Otherwise an ultrasound was obtained to rule out torsion and this is negative.  She feels well enough for discharge.  I have low concern for CNS emergency such as cauda equina.  Will discharge home with return precautions.        Final Clinical Impression(s) / ED Diagnoses Final diagnoses:  Cyst, ovary, dermoid, left    Rx / DC Orders ED Discharge Orders     None         Sherwood Gambler, MD 06/05/22 1526

## 2022-06-05 NOTE — Discharge Instructions (Signed)
If you develop worsening, continued, or recurrent abdominal pain, uncontrolled vomiting, fever, chest or back pain, or any other new/concerning symptoms then return to the ER for evaluation.  

## 2022-06-06 DIAGNOSIS — J342 Deviated nasal septum: Secondary | ICD-10-CM | POA: Diagnosis not present

## 2022-06-06 DIAGNOSIS — J343 Hypertrophy of nasal turbinates: Secondary | ICD-10-CM | POA: Diagnosis not present

## 2022-06-06 DIAGNOSIS — J3489 Other specified disorders of nose and nasal sinuses: Secondary | ICD-10-CM | POA: Diagnosis not present

## 2022-06-10 ENCOUNTER — Ambulatory Visit: Payer: Medicaid Other | Admitting: Obstetrics & Gynecology

## 2022-06-10 NOTE — Telephone Encounter (Signed)
The ovary cyst is 2 cm and not causing her pain, it is a dermoid.  If she is having sinus surgery pain then she would need to touch base with the folks that did that surgery.  She does not have a gyn source of pain

## 2022-06-11 ENCOUNTER — Other Ambulatory Visit: Payer: Self-pay | Admitting: Obstetrics & Gynecology

## 2022-06-11 MED ORDER — KETOROLAC TROMETHAMINE 10 MG PO TABS: 10.0000 mg | ORAL_TABLET | Freq: Three times a day (TID) | ORAL | 0 refills | Status: DC | PRN

## 2022-06-11 MED ORDER — CYCLOBENZAPRINE HCL 10 MG PO TABS: 10.0000 mg | ORAL_TABLET | Freq: Three times a day (TID) | ORAL | 1 refills | Status: DC | PRN

## 2022-06-20 ENCOUNTER — Ambulatory Visit (INDEPENDENT_AMBULATORY_CARE_PROVIDER_SITE_OTHER): Payer: Medicaid Other | Admitting: Adult Health

## 2022-06-20 ENCOUNTER — Encounter: Payer: Self-pay | Admitting: Adult Health

## 2022-06-20 VITALS — BP 131/84 | HR 93 | Ht 60.0 in | Wt 160.2 lb

## 2022-06-20 DIAGNOSIS — Z0000 Encounter for general adult medical examination without abnormal findings: Secondary | ICD-10-CM

## 2022-06-20 DIAGNOSIS — Z01419 Encounter for gynecological examination (general) (routine) without abnormal findings: Secondary | ICD-10-CM | POA: Insufficient documentation

## 2022-06-20 DIAGNOSIS — Z3042 Encounter for surveillance of injectable contraceptive: Secondary | ICD-10-CM

## 2022-06-20 MED ORDER — MEDROXYPROGESTERONE ACETATE 150 MG/ML IM SUSP: 150.0000 mg | INTRAMUSCULAR | 3 refills | Status: DC

## 2022-06-20 NOTE — Progress Notes (Signed)
Patient ID: Peggy [name], female   DOB: [birth year], 31 y.o.   MRN: [id number] History of Present Illness: Peggy [name] is a 31 year old white female, divorced, G4P4 in for a well woman gyn exam.  Lab Results  Component Value Date   DIAGPAP  06/18/2021    - Negative for Intraepithelial Lesions or Malignancy (NILM)   DIAGPAP - Benign reactive/reparative changes 06/18/2021   [place] Negative 06/18/2021   PCP is [facility].  Current Medications, Allergies, Past Medical History, Past Surgical History, Family History and Social History were reviewed in [employer] record.     Review of Systems: Patient denies any headaches, hearing loss, fatigue, blurred vision, shortness of breath, chest pain, abdominal pain, problems with bowel movements, urination, or intercourse. No joint pain or mood swings.  Had pain in back and left side and seen in ER has left dermoid cyst. She is happy with depo   Physical Exam:BP 131/84 (BP Location: Left Arm, Patient Position: Sitting, Cuff Size: Normal)   Pulse 93   Ht 5' (1.524 m)   Wt 160 lb 3.2 oz (72.7 kg)   BMI 31.29 kg/m   General:  Well developed, well nourished, no acute distress Skin:  Warm and dry Neck:  Midline trachea, normal thyroid, good ROM, no lymphadenopathy Lungs; Clear to auscultation bilaterally Breast:  No dominant palpable mass, retraction, or nipple discharge Cardiovascular: Regular rate and rhythm Abdomen:  Soft, non tender, no hepatosplenomegaly Pelvic:  External genitalia is normal in appearance, no lesions.  The vagina is normal in appearance. Urethra has no lesions or masses. The cervix is bulbous.  Uterus is felt to be normal size, shape, and contour.  No adnexal masses or tenderness noted.Bladder is non tender, no masses felt. Rectal: Deferred Extremities/musculoskeletal:  No swelling or varicosities noted, no clubbing or cyanosis Psych:  No mood changes, alert and cooperative,seems happy AA is 1 Fall  risk is low    06/20/2022   11:44 AM 06/18/2021    3:44 PM 06/16/2020   12:40 PM  Depression screen PHQ 2/9  Decreased Interest 0 1 1  Down, Depressed, Hopeless 1 1 0  PHQ - 2 Score '1 2 1  '$ Altered sleeping '2 1 1  '$ Tired, decreased energy '1 1 1  '$ Change in appetite '1 1 1  '$ Feeling bad or failure about yourself  0 1 0  Trouble concentrating '1 1 1  '$ Moving slowly or fidgety/restless 0 1 0  Suicidal thoughts 0 0 0  PHQ-9 Score '6 8 5  '$ Difficult doing work/chores   Not difficult at all       06/20/2022   11:44 AM 06/18/2021    3:44 PM 06/16/2020   12:40 PM  GAD 7 : Generalized Anxiety Score  Nervous, Anxious, on Edge '1 1 1  '$ Control/stop worrying 0 1 0  Worry too much - different things 0 1 0  Trouble relaxing 0 1 0  Restless 0 1 0  Easily annoyed or irritable '1 1 1  '$ Afraid - awful might happen 0 0 0  Total GAD 7 Score '2 6 2  '$ Anxiety Difficulty   Not difficult at all    Upstream - 06/20/22 1144       Pregnancy Intention Screening   Does the patient want to become pregnant in the next year? No    Does the patient's partner want to become pregnant in the next year? No    Would the patient like to discuss contraceptive options today? No  Contraception Wrap Up   Current Method Hormonal Injection    End Method Hormonal Injection    Contraception Counseling Provided No            Examination chaperoned by Celene Squibb LPN    Impression and Plan: 1. Encounter for well woman exam with routine gynecological exam Physical in 1 year Pap in 2025 Labs with PCP  2. Encounter for surveillance of injectable contraceptive Will refill depo Meds ordered this encounter  Medications   medroxyPROGESTERone (DEPO-PROVERA) 150 MG/ML injection    Sig: Inject 1 mL (150 mg total) into the muscle every 3 (three) months.    Dispense:  1 mL    Refill:  3    Order Specific Question:   Supervising Provider    Answer:   Florian Buff [[id number]]   Next injection 07/12/22, has appt

## 2022-07-05 ENCOUNTER — Other Ambulatory Visit: Payer: Self-pay | Admitting: Obstetrics & Gynecology

## 2022-07-08 DIAGNOSIS — G43711 Chronic migraine without aura, intractable, with status migrainosus: Secondary | ICD-10-CM | POA: Diagnosis not present

## 2022-07-08 DIAGNOSIS — Z79899 Other long term (current) drug therapy: Secondary | ICD-10-CM | POA: Diagnosis not present

## 2022-07-08 DIAGNOSIS — E669 Obesity, unspecified: Secondary | ICD-10-CM | POA: Diagnosis not present

## 2022-07-08 DIAGNOSIS — G930 Cerebral cysts: Secondary | ICD-10-CM | POA: Diagnosis not present

## 2022-07-12 ENCOUNTER — Ambulatory Visit (INDEPENDENT_AMBULATORY_CARE_PROVIDER_SITE_OTHER): Payer: Medicaid Other | Admitting: *Deleted

## 2022-07-12 DIAGNOSIS — Z3202 Encounter for pregnancy test, result negative: Secondary | ICD-10-CM | POA: Diagnosis not present

## 2022-07-12 DIAGNOSIS — Z3042 Encounter for surveillance of injectable contraceptive: Secondary | ICD-10-CM | POA: Diagnosis not present

## 2022-07-12 LAB — POCT URINE PREGNANCY: Preg Test, Ur: NEGATIVE

## 2022-07-12 MED ORDER — MEDROXYPROGESTERONE ACETATE 150 MG/ML IM SUSP
150.0000 mg | Freq: Once | INTRAMUSCULAR | Status: AC
  Administered 2022-07-12: 150 mg via INTRAMUSCULAR

## 2022-07-12 NOTE — Progress Notes (Signed)
   NURSE VISIT- INJECTION  SUBJECTIVE:  Peggy [name] is a 31 y.o. [phone] female here for a Depo Provera for contraception/period management. She is a GYN patient. Requesting a pregnancy test as she had a dream last night that she was pregnant and someone this morning told her she "looked" pregnant.  OBJECTIVE:  There were no vitals taken for this visit.  Appears well, in no apparent distress  Injection administered in: Left deltoid  Meds ordered this encounter  Medications   medroxyPROGESTERone (DEPO-PROVERA) injection 150 mg    ASSESSMENT: GYN patientDepo Provera for contraception/period management PLAN: Follow-up: in 11-13 weeks for next Depo   Alice Rieger  07/12/2022 11:56 AM

## 2022-07-17 ENCOUNTER — Other Ambulatory Visit: Payer: Self-pay | Admitting: Internal Medicine

## 2022-07-22 ENCOUNTER — Other Ambulatory Visit: Payer: Self-pay | Admitting: *Deleted

## 2022-07-22 ENCOUNTER — Encounter: Payer: Self-pay | Admitting: Family Medicine

## 2022-07-22 MED ORDER — VENTOLIN HFA 108 (90 BASE) MCG/ACT IN AERS: 2.0000 | INHALATION_SPRAY | Freq: Four times a day (QID) | RESPIRATORY_TRACT | 1 refills | Status: DC | PRN

## 2022-07-24 ENCOUNTER — Other Ambulatory Visit: Payer: Self-pay | Admitting: "Endocrinology

## 2022-07-24 ENCOUNTER — Telehealth: Payer: Self-pay | Admitting: "Endocrinology

## 2022-07-24 DIAGNOSIS — E890 Postprocedural hypothyroidism: Secondary | ICD-10-CM

## 2022-07-24 NOTE — Telephone Encounter (Signed)
Can you update lab order for [facility]?

## 2022-08-07 ENCOUNTER — Ambulatory Visit: Payer: Medicaid Other | Admitting: "Endocrinology

## 2022-08-09 IMAGING — DX DG CHEST 2V
2 series · 2 of 2 positions shown · non-contrast
Comparison: 12/18/2021

CLINICAL DATA: Cough and chest pain and shortness of breath

EXAM:
CHEST - 2 VIEW

[chest pa]
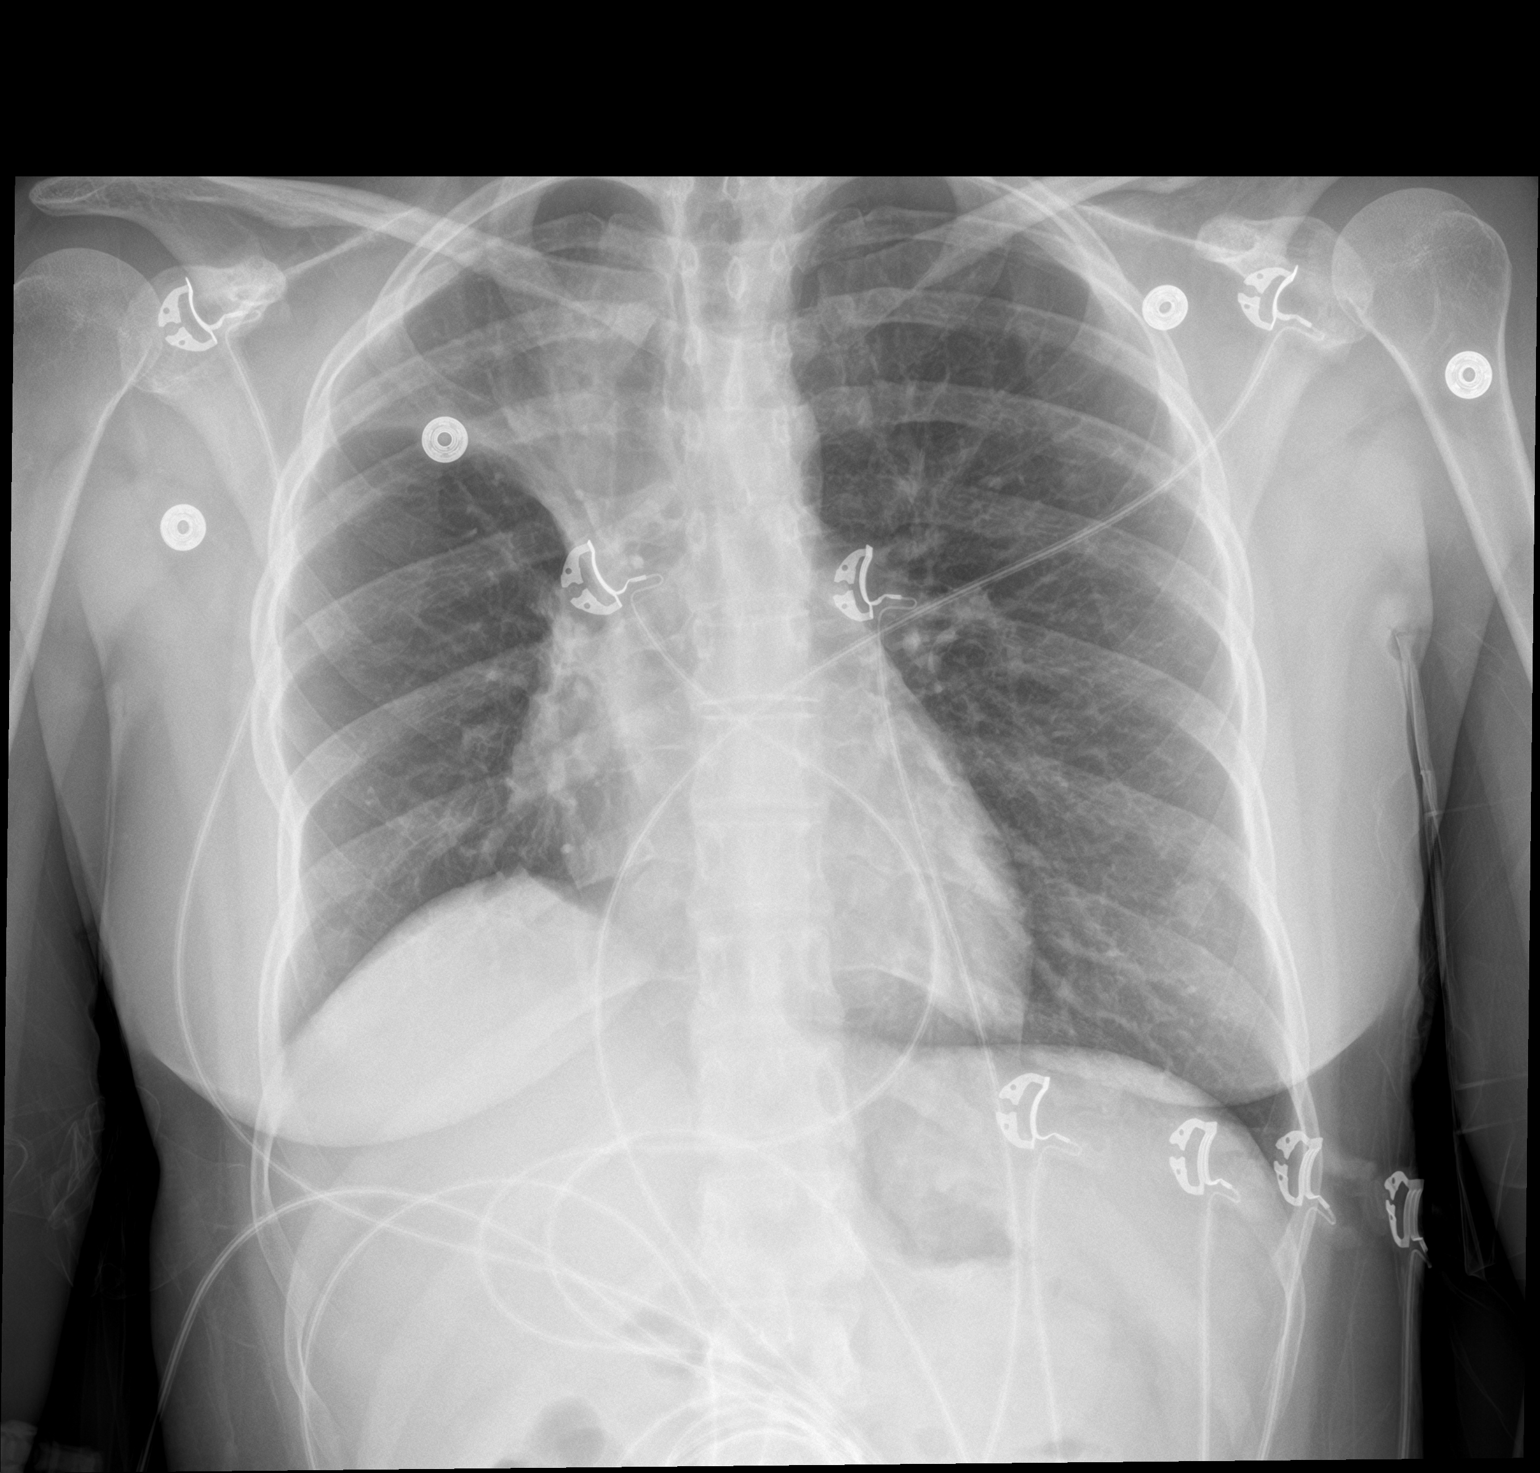

[chest lat]
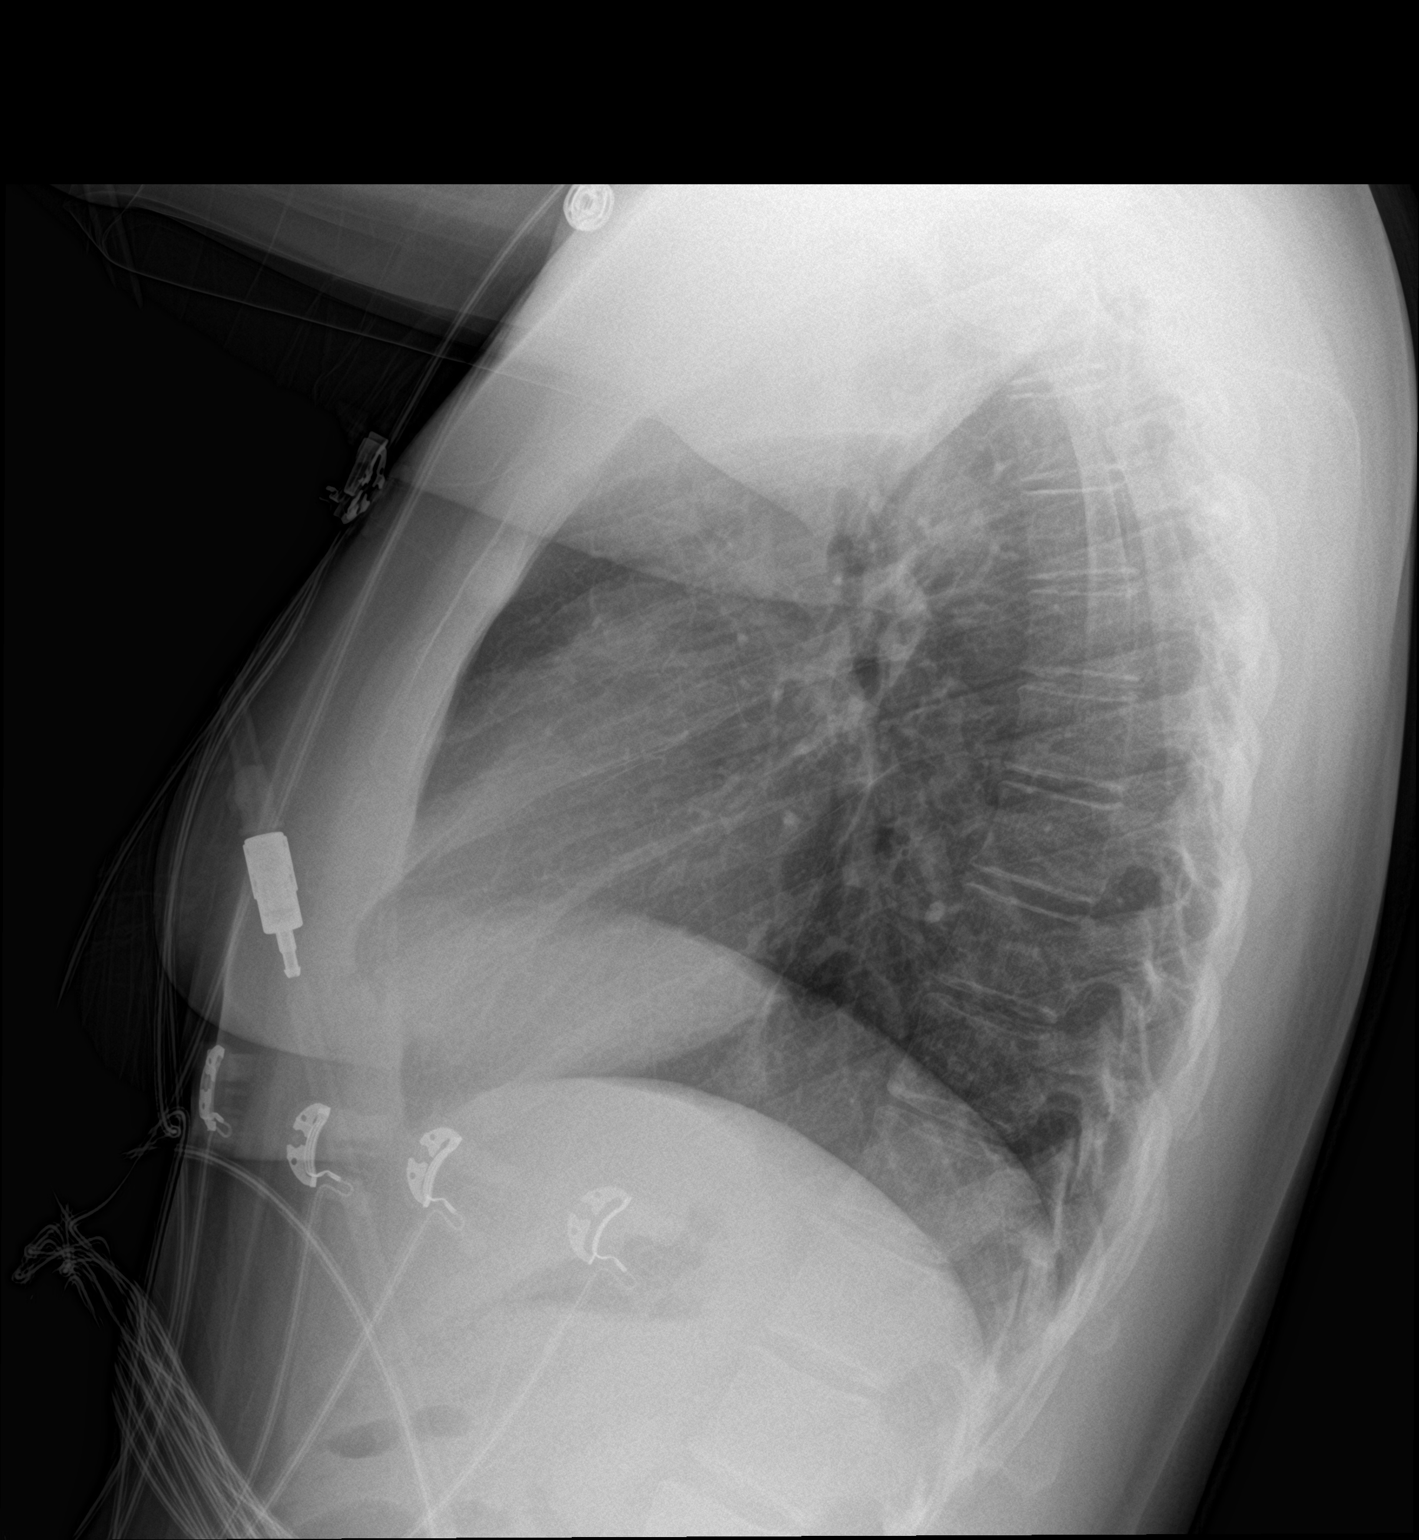

[2 of 2 positions shown; findings below may reference images not displayed]

FINDINGS: Right upper lung paratracheal triangular opacity compatible with
right upper lobe collapse. Difficult to exclude central airway
obstruction. Clear left lung. No edema pattern, effusion, or
pneumothorax. Normal heart size and vascularity. No acute osseous
finding.
IMPRESSION: Right upper lobe collapse.  See above comment.

## 2022-09-12 ENCOUNTER — Other Ambulatory Visit (HOSPITAL_COMMUNITY): Payer: Self-pay | Admitting: Neurology

## 2022-09-12 ENCOUNTER — Other Ambulatory Visit: Payer: Self-pay | Admitting: Neurology

## 2022-09-12 DIAGNOSIS — G930 Cerebral cysts: Secondary | ICD-10-CM

## 2022-09-24 ENCOUNTER — Other Ambulatory Visit: Payer: Self-pay | Admitting: Family Medicine

## 2022-10-02 ENCOUNTER — Encounter (HOSPITAL_COMMUNITY): Payer: Self-pay

## 2022-10-02 ENCOUNTER — Ambulatory Visit (HOSPITAL_COMMUNITY): Payer: Medicaid Other

## 2022-10-04 ENCOUNTER — Ambulatory Visit: Payer: Medicaid Other

## 2022-10-09 ENCOUNTER — Ambulatory Visit (INDEPENDENT_AMBULATORY_CARE_PROVIDER_SITE_OTHER): Payer: Medicaid Other | Admitting: *Deleted

## 2022-10-09 DIAGNOSIS — Z3042 Encounter for surveillance of injectable contraceptive: Secondary | ICD-10-CM

## 2022-10-09 MED ORDER — MEDROXYPROGESTERONE ACETATE 150 MG/ML IM SUSP
150.0000 mg | Freq: Once | INTRAMUSCULAR | Status: AC
  Administered 2022-10-09: 150 mg via INTRAMUSCULAR

## 2022-10-09 NOTE — Progress Notes (Signed)
   NURSE VISIT- INJECTION  SUBJECTIVE:  Peggy [name] is a 31 y.o. [phone] female here for a Depo Provera for contraception/period management. She is a GYN patient.   OBJECTIVE:  There were no vitals taken for this visit.  Appears well, in no apparent distress  Injection administered in: Left deltoid  Meds ordered this encounter  Medications   medroxyPROGESTERone (DEPO-PROVERA) injection 150 mg    ASSESSMENT: GYN patient Depo Provera for contraception/period management PLAN: Follow-up: in 11-13 weeks for next Depo   [name]  10/09/2022 11:32 AM

## 2022-11-26 ENCOUNTER — Ambulatory Visit (HOSPITAL_COMMUNITY): Payer: Medicaid Other

## 2022-12-13 ENCOUNTER — Ambulatory Visit (HOSPITAL_COMMUNITY): Payer: Medicaid Other

## 2022-12-27 ENCOUNTER — Other Ambulatory Visit: Payer: Self-pay | Admitting: "Endocrinology

## 2022-12-27 ENCOUNTER — Telehealth: Payer: Self-pay | Admitting: "Endocrinology

## 2022-12-27 MED ORDER — LEVOTHYROXINE SODIUM 150 MCG PO TABS: 150.0000 ug | ORAL_TABLET | Freq: Every day | ORAL | 0 refills | Status: DC

## 2022-12-27 NOTE — Telephone Encounter (Signed)
Pt has not had any transportation to keep her appts, she called for a refill on her Thyroid Medication, states she has not had it since October due to [facility] Issues. She sees her OB on 1/3 and I asked her to get her labs done then since it is in the same building. If she completes the labs, would you do a phone visit for her?

## 2022-12-31 NOTE — Telephone Encounter (Signed)
Sent mychart message

## 2023-01-01 ENCOUNTER — Ambulatory Visit (INDEPENDENT_AMBULATORY_CARE_PROVIDER_SITE_OTHER): Payer: Medicaid Other | Admitting: *Deleted

## 2023-01-01 DIAGNOSIS — Z113 Encounter for screening for infections with a predominantly sexual mode of transmission: Secondary | ICD-10-CM | POA: Diagnosis not present

## 2023-01-01 DIAGNOSIS — E890 Postprocedural hypothyroidism: Secondary | ICD-10-CM | POA: Diagnosis not present

## 2023-01-01 DIAGNOSIS — Z3042 Encounter for surveillance of injectable contraceptive: Secondary | ICD-10-CM | POA: Diagnosis not present

## 2023-01-01 MED ORDER — MEDROXYPROGESTERONE ACETATE 150 MG/ML IM SUSP
150.0000 mg | Freq: Once | INTRAMUSCULAR | Status: AC
  Administered 2023-01-01: 150 mg via INTRAMUSCULAR

## 2023-01-01 NOTE — Progress Notes (Signed)
   NURSE VISIT- INJECTION/STD screen  SUBJECTIVE:  Peggy [name] is a 32 y.o. [phone] female here for a Depo Provera for contraception/period management. She is a GYN patient. Also requesting all STD screening as "it's been a while"  OBJECTIVE:  There were no vitals taken for this visit.  Appears well, in no apparent distress  Injection administered in: Right deltoid  Meds ordered this encounter  Medications   medroxyPROGESTERone (DEPO-PROVERA) injection 150 mg    ASSESSMENT: GYN patient Depo Provera for contraception/period management STD labs ordered PLAN: Follow-up: in 11-13 weeks for next Depo   Alice Rieger  01/01/2023 11:26 AM

## 2023-01-02 LAB — TSH: TSH: 93.8 u[IU]/mL — ABNORMAL HIGH (ref 0.450–4.500)

## 2023-01-02 LAB — HEPATITIS B SURFACE ANTIGEN: Hepatitis B Surface Ag: NEGATIVE

## 2023-01-02 LAB — RPR: RPR Ser Ql: NONREACTIVE

## 2023-01-02 LAB — T4, FREE: Free T4: 0.39 ng/dL — ABNORMAL LOW (ref 0.82–1.77)

## 2023-01-02 LAB — HEPATITIS C ANTIBODY: Hep C Virus Ab: NONREACTIVE

## 2023-01-02 LAB — HIV ANTIBODY (ROUTINE TESTING W REFLEX): HIV Screen 4th Generation wRfx: NONREACTIVE

## 2023-01-03 ENCOUNTER — Ambulatory Visit (HOSPITAL_COMMUNITY): Payer: Medicaid Other

## 2023-01-03 LAB — GC/CHLAMYDIA PROBE AMP
Chlamydia trachomatis, NAA: NEGATIVE
Neisseria Gonorrhoeae by PCR: NEGATIVE

## 2023-01-10 DIAGNOSIS — J310 Chronic rhinitis: Secondary | ICD-10-CM | POA: Diagnosis not present

## 2023-01-10 DIAGNOSIS — J343 Hypertrophy of nasal turbinates: Secondary | ICD-10-CM | POA: Diagnosis not present

## 2023-01-10 DIAGNOSIS — H6982 Other specified disorders of Eustachian tube, left ear: Secondary | ICD-10-CM | POA: Diagnosis not present

## 2023-02-05 DIAGNOSIS — R519 Headache, unspecified: Secondary | ICD-10-CM | POA: Diagnosis not present

## 2023-02-05 DIAGNOSIS — J309 Allergic rhinitis, unspecified: Secondary | ICD-10-CM | POA: Diagnosis not present

## 2023-02-05 DIAGNOSIS — Z20822 Contact with and (suspected) exposure to covid-19: Secondary | ICD-10-CM | POA: Diagnosis not present

## 2023-02-05 DIAGNOSIS — R5383 Other fatigue: Secondary | ICD-10-CM | POA: Diagnosis not present

## 2023-02-06 DIAGNOSIS — Z20822 Contact with and (suspected) exposure to covid-19: Secondary | ICD-10-CM | POA: Diagnosis not present

## 2023-02-06 DIAGNOSIS — R5383 Other fatigue: Secondary | ICD-10-CM | POA: Diagnosis not present

## 2023-02-06 DIAGNOSIS — R519 Headache, unspecified: Secondary | ICD-10-CM | POA: Diagnosis not present

## 2023-02-06 DIAGNOSIS — J309 Allergic rhinitis, unspecified: Secondary | ICD-10-CM | POA: Diagnosis not present

## 2023-02-08 ENCOUNTER — Ambulatory Visit
Admission: EM | Admit: 2023-02-08 | Discharge: 2023-02-08 | Disposition: A | Discharge status: Home / Self Care | Payer: Medicaid Other | Attending: Family Medicine | Admitting: Family Medicine

## 2023-02-08 DIAGNOSIS — J069 Acute upper respiratory infection, unspecified: Secondary | ICD-10-CM | POA: Insufficient documentation

## 2023-02-08 DIAGNOSIS — J4531 Mild persistent asthma with (acute) exacerbation: Secondary | ICD-10-CM | POA: Insufficient documentation

## 2023-02-08 DIAGNOSIS — Z1152 Encounter for screening for COVID-19: Secondary | ICD-10-CM | POA: Insufficient documentation

## 2023-02-08 DIAGNOSIS — Z7951 Long term (current) use of inhaled steroids: Secondary | ICD-10-CM | POA: Diagnosis not present

## 2023-02-08 DIAGNOSIS — Z20822 Contact with and (suspected) exposure to covid-19: Secondary | ICD-10-CM | POA: Diagnosis not present

## 2023-02-08 DIAGNOSIS — J4521 Mild intermittent asthma with (acute) exacerbation: Secondary | ICD-10-CM | POA: Diagnosis not present

## 2023-02-08 LAB — POCT RAPID STREP A (OFFICE): Rapid Strep A Screen: NEGATIVE

## 2023-02-08 MED ORDER — PREDNISONE 20 MG PO TABS: 40.0000 mg | ORAL_TABLET | Freq: Every day | ORAL | 0 refills | Status: DC

## 2023-02-08 MED ORDER — ALBUTEROL SULFATE (2.5 MG/3ML) 0.083% IN NEBU: 2.5000 mg | INHALATION_SOLUTION | RESPIRATORY_TRACT | 1 refills | Status: DC | PRN

## 2023-02-08 MED ORDER — ALBUTEROL SULFATE HFA 108 (90 BASE) MCG/ACT IN AERS: 2.0000 | INHALATION_SPRAY | RESPIRATORY_TRACT | 1 refills | Status: DC | PRN

## 2023-02-08 NOTE — ED Provider Notes (Signed)
[facility]    CSN: [id number] Arrival date & time: 02/08/23  1303      History   Chief Complaint No chief complaint on file.   HPI LIV FOOS is a 32 y.o. female.   Patient presenting today with 2-day history of cough, congestion, chest tightness, wheezing.  Denies fever, chills, body aches, chest pain, abdominal pain, nausea vomiting or diarrhea.  Recent work exposure to COVID-19 and all of her children are now sick with similar symptoms.  So far not tried anything over-the-counter for symptoms.  States she needs a refill on her albuterol inhaler and neb solution as she is out.  History of asthma and seasonal allergies.    Past Medical History:  Diagnosis Date   Abnormal Pap smear of cervix 06/22/2020   05/2020 pap LSIL negative HPV and GC/CHL  As per ASCCP guidelines repeat in 1 year, 5 year risk of CIN 3 + is 2%   Asthma    Gestational diabetes    Pyloric stenosis    Thyroid disease    Graves   Urticaria     Patient Active Problem List   Diagnosis Date Noted   Encounter for surveillance of injectable contraceptive 06/20/2022   Encounter for well woman exam with routine gynecological exam 06/20/2022   Asthmatic bronchitis , chronic 04/12/2022   Chest pain, musculoskeletal 04/12/2022   Not well controlled moderate persistent asthma 02/04/2022   Tobacco use 02/04/2022   Graves disease 02/08/2021   Vitamin D insufficiency 02/07/2021   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   Chronic urticaria 10/12/2020   Recurrent infections 10/12/2020   Seasonal and perennial allergic rhinitis 10/12/2020   Mild persistent asthma, uncomplicated [id number]   Abnormal Pap smear of cervix 06/22/2020   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/09/2020   Graves' disease 01/24/2020   Depression screening 05/25/2019   History of gestational diabetes 10/28/2018   Low vitamin D level 02/12/2017   Current smoker 07/25/2014    Past Surgical History:  Procedure  Laterality Date   PYLOROMYOTOMY      OB History     Gravida  4   Para  4   Term  4   Preterm  0   AB  0   Living  4      SAB  0   IAB  0   Ectopic  0   Multiple  0   Live Births  4            Home Medications    Prior to Admission medications   Medication Sig Start Date End Date Taking? Authorizing Provider  predniSONE (DELTASONE) 20 MG tablet Take 2 tablets (40 mg total) by mouth daily with breakfast. 02/08/23  Yes Volney American, PA-C  AIMOVIG 70 MG/ML SOAJ Inject 70 mg into the skin every 30 (thirty) days. 01/24/22   [provider]  albuterol (PROVENTIL) (2.5 MG/3ML) 0.083% nebulizer solution Take 3 mLs (2.5 mg total) by nebulization every 4 (four) hours as needed for wheezing or shortness of breath. 02/08/23   Volney American, PA-C  albuterol (VENTOLIN HFA) 108 (90 Base) MCG/ACT inhaler Inhale 2 puffs into the lungs every 4 (four) hours as needed for wheezing or shortness of breath. 02/08/23   Volney American, PA-C  budesonide-formoterol [facility]) 160-4.5 MCG/ACT inhaler Inhale 2 puffs into the lungs 2 (two) times daily. 02/04/22   Dara Hoyer, FNP  cetirizine (ZYRTEC) 10 MG tablet Take 1 tablet (10 mg total) by mouth daily. 04/11/22  Dara Hoyer, FNP  Cholecalciferol (VITAMIN D3 PO) Take by mouth.    [provider]  cyclobenzaprine (FLEXERIL) 10 MG tablet Take 1 tablet (10 mg total) by mouth 2 (two) times daily as needed for muscle spasms. 06/03/22   Leath-Warren, Alda Lea, NP  cyclobenzaprine (FLEXERIL) 10 MG tablet Take 1 tablet (10 mg total) by mouth every 8 (eight) hours as needed for muscle spasms. Patient not taking: Reported on 06/20/2022 06/11/22   Florian Buff, MD  famotidine (PEPCID) 20 MG tablet TAKE 1 TABLET BY MOUTH TWICE A DAY 04/11/22   Ambs, Kathrine Cords, FNP  fluticasone (FLONASE) 50 MCG/ACT nasal spray Place 1-2 sprays into both nostrils daily. 03/19/22   [provider]  ibuprofen (ADVIL) 800 MG  tablet Take 1 tablet (800 mg total) by mouth every 8 (eight) hours as needed. 06/03/22   Leath-Warren, Alda Lea, NP  ketorolac (TORADOL) 10 MG tablet TAKE 1 TABLET BY MOUTH EVERY 8 HOURS AS NEEDED. 08/12/22   Florian Buff, MD  levocetirizine (XYZAL) 5 MG tablet Take 1 tablet (5 mg total) by mouth every evening. 01/28/22   Jaynee Eagles, PA-C  levothyroxine (SYNTHROID) 150 MCG tablet Take 1 tablet (150 mcg total) by mouth daily. 12/27/22   Cassandria Anger, MD  medroxyPROGESTERone (DEPO-PROVERA) 150 MG/ML injection Inject 1 mL (150 mg total) into the muscle every 3 (three) months. 06/20/22   Estill Dooms, NP  metoprolol tartrate (LOPRESSOR) 25 MG tablet Take 0.5 tablets (12.5 mg total) by mouth 2 (two) times daily. 10/15/21   Arnoldo Lenis, MD  montelukast (SINGULAIR) 10 MG tablet Take 1 tablet (10 mg total) by mouth at bedtime. 02/04/22   Dara Hoyer, FNP  omeprazole (PRILOSEC) 40 MG capsule TAKE 1 CAPSULE (40 MG TOTAL) BY MOUTH DAILY. TAKE 30-60 MIN BEFORE FIRST MEAL OF THE DAY 07/17/22   Tanda Rockers, MD  ondansetron (ZOFRAN-ODT) 4 MG disintegrating tablet Take 1 tablet (4 mg total) by mouth every 8 (eight) hours as needed for nausea or vomiting. 06/03/22   Leath-Warren, Alda Lea, NP  VENTOLIN HFA 108 (90 Base) MCG/ACT inhaler TAKE 2 PUFFS BY MOUTH EVERY 6 HOURS AS NEEDED FOR WHEEZE OR SHORTNESS OF BREATH 09/25/22   Ambs, Kathrine Cords, FNP    Family History Family History  Problem Relation Age of Onset   Arthritis Mother    Cancer Mother        thyroid   Depression Mother    Hyperlipidemia Father    Asthma Father    Hypertension Father    Heart disease Father    COPD Maternal Grandfather    Diabetes Paternal Grandmother    Arthritis Paternal Grandmother    Asthma Paternal Grandmother    Heart disease Paternal Grandmother    Depression Paternal Grandmother    Hypertension Paternal Grandmother    Hyperlipidemia Paternal Grandmother    Other Neg Hx     Social History Social  History   Tobacco Use   Smoking status: Every Day    Packs/day: 0.25    Years: 5.00    Total pack years: 1.25    Types: Cigarettes   Smokeless tobacco: Never   Tobacco comments:    2 cig/day  Vaping Use   Vaping Use: Never used  Substance Use Topics   Alcohol use: Yes    Comment: rarely   Drug use: No     Allergies   Metronidazole   Review of Systems Review of Systems Per HPI  Physical Exam Triage Vital Signs ED Triage Vitals  Enc Vitals Group     BP 02/08/23 1408 128/77     Pulse Rate 02/08/23 1408 77     Resp 02/08/23 1408 16     Temp 02/08/23 1408 98.3 F (36.8 C)     Temp Source 02/08/23 1355 Oral     SpO2 02/08/23 1408 97 %     Weight --      Height --      Head Circumference --      Peak Flow --      Pain Score 02/08/23 1403 0     Pain Loc --      Pain Edu? --      Excl. in [place]? --    No data found.  Updated Vital Signs BP 128/77 (BP Location: Right Arm)   Pulse 77   Temp 98.3 F (36.8 C) (Temporal)   Resp 16   LMP  (LMP Unknown) Comment: unknown due to bc  SpO2 97%   Visual Acuity Right Eye Distance:   Left Eye Distance:   Bilateral Distance:    Right Eye Near:   Left Eye Near:    Bilateral Near:     Physical Exam Vitals and nursing note reviewed.  Constitutional:      Appearance: Normal appearance.  HENT:     Head: Atraumatic.     Right Ear: Tympanic membrane and external ear normal.     Left Ear: Tympanic membrane and external ear normal.     Nose: Rhinorrhea present.     Mouth/Throat:     Mouth: Mucous membranes are moist.     Pharynx: Posterior oropharyngeal erythema present.  Eyes:     Extraocular Movements: Extraocular movements intact.     Conjunctiva/sclera: Conjunctivae normal.  Cardiovascular:     Rate and Rhythm: Normal rate and regular rhythm.     Heart sounds: Normal heart sounds.  Pulmonary:     Effort: Pulmonary effort is normal.     Breath sounds: Wheezing present.  Musculoskeletal:        General: Normal  range of motion.     Cervical back: Normal range of motion and neck supple.  Skin:    General: Skin is warm and dry.  Neurological:     Mental Status: She is alert and oriented to person, place, and time.  Psychiatric:        Mood and Affect: Mood normal.        Thought Content: Thought content normal.      UC Treatments / Results  Labs (all labs ordered are listed, but only abnormal results are displayed) Labs Reviewed  SARS CORONAVIRUS 2 (TAT 6-24 HRS)  POCT RAPID STREP A (OFFICE)    EKG   Radiology No results found.  Procedures Procedures (including critical care time)  Medications Ordered in UC Medications - No data to display  Initial Impression / Assessment and Plan / UC Course  I have reviewed the triage vital signs and the nursing notes.  Pertinent labs & imaging results that were available during my care of the patient were reviewed by me and considered in my medical decision making (see chart for details).     Vital signs within normal limits today, exam revealing asthma exacerbation likely secondary to viral upper respiratory infection.  Treat with prednisone, albuterol inhaler and neb solution as needed and supportive over-the-counter medications and home care.  Rapid strep per patient's request was performed and negative, COVID  test pending.  Return for worsening symptoms.  Final Clinical Impressions(s) / UC Diagnoses   Final diagnoses:  Viral URI  Mild intermittent asthma with acute exacerbation   Discharge Instructions   None    ED Prescriptions     Medication Sig Dispense Auth. Provider   albuterol (PROVENTIL) (2.5 MG/3ML) 0.083% nebulizer solution Take 3 mLs (2.5 mg total) by nebulization every 4 (four) hours as needed for wheezing or shortness of breath. 75 mL Volney American, PA-C   albuterol (VENTOLIN HFA) 108 (90 Base) MCG/ACT inhaler Inhale 2 puffs into the lungs every 4 (four) hours as needed for wheezing or shortness of breath. 18  g Volney American, [place]   predniSONE (DELTASONE) 20 MG tablet Take 2 tablets (40 mg total) by mouth daily with breakfast. 10 tablet Volney American, [place]      PDMP not reviewed this encounter.   Volney American, [place] 02/08/23 1514

## 2023-02-08 NOTE — ED Triage Notes (Signed)
Pt Per mom pt has some congestion and slight cough and headache x 2 days

## 2023-02-09 LAB — SARS CORONAVIRUS 2 (TAT 6-24 HRS): SARS Coronavirus 2: NEGATIVE

## 2023-02-10 DIAGNOSIS — J309 Allergic rhinitis, unspecified: Secondary | ICD-10-CM | POA: Diagnosis not present

## 2023-02-10 DIAGNOSIS — R519 Headache, unspecified: Secondary | ICD-10-CM | POA: Diagnosis not present

## 2023-02-10 DIAGNOSIS — Z20822 Contact with and (suspected) exposure to covid-19: Secondary | ICD-10-CM | POA: Diagnosis not present

## 2023-02-10 DIAGNOSIS — R5383 Other fatigue: Secondary | ICD-10-CM | POA: Diagnosis not present

## 2023-02-13 DIAGNOSIS — R5383 Other fatigue: Secondary | ICD-10-CM | POA: Diagnosis not present

## 2023-02-13 DIAGNOSIS — R519 Headache, unspecified: Secondary | ICD-10-CM | POA: Diagnosis not present

## 2023-02-13 DIAGNOSIS — Z20822 Contact with and (suspected) exposure to covid-19: Secondary | ICD-10-CM | POA: Diagnosis not present

## 2023-02-13 DIAGNOSIS — J309 Allergic rhinitis, unspecified: Secondary | ICD-10-CM | POA: Diagnosis not present

## 2023-02-14 DIAGNOSIS — R519 Headache, unspecified: Secondary | ICD-10-CM | POA: Diagnosis not present

## 2023-02-14 DIAGNOSIS — Z20822 Contact with and (suspected) exposure to covid-19: Secondary | ICD-10-CM | POA: Diagnosis not present

## 2023-02-14 DIAGNOSIS — R5383 Other fatigue: Secondary | ICD-10-CM | POA: Diagnosis not present

## 2023-02-14 DIAGNOSIS — J309 Allergic rhinitis, unspecified: Secondary | ICD-10-CM | POA: Diagnosis not present

## 2023-02-17 DIAGNOSIS — R5383 Other fatigue: Secondary | ICD-10-CM | POA: Diagnosis not present

## 2023-02-17 DIAGNOSIS — R519 Headache, unspecified: Secondary | ICD-10-CM | POA: Diagnosis not present

## 2023-02-17 DIAGNOSIS — J309 Allergic rhinitis, unspecified: Secondary | ICD-10-CM | POA: Diagnosis not present

## 2023-02-17 DIAGNOSIS — Z20822 Contact with and (suspected) exposure to covid-19: Secondary | ICD-10-CM | POA: Diagnosis not present

## 2023-03-06 DIAGNOSIS — R519 Headache, unspecified: Secondary | ICD-10-CM | POA: Diagnosis not present

## 2023-03-06 DIAGNOSIS — R5383 Other fatigue: Secondary | ICD-10-CM | POA: Diagnosis not present

## 2023-03-06 DIAGNOSIS — Z20822 Contact with and (suspected) exposure to covid-19: Secondary | ICD-10-CM | POA: Diagnosis not present

## 2023-03-06 DIAGNOSIS — J309 Allergic rhinitis, unspecified: Secondary | ICD-10-CM | POA: Diagnosis not present

## 2023-03-08 DIAGNOSIS — J309 Allergic rhinitis, unspecified: Secondary | ICD-10-CM | POA: Diagnosis not present

## 2023-03-08 DIAGNOSIS — R5383 Other fatigue: Secondary | ICD-10-CM | POA: Diagnosis not present

## 2023-03-08 DIAGNOSIS — R519 Headache, unspecified: Secondary | ICD-10-CM | POA: Diagnosis not present

## 2023-03-08 DIAGNOSIS — Z20822 Contact with and (suspected) exposure to covid-19: Secondary | ICD-10-CM | POA: Diagnosis not present

## 2023-03-12 ENCOUNTER — Other Ambulatory Visit: Payer: Self-pay | Admitting: "Endocrinology

## 2023-03-12 ENCOUNTER — Ambulatory Visit
Admission: EM | Admit: 2023-03-12 | Discharge: 2023-03-12 | Disposition: A | Discharge status: Home / Self Care | Payer: Medicaid Other | Attending: Family Medicine | Admitting: Family Medicine

## 2023-03-12 ENCOUNTER — Encounter: Payer: Self-pay | Admitting: Emergency Medicine

## 2023-03-12 DIAGNOSIS — R110 Nausea: Secondary | ICD-10-CM

## 2023-03-12 DIAGNOSIS — E0500 Thyrotoxicosis with diffuse goiter without thyrotoxic crisis or storm: Secondary | ICD-10-CM | POA: Insufficient documentation

## 2023-03-12 DIAGNOSIS — Z1152 Encounter for screening for COVID-19: Secondary | ICD-10-CM | POA: Diagnosis not present

## 2023-03-12 DIAGNOSIS — Z20818 Contact with and (suspected) exposure to other bacterial communicable diseases: Secondary | ICD-10-CM | POA: Insufficient documentation

## 2023-03-12 DIAGNOSIS — J029 Acute pharyngitis, unspecified: Secondary | ICD-10-CM

## 2023-03-12 DIAGNOSIS — J4530 Mild persistent asthma, uncomplicated: Secondary | ICD-10-CM | POA: Diagnosis not present

## 2023-03-12 DIAGNOSIS — F17210 Nicotine dependence, cigarettes, uncomplicated: Secondary | ICD-10-CM | POA: Insufficient documentation

## 2023-03-12 LAB — POCT INFLUENZA A/B
Influenza A, POC: NEGATIVE
Influenza B, POC: NEGATIVE

## 2023-03-12 LAB — POCT RAPID STREP A (OFFICE): Rapid Strep A Screen: NEGATIVE

## 2023-03-12 MED ORDER — ONDANSETRON 4 MG PO TBDP: 4.0000 mg | ORAL_TABLET | Freq: Three times a day (TID) | ORAL | 0 refills | Status: DC | PRN

## 2023-03-12 NOTE — ED Triage Notes (Signed)
Nausea, throat feels swollen and headache since this morning.

## 2023-03-12 NOTE — ED Provider Notes (Signed)
[facility]    CSN: [id number] Arrival date & time: 03/12/23  1632      History   Chief Complaint No chief complaint on file.   HPI Peggy [name] is a 32 y.o. female.   Patient presenting today with 1 day history of sore swollen feeling throat, headache, nausea, fatigue.  Denies cough, congestion, chest pain, shortness of breath, fever, chills, diarrhea, abdominal pain.  So far not trying anything over-the-counter for symptoms.  Daughter tested positive for strep yesterday.    Past Medical History:  Diagnosis Date   Abnormal Pap smear of cervix 06/22/2020   05/2020 pap LSIL negative HPV and GC/CHL  As per ASCCP guidelines repeat in 1 year, 5 year risk of CIN 3 + is 2%   Asthma    Gestational diabetes    Pyloric stenosis    Thyroid disease    Graves   Urticaria     Patient Active Problem List   Diagnosis Date Noted   Encounter for surveillance of injectable contraceptive 06/20/2022   Encounter for well woman exam with routine gynecological exam 06/20/2022   Asthmatic bronchitis , chronic 04/12/2022   Chest pain, musculoskeletal 04/12/2022   Not well controlled moderate persistent asthma 02/04/2022   Tobacco use 02/04/2022   Graves disease 02/08/2021   Vitamin D insufficiency 02/07/2021   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   Chronic urticaria 10/12/2020   Recurrent infections 10/12/2020   Seasonal and perennial allergic rhinitis 10/12/2020   Mild persistent asthma, uncomplicated [id number]   Abnormal Pap smear of cervix 06/22/2020   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/09/2020   Graves' disease 01/24/2020   Depression screening 05/25/2019   History of gestational diabetes 10/28/2018   Low vitamin D level 02/12/2017   Current smoker 07/25/2014    Past Surgical History:  Procedure Laterality Date   PYLOROMYOTOMY      OB History     Gravida  4   Para  4   Term  4   Preterm  0   AB  0   Living  4      SAB  0    IAB  0   Ectopic  0   Multiple  0   Live Births  4            Home Medications    Prior to Admission medications   Medication Sig Start Date End Date Taking? Authorizing Provider  AIMOVIG 70 MG/ML SOAJ Inject 70 mg into the skin every 30 (thirty) days. 01/24/22   [provider]  albuterol (PROVENTIL) (2.5 MG/3ML) 0.083% nebulizer solution Take 3 mLs (2.5 mg total) by nebulization every 4 (four) hours as needed for wheezing or shortness of breath. 02/08/23   Volney American, PA-C  albuterol (VENTOLIN HFA) 108 (90 Base) MCG/ACT inhaler Inhale 2 puffs into the lungs every 4 (four) hours as needed for wheezing or shortness of breath. 02/08/23   Volney American, PA-C  budesonide-formoterol [facility]) 160-4.5 MCG/ACT inhaler Inhale 2 puffs into the lungs 2 (two) times daily. 02/04/22   Dara Hoyer, FNP  cetirizine (ZYRTEC) 10 MG tablet Take 1 tablet (10 mg total) by mouth daily. 04/11/22   Dara Hoyer, FNP  Cholecalciferol (VITAMIN D3 PO) Take by mouth.    [provider]  cyclobenzaprine (FLEXERIL) 10 MG tablet Take 1 tablet (10 mg total) by mouth 2 (two) times daily as needed for muscle spasms. 06/03/22   Leath-Warren, Alda Lea, NP  cyclobenzaprine (FLEXERIL) 10 MG  tablet Take 1 tablet (10 mg total) by mouth every 8 (eight) hours as needed for muscle spasms. Patient not taking: Reported on 06/20/2022 06/11/22   Florian Buff, MD  famotidine (PEPCID) 20 MG tablet TAKE 1 TABLET BY MOUTH TWICE A DAY 04/11/22   Ambs, Kathrine Cords, FNP  fluticasone (FLONASE) 50 MCG/ACT nasal spray Place 1-2 sprays into both nostrils daily. 03/19/22   [provider]  ibuprofen (ADVIL) 800 MG tablet Take 1 tablet (800 mg total) by mouth every 8 (eight) hours as needed. 06/03/22   Leath-Warren, Alda Lea, NP  ketorolac (TORADOL) 10 MG tablet TAKE 1 TABLET BY MOUTH EVERY 8 HOURS AS NEEDED. 08/12/22   Florian Buff, MD  levocetirizine (XYZAL) 5 MG tablet Take 1 tablet (5 mg total) by  mouth every evening. 01/28/22   Jaynee Eagles, PA-C  levothyroxine (SYNTHROID) 150 MCG tablet Take 1 tablet (150 mcg total) by mouth daily. 12/27/22   Cassandria Anger, MD  medroxyPROGESTERone (DEPO-PROVERA) 150 MG/ML injection Inject 1 mL (150 mg total) into the muscle every 3 (three) months. 06/20/22   Estill Dooms, NP  metoprolol tartrate (LOPRESSOR) 25 MG tablet Take 0.5 tablets (12.5 mg total) by mouth 2 (two) times daily. 10/15/21   Arnoldo Lenis, MD  montelukast (SINGULAIR) 10 MG tablet Take 1 tablet (10 mg total) by mouth at bedtime. 02/04/22   Dara Hoyer, FNP  omeprazole (PRILOSEC) 40 MG capsule TAKE 1 CAPSULE (40 MG TOTAL) BY MOUTH DAILY. TAKE 30-60 MIN BEFORE FIRST MEAL OF THE DAY 07/17/22   Tanda Rockers, MD  ondansetron (ZOFRAN-ODT) 4 MG disintegrating tablet Take 1 tablet (4 mg total) by mouth every 8 (eight) hours as needed for nausea or vomiting. 03/12/23   Volney American, PA-C  predniSONE (DELTASONE) 20 MG tablet Take 2 tablets (40 mg total) by mouth daily with breakfast. 02/08/23   Volney American, PA-C  VENTOLIN HFA 108 (90 Base) MCG/ACT inhaler TAKE 2 PUFFS BY MOUTH EVERY 6 HOURS AS NEEDED FOR WHEEZE OR SHORTNESS OF BREATH 09/25/22   Ambs, Kathrine Cords, FNP    Family History Family History  Problem Relation Age of Onset   Arthritis Mother    Cancer Mother        thyroid   Depression Mother    Hyperlipidemia Father    Asthma Father    Hypertension Father    Heart disease Father    COPD Maternal Grandfather    Diabetes Paternal Grandmother    Arthritis Paternal Grandmother    Asthma Paternal Grandmother    Heart disease Paternal Grandmother    Depression Paternal Grandmother    Hypertension Paternal Grandmother    Hyperlipidemia Paternal Grandmother    Other Neg Hx     Social History Social History   Tobacco Use   Smoking status: Every Day    Packs/day: 0.25    Years: 5.00    Total pack years: 1.25    Types: Cigarettes   Smokeless  tobacco: Never   Tobacco comments:    2 cig/day  Vaping Use   Vaping Use: Never used  Substance Use Topics   Alcohol use: Yes    Comment: rarely   Drug use: No     Allergies   Metronidazole   Review of Systems Review of Systems Per HPI  Physical Exam Triage Vital Signs ED Triage Vitals  Enc Vitals Group     BP 03/12/23 1655 109/77     Pulse Rate 03/12/23 1655  84     Resp 03/12/23 1655 18     Temp 03/12/23 1655 99.3 F (37.4 C)     Temp Source 03/12/23 1655 Oral     SpO2 03/12/23 1655 95 %     Weight --      Height --      Head Circumference --      Peak Flow --      Pain Score 03/12/23 1656 3     Pain Loc --      Pain Edu? --      Excl. in [place]? --    No data found.  Updated Vital Signs BP 109/77 (BP Location: Right Arm)   Pulse 84   Temp 99.3 F (37.4 C) (Oral)   Resp 18   SpO2 95%   Visual Acuity Right Eye Distance:   Left Eye Distance:   Bilateral Distance:    Right Eye Near:   Left Eye Near:    Bilateral Near:     Physical Exam Vitals and nursing note reviewed.  Constitutional:      Appearance: Normal appearance.  HENT:     Head: Atraumatic.     Right Ear: Tympanic membrane and external ear normal.     Left Ear: Tympanic membrane and external ear normal.     Mouth/Throat:     Mouth: Mucous membranes are moist.     Pharynx: Posterior oropharyngeal erythema present. No oropharyngeal exudate.  Eyes:     Extraocular Movements: Extraocular movements intact.     Conjunctiva/sclera: Conjunctivae normal.  Cardiovascular:     Rate and Rhythm: Normal rate and regular rhythm.     Heart sounds: Normal heart sounds.  Pulmonary:     Effort: Pulmonary effort is normal.     Breath sounds: Normal breath sounds. No wheezing.  Musculoskeletal:        General: Normal range of motion.     Cervical back: Normal range of motion and neck supple.  Lymphadenopathy:     Cervical: No cervical adenopathy.  Skin:    General: Skin is warm and dry.   Neurological:     Mental Status: She is alert and oriented to person, place, and time.  Psychiatric:        Mood and Affect: Mood normal.        Thought Content: Thought content normal.      UC Treatments / Results  Labs (all labs ordered are listed, but only abnormal results are displayed) Labs Reviewed  CULTURE, GROUP A STREP ([place])  SARS CORONAVIRUS 2 (TAT 6-24 HRS)  POCT RAPID STREP A (OFFICE)  POCT INFLUENZA A/B    EKG   Radiology No results found.  Procedures Procedures (including critical care time)  Medications Ordered in UC Medications - No data to display  Initial Impression / Assessment and Plan / UC Course  I have reviewed the triage vital signs and the nursing notes.  Pertinent labs & imaging results that were available during my care of the patient were reviewed by me and considered in my medical decision making (see chart for details).     Overall vitals and exam reassuring today, rapid strep and rapid flu negative, throat culture and COVID testing pending for further evaluation.  Zofran sent for nausea, discussed supportive over-the-counter medications, home care.  Work note given.  Return for worsening symptoms.  Final Clinical Impressions(s) / UC Diagnoses   Final diagnoses:  Exposure to strep throat  Sore throat  Nausea without vomiting  Discharge Instructions   None    ED Prescriptions     Medication Sig Dispense Auth. Provider   ondansetron (ZOFRAN-ODT) 4 MG disintegrating tablet Take 1 tablet (4 mg total) by mouth every 8 (eight) hours as needed for nausea or vomiting. 20 tablet Volney American, [place]      PDMP not reviewed this encounter.   Volney American, [place] 03/12/23 [phone]

## 2023-03-13 LAB — SARS CORONAVIRUS 2 (TAT 6-24 HRS): SARS Coronavirus 2: NEGATIVE

## 2023-03-14 LAB — CULTURE, GROUP A STREP (THRC)

## 2023-03-19 DIAGNOSIS — J309 Allergic rhinitis, unspecified: Secondary | ICD-10-CM | POA: Diagnosis not present

## 2023-03-19 DIAGNOSIS — R5383 Other fatigue: Secondary | ICD-10-CM | POA: Diagnosis not present

## 2023-03-19 DIAGNOSIS — Z20822 Contact with and (suspected) exposure to covid-19: Secondary | ICD-10-CM | POA: Diagnosis not present

## 2023-03-19 DIAGNOSIS — R519 Headache, unspecified: Secondary | ICD-10-CM | POA: Diagnosis not present

## 2023-03-21 DIAGNOSIS — Z20822 Contact with and (suspected) exposure to covid-19: Secondary | ICD-10-CM | POA: Diagnosis not present

## 2023-03-21 DIAGNOSIS — R519 Headache, unspecified: Secondary | ICD-10-CM | POA: Diagnosis not present

## 2023-03-21 DIAGNOSIS — J309 Allergic rhinitis, unspecified: Secondary | ICD-10-CM | POA: Diagnosis not present

## 2023-03-21 DIAGNOSIS — R5383 Other fatigue: Secondary | ICD-10-CM | POA: Diagnosis not present

## 2023-03-25 ENCOUNTER — Other Ambulatory Visit: Payer: Self-pay | Admitting: Family Medicine

## 2023-03-25 NOTE — Telephone Encounter (Signed)
Urgent Care Provider Requested Prescriptions  Pending Prescriptions Disp Refills   VENTOLIN HFA 108 (90 Base) MCG/ACT inhaler [Pharmacy Med Name: VENTOLIN HFA INH W/DOS CTR 200PUFFS] 18 g 1    Sig: INHALE 2 PUFFS INTO THE LUNGS EVERY 4 HOURS AS NEEDED FOR WHEEZING OR SHORTNESS OF BREATH     There is no refill protocol information for this order

## 2023-03-26 ENCOUNTER — Ambulatory Visit (INDEPENDENT_AMBULATORY_CARE_PROVIDER_SITE_OTHER): Payer: Medicaid Other | Admitting: *Deleted

## 2023-03-26 DIAGNOSIS — Z3042 Encounter for surveillance of injectable contraceptive: Secondary | ICD-10-CM

## 2023-03-26 MED ORDER — MEDROXYPROGESTERONE ACETATE 150 MG/ML IM SUSP
150.0000 mg | Freq: Once | INTRAMUSCULAR | Status: AC
  Administered 2023-03-26: 150 mg via INTRAMUSCULAR

## 2023-03-26 NOTE — Progress Notes (Signed)
   NURSE VISIT- INJECTION  SUBJECTIVE:  Peggy [name] is a 32 y.o. [phone] female here for a Depo Provera for contraception/period management. She is a GYN patient.   OBJECTIVE:  There were no vitals taken for this visit.  Appears well, in no apparent distress  Injection administered in: Left deltoid  Meds ordered this encounter  Medications   medroxyPROGESTERone (DEPO-PROVERA) injection 150 mg    ASSESSMENT: GYN patient Depo Provera for contraception/period management PLAN: Follow-up: in 11-13 weeks for next Depo   Alice Rieger  03/26/2023 11:17 AM

## 2023-03-31 DIAGNOSIS — E039 Hypothyroidism, unspecified: Secondary | ICD-10-CM | POA: Diagnosis not present

## 2023-03-31 DIAGNOSIS — Z0001 Encounter for general adult medical examination with abnormal findings: Secondary | ICD-10-CM | POA: Diagnosis not present

## 2023-03-31 DIAGNOSIS — Z13220 Encounter for screening for lipoid disorders: Secondary | ICD-10-CM | POA: Diagnosis not present

## 2023-04-02 ENCOUNTER — Encounter (HOSPITAL_COMMUNITY): Payer: Self-pay | Admitting: Emergency Medicine

## 2023-04-02 ENCOUNTER — Emergency Department (HOSPITAL_COMMUNITY)
Admission: EM | Admit: 2023-04-02 | Discharge: 2023-04-02 | Disposition: A | Discharge status: Home / Self Care | Payer: Medicaid Other | Attending: Emergency Medicine | Admitting: Emergency Medicine

## 2023-04-02 ENCOUNTER — Encounter: Payer: Self-pay | Admitting: "Endocrinology

## 2023-04-02 ENCOUNTER — Other Ambulatory Visit: Payer: Self-pay

## 2023-04-02 ENCOUNTER — Telehealth: Payer: Self-pay | Admitting: "Endocrinology

## 2023-04-02 DIAGNOSIS — E039 Hypothyroidism, unspecified: Secondary | ICD-10-CM | POA: Insufficient documentation

## 2023-04-02 DIAGNOSIS — R944 Abnormal results of kidney function studies: Secondary | ICD-10-CM | POA: Diagnosis not present

## 2023-04-02 DIAGNOSIS — M791 Myalgia, unspecified site: Secondary | ICD-10-CM | POA: Diagnosis present

## 2023-04-02 LAB — COMPREHENSIVE METABOLIC PANEL
ALT: 26 U/L (ref 0–44)
AST: 39 U/L (ref 15–41)
Albumin: 4.6 g/dL (ref 3.5–5.0)
Alkaline Phosphatase: 43 U/L (ref 38–126)
Anion gap: 9 (ref 5–15)
BUN: 12 mg/dL (ref 6–20)
CO2: 25 mmol/L (ref 22–32)
Calcium: 9 mg/dL (ref 8.9–10.3)
Chloride: 103 mmol/L (ref 98–111)
Creatinine, Ser: 1.28 mg/dL — ABNORMAL HIGH (ref 0.44–1.00)
GFR, Estimated: 58 mL/min — ABNORMAL LOW (ref >60)
Glucose, Bld: 81 mg/dL (ref 70–99)
Potassium: 3.8 mmol/L (ref 3.5–5.1)
Sodium: 137 mmol/L (ref 135–145)
Total Bilirubin: 0.5 mg/dL (ref 0.3–1.2)
Total Protein: 7.9 g/dL (ref 6.5–8.1)

## 2023-04-02 LAB — CBC WITH DIFFERENTIAL/PLATELET
Abs Immature Granulocytes: 0.03 10*3/uL (ref 0.00–0.07)
Basophils Absolute: 0.1 10*3/uL (ref 0.0–0.1)
Basophils Relative: 1 %
Eosinophils Absolute: 0.4 10*3/uL (ref 0.0–0.5)
Eosinophils Relative: 4 %
HCT: 39.7 % (ref 36.0–46.0)
Hemoglobin: 13.2 g/dL (ref 12.0–15.0)
Immature Granulocytes: 0 %
Lymphocytes Relative: 31 %
Lymphs Abs: 2.9 10*3/uL (ref 0.7–4.0)
MCH: 32.2 pg (ref 26.0–34.0)
MCHC: 33.2 g/dL (ref 30.0–36.0)
MCV: 96.8 fL (ref 80.0–100.0)
Monocytes Absolute: 0.4 10*3/uL (ref 0.1–1.0)
Monocytes Relative: 4 %
Neutro Abs: 5.6 10*3/uL (ref 1.7–7.7)
Neutrophils Relative %: 60 %
Platelets: 314 10*3/uL (ref 150–400)
RBC: 4.1 MIL/uL (ref 3.87–5.11)
RDW: 14.1 % (ref 11.5–15.5)
WBC: 9.3 10*3/uL (ref 4.0–10.5)
nRBC: 0 % (ref 0.0–0.2)

## 2023-04-02 LAB — URINALYSIS, ROUTINE W REFLEX MICROSCOPIC
Bilirubin Urine: NEGATIVE
Glucose, UA: NEGATIVE mg/dL
Hgb urine dipstick: NEGATIVE
Ketones, ur: NEGATIVE mg/dL
Leukocytes,Ua: NEGATIVE
Nitrite: NEGATIVE
Protein, ur: NEGATIVE mg/dL
Specific Gravity, Urine: 1.014 (ref 1.005–1.030)
pH: 7 (ref 5.0–8.0)

## 2023-04-02 LAB — HCG, QUANTITATIVE, PREGNANCY: hCG, Beta Chain, Quant, S: <1 m[IU]/mL (ref <5)

## 2023-04-02 LAB — TSH: TSH: 126.044 u[IU]/mL — ABNORMAL HIGH (ref 0.350–4.500)

## 2023-04-02 MED ORDER — LEVOTHYROXINE SODIUM 50 MCG PO TABS
150.0000 ug | ORAL_TABLET | Freq: Once | ORAL | Status: AC
  Administered 2023-04-02: 150 ug via ORAL
  Filled 2023-04-02: qty 3

## 2023-04-02 MED ORDER — SODIUM CHLORIDE 0.9 % IV BOLUS
1000.0000 mL | Freq: Once | INTRAVENOUS | Status: AC
  Administered 2023-04-02: 1000 mL via INTRAVENOUS

## 2023-04-02 MED ORDER — LEVOTHYROXINE SODIUM 150 MCG PO TABS: 150.0000 ug | ORAL_TABLET | Freq: Every day | ORAL | 0 refills | Status: AC

## 2023-04-02 NOTE — ED Notes (Signed)
ED Provider at bedside. 

## 2023-04-02 NOTE — ED Triage Notes (Signed)
Pt c/o body aches, dizziness, and swelling to face, hands and feet. Pt states she had syncopal episode yesterday.

## 2023-04-02 NOTE — Discharge Instructions (Signed)
Take your next dose of your thyroid medication tomorrow morning.

## 2023-04-02 NOTE — Telephone Encounter (Signed)
Spoke with [facility] concerning a TSH they faxed over of [phone]. This result has been scanned into media. Notified them that we would have to refer this Abnormal TSH back to them since we  have not seen this pt since 2022. She has cancelled and no showed appts. Also let them know that per Dr Dorris Fetch she can still make an appt to see him but we would not be able to take care of this lab result since she has not shown up to any visits.

## 2023-04-03 NOTE — ED Provider Notes (Signed)
[facility] Provider Note   CSN: [id number] Arrival date & time: 04/02/23  1059     History  Chief Complaint  Patient presents with   Generalized Body Aches    Peggy [name] is a 32 y.o. female presenting with multiple complaints including generalized bodyaches, drowsiness, reduced energy level, she has complaints of swelling in her face, her hands and feet become swollen if she is on her feet for long periods of time and she endorses general lightheadedness with positional changes, cold intolerance and reports had a near syncopal event yesterday.  She has been out of her levothyroxine for at least the past 3 months.  For various reasons she had been unable to get follow-up care with her endocrinologist and so was unable to get refills of this medication.  She denies nausea or vomiting, chest pain, palpitations fevers, abdominal pain.  The history is provided by the patient.       Home Medications Prior to Admission medications   Medication Sig Start Date End Date Taking? Authorizing Provider  levothyroxine (SYNTHROID) 150 MCG tablet Take 1 tablet (150 mcg total) by mouth daily before breakfast. 04/02/23  Yes Indigo Chaddock, Almyra Free, PA-C  AIMOVIG 70 MG/ML SOAJ Inject 70 mg into the skin every 30 (thirty) days. 01/24/22   [provider]  albuterol (PROVENTIL) (2.5 MG/3ML) 0.083% nebulizer solution Take 3 mLs (2.5 mg total) by nebulization every 4 (four) hours as needed for wheezing or shortness of breath. 02/08/23   Volney American, PA-C  albuterol (VENTOLIN HFA) 108 (90 Base) MCG/ACT inhaler Inhale 2 puffs into the lungs every 4 (four) hours as needed for wheezing or shortness of breath. 02/08/23   Volney American, PA-C  budesonide-formoterol [facility]) 160-4.5 MCG/ACT inhaler Inhale 2 puffs into the lungs 2 (two) times daily. 02/04/22   Dara Hoyer, FNP  cetirizine (ZYRTEC) 10 MG tablet Take 1 tablet (10 mg total) by mouth  daily. 04/11/22   Dara Hoyer, FNP  Cholecalciferol (VITAMIN D3 PO) Take by mouth.    [provider]  cyclobenzaprine (FLEXERIL) 10 MG tablet Take 1 tablet (10 mg total) by mouth 2 (two) times daily as needed for muscle spasms. 06/03/22   Leath-Warren, Alda Lea, NP  cyclobenzaprine (FLEXERIL) 10 MG tablet Take 1 tablet (10 mg total) by mouth every 8 (eight) hours as needed for muscle spasms. Patient not taking: Reported on 06/20/2022 06/11/22   Florian Buff, MD  famotidine (PEPCID) 20 MG tablet TAKE 1 TABLET BY MOUTH TWICE A DAY 04/11/22   Ambs, Kathrine Cords, FNP  fluticasone (FLONASE) 50 MCG/ACT nasal spray Place 1-2 sprays into both nostrils daily. 03/19/22   [provider]  ibuprofen (ADVIL) 800 MG tablet Take 1 tablet (800 mg total) by mouth every 8 (eight) hours as needed. 06/03/22   Leath-Warren, Alda Lea, NP  ketorolac (TORADOL) 10 MG tablet TAKE 1 TABLET BY MOUTH EVERY 8 HOURS AS NEEDED. 08/12/22   Florian Buff, MD  levocetirizine (XYZAL) 5 MG tablet Take 1 tablet (5 mg total) by mouth every evening. 01/28/22   Jaynee Eagles, PA-C  medroxyPROGESTERone (DEPO-PROVERA) 150 MG/ML injection Inject 1 mL (150 mg total) into the muscle every 3 (three) months. 06/20/22   Estill Dooms, NP  metoprolol tartrate (LOPRESSOR) 25 MG tablet Take 0.5 tablets (12.5 mg total) by mouth 2 (two) times daily. 10/15/21   Arnoldo Lenis, MD  montelukast (SINGULAIR) 10 MG tablet Take 1 tablet (10  mg total) by mouth at bedtime. 02/04/22   Dara Hoyer, FNP  omeprazole (PRILOSEC) 40 MG capsule TAKE 1 CAPSULE (40 MG TOTAL) BY MOUTH DAILY. TAKE 30-60 MIN BEFORE FIRST MEAL OF THE DAY 07/17/22   Tanda Rockers, MD  ondansetron (ZOFRAN-ODT) 4 MG disintegrating tablet Take 1 tablet (4 mg total) by mouth every 8 (eight) hours as needed for nausea or vomiting. 03/12/23   Volney American, PA-C  predniSONE (DELTASONE) 20 MG tablet Take 2 tablets (40 mg total) by mouth daily with breakfast. 02/08/23   Volney American, PA-C  VENTOLIN HFA 108 (90 Base) MCG/ACT inhaler TAKE 2 PUFFS BY MOUTH EVERY 6 HOURS AS NEEDED FOR WHEEZE OR SHORTNESS OF BREATH 09/25/22   Ambs, Kathrine Cords, FNP      Allergies    Metronidazole    Review of Systems   Review of Systems  Constitutional:  Positive for activity change. Negative for fever.  HENT:  Negative for congestion and sore throat.   Eyes: Negative.  Negative for visual disturbance.  Respiratory:  Negative for chest tightness and shortness of breath.   Cardiovascular:  Negative for chest pain and palpitations.  Gastrointestinal:  Negative for abdominal pain, nausea and vomiting.  Genitourinary: Negative.  Negative for dysuria.  Musculoskeletal:  Negative for arthralgias, joint swelling and neck pain.  Skin: Negative.  Negative for rash and wound.  Neurological:  Positive for syncope, weakness and light-headedness. Negative for dizziness, numbness and headaches.  Psychiatric/Behavioral: Negative.  Negative for confusion.     Physical Exam Updated Vital Signs BP 116/70   Pulse 69   Temp 98.7 F (37.1 C) (Oral)   Resp 14   Ht 5' (1.524 m)   Wt 72.6 kg   SpO2 99%   BMI 31.25 kg/m  Physical Exam Vitals and nursing note reviewed.  Constitutional:      Appearance: She is well-developed.  HENT:     Head: Normocephalic and atraumatic.  Eyes:     Conjunctiva/sclera: Conjunctivae normal.  Cardiovascular:     Rate and Rhythm: Normal rate and regular rhythm.     Heart sounds: Normal heart sounds.  Pulmonary:     Effort: Pulmonary effort is normal.     Breath sounds: Normal breath sounds. No wheezing.  Abdominal:     General: Bowel sounds are normal.     Palpations: Abdomen is soft.     Tenderness: There is no abdominal tenderness.  Musculoskeletal:        General: Normal range of motion.     Cervical back: Normal range of motion.  Skin:    General: Skin is warm and dry.     Coloration: Skin is pale.  Neurological:     General: No focal  deficit present.     Mental Status: She is alert and oriented to person, place, and time.     Sensory: Sensation is intact.  Psychiatric:        Mood and Affect: Affect is flat.        Speech: Speech normal.     ED Results / Procedures / Treatments   Labs (all labs ordered are listed, but only abnormal results are displayed) Labs Reviewed  COMPREHENSIVE METABOLIC PANEL - Abnormal; Notable for the following components:      Result Value   Creatinine, Ser 1.28 (*)    GFR, Estimated 58 (*)    All other components within normal limits  TSH - Abnormal; Notable for the following components:  TSH 126.044 (*)    All other components within normal limits  CBC WITH DIFFERENTIAL/PLATELET  HCG, QUANTITATIVE, PREGNANCY  URINALYSIS, ROUTINE W REFLEX MICROSCOPIC    EKG None  Radiology No results found.  Procedures Procedures    Medications Ordered in ED Medications  sodium chloride 0.9 % bolus 1,000 mL (0 mLs Intravenous Stopped 04/02/23 1407)  levothyroxine (SYNTHROID) tablet 150 mcg (150 mcg Oral Given 04/02/23 1552)    ED Course/ Medical Decision Making/ A&P                             Medical Decision Making Patient who has been noncompliant with her thyroid medication as she has been unable to get refills presenting with symptoms suggesting a hypothyroid state.  She has stable vital signs, no exam findings or history suggest myxedema crisis.  Amount and/or Complexity of Data Reviewed Labs: ordered.    Details: Urinalysis is negative, her c-Met is normal except for an elevated creatinine at 1.28.  TSH 126 confirming significant hypothyroidism.  Risk Prescription drug management. Risk Details: Patient is started on her prior dose of 150 mcg daily, first dose was given here.  She is scheduled to see her primary provider next week, she was encouraged to keep this appointment.           Final Clinical Impression(s) / ED Diagnoses Final diagnoses:  Hypothyroidism,  unspecified type    Rx / DC Orders ED Discharge Orders          Ordered    levothyroxine (SYNTHROID) 150 MCG tablet  Daily before breakfast        04/02/23 1551              Burgess Amor, PA-C 04/04/23 0000    Cathren Laine, MD 04/04/23 1154

## 2023-04-07 DIAGNOSIS — Z0001 Encounter for general adult medical examination with abnormal findings: Secondary | ICD-10-CM | POA: Diagnosis not present

## 2023-04-07 DIAGNOSIS — G43009 Migraine without aura, not intractable, without status migrainosus: Secondary | ICD-10-CM | POA: Diagnosis not present

## 2023-04-07 DIAGNOSIS — R002 Palpitations: Secondary | ICD-10-CM | POA: Diagnosis not present

## 2023-04-07 DIAGNOSIS — J4530 Mild persistent asthma, uncomplicated: Secondary | ICD-10-CM | POA: Diagnosis not present

## 2023-04-07 DIAGNOSIS — E039 Hypothyroidism, unspecified: Secondary | ICD-10-CM | POA: Diagnosis not present

## 2023-04-07 DIAGNOSIS — R09A2 Foreign body sensation, throat: Secondary | ICD-10-CM | POA: Diagnosis not present

## 2023-04-07 DIAGNOSIS — Z6832 Body mass index (BMI) 32.0-32.9, adult: Secondary | ICD-10-CM | POA: Diagnosis not present

## 2023-04-07 DIAGNOSIS — R5383 Other fatigue: Secondary | ICD-10-CM | POA: Diagnosis not present

## 2023-04-07 DIAGNOSIS — R030 Elevated blood-pressure reading, without diagnosis of hypertension: Secondary | ICD-10-CM | POA: Diagnosis not present

## 2023-04-07 DIAGNOSIS — K219 Gastro-esophageal reflux disease without esophagitis: Secondary | ICD-10-CM | POA: Diagnosis not present

## 2023-04-07 DIAGNOSIS — N938 Other specified abnormal uterine and vaginal bleeding: Secondary | ICD-10-CM | POA: Diagnosis not present

## 2023-04-07 DIAGNOSIS — F17210 Nicotine dependence, cigarettes, uncomplicated: Secondary | ICD-10-CM | POA: Diagnosis not present

## 2023-04-07 DIAGNOSIS — R7989 Other specified abnormal findings of blood chemistry: Secondary | ICD-10-CM | POA: Diagnosis not present

## 2023-04-07 DIAGNOSIS — E348 Other specified endocrine disorders: Secondary | ICD-10-CM | POA: Diagnosis not present

## 2023-04-07 DIAGNOSIS — R000 Tachycardia, unspecified: Secondary | ICD-10-CM | POA: Diagnosis not present

## 2023-04-10 DIAGNOSIS — R7989 Other specified abnormal findings of blood chemistry: Secondary | ICD-10-CM | POA: Diagnosis not present

## 2023-04-16 ENCOUNTER — Other Ambulatory Visit (HOSPITAL_COMMUNITY): Payer: Self-pay | Admitting: Neurology

## 2023-04-16 ENCOUNTER — Other Ambulatory Visit (HOSPITAL_BASED_OUTPATIENT_CLINIC_OR_DEPARTMENT_OTHER): Payer: Self-pay | Admitting: Physician Assistant

## 2023-04-16 ENCOUNTER — Other Ambulatory Visit (HOSPITAL_COMMUNITY): Payer: Self-pay | Admitting: Physician Assistant

## 2023-04-16 DIAGNOSIS — E348 Other specified endocrine disorders: Secondary | ICD-10-CM

## 2023-04-21 DIAGNOSIS — R7989 Other specified abnormal findings of blood chemistry: Secondary | ICD-10-CM | POA: Diagnosis not present

## 2023-05-07 ENCOUNTER — Ambulatory Visit (HOSPITAL_COMMUNITY): Payer: Medicaid Other

## 2023-05-08 ENCOUNTER — Ambulatory Visit (HOSPITAL_COMMUNITY): Payer: Medicaid Other

## 2023-05-11 ENCOUNTER — Ambulatory Visit (HOSPITAL_COMMUNITY)
Admission: RE | Admit: 2023-05-11 | Discharge: 2023-05-11 | Disposition: A | Discharge status: Home / Self Care | Payer: Medicaid Other | Source: Ambulatory Visit | Attending: Physician Assistant | Admitting: Physician Assistant

## 2023-05-11 DIAGNOSIS — E348 Other specified endocrine disorders: Secondary | ICD-10-CM | POA: Diagnosis not present

## 2023-05-23 ENCOUNTER — Other Ambulatory Visit: Payer: Self-pay | Admitting: *Deleted

## 2023-05-23 MED ORDER — VENTOLIN HFA 108 (90 BASE) MCG/ACT IN AERS: INHALATION_SPRAY | RESPIRATORY_TRACT | 0 refills | Status: DC

## 2023-05-27 ENCOUNTER — Ambulatory Visit: Payer: Medicaid Other | Admitting: Diagnostic Neuroimaging

## 2023-06-05 ENCOUNTER — Other Ambulatory Visit: Payer: Self-pay | Admitting: Obstetrics & Gynecology

## 2023-06-05 ENCOUNTER — Other Ambulatory Visit: Payer: Self-pay | Admitting: Family Medicine

## 2023-06-05 MED ORDER — MEDROXYPROGESTERONE ACETATE 150 MG/ML IM SUSP: 150.0000 mg | INTRAMUSCULAR | 3 refills | Status: DC

## 2023-06-05 NOTE — Telephone Encounter (Signed)
Albuterol inhaler was sent in last week by a nurse patient was notified and she also scheduled an OV with Dr. Allena Katz on 06/09/23 for further refills.

## 2023-06-05 NOTE — Telephone Encounter (Signed)
Requested medication (s) are due for refill today: no  Requested medication (s) are on the active medication list: yes  Last refill:  03/12/23  Future visit scheduled: no  Notes to clinic:  Unable to refill per protocol, cannot delegate.    Requested Prescriptions  Pending Prescriptions Disp Refills   ondansetron (ZOFRAN-ODT) 4 MG disintegrating tablet [Pharmacy Med Name: ONDANSETRON ODT 4MG  TABLETS] 20 tablet 0    Sig: DISSOLVE 1 TABLET(4 MG) ON THE TONGUE EVERY 8 HOURS AS NEEDED FOR NAUSEA OR VOMITING     There is no refill protocol information for this order

## 2023-06-09 ENCOUNTER — Ambulatory Visit: Payer: Medicaid Other | Admitting: Internal Medicine

## 2023-06-17 ENCOUNTER — Ambulatory Visit: Payer: Medicaid Other

## 2023-06-19 ENCOUNTER — Ambulatory Visit: Payer: Medicaid Other

## 2023-07-21 ENCOUNTER — Emergency Department (HOSPITAL_COMMUNITY): Payer: Medicaid Other

## 2023-07-21 ENCOUNTER — Encounter (HOSPITAL_COMMUNITY): Payer: Self-pay

## 2023-07-21 ENCOUNTER — Emergency Department (HOSPITAL_COMMUNITY)
Admission: EM | Admit: 2023-07-21 | Discharge: 2023-07-21 | Disposition: A | Discharge status: Home / Self Care | Payer: Medicaid Other | Attending: Emergency Medicine | Admitting: Emergency Medicine

## 2023-07-21 ENCOUNTER — Other Ambulatory Visit: Payer: Self-pay

## 2023-07-21 DIAGNOSIS — R42 Dizziness and giddiness: Secondary | ICD-10-CM | POA: Diagnosis not present

## 2023-07-21 DIAGNOSIS — M25551 Pain in right hip: Secondary | ICD-10-CM | POA: Diagnosis not present

## 2023-07-21 DIAGNOSIS — W108XXA Fall (on) (from) other stairs and steps, initial encounter: Secondary | ICD-10-CM | POA: Insufficient documentation

## 2023-07-21 DIAGNOSIS — Z043 Encounter for examination and observation following other accident: Secondary | ICD-10-CM | POA: Diagnosis not present

## 2023-07-21 LAB — CBC
HCT: 43.2 % (ref 36.0–46.0)
Hemoglobin: 14.1 g/dL (ref 12.0–15.0)
MCH: 31 pg (ref 26.0–34.0)
MCHC: 32.6 g/dL (ref 30.0–36.0)
MCV: 94.9 fL (ref 80.0–100.0)
Platelets: 357 10*3/uL (ref 150–400)
RBC: 4.55 MIL/uL (ref 3.87–5.11)
RDW: 13.6 % (ref 11.5–15.5)
WBC: 8.8 10*3/uL (ref 4.0–10.5)
nRBC: 0 % (ref 0.0–0.2)

## 2023-07-21 LAB — BASIC METABOLIC PANEL
Anion gap: 7 (ref 5–15)
BUN: 12 mg/dL (ref 6–20)
CO2: 25 mmol/L (ref 22–32)
Calcium: 9.2 mg/dL (ref 8.9–10.3)
Chloride: 106 mmol/L (ref 98–111)
Creatinine, Ser: 0.78 mg/dL (ref 0.44–1.00)
GFR, Estimated: >60 mL/min (ref >60)
Glucose, Bld: 81 mg/dL (ref 70–99)
Potassium: 4 mmol/L (ref 3.5–5.1)
Sodium: 138 mmol/L (ref 135–145)

## 2023-07-21 LAB — PREGNANCY, URINE: Preg Test, Ur: NEGATIVE

## 2023-07-21 LAB — URINALYSIS, ROUTINE W REFLEX MICROSCOPIC
Bilirubin Urine: NEGATIVE
Glucose, UA: NEGATIVE mg/dL
Hgb urine dipstick: NEGATIVE
Ketones, ur: NEGATIVE mg/dL
Nitrite: NEGATIVE
Protein, ur: NEGATIVE mg/dL
Specific Gravity, Urine: 1.015 (ref 1.005–1.030)
pH: 6 (ref 5.0–8.0)

## 2023-07-21 LAB — POC URINE PREG, ED: Preg Test, Ur: NEGATIVE

## 2023-07-21 MED ORDER — SODIUM CHLORIDE 0.9 % IV BOLUS
1000.0000 mL | Freq: Once | INTRAVENOUS | Status: AC
  Administered 2023-07-21: 1000 mL via INTRAVENOUS

## 2023-07-21 MED ORDER — IBUPROFEN 800 MG PO TABS
800.0000 mg | ORAL_TABLET | Freq: Once | ORAL | Status: AC
  Administered 2023-07-21: 800 mg via ORAL
  Filled 2023-07-21: qty 1

## 2023-07-21 MED ORDER — MECLIZINE HCL 12.5 MG PO TABS
12.5000 mg | ORAL_TABLET | Freq: Once | ORAL | Status: AC
  Administered 2023-07-21: 12.5 mg via ORAL
  Filled 2023-07-21: qty 1

## 2023-07-21 MED ORDER — MECLIZINE HCL 12.5 MG PO TABS: 12.5000 mg | ORAL_TABLET | Freq: Three times a day (TID) | ORAL | 0 refills | Status: DC | PRN

## 2023-07-21 NOTE — Discharge Instructions (Addendum)
Evaluation today was reassuring. Sent meclizine to your pharmacy for your dizziness. Please follow up with your doctor. Please return if symptoms worsen.

## 2023-07-21 NOTE — ED Provider Notes (Signed)
[place] EMERGENCY DEPARTMENT AT [facility] Provider Note   CSN: [id number] Arrival date & time: 07/21/23  7829     History  Chief Complaint  Patient presents with   Dizziness   Hip Pain   HPI Peggy [name] is a 32 y.o. female with history of asthma presenting for hip pain and dizziness after a fall.  Fall was on Saturday.  States she was walking down steps and tripped and fell landing on her right hip and she is certain she hit her head as well.  Not reporting right hip pain and states she has been lightheaded and feels dizzy.  Denies room spinning sensation or loss of balance. Improves with sitting down and is intermittent.  Still ambulating without complication.  Will discharge.  Think she may also be dehydrated.  Denies nausea vomiting diarrhea.  Reports a mild headache.  Denies fever.   Dizziness Hip Pain       Home Medications Prior to Admission medications   Medication Sig Start Date End Date Taking? Authorizing Provider  meclizine (ANTIVERT) 12.5 MG tablet Take 1 tablet (12.5 mg total) by mouth 3 (three) times daily as needed for dizziness. 07/21/23  Yes Riki Sheer K, PA-C  AIMOVIG 70 MG/ML SOAJ Inject 70 mg into the skin every 30 (thirty) days. 01/24/22   [provider]  albuterol (PROVENTIL) (2.5 MG/3ML) 0.083% nebulizer solution Take 3 mLs (2.5 mg total) by nebulization every 4 (four) hours as needed for wheezing or shortness of breath. 02/08/23   Particia Nearing, PA-C  albuterol (VENTOLIN HFA) 108 (90 Base) MCG/ACT inhaler Inhale 2 puffs into the lungs every 4 (four) hours as needed for wheezing or shortness of breath. 02/08/23   Particia Nearing, PA-C  budesonide-formoterol [facility]) 160-4.5 MCG/ACT inhaler Inhale 2 puffs into the lungs 2 (two) times daily. 02/04/22   Hetty Blend, FNP  cetirizine (ZYRTEC) 10 MG tablet Take 1 tablet (10 mg total) by mouth daily. 04/11/22   Hetty Blend, FNP  Cholecalciferol (VITAMIN D3 PO) Take  by mouth.    [provider]  cyclobenzaprine (FLEXERIL) 10 MG tablet Take 1 tablet (10 mg total) by mouth 2 (two) times daily as needed for muscle spasms. 06/03/22   Leath-Warren, Sadie Haber, NP  cyclobenzaprine (FLEXERIL) 10 MG tablet Take 1 tablet (10 mg total) by mouth every 8 (eight) hours as needed for muscle spasms. Patient not taking: Reported on 06/20/2022 06/11/22   Lazaro Arms, MD  famotidine (PEPCID) 20 MG tablet TAKE 1 TABLET BY MOUTH TWICE A DAY 04/11/22   Ambs, Norvel Richards, FNP  fluticasone (FLONASE) 50 MCG/ACT nasal spray Place 1-2 sprays into both nostrils daily. 03/19/22   [provider]  ibuprofen (ADVIL) 800 MG tablet Take 1 tablet (800 mg total) by mouth every 8 (eight) hours as needed. 06/03/22   Leath-Warren, Sadie Haber, NP  ketorolac (TORADOL) 10 MG tablet TAKE 1 TABLET BY MOUTH EVERY 8 HOURS AS NEEDED. 08/12/22   Lazaro Arms, MD  levocetirizine (XYZAL) 5 MG tablet Take 1 tablet (5 mg total) by mouth every evening. 01/28/22   Wallis Bamberg, PA-C  levothyroxine (SYNTHROID) 150 MCG tablet Take 1 tablet (150 mcg total) by mouth daily before breakfast. 04/02/23   Idol, Raynelle Fanning, PA-C  medroxyPROGESTERone (DEPO-PROVERA) 150 MG/ML injection Inject 1 mL (150 mg total) into the muscle every 3 (three) months. 06/05/23   Adline Potter, NP  metoprolol tartrate (LOPRESSOR) 25 MG tablet Take 0.5 tablets (12.5  mg total) by mouth 2 (two) times daily. 10/15/21   Antoine Poche, MD  montelukast (SINGULAIR) 10 MG tablet Take 1 tablet (10 mg total) by mouth at bedtime. 02/04/22   Hetty Blend, FNP  omeprazole (PRILOSEC) 40 MG capsule TAKE 1 CAPSULE (40 MG TOTAL) BY MOUTH DAILY. TAKE 30-60 MIN BEFORE FIRST MEAL OF THE DAY 07/17/22   Nyoka Cowden, MD  ondansetron (ZOFRAN-ODT) 4 MG disintegrating tablet Take 1 tablet (4 mg total) by mouth every 8 (eight) hours as needed for nausea or vomiting. 03/12/23   Particia Nearing, PA-C  predniSONE (DELTASONE) 20 MG tablet Take 2 tablets (40  mg total) by mouth daily with breakfast. 02/08/23   Particia Nearing, PA-C  VENTOLIN HFA 108 (90 Base) MCG/ACT inhaler TAKE 2 PUFFS BY MOUTH EVERY 6 HOURS AS NEEDED FOR WHEEZE OR SHORTNESS OF BREATH 05/23/23   Ambs, Norvel Richards, FNP      Allergies    Metronidazole    Review of Systems   Review of Systems  Neurological:  Positive for dizziness.    Physical Exam   Vitals:   07/21/23 1054 07/21/23 1100  BP: 114/69 (!) 118/90  Pulse: 75 79  Resp: 18 18  Temp: 98.4 F (36.9 C)   SpO2: 98% 98%    CONSTITUTIONAL:  well-appearing, NAD NEURO:  GCS 15. Speech is goal oriented. No deficits appreciated to CN III-XII; symmetric eyebrow raise, no facial drooping, tongue midline. Patient has equal grip strength bilaterally with 5/5 strength against resistance in all major muscle groups bilaterally. Sensation to light touch intact. Patient moves extremities without ataxia. Normal finger-nose-finger. Patient ambulatory with steady gait. Head: atraumatic EYES:  eyes equal and reactive ENT/NECK:  Supple, no stridor  CARDIO:  Regular rate and rhythm, appears well-perfused  PULM:  No respiratory distress, CTAB GI/GU:  non-distended, soft MSK/SPINE:  No gross deformities, no edema, moves all extremities, active and passive range of motion of both hips appears normal and does not elicit tenderness per patient SKIN:  no rash, atraumatic  *Additional and/or pertinent findings included in MDM below   ED Results / Procedures / Treatments   Labs (all labs ordered are listed, but only abnormal results are displayed) Labs Reviewed  URINALYSIS, ROUTINE W REFLEX MICROSCOPIC - Abnormal; Notable for the following components:      Result Value   Leukocytes,Ua TRACE (*)    Bacteria, UA RARE (*)    All other components within normal limits  BASIC METABOLIC PANEL  CBC  PREGNANCY, URINE  POC URINE PREG, ED    EKG None  Radiology DG Hip Unilat With Pelvis 2-3 Views Right  Result Date:  07/21/2023 CLINICAL DATA:  Fall 2 days ago, mild right hip pain. EXAM: DG HIP (WITH OR WITHOUT PELVIS) 2-3V RIGHT COMPARISON:  CT abdomen/pelvis 06/05/2022 FINDINGS: There is no acute fracture or dislocation. Bony alignment is normal. Femoroacetabular joint spaces are preserved. The SI joints and symphysis pubis are intact. The soft tissues are unremarkable. IMPRESSION: Normal hip radiographs. Electronically Signed   By: Lesia Hausen M.D.   On: 07/21/2023 12:28   CT Head Wo Contrast  Result Date: 07/21/2023 CLINICAL DATA:  Fall 2 days ago onto right side. EXAM: CT HEAD WITHOUT CONTRAST TECHNIQUE: Contiguous axial images were obtained from the base of the skull through the vertex without intravenous contrast. RADIATION DOSE REDUCTION: This exam was performed according to the departmental dose-optimization program which includes automated exposure control, adjustment of the mA and/or kV according  to patient size and/or use of iterative reconstruction technique. COMPARISON:  Brain MRI 05/11/2023 FINDINGS: Brain: There is no acute intracranial hemorrhage, extra-axial fluid collection, or acute infarct Parenchymal volume is normal. The ventricles are normal in size. Gray-white differentiation is preserved The pituitary and suprasellar region are normal. There is no mass lesion there is no mass effect or midline shift. Vascular: No hyperdense vessel or unexpected calcification. Skull: Normal. Negative for fracture or focal lesion. Sinuses/Orbits: There is mild mucosal thickening in the imaged paranasal sinuses. The imaged globes and orbits are unremarkable. Other: A left mastoid effusion is unchanged. IMPRESSION: 1. No acute intracranial pathology. 2. Unchanged left mastoid effusion. Electronically Signed   By: Lesia Hausen M.D.   On: 07/21/2023 12:26    Procedures Procedures    Medications Ordered in ED Medications  meclizine (ANTIVERT) tablet 12.5 mg (has no administration in time range)  ibuprofen (ADVIL)  tablet 800 mg (800 mg Oral Given 07/21/23 1238)  sodium chloride 0.9 % bolus 1,000 mL (1,000 mLs Intravenous New Bag/Given 07/21/23 1211)    ED Course/ Medical Decision Making/ A&P                             Medical Decision Making Amount and/or Complexity of Data Reviewed Labs: ordered. Radiology: ordered.  Risk Prescription drug management.   32 yo well appearing female presenting for dizziness and hip pain after a fall Saturday. Exam was unremarkable. Labs with acute abnormality. CT head and xray of right hip without acute abnormality. Did mention stable mastoid effusion to patient. Work up does not suggest mastoiditis. Suspect dizziness is of peripheral etiology or could have sustained a mild concussion in the fall. Sent meclizine to pharmacy. Advised conservative treatment for concussion. Patient stated she felt better after treatment. Advised to f/u with PCP. Discussed pertinent return precautions. Vitals stable. Discharged in good condition.         Final Clinical Impression(s) / ED Diagnoses Final diagnoses:  Dizziness  Pain of right hip    Rx / DC Orders ED Discharge Orders          Ordered    meclizine (ANTIVERT) 12.5 MG tablet  3 times daily PRN        07/21/23 1407              Gareth Eagle, PA-C 07/21/23 1411    Vanetta Mulders, MD 07/23/23 [phone]

## 2023-07-21 NOTE — ED Triage Notes (Signed)
Pt fell on Saturday, onto right side, did bump head but "felt fine". Woke up yesterday lightheaded and pressure in chest. States "can't think clearly" and is concerned if it's from fall.

## 2023-07-23 ENCOUNTER — Telehealth: Payer: Self-pay | Admitting: *Deleted

## 2023-07-23 NOTE — Transitions of Care (Post Inpatient/ED Visit) (Signed)
   07/23/2023  Name: Peggy [name] MRN: [id number] DOB: Mar 01, 1991  Today's TOC FU Call Status: Today's TOC FU Call Status:: Successful TOC FU Call Competed TOC FU Call Complete Date: 07/23/23  Transition Care Management Follow-up Telephone Call Date of Discharge: 07/21/23 [facility] Type of Discharge: Emergency Department Reason for ED Visit: Other: (dizziness, hip pain) How have you been since you were released from the hospital?: Better Any questions or concerns?: No  Items Reviewed: Did you receive and understand the discharge instructions provided?: Yes  RNCM unable to complete TOC, call was disconnected, RNCM was unable to reconnect with patient.  Medications Reviewed Today: Medications Reviewed Today   Medications were not reviewed in this encounter     Home Care and Equipment/Supplies:    Functional Questionnaire:    Follow up appointments reviewed:      Estanislado Emms RN, BSN [place]  [facility] RN Care Coordinator [phone]

## 2023-08-01 DIAGNOSIS — H7012 Chronic mastoiditis, left ear: Secondary | ICD-10-CM | POA: Diagnosis not present

## 2023-08-01 DIAGNOSIS — J322 Chronic ethmoidal sinusitis: Secondary | ICD-10-CM | POA: Diagnosis not present

## 2023-08-01 DIAGNOSIS — J320 Chronic maxillary sinusitis: Secondary | ICD-10-CM | POA: Diagnosis not present

## 2023-08-05 ENCOUNTER — Other Ambulatory Visit (HOSPITAL_COMMUNITY)
Admission: RE | Admit: 2023-08-05 | Discharge: 2023-08-05 | Disposition: A | Discharge status: Home / Self Care | Payer: Medicaid Other | Source: Ambulatory Visit | Attending: Obstetrics & Gynecology | Admitting: Obstetrics & Gynecology

## 2023-08-05 ENCOUNTER — Other Ambulatory Visit (INDEPENDENT_AMBULATORY_CARE_PROVIDER_SITE_OTHER): Payer: Medicaid Other | Admitting: *Deleted

## 2023-08-05 DIAGNOSIS — Z113 Encounter for screening for infections with a predominantly sexual mode of transmission: Secondary | ICD-10-CM | POA: Diagnosis not present

## 2023-08-05 DIAGNOSIS — N898 Other specified noninflammatory disorders of vagina: Secondary | ICD-10-CM

## 2023-08-05 NOTE — Progress Notes (Signed)
   NURSE VISIT- VAGINITIS/STD/POC  SUBJECTIVE:  Peggy [name] is a 32 y.o. [phone] GYN patientfemale here for a vaginal swab for vaginitis screening, STD screen.  She reports the following symptoms: discharge described as frothy for several  days. Denies abnormal vaginal bleeding, significant pelvic pain, fever, or UTI symptoms.  OBJECTIVE:  There were no vitals taken for this visit.  Appears well, in no apparent distress  ASSESSMENT: Vaginal swab for STD screen  PLAN: Self-collected vaginal probe for Gonorrhea, Chlamydia, Trichomonas, Bacterial Vaginosis, Yeast sent to lab Treatment: to be determined once results are received Follow-up as needed if symptoms persist/worsen, or new symptoms develop  Annamarie Dawley  08/05/2023 11:13 AM

## 2023-08-06 ENCOUNTER — Other Ambulatory Visit: Payer: Self-pay | Admitting: Adult Health

## 2023-08-06 MED ORDER — METRONIDAZOLE 0.75 % VA GEL: 1.0000 | Freq: Every day | VAGINAL | 0 refills | Status: DC

## 2023-08-06 NOTE — Progress Notes (Signed)
+  BV on vaginal swab, will rx metrogel  

## 2023-08-25 ENCOUNTER — Ambulatory Visit (INDEPENDENT_AMBULATORY_CARE_PROVIDER_SITE_OTHER): Payer: Medicaid Other | Admitting: Diagnostic Neuroimaging

## 2023-08-25 ENCOUNTER — Encounter: Payer: Self-pay | Admitting: Diagnostic Neuroimaging

## 2023-08-25 VITALS — BP 120/80 | HR 92 | Ht 62.0 in | Wt 151.2 lb

## 2023-08-25 DIAGNOSIS — G43109 Migraine with aura, not intractable, without status migrainosus: Secondary | ICD-10-CM

## 2023-08-25 DIAGNOSIS — R519 Headache, unspecified: Secondary | ICD-10-CM

## 2023-08-25 MED ORDER — RIZATRIPTAN BENZOATE 10 MG PO TBDP: 10.0000 mg | ORAL_TABLET | ORAL | 11 refills | Status: AC | PRN

## 2023-08-25 MED ORDER — AIMOVIG 70 MG/ML ~~LOC~~ SOAJ: 70.0000 mg | SUBCUTANEOUS | 6 refills | Status: DC

## 2023-08-25 MED ORDER — AMITRIPTYLINE HCL 25 MG PO TABS: 25.0000 mg | ORAL_TABLET | Freq: Every day | ORAL | 3 refills | Status: DC

## 2023-08-25 NOTE — Patient Instructions (Addendum)
MIGRAINE WITH AURA (2 per week)  MIGRAINE PREVENTION  LIFESTYLE CHANGES -Stop or avoid smoking -Decrease or avoid caffeine / alcohol -Eat and sleep on a regular schedule -Exercise several times per week - restart erenumab (Aimovig) 70mg  monthly (may increase to 140mg  monthly)   MIGRAINE RESCUE   - ibuprofen, tylenol as needed  - start rizatriptan (Maxalt) 10mg  as needed for breakthrough headache; may repeat x 1 after 2 hours; max 2 tabs per day or 8 per month    INSOMNIA / STRESS / ANXIETY - consider amitriptyline 25mg  at bedtime - follow up with psychiatry / psychology   HYPOTHYROIDISM - follow up with PCP / encdocrinology asap

## 2023-08-25 NOTE — Progress Notes (Signed)
[facility]  PATIENT: Peggy [name] DOB: 12-31-1990  REFERRING CLINICIAN: Beryle Beams, MD HISTORY FROM: patient  REASON FOR VISIT: new consult   HISTORICAL  CHIEF COMPLAINT:  Chief Complaint  Patient presents with   Follow-up    Patient in room #7 and alone. Patient states she here today to get the results for her imaging. Patient states she has a cyst on her brain.    HISTORY OF PRESENT ILLNESS:   32 year old female here for valuation of headaches, brain fog, insomnia and cyst on MRI brain.  Patient has had headaches and insomnia issues since approximately age 34 years old.  She describes frontal, occipital, squeezing and sharp pressure headaches with nausea, vomiting, dizziness, brain fog, confusion.  She has some lower grade daily headaches as well as at least 2 episodic flareups per week consistent with migraine with aura (see spots and sparkles).  Has been on topiramate and Aimovig in the past.  Aimovig seems to help better.  Has also used over-the-counter Tylenol and ibuprofen for rescue.  From the same time patient was having increasing racing thoughts, anxiety and stress, insomnia issues.  Has been on Ambien and Belsomra in the past.  Has not been to psychiatry or psychology.  Has not been on antianxiety medications in the past.  Averaging 3 to 4 hours of sleep.  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  Felt to be an incidental finding.   REVIEW OF SYSTEMS: Full 14 system review of systems performed and negative with exception of: as per HPI.  ALLERGIES: Allergies  Allergen Reactions   Metronidazole Nausea Only    Patient requests gel form for all future treatment of BV    HOME MEDICATIONS: Outpatient Medications Prior to Visit  Medication Sig Dispense Refill   albuterol (PROVENTIL) (2.5 MG/3ML) 0.083% nebulizer solution Take 3 mLs (2.5 mg  total) by nebulization every 4 (four) hours as needed for wheezing or shortness of breath. 75 mL 1   albuterol (VENTOLIN HFA) 108 (90 Base) MCG/ACT inhaler Inhale 2 puffs into the lungs every 4 (four) hours as needed for wheezing or shortness of breath. 18 g 1   budesonide-formoterol (SYMBICORT) 160-4.5 MCG/ACT inhaler Inhale 2 puffs into the lungs 2 (two) times daily. 1 each 5   cetirizine (ZYRTEC) 10 MG tablet Take 1 tablet (10 mg total) by mouth daily. 30 tablet 5   Cholecalciferol (VITAMIN D3 PO) Take by mouth.     famotidine (PEPCID) 20 MG tablet TAKE 1 TABLET BY MOUTH TWICE A DAY 180 tablet 5   levothyroxine (SYNTHROID) 150 MCG tablet Take 1 tablet (150 mcg total) by mouth daily before breakfast. 30 tablet 0   montelukast (SINGULAIR) 10 MG tablet Take 1 tablet (10 mg total) by mouth at bedtime. 30 tablet 5   omeprazole (PRILOSEC) 40 MG capsule TAKE 1 CAPSULE (40 MG TOTAL) BY MOUTH DAILY. TAKE 30-60 MIN BEFORE FIRST MEAL OF THE DAY 90 capsule 3   ondansetron (ZOFRAN-ODT) 4 MG disintegrating tablet Take 1 tablet (4 mg total) by mouth every 8 (eight) hours as needed for nausea or vomiting. 20 tablet 0   VENTOLIN HFA 108 (90 Base) MCG/ACT inhaler TAKE 2 PUFFS BY MOUTH EVERY 6 HOURS AS NEEDED FOR WHEEZE OR SHORTNESS OF BREATH 18 g 0   AIMOVIG 70 MG/ML SOAJ Inject 70 mg into the skin every 30 (  thirty) days. (Patient not taking: Reported on 08/25/2023)     cyclobenzaprine (FLEXERIL) 10 MG tablet Take 1 tablet (10 mg total) by mouth 2 (two) times daily as needed for muscle spasms. (Patient not taking: Reported on 08/25/2023) 20 tablet 0   cyclobenzaprine (FLEXERIL) 10 MG tablet Take 1 tablet (10 mg total) by mouth every 8 (eight) hours as needed for muscle spasms. (Patient not taking: Reported on 06/20/2022) 30 tablet 1   fluticasone (FLONASE) 50 MCG/ACT nasal spray Place 1-2 sprays into both nostrils daily. (Patient not taking: Reported on 08/25/2023)     ibuprofen (ADVIL) 800 MG tablet Take 1 tablet (800  mg total) by mouth every 8 (eight) hours as needed. (Patient not taking: Reported on 08/25/2023) 30 tablet 0   ketorolac (TORADOL) 10 MG tablet TAKE 1 TABLET BY MOUTH EVERY 8 HOURS AS NEEDED. (Patient not taking: Reported on 08/25/2023) 15 tablet 0   levocetirizine (XYZAL) 5 MG tablet Take 1 tablet (5 mg total) by mouth every evening. (Patient not taking: Reported on 08/25/2023) 90 tablet 0   meclizine (ANTIVERT) 12.5 MG tablet Take 1 tablet (12.5 mg total) by mouth 3 (three) times daily as needed for dizziness. (Patient not taking: Reported on 08/25/2023) 30 tablet 0   medroxyPROGESTERone (DEPO-PROVERA) 150 MG/ML injection Inject 1 mL (150 mg total) into the muscle every 3 (three) months. (Patient not taking: Reported on 08/25/2023) 1 mL 3   metoprolol tartrate (LOPRESSOR) 25 MG tablet Take 0.5 tablets (12.5 mg total) by mouth 2 (two) times daily. (Patient not taking: Reported on 08/25/2023) 90 tablet 1   metroNIDAZOLE (METROGEL) 0.75 % vaginal gel Place 1 Applicatorful vaginally at bedtime. (Patient not taking: Reported on 08/25/2023) 70 g 0   predniSONE (DELTASONE) 20 MG tablet Take 2 tablets (40 mg total) by mouth daily with breakfast. (Patient not taking: Reported on 08/25/2023) 10 tablet 0   No facility-administered medications prior to visit.    PAST MEDICAL HISTORY: Past Medical History:  Diagnosis Date   Abnormal Pap smear of cervix 06/22/2020   05/2020 pap LSIL negative HPV and GC/CHL  As per ASCCP guidelines repeat in 1 year, 5 year risk of CIN 3 + is 2%   Asthma    Gestational diabetes    Pyloric stenosis    Thyroid disease    Graves   Urticaria     PAST SURGICAL HISTORY: Past Surgical History:  Procedure Laterality Date   PYLOROMYOTOMY      FAMILY HISTORY: Family History  Problem Relation Age of Onset   Arthritis Mother    Cancer Mother        thyroid   Depression Mother    Hyperlipidemia Father    Asthma Father    Hypertension Father    Heart disease Father    COPD  Maternal Grandfather    Diabetes Paternal Grandmother    Arthritis Paternal Grandmother    Asthma Paternal Grandmother    Heart disease Paternal Grandmother    Depression Paternal Grandmother    Hypertension Paternal Grandmother    Hyperlipidemia Paternal Grandmother    Other Neg Hx     SOCIAL HISTORY: Social History   Socioeconomic History   Marital status: Divorced    Spouse name: [place] Edinger   Number of children: 2   Years of education: Not on file   Highest education level: Some college, no degree  Occupational History   Not on file  Tobacco Use   Smoking status: Every Day    Current  packs/day: 0.25    Average packs/day: 0.3 packs/day for 5.0 years (1.3 ttl pk-yrs)    Types: Cigarettes   Smokeless tobacco: Never   Tobacco comments:    2 cig/day  Vaping Use   Vaping status: Never Used  Substance and Sexual Activity   Alcohol use: Yes    Comment: rarely   Drug use: No   Sexual activity: Yes    Birth control/protection: Injection  Other Topics Concern   Not on file  Social History Narrative   Not on file   Social Determinants of Health   Financial Resource Strain: Low Risk  (06/20/2022)   Overall Financial Resource Strain (CARDIA)    Difficulty of Paying Living Expenses: Not very hard  Food Insecurity: No Food Insecurity (06/20/2022)   Hunger Vital Sign    Worried About Running Out of Food in the Last Year: Never true    Ran Out of Food in the Last Year: Never true  Transportation Needs: No Transportation Needs (06/20/2022)   PRAPARE - Transportation    Lack of Transportation (Medical): No    Lack of Transportation (Non-Medical): No  Physical Activity: Insufficiently Active (06/20/2022)   Exercise Vital Sign    Days of Exercise per Week: 4 days    Minutes of Exercise per Session: 30 min  Stress: Stress Concern Present (06/20/2022)   [employer] of Occupational Health - Occupational Stress Questionnaire    Feeling of Stress : To some extent  Social  Connections: Socially Isolated (06/20/2022)   Social Connection and Isolation Panel [NHANES]    Frequency of Communication with Friends and Family: Once a week    Frequency of Social Gatherings with Friends and Family: Once a week    Attends Religious Services: Never    Database administrator or Organizations: No    Attends Banker Meetings: Never    Marital Status: Divorced  Catering manager Violence: Not At Risk (06/20/2022)   Humiliation, Afraid, Rape, and Kick questionnaire    Fear of Current or Ex-Partner: No    Emotionally Abused: No    Physically Abused: No    Sexually Abused: No     PHYSICAL EXAM  GENERAL EXAM/CONSTITUTIONAL: Vitals:  Vitals:   08/25/23 0833  BP: 120/80  Pulse: 92  Weight: 151 lb 3.2 oz (68.6 kg)  Height: 5\' 2"  (1.575 m)   Body mass index is 27.65 kg/m. Wt Readings from Last 3 Encounters:  08/25/23 151 lb 3.2 oz (68.6 kg)  07/21/23 156 lb (70.8 kg)  04/02/23 160 lb (72.6 kg)   Patient is in no distress; well developed, nourished and groomed; neck is supple  CARDIOVASCULAR: Examination of carotid arteries is normal; no carotid bruits Regular rate and rhythm, no murmurs Examination of peripheral vascular system by observation and palpation is normal  EYES: Ophthalmoscopic exam of optic discs and posterior segments is normal; no papilledema or hemorrhages No results found.  MUSCULOSKELETAL: Gait, strength, tone, movements noted in Neurologic exam below  NEUROLOGIC: MENTAL STATUS:      No data to display         awake, alert, oriented to person, place and time recent and remote memory intact normal attention and concentration language fluent, comprehension intact, naming intact fund of knowledge appropriate  CRANIAL NERVE:  2nd - no papilledema on fundoscopic exam 2nd, 3rd, 4th, 6th - pupils equal and reactive to light, visual fields full to confrontation, extraocular muscles intact, no nystagmus 5th - facial sensation  symmetric 7th - facial  strength symmetric 8th - hearing intact 9th - palate elevates symmetrically, uvula midline 11th - shoulder shrug symmetric 12th - tongue protrusion midline  MOTOR:  normal bulk and tone, full strength in the BUE, BLE  SENSORY:  normal and symmetric to light touch, pinprick, temperature, vibration  COORDINATION:  finger-nose-finger, fine finger movements normal  REFLEXES:  deep tendon reflexes present and symmetric  GAIT/STATION:  narrow based gait     DIAGNOSTIC DATA (LABS, IMAGING, TESTING) - I reviewed patient records, labs, notes, testing and imaging myself where available.  Lab Results  Component Value Date   WBC 8.8 07/21/2023   HGB 14.1 07/21/2023   HCT 43.2 07/21/2023   MCV 94.9 07/21/2023   PLT 357 07/21/2023      Component Value Date/Time   NA 138 07/21/2023 1213   K 4.0 07/21/2023 1213   CL 106 07/21/2023 1213   CO2 25 07/21/2023 1213   GLUCOSE 81 07/21/2023 1213   BUN 12 07/21/2023 1213   CREATININE 0.78 07/21/2023 1213   CALCIUM 9.2 07/21/2023 1213   PROT 7.9 04/02/2023 1223   ALBUMIN 4.6 04/02/2023 1223   AST 39 04/02/2023 1223   ALT 26 04/02/2023 1223   ALKPHOS 43 04/02/2023 1223   BILITOT 0.5 04/02/2023 1223   GFRNONAA >60 07/21/2023 1213   GFRAA >60 08/27/2020 1437   No results found for: "CHOL", "HDL", "LDLCALC", "LDLDIRECT", "TRIG", "CHOLHDL" Lab Results  Component Value Date   HGBA1C 5.0 09/25/2018   No results found for: "VITAMINB12" Lab Results  Component Value Date   TSH 126.044 (H) 04/02/2023     05/13/23 MRI brain (with and without) [I reviewed images myself and agree with interpretation. -VRP]  1.  No evidence of an acute intracranial abnormality. 2. 8 mm pineal cyst, unchanged from the prior brain MRI of 10/04/2021. 3. Otherwise unremarkable non-contrast MRI appearance of the brain. 4. Paranasal sinus disease as described. 5. Large left mastoid effusion.   ASSESSMENT AND PLAN  32 y.o. year  old female here with:   Dx:  1. Migraine with aura and without status migrainosus, not intractable   2. Chronic daily headache      PLAN:  MIGRAINE WITH AURA (2 per week)  MIGRAINE PREVENTION  LIFESTYLE CHANGES -Stop or avoid smoking -Decrease or avoid caffeine / alcohol -Eat and sleep on a regular schedule -Exercise several times per week - restart erenumab (Aimovig) 70mg  monthly (may increase to 140mg  monthly)   MIGRAINE RESCUE   - ibuprofen, tylenol as needed  - start rizatriptan (Maxalt) 10mg  as needed for breakthrough headache; may repeat x 1 after 2 hours; max 2 tabs per day or 8 per month  Consider - ubrogepant Bernita Raisin) 100mg  as needed for breakthrough headache; may repeat x 1 after 2 hours; max 2 tabs per day or 8 per month - rimegepant (Nurtec) 75mg  as needed for breakthrough headache; max 8 per month   INSOMNIA / STRESS / ANXIETY - trial of amitriptyline 25mg  at bedtime - follow up with psychiatry / psychology (via PCP)  PINEAL CYST - incidental finding; stable from 2022 to 2024  HYPOTHYROIDISM - follow up with PCP / encdocrinology asap  Meds ordered this encounter  Medications   AIMOVIG 70 MG/ML SOAJ    Sig: Inject 70 mg into the skin every 30 (thirty) days.    Dispense:  1.12 mL    Refill:  6   rizatriptan (MAXALT-MLT) 10 MG disintegrating tablet    Sig: Take 1 tablet (10 mg total) by  mouth as needed for migraine. May repeat in 2 hours if needed    Dispense:  9 tablet    Refill:  11   amitriptyline (ELAVIL) 25 MG tablet    Sig: Take 1 tablet (25 mg total) by mouth at bedtime.    Dispense:  30 tablet    Refill:  3   Return in about 6 months (around 02/25/2024) for with NP.    Suanne Marker, MD 08/25/2023, 9:16 AM Certified in Neurology, Neurophysiology and Neuroimaging  [facility] [address] [place], [place] [address] [phone]

## 2023-09-02 ENCOUNTER — Encounter: Payer: Self-pay | Admitting: Emergency Medicine

## 2023-09-02 ENCOUNTER — Other Ambulatory Visit: Payer: Self-pay

## 2023-09-02 ENCOUNTER — Ambulatory Visit
Admission: EM | Admit: 2023-09-02 | Discharge: 2023-09-02 | Disposition: A | Discharge status: Home / Self Care | Payer: Medicaid Other | Attending: Family Medicine | Admitting: Family Medicine

## 2023-09-02 DIAGNOSIS — J4521 Mild intermittent asthma with (acute) exacerbation: Secondary | ICD-10-CM | POA: Diagnosis not present

## 2023-09-02 DIAGNOSIS — J22 Unspecified acute lower respiratory infection: Secondary | ICD-10-CM

## 2023-09-02 MED ORDER — PREDNISONE 20 MG PO TABS: 40.0000 mg | ORAL_TABLET | Freq: Every day | ORAL | 0 refills | Status: DC

## 2023-09-02 MED ORDER — AMOXICILLIN-POT CLAVULANATE 875-125 MG PO TABS: 1.0000 | ORAL_TABLET | Freq: Two times a day (BID) | ORAL | 0 refills | Status: DC

## 2023-09-02 NOTE — ED Triage Notes (Signed)
Pt reports nasal congestion and "cold" x2 weeks. Pt reports "chest feels hard to breath since this am." Hx of asthma.

## 2023-09-02 NOTE — ED Provider Notes (Signed)
[facility]    CSN: [id number] Arrival date & time: 09/02/23  1335      History   Chief Complaint Chief Complaint  Patient presents with   Nasal Congestion    HPI Peggy [name] is a 32 y.o. female.   Presenting today with 2-week history of what initially felt like a head cold, now with persistent and worsening productive cough, chest tightness, wheezing and heaviness.  States the initial symptoms and improve but now cough is worsening.  History of asthma on albuterol as needed, states her inhaler is helping somewhat.  Denies fever, chills, chest pain, significant shortness of breath, abdominal pain, nausea vomiting or diarrhea.  Taking Mucinex with mild temporary benefit.    Past Medical History:  Diagnosis Date   Abnormal Pap smear of cervix 06/22/2020   05/2020 pap LSIL negative HPV and GC/CHL  As per ASCCP guidelines repeat in 1 year, 5 year risk of CIN 3 + is 2%   Asthma    Gestational diabetes    Pyloric stenosis    Thyroid disease    Graves   Urticaria     Patient Active Problem List   Diagnosis Date Noted   Encounter for surveillance of injectable contraceptive 06/20/2022   Encounter for well woman exam with routine gynecological exam 06/20/2022   Asthmatic bronchitis , chronic 04/12/2022   Chest pain, musculoskeletal 04/12/2022   Not well controlled moderate persistent asthma 02/04/2022   Tobacco use 02/04/2022   Graves disease 02/08/2021   Vitamin D insufficiency 02/07/2021   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   Chronic urticaria 10/12/2020   Recurrent infections 10/12/2020   Seasonal and perennial allergic rhinitis 10/12/2020   Mild persistent asthma, uncomplicated 10/12/2020   Abnormal Pap smear of cervix 06/22/2020   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/09/2020   Graves' disease 01/24/2020   Depression screening 05/25/2019   History of gestational diabetes 10/28/2018   Low vitamin D level 02/12/2017   Current smoker  07/25/2014    Past Surgical History:  Procedure Laterality Date   PYLOROMYOTOMY      OB History     Gravida  4   Para  4   Term  4   Preterm  0   AB  0   Living  4      SAB  0   IAB  0   Ectopic  0   Multiple  0   Live Births  4            Home Medications    Prior to Admission medications   Medication Sig Start Date End Date Taking? Authorizing Provider  amoxicillin-clavulanate (AUGMENTIN) 875-125 MG tablet Take 1 tablet by mouth every 12 (twelve) hours. 09/02/23  Yes Particia Nearing, PA-C  predniSONE (DELTASONE) 20 MG tablet Take 2 tablets (40 mg total) by mouth daily with breakfast. 09/02/23  Yes Particia Nearing, PA-C  AIMOVIG 70 MG/ML SOAJ Inject 70 mg into the skin every 30 (thirty) days. 08/25/23   Penumalli, Glenford Bayley, MD  albuterol (PROVENTIL) (2.5 MG/3ML) 0.083% nebulizer solution Take 3 mLs (2.5 mg total) by nebulization every 4 (four) hours as needed for wheezing or shortness of breath. 02/08/23   Particia Nearing, PA-C  albuterol (VENTOLIN HFA) 108 (90 Base) MCG/ACT inhaler Inhale 2 puffs into the lungs every 4 (four) hours as needed for wheezing or shortness of breath. 02/08/23   Particia Nearing, PA-C  amitriptyline (ELAVIL) 25 MG tablet Take 1 tablet (25 mg total)  by mouth at bedtime. 08/25/23   Penumalli, Glenford Bayley, MD  budesonide-formoterol (SYMBICORT) 160-4.5 MCG/ACT inhaler Inhale 2 puffs into the lungs 2 (two) times daily. 02/04/22   Hetty Blend, FNP  cetirizine (ZYRTEC) 10 MG tablet Take 1 tablet (10 mg total) by mouth daily. 04/11/22   Hetty Blend, FNP  Cholecalciferol (VITAMIN D3 PO) Take by mouth.    [provider]  famotidine (PEPCID) 20 MG tablet TAKE 1 TABLET BY MOUTH TWICE A DAY 04/11/22   Ambs, Norvel Richards, FNP  levothyroxine (SYNTHROID) 150 MCG tablet Take 1 tablet (150 mcg total) by mouth daily before breakfast. 04/02/23   Idol, Raynelle Fanning, PA-C  montelukast (SINGULAIR) 10 MG tablet Take 1 tablet (10 mg total) by mouth  at bedtime. 02/04/22   Hetty Blend, FNP  omeprazole (PRILOSEC) 40 MG capsule TAKE 1 CAPSULE (40 MG TOTAL) BY MOUTH DAILY. TAKE 30-60 MIN BEFORE FIRST MEAL OF THE DAY 07/17/22   Nyoka Cowden, MD  ondansetron (ZOFRAN-ODT) 4 MG disintegrating tablet Take 1 tablet (4 mg total) by mouth every 8 (eight) hours as needed for nausea or vomiting. 03/12/23   Particia Nearing, PA-C  rizatriptan (MAXALT-MLT) 10 MG disintegrating tablet Take 1 tablet (10 mg total) by mouth as needed for migraine. May repeat in 2 hours if needed 08/25/23   Penumalli, Glenford Bayley, MD  VENTOLIN HFA 108 (90 Base) MCG/ACT inhaler TAKE 2 PUFFS BY MOUTH EVERY 6 HOURS AS NEEDED FOR WHEEZE OR SHORTNESS OF BREATH 05/23/23   Ambs, Norvel Richards, FNP    Family History Family History  Problem Relation Age of Onset   Arthritis Mother    Cancer Mother        thyroid   Depression Mother    Hyperlipidemia Father    Asthma Father    Hypertension Father    Heart disease Father    COPD Maternal Grandfather    Diabetes Paternal Grandmother    Arthritis Paternal Grandmother    Asthma Paternal Grandmother    Heart disease Paternal Grandmother    Depression Paternal Grandmother    Hypertension Paternal Grandmother    Hyperlipidemia Paternal Grandmother    Other Neg Hx     Social History Social History   Tobacco Use   Smoking status: Every Day    Current packs/day: 0.25    Average packs/day: 0.3 packs/day for 5.0 years (1.3 ttl pk-yrs)    Types: Cigarettes   Smokeless tobacco: Never   Tobacco comments:    2 cig/day  Vaping Use   Vaping status: Never Used  Substance Use Topics   Alcohol use: Yes    Comment: rarely   Drug use: No     Allergies   Metronidazole   Review of Systems Review of Systems Per HPI  Physical Exam Triage Vital Signs ED Triage Vitals [09/02/23 1409]  Encounter Vitals Group     BP 122/83     Systolic BP Percentile      Diastolic BP Percentile      Pulse Rate 71     Resp 20     Temp 98.7 F  (37.1 C)     Temp Source Oral     SpO2 97 %     Weight      Height      Head Circumference      Peak Flow      Pain Score 3     Pain Loc      Pain Education  Exclude from Growth Chart    No data found.  Updated Vital Signs BP 122/83 (BP Location: Right Arm)   Pulse 71   Temp 98.7 F (37.1 C) (Oral)   Resp 20   LMP 08/21/2023 (Approximate)   SpO2 97%   Visual Acuity Right Eye Distance:   Left Eye Distance:   Bilateral Distance:    Right Eye Near:   Left Eye Near:    Bilateral Near:     Physical Exam Vitals and nursing note reviewed.  Constitutional:      Appearance: Normal appearance.  HENT:     Head: Atraumatic.     Right Ear: Tympanic membrane and external ear normal.     Left Ear: Tympanic membrane and external ear normal.     Nose: Congestion present.     Mouth/Throat:     Mouth: Mucous membranes are moist.     Pharynx: Posterior oropharyngeal erythema present.  Eyes:     Extraocular Movements: Extraocular movements intact.     Conjunctiva/sclera: Conjunctivae normal.  Cardiovascular:     Rate and Rhythm: Normal rate and regular rhythm.     Heart sounds: Normal heart sounds.  Pulmonary:     Effort: Pulmonary effort is normal.     Breath sounds: Wheezing present. No rales.  Musculoskeletal:        General: Normal range of motion.     Cervical back: Normal range of motion and neck supple.  Skin:    General: Skin is warm and dry.  Neurological:     Mental Status: She is alert and oriented to person, place, and time.  Psychiatric:        Mood and Affect: Mood normal.        Thought Content: Thought content normal.      UC Treatments / Results  Labs (all labs ordered are listed, but only abnormal results are displayed) Labs Reviewed - No data to display  EKG   Radiology No results found.  Procedures Procedures (including critical care time)  Medications Ordered in UC Medications - No data to display  Initial Impression /  Assessment and Plan / UC Course  I have reviewed the triage vital signs and the nursing notes.  Pertinent labs & imaging results that were available during my care of the patient were reviewed by me and considered in my medical decision making (see chart for details).     Given duration and worsening course as well as asthma exacerbation, treat with prednisone, Augmentin, inhaler regimen and over-the-counter supportive medications and home care.  Return for worsening symptoms. Final Clinical Impressions(s) / UC Diagnoses   Final diagnoses:  Lower respiratory infection  Mild intermittent asthma with acute exacerbation   Discharge Instructions   None    ED Prescriptions     Medication Sig Dispense Auth. Provider   amoxicillin-clavulanate (AUGMENTIN) 875-125 MG tablet Take 1 tablet by mouth every 12 (twelve) hours. 14 tablet Particia Nearing, [place]   predniSONE (DELTASONE) 20 MG tablet Take 2 tablets (40 mg total) by mouth daily with breakfast. 10 tablet Particia Nearing, [place]      PDMP not reviewed this encounter.   Particia Nearing, [place] 09/02/23 1448

## 2023-09-05 ENCOUNTER — Encounter (HOSPITAL_COMMUNITY): Payer: Self-pay

## 2023-09-05 ENCOUNTER — Other Ambulatory Visit: Payer: Self-pay

## 2023-09-05 ENCOUNTER — Emergency Department (HOSPITAL_COMMUNITY): Payer: Medicaid Other

## 2023-09-05 ENCOUNTER — Emergency Department (HOSPITAL_COMMUNITY)
Admission: EM | Admit: 2023-09-05 | Discharge: 2023-09-05 | Disposition: A | Discharge status: Home / Self Care | Payer: Medicaid Other | Attending: Emergency Medicine | Admitting: Emergency Medicine

## 2023-09-05 DIAGNOSIS — R0602 Shortness of breath: Secondary | ICD-10-CM | POA: Diagnosis not present

## 2023-09-05 DIAGNOSIS — B9789 Other viral agents as the cause of diseases classified elsewhere: Secondary | ICD-10-CM | POA: Diagnosis not present

## 2023-09-05 DIAGNOSIS — J069 Acute upper respiratory infection, unspecified: Secondary | ICD-10-CM | POA: Insufficient documentation

## 2023-09-05 DIAGNOSIS — R062 Wheezing: Secondary | ICD-10-CM | POA: Diagnosis present

## 2023-09-05 DIAGNOSIS — J45901 Unspecified asthma with (acute) exacerbation: Secondary | ICD-10-CM | POA: Diagnosis not present

## 2023-09-05 DIAGNOSIS — Z20822 Contact with and (suspected) exposure to covid-19: Secondary | ICD-10-CM | POA: Diagnosis not present

## 2023-09-05 LAB — BASIC METABOLIC PANEL
Anion gap: 12 (ref 5–15)
BUN: 9 mg/dL (ref 6–20)
CO2: 22 mmol/L (ref 22–32)
Calcium: 9.2 mg/dL (ref 8.9–10.3)
Chloride: 104 mmol/L (ref 98–111)
Creatinine, Ser: 0.85 mg/dL (ref 0.44–1.00)
GFR, Estimated: >60 mL/min (ref >60)
Glucose, Bld: 88 mg/dL (ref 70–99)
Potassium: 3.5 mmol/L (ref 3.5–5.1)
Sodium: 138 mmol/L (ref 135–145)

## 2023-09-05 LAB — CBC WITH DIFFERENTIAL/PLATELET
Abs Immature Granulocytes: 0.02 10*3/uL (ref 0.00–0.07)
Basophils Absolute: 0.1 10*3/uL (ref 0.0–0.1)
Basophils Relative: 1 %
Eosinophils Absolute: 0.4 10*3/uL (ref 0.0–0.5)
Eosinophils Relative: 4 %
HCT: 40.6 % (ref 36.0–46.0)
Hemoglobin: 13.7 g/dL (ref 12.0–15.0)
Immature Granulocytes: 0 %
Lymphocytes Relative: 26 %
Lymphs Abs: 2.3 10*3/uL (ref 0.7–4.0)
MCH: 30.7 pg (ref 26.0–34.0)
MCHC: 33.7 g/dL (ref 30.0–36.0)
MCV: 91 fL (ref 80.0–100.0)
Monocytes Absolute: 0.5 10*3/uL (ref 0.1–1.0)
Monocytes Relative: 6 %
Neutro Abs: 5.3 10*3/uL (ref 1.7–7.7)
Neutrophils Relative %: 63 %
Platelets: 432 10*3/uL — ABNORMAL HIGH (ref 150–400)
RBC: 4.46 MIL/uL (ref 3.87–5.11)
RDW: 14.1 % (ref 11.5–15.5)
WBC: 8.6 10*3/uL (ref 4.0–10.5)
nRBC: 0 % (ref 0.0–0.2)

## 2023-09-05 LAB — HCG, QUANTITATIVE, PREGNANCY: hCG, Beta Chain, Quant, S: <1 m[IU]/mL (ref <5)

## 2023-09-05 LAB — SARS CORONAVIRUS 2 BY RT PCR: SARS Coronavirus 2 by RT PCR: NEGATIVE

## 2023-09-05 MED ORDER — IPRATROPIUM-ALBUTEROL 0.5-2.5 (3) MG/3ML IN SOLN
3.0000 mL | RESPIRATORY_TRACT | Status: AC
  Administered 2023-09-05 (×3): 3 mL via RESPIRATORY_TRACT
  Filled 2023-09-05: qty 9

## 2023-09-05 MED ORDER — METHYLPREDNISOLONE SODIUM SUCC 125 MG IJ SOLR
125.0000 mg | Freq: Once | INTRAMUSCULAR | Status: AC
  Administered 2023-09-05: 125 mg via INTRAVENOUS
  Filled 2023-09-05: qty 2

## 2023-09-05 NOTE — ED Triage Notes (Signed)
Pt c/o cough, congestion, wheezing for 2 wks. Wooke up this am, couldn't catch breath. Did breathing treatment around 0530 with no relief.

## 2023-09-05 NOTE — ED Provider Notes (Signed)
[place] EMERGENCY DEPARTMENT AT [facility] Provider Note   CSN: [id number] Arrival date & time: 09/05/23  7035     History  Chief Complaint  Patient presents with   Cough   Wheezing    Peggy [name] is a 32 y.o. female with PMH as listed below who presents with SOB, wheezing.   Per chart review patient presented on 09/02/2023 to [facility] urgent care for a 2-week history of cold/flulike symptoms now with persistent wheezing and productive cough chest tightness.  Does have a history of asthma.  Was treated with inhaler regimen, prednisone and Augmentin. Today patient presents to ED stating that she has had chest congestion, cough productive of green mucus, chills but no fever, wheezing, x 2 weeks and woke up this morning and "could not catch my breath."  This has happened before with her asthma and she states that she is prone to get pneumonia.  She was taking Mucinex to try to prevent that but it did not help.  She has been using her inhalers and nebulizers more frequently which helped for short time but her symptoms get worse again.  Endorses some nausea and a couple episodes of posttussive vomiting.  Also endorses a heaviness in her chest and pain with deep breaths.  Endorses rhinorrhea and bodyaches but no sore throat.  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  She tested for COVID which was negative.  Denies any abdominal pain, leg swelling.   Past Medical History:  Diagnosis Date   Abnormal Pap smear of cervix 06/22/2020   05/2020 pap LSIL negative HPV and GC/CHL  As per ASCCP guidelines repeat in 1 year, 5 year risk of CIN 3 + is 2%   Asthma    Gestational diabetes    Pyloric stenosis    Thyroid disease    Graves   Urticaria        Home Medications Prior to Admission medications   Medication Sig Start Date End Date Taking? Authorizing Provider  AIMOVIG 70 MG/ML SOAJ Inject 70 mg into the skin every 30 (thirty) days. 08/25/23    Penumalli, Glenford Bayley, MD  albuterol (PROVENTIL) (2.5 MG/3ML) 0.083% nebulizer solution Take 3 mLs (2.5 mg total) by nebulization every 4 (four) hours as needed for wheezing or shortness of breath. 02/08/23   Particia Nearing, PA-C  albuterol (VENTOLIN HFA) 108 (90 Base) MCG/ACT inhaler Inhale 2 puffs into the lungs every 4 (four) hours as needed for wheezing or shortness of breath. 02/08/23   Particia Nearing, PA-C  amitriptyline (ELAVIL) 25 MG tablet Take 1 tablet (25 mg total) by mouth at bedtime. 08/25/23   Penumalli, Glenford Bayley, MD  amoxicillin-clavulanate (AUGMENTIN) 875-125 MG tablet Take 1 tablet by mouth every 12 (twelve) hours. 09/02/23   Particia Nearing, PA-C  budesonide-formoterol [facility]) 160-4.5 MCG/ACT inhaler Inhale 2 puffs into the lungs 2 (two) times daily. 02/04/22   Hetty Blend, FNP  cetirizine (ZYRTEC) 10 MG tablet Take 1 tablet (10 mg total) by mouth daily. 04/11/22   Hetty Blend, FNP  Cholecalciferol (VITAMIN D3 PO) Take by mouth.    [provider]  famotidine (PEPCID) 20 MG tablet TAKE 1 TABLET BY MOUTH TWICE A DAY 04/11/22   Ambs, Norvel Richards, FNP  levothyroxine (SYNTHROID) 150 MCG tablet Take 1 tablet (150 mcg total) by mouth daily before breakfast. 04/02/23   Idol, Raynelle Fanning, PA-C  montelukast (SINGULAIR) 10 MG tablet Take 1 tablet (10  mg total) by mouth at bedtime. 02/04/22   Hetty Blend, FNP  omeprazole (PRILOSEC) 40 MG capsule TAKE 1 CAPSULE (40 MG TOTAL) BY MOUTH DAILY. TAKE 30-60 MIN BEFORE FIRST MEAL OF THE DAY 07/17/22   Nyoka Cowden, MD  ondansetron (ZOFRAN-ODT) 4 MG disintegrating tablet Take 1 tablet (4 mg total) by mouth every 8 (eight) hours as needed for nausea or vomiting. 03/12/23   Particia Nearing, PA-C  predniSONE (DELTASONE) 20 MG tablet Take 2 tablets (40 mg total) by mouth daily with breakfast. 09/02/23   Particia Nearing, PA-C  rizatriptan (MAXALT-MLT) 10 MG disintegrating tablet Take 1 tablet (10 mg total) by mouth as needed for  migraine. May repeat in 2 hours if needed 08/25/23   Penumalli, Glenford Bayley, MD  VENTOLIN HFA 108 (90 Base) MCG/ACT inhaler TAKE 2 PUFFS BY MOUTH EVERY 6 HOURS AS NEEDED FOR WHEEZE OR SHORTNESS OF BREATH 05/23/23   Ambs, Norvel Richards, FNP      Allergies    Metronidazole    Review of Systems   Review of Systems A 10 point review of systems was performed and is negative unless otherwise reported in HPI.  Physical Exam Updated Vital Signs BP 115/75 (BP Location: Left Arm)   Pulse 75   Temp 98.2 F (36.8 C) (Oral)   Resp 20   Ht 5\' 2"  (1.575 m)   Wt 68 kg   LMP 09/05/2023 (Exact Date) Comment: Started this am  SpO2 99%   BMI 27.44 kg/m  Physical Exam General: Normal appearing female, lying in bed.  HEENT: Sclera anicteric, MMM, trachea midline.  Cardiology: RRR, no murmurs/rubs/gallops. Resp: Normal respiratory rate and effort. Inspiratory and expiratory wheezing throughout.  Abd: Soft, non-tender, non-distended. No rebound tenderness or guarding.  GU: Deferred. MSK: No peripheral edema or signs of trauma.  Skin: warm, dry.  Neuro: A&Ox4, CNs II-XII grossly intact. MAEs. Sensation grossly intact.  Psych: Normal mood and affect.   ED Results / Procedures / Treatments   Labs (all labs ordered are listed, but only abnormal results are displayed) Labs Reviewed  CBC WITH DIFFERENTIAL/PLATELET - Abnormal; Notable for the following components:      Result Value   Platelets 432 (*)    All other components within normal limits  SARS CORONAVIRUS 2 BY RT PCR  BASIC METABOLIC PANEL  HCG, QUANTITATIVE, PREGNANCY    EKG EKG Interpretation Date/Time:  Friday September 05 2023 08:24:06 EDT Ventricular Rate:  65 PR Interval:  171 QRS Duration:  78 QT Interval:  406 QTC Calculation: 423 R Axis:   67  Text Interpretation: Sinus rhythm Confirmed by Vivi Barrack [phone]) on 09/05/2023 9:11:19 AM  Radiology DG Chest 2 View  Result Date: 09/05/2023 CLINICAL DATA:  asthma exacerbation EXAM:  CHEST - 2 VIEW COMPARISON:  04/17/2022 FINDINGS: Cardiomediastinal silhouette and pulmonary vasculature are within normal limits. Lungs are clear. IMPRESSION: No acute cardiopulmonary process. Electronically Signed   By: Acquanetta Belling M.D.   On: 09/05/2023 08:51    Procedures Procedures    Medications Ordered in ED Medications  ipratropium-albuterol (DUONEB) 0.5-2.5 (3) MG/3ML nebulizer solution 3 mL (3 mLs Nebulization Given 09/05/23 0902)  methylPREDNISolone sodium succinate (SOLU-MEDROL) 125 mg/2 mL injection 125 mg (125 mg Intravenous Given 09/05/23 0809)    ED Course/ Medical Decision Making/ A&P                          Medical Decision Making Amount and/or Complexity  of Data Reviewed Labs: ordered. Decision-making details documented in ED Course. Radiology: ordered. Decision-making details documented in ED Course.  Risk Prescription drug management.    This patient presents to the ED for concern of SOB/wheezing/cough, this involves an extensive number of treatment options, and is a complaint that carries with it a high risk of complications and morbidity.  I considered the following differential and admission for this acute, potentially life threatening condition.   MDM:    DDX for dyspnea includes but is not limited to:  Patient with inspiratory and expiratory wheezing in the setting of likely a viral upper respiratory illness with a history of asthma, most likely with an asthma exacerbation.  hest x-ray does not demonstrate any pneumonia, pleural effusion, pulmonary edema.  She has no leg swelling or signs or symptoms of DVT to indicate a PE or CHF and I believe this is less likely the cause of her symptoms. Patient PERCs out for PE.  EKG does not demonstrate any signs of ischemia or arrhythmia. Will treat with duonebs/solumedrol and reassess.  Clinical Course as of 09/05/23 0939  Fri Sep 05, 2023  0907 CBC, BMP, hcg unrevealing [HN]  0907 DG Chest 2 View neg [HN]  0908  Consulted to [facility] for help obtaining medications [HN]  0910 SARS Coronavirus 2 by RT PCR: NEGATIVE neg [HN]  0914 LCSW reviewed her chart and patient has Medicaid which means that her meds are covered and should have a 0-4 dollar max copay. Since she has insurance she does not qualify for the medication assistance program.  [HN]  [phone] Reevaluated the patient.  She has no wheezing in any of her lung fields now, she is breathing without any respiratory distress or hypoxia.  She states she feels much improved.  I discussed with the patient that she needs to pick up the prednisone and Augmentin prescribed urgent care and take them as prescribed beginning today.  Patient reports understanding.  Advised her to follow-up with her primary care physician within 1 week.  Given discharge instructions and return precautions, all questions answered to patient satisfaction. [HN]    Clinical Course User Index [HN] Loetta Rough, MD    Labs: I Ordered, and personally interpreted labs.  The pertinent results include:  those listed above  Imaging Studies ordered: I ordered imaging studies including CXR I independently visualized and interpreted imaging. I agree with the radiologist interpretation  Additional history obtained from chart review.    Cardiac Monitoring: The patient was maintained on a cardiac monitor.  I personally viewed and interpreted the cardiac monitored which showed an underlying rhythm of: NSR  Reevaluation: After the interventions noted above, I reevaluated the patient and found that they have :improved  Social Determinants of Health: Lives independently  Disposition:  DC w/ discharge instructions/return precautions. All questions answered to patient's satisfaction.    Co morbidities that complicate the patient evaluation  Past Medical History:  Diagnosis Date   Abnormal Pap smear of cervix 06/22/2020   05/2020 pap LSIL negative HPV and GC/CHL  As per ASCCP guidelines repeat in  1 year, 5 year risk of CIN 3 + is 2%   Asthma    Gestational diabetes    Pyloric stenosis    Thyroid disease    Graves   Urticaria      Medicines Meds ordered this encounter  Medications   ipratropium-albuterol (DUONEB) 0.5-2.5 (3) MG/3ML nebulizer solution 3 mL   methylPREDNISolone sodium succinate (SOLU-MEDROL) 125 mg/2 mL injection 125  mg    I have reviewed the patients home medicines and have made adjustments as needed  Problem List / ED Course: Problem List Items Addressed This Visit   None Visit Diagnoses     Exacerbation of asthma, unspecified asthma severity, unspecified whether persistent    -  Primary   Relevant Medications   ipratropium-albuterol (DUONEB) 0.5-2.5 (3) MG/3ML nebulizer solution 3 mL (Completed)   methylPREDNISolone sodium succinate (SOLU-MEDROL) 125 mg/2 mL injection 125 mg (Completed)   Viral URI with cough                       This note was created using dictation software, which may contain spelling or grammatical errors.    Loetta Rough, MD 09/05/23 [phone]

## 2023-09-05 NOTE — Discharge Instructions (Signed)
Thank you for coming to [facility] Emergency Department. You were seen for shortness of breath and wheezing. We did an exam, labs, and imaging, and these showed likely an asthma exacerbation. You were given duonebs and steroids in the ED which improved your symptoms. Please pick up the prednisone and augmentin as prescribed by urgent care and take them as prescribed.  Please follow up with your primary care provider within 1 week.   Do not hesitate to return to the ED or call 911 if you experience: -Worsening symptoms -Lightheadedness, passing out -Fevers/chills -Anything else that concerns you

## 2023-09-05 NOTE — ED Notes (Signed)
TOC consulted for medication assistance. CSW updated MD that pt has Medicaid and does not qualify for medication assistance program. CSW updated MD that pt has a 0-4 dollar copay on medications. TOC signing off.

## 2023-09-18 ENCOUNTER — Other Ambulatory Visit: Payer: Self-pay

## 2023-09-18 DIAGNOSIS — J3489 Other specified disorders of nose and nasal sinuses: Secondary | ICD-10-CM | POA: Diagnosis not present

## 2023-09-18 DIAGNOSIS — J320 Chronic maxillary sinusitis: Secondary | ICD-10-CM | POA: Diagnosis not present

## 2023-09-18 DIAGNOSIS — J329 Chronic sinusitis, unspecified: Secondary | ICD-10-CM | POA: Diagnosis not present

## 2023-09-19 LAB — SURGICAL PATHOLOGY

## 2023-09-25 DIAGNOSIS — J320 Chronic maxillary sinusitis: Secondary | ICD-10-CM | POA: Diagnosis not present

## 2023-09-25 DIAGNOSIS — Z9889 Other specified postprocedural states: Secondary | ICD-10-CM | POA: Diagnosis not present

## 2023-10-13 ENCOUNTER — Other Ambulatory Visit: Payer: Self-pay | Admitting: Adult Health

## 2023-10-13 MED ORDER — METRONIDAZOLE 0.75 % VA GEL: 1.0000 | Freq: Every day | VAGINAL | 0 refills | Status: DC

## 2023-10-13 NOTE — Progress Notes (Signed)
Rx sent in for metrogel

## 2023-11-19 ENCOUNTER — Encounter: Payer: Self-pay | Admitting: Adult Health

## 2023-11-19 ENCOUNTER — Ambulatory Visit: Payer: Medicaid Other | Admitting: Adult Health

## 2023-11-19 VITALS — BP 129/84 | HR 72 | Ht 60.0 in | Wt 148.0 lb

## 2023-11-19 DIAGNOSIS — F17200 Nicotine dependence, unspecified, uncomplicated: Secondary | ICD-10-CM

## 2023-11-19 DIAGNOSIS — Z3009 Encounter for other general counseling and advice on contraception: Secondary | ICD-10-CM | POA: Diagnosis not present

## 2023-11-19 DIAGNOSIS — Z30011 Encounter for initial prescription of contraceptive pills: Secondary | ICD-10-CM | POA: Diagnosis not present

## 2023-11-19 DIAGNOSIS — Z3202 Encounter for pregnancy test, result negative: Secondary | ICD-10-CM | POA: Diagnosis not present

## 2023-11-19 DIAGNOSIS — G43109 Migraine with aura, not intractable, without status migrainosus: Secondary | ICD-10-CM | POA: Diagnosis not present

## 2023-11-19 LAB — POCT URINE PREGNANCY: Preg Test, Ur: NEGATIVE

## 2023-11-19 MED ORDER — SLYND 4 MG PO TABS: 1.0000 | ORAL_TABLET | Freq: Every day | ORAL | Status: DC

## 2023-11-19 NOTE — Progress Notes (Signed)
  Subjective:     Patient ID: Peggy [name], female   DOB: Sep 23, 1991, 32 y.o.   MRN: [id number]  HPI Peggy [name] is a 32 year old white female, divorced, [id number] in wanting to discuss birth control, would like nuva ring.     Component Value Date/Time   DIAGPAP  06/18/2021 1556    - Negative for Intraepithelial Lesions or Malignancy (NILM)   DIAGPAP - Benign reactive/reparative changes 06/18/2021 1556   DIAGPAP - Low grade squamous intraepithelial lesion (LSIL) (A) 06/16/2020 1237   HPVHIGH Negative 06/18/2021 1556   HPVHIGH Negative 06/16/2020 1237   ADEQPAP  06/18/2021 1556    Satisfactory for evaluation; transformation zone component PRESENT.   ADEQPAP  06/16/2020 1237    Satisfactory for evaluation; transformation zone component PRESENT.   ADEQPAP  02/06/2017 0000    Satisfactory for evaluation  endocervical/transformation zone component PRESENT.    PCP is [facility]  Review of Systems Denies MI,stroke, DVT, breast cancer Has migraines with aura, will having tingling in hands and sometimes feet and ears may ring Reviewed past medical,surgical, social and family history. Reviewed medications and allergies.     Objective:   Physical Exam BP 129/84 (BP Location: Right Arm, Patient Position: Sitting, Cuff Size: Normal)   Pulse 72   Ht 5' (1.524 m)   Wt 148 lb (67.1 kg)   LMP 11/04/2023   BMI 28.90 kg/m  UPT is negative Skin warm and dry. Lungs: clear to ausculation bilaterally. Cardiovascular: regular rate and rhythm.   Upstream - 11/19/23 1423       Pregnancy Intention Screening   Does the patient want to become pregnant in the next year? No    Does the patient's partner want to become pregnant in the next year? No    Would the patient like to discuss contraceptive options today? Yes      Contraception Wrap Up   Current Method No Contraceptive Precautions    End Method Oral Contraceptive    Contraception Counseling Provided Yes    How was the end contraceptive  method provided? Prescription                Assessment:     1. Pregnancy test negative - POCT urine pregnancy  2. General counseling  and advice on contraceptive management  Discussed options, POP,depo,nexplanon and IUD, and she will try the pill  3. Smoker   4. Migraine with aura and without status migrainosus, not intractable   5. Encounter for initial prescription of contraceptive pills Gave 3 packs slynd, can start now and use condoms for at least 1 pack Meds ordered this encounter  Medications   Drospirenone (SLYND) 4 MG TABS    Sig: Take 1 tablet (4 mg total) by mouth daily.    Order Specific Question:   Supervising Provider    Answer:   Lazaro Arms [[id number]]      Plan:     Follow up in 10 weeks for ROS

## 2023-12-30 ENCOUNTER — Other Ambulatory Visit: Payer: Self-pay | Admitting: Family Medicine

## 2024-01-08 ENCOUNTER — Ambulatory Visit (INDEPENDENT_AMBULATORY_CARE_PROVIDER_SITE_OTHER): Payer: Medicaid Other | Admitting: *Deleted

## 2024-01-08 VITALS — BP 128/78 | HR 75 | Wt 145.6 lb

## 2024-01-08 DIAGNOSIS — N926 Irregular menstruation, unspecified: Secondary | ICD-10-CM | POA: Diagnosis not present

## 2024-01-08 DIAGNOSIS — Z3202 Encounter for pregnancy test, result negative: Secondary | ICD-10-CM

## 2024-01-08 LAB — POCT URINE PREGNANCY: Preg Test, Ur: NEGATIVE

## 2024-01-08 NOTE — Progress Notes (Signed)
   NURSE VISIT- PREGNANCY CONFIRMATION   SUBJECTIVE:  Peggy [name] is a 33 y.o. [phone] female at certain LMP of Patient's last menstrual period was 12/04/2023 (approximate). Here for pregnancy confirmation.  Home pregnancy test:  faint positive x1.  She reports no complaints.  She is not taking prenatal vitamins.    OBJECTIVE:  BP 128/78 (BP Location: Right Arm, Patient Position: Sitting, Cuff Size: Normal)   Pulse 75   Wt 145 lb 9.6 oz (66 kg)   LMP 12/04/2023 (Approximate)   BMI 28.44 kg/m   Appears well, in no apparent distress  Results for orders placed or performed in visit on 01/08/24 (from the past 24 hours)  POCT urine pregnancy   Collection Time: 01/08/24 12:12 PM  Result Value Ref Range   Preg Test, Ur Negative Negative    ASSESSMENT: Negative pregnancy test    PLAN: HCG today. Schedule for dating ultrasound TBD Prenatal vitamins: plans to begin OTC ASAP   Nausea medicines: not currently needed   OB packet given: No  Delon Revelo  01/08/2024 12:16 PM

## 2024-01-09 LAB — BETA HCG QUANT (REF LAB): hCG Quant: <1 m[IU]/mL

## 2024-01-28 ENCOUNTER — Ambulatory Visit: Payer: Medicaid Other | Admitting: Adult Health

## 2024-02-04 ENCOUNTER — Ambulatory Visit
Admission: EM | Admit: 2024-02-04 | Discharge: 2024-02-04 | Disposition: A | Discharge status: Home / Self Care | Payer: Medicaid Other | Attending: Nurse Practitioner | Admitting: Nurse Practitioner

## 2024-02-04 DIAGNOSIS — B349 Viral infection, unspecified: Secondary | ICD-10-CM | POA: Insufficient documentation

## 2024-02-04 DIAGNOSIS — R059 Cough, unspecified: Secondary | ICD-10-CM | POA: Diagnosis not present

## 2024-02-04 LAB — POCT RAPID STREP A (OFFICE): Rapid Strep A Screen: NEGATIVE

## 2024-02-04 MED ORDER — PROMETHAZINE-DM 6.25-15 MG/5ML PO SYRP: 5.0000 mL | ORAL_SOLUTION | Freq: Four times a day (QID) | ORAL | 0 refills | Status: DC | PRN

## 2024-02-04 MED ORDER — FLUTICASONE PROPIONATE 50 MCG/ACT NA SUSP: 2.0000 | Freq: Every day | NASAL | 0 refills | Status: DC

## 2024-02-04 NOTE — ED Triage Notes (Signed)
 Pt reports sore throat, sinus drainage, x 4 days  nausea and vomiting x 1 mo.

## 2024-02-04 NOTE — ED Provider Notes (Signed)
 [facility]    CSN: [id number] Arrival date & time: 02/04/24  1711      History   Chief Complaint No chief complaint on file.   HPI Peggy [name] is a 33 y.o. female.   The history is provided by the patient.   Patient presents for complaints of sore throat, postnasal drainage, and nausea and vomiting over the past 24 hours.  Patient states other symptoms started approximately 4 days ago.  Denies fever, chills, headache, ear pain, nasal congestion, runny nose, cough, abdominal pain, or rash.  Patient states that her children have been sick with the same or similar symptoms.  Reports she has been taking over-the-counter analgesics, and using normal saline nasal spray for symptoms.  Past Medical History:  Diagnosis Date   Abnormal Pap smear of cervix 06/22/2020   05/2020 pap LSIL negative HPV and GC/CHL  As per ASCCP guidelines repeat in 1 year, 5 year risk of CIN 3 + is 2%   Asthma    Gestational diabetes    Pyloric stenosis    Thyroid  disease    Graves   Urticaria     Patient Active Problem List   Diagnosis Date Noted   Migraine with aura and without status migrainosus, not intractable 11/19/2023   Smoker 11/19/2023   General counseling for prescription of oral contraceptives 11/19/2023   Pregnancy test negative 11/19/2023   Encounter for initial prescription of contraceptive pills 11/19/2023   General counseling and advice on contraceptive management 11/19/2023   Encounter for surveillance of injectable contraceptive 06/20/2022   Encounter for well woman exam with routine gynecological exam 06/20/2022   Asthmatic bronchitis , chronic (HCC) 04/12/2022   Chest pain, musculoskeletal 04/12/2022   Not well controlled moderate persistent asthma 02/04/2022   Tobacco use 02/04/2022   Graves disease 02/08/2021   Vitamin D  insufficiency 02/07/2021   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   Chronic urticaria 10/12/2020   Recurrent infections 10/12/2020    Seasonal and perennial allergic rhinitis 10/12/2020   Mild persistent asthma, uncomplicated 10/12/2020   Abnormal Pap smear of cervix 06/22/2020   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/09/2020   Graves' disease 01/24/2020   Depression screening 05/25/2019   History of gestational diabetes 10/28/2018   Low vitamin D  level 02/12/2017   Current smoker 07/25/2014    Past Surgical History:  Procedure Laterality Date   PYLOROMYOTOMY      OB History     Gravida  4   Para  4   Term  4   Preterm  0   AB  0   Living  4      SAB  0   IAB  0   Ectopic  0   Multiple  0   Live Births  4            Home Medications    Prior to Admission medications   Medication Sig Start Date End Date Taking? Authorizing Provider  fluticasone  (FLONASE ) 50 MCG/ACT nasal spray Place 2 sprays into both nostrils daily. 02/04/24  Yes Leath-Warren, Etta PARAS, NP  promethazine -dextromethorphan  (PROMETHAZINE -DM) 6.25-15 MG/5ML syrup Take 5 mLs by mouth 4 (four) times daily as needed for cough. 02/04/24  Yes Leath-Warren, Etta PARAS, NP  AIMOVIG  70 MG/ML SOAJ Inject 70 mg into the skin every 30 (thirty) days. Patient not taking: Reported on 01/08/2024 08/25/23   Penumalli, Vikram R, MD  albuterol  (PROVENTIL ) (2.5 MG/3ML) 0.083% nebulizer solution Take 3 mLs (2.5 mg total) by nebulization every 4 (four)  hours as needed for wheezing or shortness of breath. Patient not taking: Reported on 01/08/2024 02/08/23   Stuart Vernell Norris, PA-C  albuterol  (VENTOLIN  HFA) 108 [phone] Base) MCG/ACT inhaler Inhale 2 puffs into the lungs every 4 (four) hours as needed for wheezing or shortness of breath. Patient not taking: Reported on 01/08/2024 02/08/23   Stuart Vernell Norris, PA-C  amitriptyline  (ELAVIL ) 25 MG tablet Take 1 tablet (25 mg total) by mouth at bedtime. Patient not taking: Reported on 01/08/2024 08/25/23   Penumalli, Vikram R, MD  budesonide -formoterol  (SYMBICORT ) 160-4.5 MCG/ACT inhaler Inhale 2  puffs into the lungs 2 (two) times daily. Patient not taking: Reported on 11/19/2023 02/04/22   Cari Arlean HERO, FNP  cetirizine  (ZYRTEC ) 10 MG tablet Take 1 tablet (10 mg total) by mouth daily. Patient not taking: Reported on 01/08/2024 04/11/22   Cari Arlean HERO, FNP  Cholecalciferol (VITAMIN D3 PO) Take by mouth. Patient not taking: Reported on 01/08/2024    [provider]  Drospirenone  (SLYND ) 4 MG TABS Take 1 tablet (4 mg total) by mouth daily. Patient not taking: Reported on 01/08/2024 11/19/23   Signa Delon LABOR, NP  famotidine  (PEPCID ) 20 MG tablet TAKE 1 TABLET BY MOUTH TWICE A DAY Patient not taking: Reported on 01/08/2024 04/11/22   Cari Arlean HERO, FNP  levothyroxine  (SYNTHROID ) 150 MCG tablet Take 1 tablet (150 mcg total) by mouth daily before breakfast. 04/02/23   Idol, Julie, PA-C  montelukast  (SINGULAIR ) 10 MG tablet Take 1 tablet (10 mg total) by mouth at bedtime. Patient not taking: Reported on 11/19/2023 02/04/22   Cari Arlean HERO, FNP  omeprazole  (PRILOSEC) 40 MG capsule TAKE 1 CAPSULE (40 MG TOTAL) BY MOUTH DAILY. TAKE 30-60 MIN BEFORE FIRST MEAL OF THE DAY Patient not taking: Reported on 01/08/2024 07/17/22   Darlean Ozell NOVAK, MD  ondansetron  (ZOFRAN -ODT) 4 MG disintegrating tablet Take 1 tablet (4 mg total) by mouth every 8 (eight) hours as needed for nausea or vomiting. Patient not taking: Reported on 01/08/2024 03/12/23   Stuart Vernell Norris, PA-C  rizatriptan  (MAXALT -MLT) 10 MG disintegrating tablet Take 1 tablet (10 mg total) by mouth as needed for migraine. May repeat in 2 hours if needed Patient not taking: Reported on 01/08/2024 08/25/23   Penumalli, Eduard SAUNDERS, MD  VENTOLIN  HFA 108 (90 Base) MCG/ACT inhaler TAKE 2 PUFFS BY MOUTH EVERY 6 HOURS AS NEEDED FOR WHEEZE OR SHORTNESS OF BREATH Patient not taking: Reported on 01/08/2024 05/23/23   Ambs, Arlean HERO, FNP    Family History Family History  Problem Relation Age of Onset   Arthritis Mother    Cancer Mother        thyroid    Depression  Mother    Hyperlipidemia Father    Asthma Father    Hypertension Father    Heart disease Father    COPD Maternal Grandfather    Diabetes Paternal Grandmother    Arthritis Paternal Grandmother    Asthma Paternal Grandmother    Heart disease Paternal Grandmother    Depression Paternal Grandmother    Hypertension Paternal Grandmother    Hyperlipidemia Paternal Grandmother    Other Neg Hx     Social History Social History   Tobacco Use   Smoking status: Every Day    Current packs/day: 0.25    Average packs/day: 0.3 packs/day for 5.0 years (1.3 ttl pk-yrs)    Types: Cigarettes   Smokeless tobacco: Never   Tobacco comments:    2 cig/day  Vaping Use   Vaping  status: Never Used  Substance Use Topics   Alcohol use: Yes    Comment: rarely   Drug use: No     Allergies   Metronidazole    Review of Systems Review of Systems Per HPI  Physical Exam Triage Vital Signs ED Triage Vitals [02/04/24 1816]  Encounter Vitals Group     BP      Systolic BP Percentile      Diastolic BP Percentile      Pulse      Resp      Temp      Temp src      SpO2      Weight      Height      Head Circumference      Peak Flow      Pain Score 0     Pain Loc      Pain Education      Exclude from Growth Chart    No data found.  Updated Vital Signs BP 120/85 (BP Location: Right Arm)   Pulse 72   Temp 98.4 F (36.9 C) (Oral)   Resp 20   LMP 01/04/2024 (Exact Date)   SpO2 96%   Visual Acuity Right Eye Distance:   Left Eye Distance:   Bilateral Distance:    Right Eye Near:   Left Eye Near:    Bilateral Near:     Physical Exam Vitals and nursing note reviewed.  Constitutional:      General: She is not in acute distress.    Appearance: Normal appearance.  HENT:     Head: Normocephalic.     Right Ear: Tympanic membrane, ear canal and external ear normal.     Left Ear: Tympanic membrane, ear canal and external ear normal.     Nose: Congestion present.     Right Turbinates:  Enlarged and swollen.     Left Turbinates: Enlarged and swollen.     Right Sinus: No maxillary sinus tenderness or frontal sinus tenderness.     Left Sinus: No maxillary sinus tenderness or frontal sinus tenderness.     Mouth/Throat:     Lips: Pink.     Mouth: Mucous membranes are moist.     Pharynx: Uvula midline. Posterior oropharyngeal erythema and postnasal drip present. No pharyngeal swelling, oropharyngeal exudate or uvula swelling.  Eyes:     Extraocular Movements: Extraocular movements intact.     Conjunctiva/sclera: Conjunctivae normal.     Pupils: Pupils are equal, round, and reactive to light.  Cardiovascular:     Rate and Rhythm: Normal rate and regular rhythm.     Pulses: Normal pulses.     Heart sounds: Normal heart sounds.  Pulmonary:     Effort: Pulmonary effort is normal.     Breath sounds: Normal breath sounds.  Abdominal:     General: Bowel sounds are normal.     Palpations: Abdomen is soft.     Tenderness: There is no abdominal tenderness.  Musculoskeletal:     Cervical back: Normal range of motion.  Lymphadenopathy:     Cervical: No cervical adenopathy.  Skin:    General: Skin is warm and dry.  Neurological:     General: No focal deficit present.     Mental Status: She is alert and oriented to person, place, and time.  Psychiatric:        Mood and Affect: Mood normal.        Behavior: Behavior normal.      UC  Treatments / Results  Labs (all labs ordered are listed, but only abnormal results are displayed) Labs Reviewed  CULTURE, GROUP A STREP [facility])  POCT RAPID STREP A (OFFICE)    EKG   Radiology No results found.  Procedures Procedures (including critical care time)  Medications Ordered in UC Medications - No data to display  Initial Impression / Assessment and Plan / UC Course  I have reviewed the triage vital signs and the nursing notes.  Pertinent labs & imaging results that were available during my care of the patient were  reviewed by me and considered in my medical decision making (see chart for details).  The rapid strep test was negative.  Throat culture is pending.  Symptoms consistent with a viral illness.  Will provide symptomatic treatment for patient's cough with Promethazine  DM, and for postnasal drainage, fluticasone  50 mcg nasal spray was prescribed.  Supportive care recommendations were provided and discussed with patient to include fluids, rest, warm salt water gargles, and a soft diet.  Discussed indications regarding follow-up.  Patient was in agreement with this plan of care and verbalizes understanding.  All questions were answered.  Patient stable for discharge.  Final Clinical Impressions(s) / UC Diagnoses   Final diagnoses:  Viral illness  Cough, unspecified type     Discharge Instructions      The rapid strep test was negative.  A throat culture is pending.  You will be contacted if the pending test results are abnormal. Take medication as prescribed. Increase fluids and allow for plenty of rest. May use over-the-counter Tylenol  or ibuprofen  as needed for pain, fever, or general discomfort. Warm salt water gargles 3-4 times daily as needed for throat pain or discomfort. Recommend a soft diet to include soup, broth, yogurt, pudding, or Jell-O while symptoms persist. Symptoms should improve over the next 5 to 7 days.  You may follow-up in this clinic or with your primary care physician if symptoms fail to improve. Follow-up as needed.      ED Prescriptions     Medication Sig Dispense Auth. Provider   promethazine -dextromethorphan  (PROMETHAZINE -DM) 6.25-15 MG/5ML syrup Take 5 mLs by mouth 4 (four) times daily as needed for cough. 118 mL Leath-Warren, Etta PARAS, NP   fluticasone  (FLONASE ) 50 MCG/ACT nasal spray Place 2 sprays into both nostrils daily. 16 g Leath-Warren, Etta PARAS, NP      PDMP not reviewed this encounter.   Gilmer Etta PARAS, NP 02/05/24 [phone]

## 2024-02-04 NOTE — Discharge Instructions (Addendum)
 The rapid strep test was negative.  A throat culture is pending.  You will be contacted if the pending test results are abnormal. Take medication as prescribed. Increase fluids and allow for plenty of rest. May use over-the-counter Tylenol  or ibuprofen  as needed for pain, fever, or general discomfort. Warm salt water gargles 3-4 times daily as needed for throat pain or discomfort. Recommend a soft diet to include soup, broth, yogurt, pudding, or Jell-O while symptoms persist. Symptoms should improve over the next 5 to 7 days.  You may follow-up in this clinic or with your primary care physician if symptoms fail to improve. Follow-up as needed.

## 2024-02-07 LAB — CULTURE, GROUP A STREP (THRC)

## 2024-02-16 DIAGNOSIS — G4700 Insomnia, unspecified: Secondary | ICD-10-CM | POA: Diagnosis not present

## 2024-02-16 DIAGNOSIS — G43009 Migraine without aura, not intractable, without status migrainosus: Secondary | ICD-10-CM | POA: Diagnosis not present

## 2024-02-16 DIAGNOSIS — E039 Hypothyroidism, unspecified: Secondary | ICD-10-CM | POA: Diagnosis not present

## 2024-02-16 DIAGNOSIS — F419 Anxiety disorder, unspecified: Secondary | ICD-10-CM | POA: Diagnosis not present

## 2024-02-16 DIAGNOSIS — Z6828 Body mass index (BMI) 28.0-28.9, adult: Secondary | ICD-10-CM | POA: Diagnosis not present

## 2024-03-01 ENCOUNTER — Encounter: Payer: Self-pay | Admitting: Adult Health

## 2024-03-01 ENCOUNTER — Ambulatory Visit: Payer: Medicaid Other | Admitting: Adult Health

## 2024-03-01 NOTE — Progress Notes (Deleted)
 [facility]  PATIENT: Peggy [name] DOB: [birth year]  REFERRING CLINICIAN: [facility] HISTORY FROM: patient  REASON FOR VISIT: new consult   HISTORICAL  CHIEF COMPLAINT:  No chief complaint on file.   HISTORY OF PRESENT ILLNESS:   Update 03/01/2024 JM: Patient returns for 15-month follow-up.  At prior visit, she was restarted on Ajovy for migraine prevention and rizatriptan for rescue for migraines, was started on amitriptyline 25 mg nightly for insomnia, stress and anxiety.      Consult visit 08/25/2023 Dr. Marjory Lies: 33 year old female here for valuation of headaches, brain fog, insomnia and cyst on MRI brain.  Patient has had headaches and insomnia issues since approximately age 65 years old.  She describes frontal, occipital, squeezing and sharp pressure headaches with nausea, vomiting, dizziness, brain fog, confusion.  She has some lower grade daily headaches as well as at least 2 episodic flareups per week consistent with migraine with aura (see spots and sparkles).  Has been on topiramate and Aimovig in the past.  Aimovig seems to help better.  Has also used over-the-counter Tylenol and ibuprofen for rescue.  From the same time patient was having increasing racing thoughts, anxiety and stress, insomnia issues.  Has been on Ambien and Belsomra in the past.  Has not been to psychiatry or psychology.  Has not been on antianxiety medications in the past.  Averaging 3 to 4 hours of sleep.  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  Felt to be an incidental finding.   REVIEW OF SYSTEMS: Full 14 system review of systems performed and negative with exception of: as per HPI.  ALLERGIES: Allergies  Allergen Reactions   Metronidazole Nausea Only    Patient requests gel form for all future treatment of BV    HOME MEDICATIONS: Outpatient Medications  Prior to Visit  Medication Sig Dispense Refill   AIMOVIG 70 MG/ML SOAJ Inject 70 mg into the skin every 30 (thirty) days. (Patient not taking: Reported on 01/08/2024) 1.12 mL 6   albuterol (PROVENTIL) (2.5 MG/3ML) 0.083% nebulizer solution Take 3 mLs (2.5 mg total) by nebulization every 4 (four) hours as needed for wheezing or shortness of breath. (Patient not taking: Reported on 01/08/2024) 75 mL 1   albuterol (VENTOLIN HFA) 108 (90 Base) MCG/ACT inhaler Inhale 2 puffs into the lungs every 4 (four) hours as needed for wheezing or shortness of breath. (Patient not taking: Reported on 01/08/2024) 18 g 1   amitriptyline (ELAVIL) 25 MG tablet Take 1 tablet (25 mg total) by mouth at bedtime. (Patient not taking: Reported on 01/08/2024) 30 tablet 3   budesonide-formoterol (SYMBICORT) 160-4.5 MCG/ACT inhaler Inhale 2 puffs into the lungs 2 (two) times daily. (Patient not taking: Reported on 11/19/2023) 1 each 5   cetirizine (ZYRTEC) 10 MG tablet Take 1 tablet (10 mg total) by mouth daily. (Patient not taking: Reported on 01/08/2024) 30 tablet 5   Cholecalciferol (VITAMIN D3 PO) Take by mouth. (Patient not taking: Reported on 01/08/2024)     Drospirenone (SLYND) 4 MG TABS Take 1 tablet (4 mg total) by mouth daily. (Patient not taking: Reported on 01/08/2024)     famotidine (PEPCID) 20 MG tablet TAKE 1 TABLET BY MOUTH TWICE A DAY (Patient not taking: Reported on 01/08/2024) 180 tablet 5   fluticasone (FLONASE) 50 MCG/ACT nasal spray Place 2 sprays into both nostrils daily. 16  g 0   levothyroxine (SYNTHROID) 150 MCG tablet Take 1 tablet (150 mcg total) by mouth daily before breakfast. 30 tablet 0   montelukast (SINGULAIR) 10 MG tablet Take 1 tablet (10 mg total) by mouth at bedtime. (Patient not taking: Reported on 11/19/2023) 30 tablet 5   omeprazole (PRILOSEC) 40 MG capsule TAKE 1 CAPSULE (40 MG TOTAL) BY MOUTH DAILY. TAKE 30-60 MIN BEFORE FIRST MEAL OF THE DAY (Patient not taking: Reported on 01/08/2024) 90 capsule 3    ondansetron (ZOFRAN-ODT) 4 MG disintegrating tablet Take 1 tablet (4 mg total) by mouth every 8 (eight) hours as needed for nausea or vomiting. (Patient not taking: Reported on 01/08/2024) 20 tablet 0   promethazine-dextromethorphan (PROMETHAZINE-DM) 6.25-15 MG/5ML syrup Take 5 mLs by mouth 4 (four) times daily as needed for cough. 118 mL 0   rizatriptan (MAXALT-MLT) 10 MG disintegrating tablet Take 1 tablet (10 mg total) by mouth as needed for migraine. May repeat in 2 hours if needed (Patient not taking: Reported on 01/08/2024) 9 tablet 11   VENTOLIN HFA 108 (90 Base) MCG/ACT inhaler TAKE 2 PUFFS BY MOUTH EVERY 6 HOURS AS NEEDED FOR WHEEZE OR SHORTNESS OF BREATH (Patient not taking: Reported on 01/08/2024) 18 g 0   No facility-administered medications prior to visit.    PAST MEDICAL HISTORY: Past Medical History:  Diagnosis Date   Abnormal Pap smear of cervix 06/22/2020   05/2020 pap LSIL negative HPV and GC/CHL  As per ASCCP guidelines repeat in 1 year, 5 year risk of CIN 3 + is 2%   Asthma    Gestational diabetes    Pyloric stenosis    Thyroid disease    Graves   Urticaria     PAST SURGICAL HISTORY: Past Surgical History:  Procedure Laterality Date   PYLOROMYOTOMY      FAMILY HISTORY: Family History  Problem Relation Age of Onset   Arthritis Mother    Cancer Mother        thyroid   Depression Mother    Hyperlipidemia Father    Asthma Father    Hypertension Father    Heart disease Father    COPD Maternal Grandfather    Diabetes Paternal Grandmother    Arthritis Paternal Grandmother    Asthma Paternal Grandmother    Heart disease Paternal Grandmother    Depression Paternal Grandmother    Hypertension Paternal Grandmother    Hyperlipidemia Paternal Grandmother    Other Neg Hx     SOCIAL HISTORY: Social History   Socioeconomic History   Marital status: Divorced    Spouse name: Kala Ambriz   Number of children: 2   Years of education: Not on file   Highest  education level: Some college, no degree  Occupational History   Not on file  Tobacco Use   Smoking status: Every Day    Current packs/day: 0.25    Average packs/day: 0.3 packs/day for 5.0 years (1.3 ttl pk-yrs)    Types: Cigarettes   Smokeless tobacco: Never   Tobacco comments:    2 cig/day  Vaping Use   Vaping status: Never Used  Substance and Sexual Activity   Alcohol use: Yes    Comment: rarely   Drug use: No   Sexual activity: Yes    Birth control/protection: Inserts  Other Topics Concern   Not on file  Social History Narrative   Not on file   Social Drivers of Health   Financial Resource Strain: Low Risk  (06/20/2022)   Overall  Financial Resource Strain (CARDIA)    Difficulty of Paying Living Expenses: Not very hard  Food Insecurity: No Food Insecurity (06/20/2022)   Hunger Vital Sign    Worried About Running Out of Food in the Last Year: Never true    Ran Out of Food in the Last Year: Never true  Transportation Needs: No Transportation Needs (06/20/2022)   PRAPARE - Administrator, Civil Service (Medical): No    Lack of Transportation (Non-Medical): No  Physical Activity: Insufficiently Active (06/20/2022)   Exercise Vital Sign    Days of Exercise per Week: 4 days    Minutes of Exercise per Session: 30 min  Stress: Stress Concern Present (06/20/2022)   [employer] of Occupational Health - Occupational Stress Questionnaire    Feeling of Stress : To some extent  Social Connections: Socially Isolated (06/20/2022)   Social Connection and Isolation Panel [NHANES]    Frequency of Communication with Friends and Family: Once a week    Frequency of Social Gatherings with Friends and Family: Once a week    Attends Religious Services: Never    Database administrator or Organizations: No    Attends Banker Meetings: Never    Marital Status: Divorced  Catering manager Violence: Not At Risk (06/20/2022)   Humiliation, Afraid, Rape, and Kick  questionnaire    Fear of Current or Ex-Partner: No    Emotionally Abused: No    Physically Abused: No    Sexually Abused: No     PHYSICAL EXAM  GENERAL EXAM/CONSTITUTIONAL: Vitals:  There were no vitals filed for this visit.  There is no height or weight on file to calculate BMI. Wt Readings from Last 3 Encounters:  01/08/24 145 lb 9.6 oz (66 kg)  11/19/23 148 lb (67.1 kg)  09/05/23 150 lb (68 kg)   Patient is in no distress; well developed, nourished and groomed; neck is supple  CARDIOVASCULAR: Examination of carotid arteries is normal; no carotid bruits Regular rate and rhythm, no murmurs Examination of peripheral vascular system by observation and palpation is normal  EYES: Ophthalmoscopic exam of optic discs and posterior segments is normal; no papilledema or hemorrhages No results found.  MUSCULOSKELETAL: Gait, strength, tone, movements noted in Neurologic exam below  NEUROLOGIC: MENTAL STATUS:      No data to display         awake, alert, oriented to person, place and time recent and remote memory intact normal attention and concentration language fluent, comprehension intact, naming intact fund of knowledge appropriate  CRANIAL NERVE:  2nd - no papilledema on fundoscopic exam 2nd, 3rd, 4th, 6th - pupils equal and reactive to light, visual fields full to confrontation, extraocular muscles intact, no nystagmus 5th - facial sensation symmetric 7th - facial strength symmetric 8th - hearing intact 9th - palate elevates symmetrically, uvula midline 11th - shoulder shrug symmetric 12th - tongue protrusion midline  MOTOR:  normal bulk and tone, full strength in the BUE, BLE  SENSORY:  normal and symmetric to light touch, pinprick, temperature, vibration  COORDINATION:  finger-nose-finger, fine finger movements normal  REFLEXES:  deep tendon reflexes present and symmetric  GAIT/STATION:  narrow based gait     DIAGNOSTIC DATA (LABS, IMAGING,  TESTING) - I reviewed patient records, labs, notes, testing and imaging myself where available.  Lab Results  Component Value Date   WBC 8.6 09/05/2023   HGB 13.7 09/05/2023   HCT 40.6 09/05/2023   MCV 91.0 09/05/2023   PLT  432 (H) 09/05/2023      Component Value Date/Time   NA 138 09/05/2023 0805   K 3.5 09/05/2023 0805   CL 104 09/05/2023 0805   CO2 22 09/05/2023 0805   GLUCOSE 88 09/05/2023 0805   BUN 9 09/05/2023 0805   CREATININE 0.85 09/05/2023 0805   CALCIUM 9.2 09/05/2023 0805   PROT 7.9 04/02/2023 1223   ALBUMIN 4.6 04/02/2023 1223   AST 39 04/02/2023 1223   ALT 26 04/02/2023 1223   ALKPHOS 43 04/02/2023 1223   BILITOT 0.5 04/02/2023 1223   GFRNONAA >60 09/05/2023 0805   GFRAA >60 08/27/2020 1437   No results found for: "CHOL", "HDL", "LDLCALC", "LDLDIRECT", "TRIG", "CHOLHDL" Lab Results  Component Value Date   HGBA1C 5.0 09/25/2018   No results found for: "VITAMINB12" Lab Results  Component Value Date   TSH 126.044 (H) 04/02/2023     05/13/23 MRI brain (with and without) [I reviewed images myself and agree with interpretation. -VRP]  1.  No evidence of an acute intracranial abnormality. 2. 8 mm pineal cyst, unchanged from the prior brain MRI of 10/04/2021. 3. Otherwise unremarkable non-contrast MRI appearance of the brain. 4. Paranasal sinus disease as described. 5. Large left mastoid effusion.   ASSESSMENT AND PLAN  33 y.o. year old female here with:   Dx:  No diagnosis found.    PLAN:  MIGRAINE WITH AURA (2 per week)  MIGRAINE PREVENTION  LIFESTYLE CHANGES -Stop or avoid smoking -Decrease or avoid caffeine / alcohol -Eat and sleep on a regular schedule -Exercise several times per week - restart erenumab (Aimovig) 70mg  monthly (may increase to 140mg  monthly)   MIGRAINE RESCUE   - ibuprofen, tylenol as needed  - start rizatriptan (Maxalt) 10mg  as needed for breakthrough headache; may repeat x 1 after 2 hours; max 2 tabs per day  or 8 per month  Consider - ubrogepant Bernita Raisin) 100mg  as needed for breakthrough headache; may repeat x 1 after 2 hours; max 2 tabs per day or 8 per month - rimegepant (Nurtec) 75mg  as needed for breakthrough headache; max 8 per month   INSOMNIA / STRESS / ANXIETY - trial of amitriptyline 25mg  at bedtime - follow up with psychiatry / psychology (via PCP)  PINEAL CYST - incidental finding; stable from 2022 to 2024  HYPOTHYROIDISM - follow up with PCP / encdocrinology asap  No orders of the defined types were placed in this encounter.  No follow-ups on file.    I spent *** minutes of face-to-face and non-face-to-face time with patient.  This included previsit chart review, lab review, study review, order entry, electronic health record documentation, patient education and discussion regarding above diagnoses and treatment plan and answered all other questions to patient's satisfaction  Ihor Austin, [facility]  [facility] [address] [place], [place] [address]  Phone [phone] Fax [phone] Note: This document was prepared with digital dictation and possible smart phrase technology. Any transcriptional errors that result from this process are unintentional.

## 2024-03-03 DIAGNOSIS — F322 Major depressive disorder, single episode, severe without psychotic features: Secondary | ICD-10-CM | POA: Diagnosis not present

## 2024-03-03 DIAGNOSIS — Z6981 Encounter for mental health services for victim of other abuse: Secondary | ICD-10-CM | POA: Diagnosis not present

## 2024-03-03 DIAGNOSIS — F411 Generalized anxiety disorder: Secondary | ICD-10-CM | POA: Diagnosis not present

## 2024-03-03 DIAGNOSIS — F5104 Psychophysiologic insomnia: Secondary | ICD-10-CM | POA: Diagnosis not present

## 2024-03-11 ENCOUNTER — Ambulatory Visit
Admission: EM | Admit: 2024-03-11 | Discharge: 2024-03-11 | Disposition: A | Discharge status: Home / Self Care | Attending: Nurse Practitioner | Admitting: Nurse Practitioner

## 2024-03-11 DIAGNOSIS — J4541 Moderate persistent asthma with (acute) exacerbation: Secondary | ICD-10-CM | POA: Diagnosis not present

## 2024-03-11 DIAGNOSIS — Z8709 Personal history of other diseases of the respiratory system: Secondary | ICD-10-CM

## 2024-03-11 MED ORDER — METHYLPREDNISOLONE SODIUM SUCC 125 MG IJ SOLR
125.0000 mg | Freq: Once | INTRAMUSCULAR | Status: AC
  Administered 2024-03-11: 125 mg via INTRAMUSCULAR

## 2024-03-11 MED ORDER — ALBUTEROL SULFATE HFA 108 (90 BASE) MCG/ACT IN AERS: 2.0000 | INHALATION_SPRAY | Freq: Four times a day (QID) | RESPIRATORY_TRACT | 0 refills | Status: DC | PRN

## 2024-03-11 MED ORDER — PREDNISONE 20 MG PO TABS: 40.0000 mg | ORAL_TABLET | Freq: Every day | ORAL | 0 refills | Status: AC

## 2024-03-11 MED ORDER — IPRATROPIUM-ALBUTEROL 0.5-2.5 (3) MG/3ML IN SOLN
3.0000 mL | Freq: Once | RESPIRATORY_TRACT | Status: AC
  Administered 2024-03-11: 3 mL via RESPIRATORY_TRACT

## 2024-03-11 MED ORDER — MONTELUKAST SODIUM 10 MG PO TABS: 10.0000 mg | ORAL_TABLET | Freq: Every day | ORAL | 0 refills | Status: DC

## 2024-03-11 MED ORDER — ALBUTEROL SULFATE (2.5 MG/3ML) 0.083% IN NEBU: 2.5000 mg | INHALATION_SOLUTION | Freq: Four times a day (QID) | RESPIRATORY_TRACT | 0 refills | Status: AC | PRN

## 2024-03-11 MED ORDER — PROMETHAZINE-DM 6.25-15 MG/5ML PO SYRP: 5.0000 mL | ORAL_SOLUTION | Freq: Four times a day (QID) | ORAL | 0 refills | Status: DC | PRN

## 2024-03-11 NOTE — ED Triage Notes (Signed)
 Per mom, pt has some wheezing, cough, and chest discomfort x 6 days

## 2024-03-11 NOTE — Discharge Instructions (Signed)
 Take medication as prescribed. Increase fluids and allow for plenty of rest. May take over-the-counter Tylenol or ibuprofen as needed for pain, fever, or general discomfort. Avoid asthma triggers.  Also recommend beginning a daily allergy medication to help with allergy symptoms. Recommend use of a humidifier in the bedroom at nighttime during sleep and sleeping elevated on pillows while cough symptoms persist. Go to the emergency department immediately for worsening wheezing, shortness of breath, difficulty breathing, or if you become unable to speak in a complete sentence. Follow-up with your PCP for reevaluation within the next 7 to 10 days. Follow-up as needed.

## 2024-03-11 NOTE — ED Provider Notes (Signed)
 [facility]    CSN: [id number] Arrival date & time: 03/11/24  1356      History   Chief Complaint No chief complaint on file.   HPI Peggy [name] is a 33 y.o. female.   The history is provided by the patient.   Patient presents for complaints of wheezing, cough, and chest discomfort for the past 6 days.  Patient reports underlying history of asthma.  States she has been using albuterol inhaler and nebulizer with minimal relief.  Denies fever, chills, headache, ear pain, abdominal pain, nausea, vomiting, diarrhea, or rash.  Patient denies any obvious known sick contacts.  Constant with underlying history of seasonal allergies.  She is still currently smoking.  Past Medical History:  Diagnosis Date   Abnormal Pap smear of cervix 06/22/2020   05/2020 pap LSIL negative HPV and GC/CHL  As per ASCCP guidelines repeat in 1 year, 5 year risk of CIN 3 + is 2%   Asthma    Gestational diabetes    Pyloric stenosis    Thyroid disease    Graves   Urticaria     Patient Active Problem List   Diagnosis Date Noted   Migraine with aura and without status migrainosus, not intractable 11/19/2023   Smoker 11/19/2023   General counseling for prescription of oral contraceptives 11/19/2023   Pregnancy test negative 11/19/2023   Encounter for initial prescription of contraceptive pills 11/19/2023   General counseling and advice on contraceptive management 11/19/2023   Encounter for surveillance of injectable contraceptive 06/20/2022   Encounter for well woman exam with routine gynecological exam 06/20/2022   Asthmatic bronchitis , chronic (HCC) 04/12/2022   Chest pain, musculoskeletal 04/12/2022   Not well controlled moderate persistent asthma 02/04/2022   Tobacco use 02/04/2022   Graves disease 02/08/2021   Vitamin D insufficiency 02/07/2021   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   Chronic urticaria 10/12/2020   Recurrent infections 10/12/2020   Seasonal and perennial  allergic rhinitis 10/12/2020   Mild persistent asthma, uncomplicated 10/12/2020   Abnormal Pap smear of cervix 06/22/2020   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/09/2020   Graves' disease 01/24/2020   Depression screening 05/25/2019   History of gestational diabetes 10/28/2018   Low vitamin D level 02/12/2017   Current smoker 07/25/2014    Past Surgical History:  Procedure Laterality Date   PYLOROMYOTOMY      OB History     Gravida  4   Para  4   Term  4   Preterm  0   AB  0   Living  4      SAB  0   IAB  0   Ectopic  0   Multiple  0   Live Births  4            Home Medications    Prior to Admission medications   Medication Sig Start Date End Date Taking? Authorizing Provider  AIMOVIG 70 MG/ML SOAJ Inject 70 mg into the skin every 30 (thirty) days. Patient not taking: Reported on 01/08/2024 08/25/23   Penumalli, Glenford Bayley, MD  albuterol (PROVENTIL) (2.5 MG/3ML) 0.083% nebulizer solution Take 3 mLs (2.5 mg total) by nebulization every 4 (four) hours as needed for wheezing or shortness of breath. Patient not taking: Reported on 01/08/2024 02/08/23   Particia Nearing, PA-C  albuterol (VENTOLIN HFA) 108 (90 Base) MCG/ACT inhaler Inhale 2 puffs into the lungs every 4 (four) hours as needed for wheezing or shortness of breath. Patient not  taking: Reported on 01/08/2024 02/08/23   Particia Nearing, PA-C  amitriptyline (ELAVIL) 25 MG tablet Take 1 tablet (25 mg total) by mouth at bedtime. Patient not taking: Reported on 01/08/2024 08/25/23   Penumalli, Glenford Bayley, MD  budesonide-formoterol [facility]) 160-4.5 MCG/ACT inhaler Inhale 2 puffs into the lungs 2 (two) times daily. Patient not taking: Reported on 11/19/2023 02/04/22   Hetty Blend, FNP  cetirizine (ZYRTEC) 10 MG tablet Take 1 tablet (10 mg total) by mouth daily. Patient not taking: Reported on 01/08/2024 04/11/22   Hetty Blend, FNP  Cholecalciferol (VITAMIN D3 PO) Take by mouth. Patient not  taking: Reported on 01/08/2024    [provider]  Drospirenone (SLYND) 4 MG TABS Take 1 tablet (4 mg total) by mouth daily. Patient not taking: Reported on 01/08/2024 11/19/23   Adline Potter, NP  famotidine (PEPCID) 20 MG tablet TAKE 1 TABLET BY MOUTH TWICE A DAY Patient not taking: Reported on 01/08/2024 04/11/22   Hetty Blend, FNP  fluticasone (FLONASE) 50 MCG/ACT nasal spray Place 2 sprays into both nostrils daily. 02/04/24   Leath-Warren, Sadie Haber, NP  levothyroxine (SYNTHROID) 150 MCG tablet Take 1 tablet (150 mcg total) by mouth daily before breakfast. 04/02/23   Idol, Raynelle Fanning, PA-C  montelukast (SINGULAIR) 10 MG tablet Take 1 tablet (10 mg total) by mouth at bedtime. Patient not taking: Reported on 11/19/2023 02/04/22   Hetty Blend, FNP  omeprazole (PRILOSEC) 40 MG capsule TAKE 1 CAPSULE (40 MG TOTAL) BY MOUTH DAILY. TAKE 30-60 MIN BEFORE FIRST MEAL OF THE DAY Patient not taking: Reported on 01/08/2024 07/17/22   Nyoka Cowden, MD  ondansetron (ZOFRAN-ODT) 4 MG disintegrating tablet Take 1 tablet (4 mg total) by mouth every 8 (eight) hours as needed for nausea or vomiting. Patient not taking: Reported on 01/08/2024 03/12/23   Particia Nearing, PA-C  promethazine-dextromethorphan (PROMETHAZINE-DM) 6.25-15 MG/5ML syrup Take 5 mLs by mouth 4 (four) times daily as needed for cough. 02/04/24   Leath-Warren, Sadie Haber, NP  rizatriptan (MAXALT-MLT) 10 MG disintegrating tablet Take 1 tablet (10 mg total) by mouth as needed for migraine. May repeat in 2 hours if needed Patient not taking: Reported on 01/08/2024 08/25/23   Penumalli, Glenford Bayley, MD  VENTOLIN HFA 108 (90 Base) MCG/ACT inhaler TAKE 2 PUFFS BY MOUTH EVERY 6 HOURS AS NEEDED FOR WHEEZE OR SHORTNESS OF BREATH Patient not taking: Reported on 01/08/2024 05/23/23   Ambs, Norvel Richards, FNP    Family History Family History  Problem Relation Age of Onset   Arthritis Mother    Cancer Mother        thyroid   Depression Mother    Hyperlipidemia  Father    Asthma Father    Hypertension Father    Heart disease Father    COPD Maternal Grandfather    Diabetes Paternal Grandmother    Arthritis Paternal Grandmother    Asthma Paternal Grandmother    Heart disease Paternal Grandmother    Depression Paternal Grandmother    Hypertension Paternal Grandmother    Hyperlipidemia Paternal Grandmother    Other Neg Hx     Social History Social History   Tobacco Use   Smoking status: Every Day    Current packs/day: 0.25    Average packs/day: 0.3 packs/day for 5.0 years (1.3 ttl pk-yrs)    Types: Cigarettes   Smokeless tobacco: Never   Tobacco comments:    2 cig/day  Vaping Use   Vaping status: Never Used  Substance Use Topics   Alcohol use: Yes    Comment: rarely   Drug use: No     Allergies   Metronidazole   Review of Systems Review of Systems Per HPI  Physical Exam Triage Vital Signs ED Triage Vitals  Encounter Vitals Group     BP 03/11/24 1513 133/82     Systolic BP Percentile --      Diastolic BP Percentile --      Pulse Rate 03/11/24 1513 84     Resp 03/11/24 1513 18     Temp 03/11/24 1513 98.6 F (37 C)     Temp Source 03/11/24 1513 Oral     SpO2 03/11/24 1513 97 %     Weight --      Height --      Head Circumference --      Peak Flow --      Pain Score 03/11/24 1512 4     Pain Loc --      Pain Education --      Exclude from Growth Chart --    No data found.  Updated Vital Signs BP 133/82 (BP Location: Right Arm)   Pulse 84   Temp 98.6 F (37 C) (Oral)   Resp 18   LMP 03/08/2024   SpO2 97%   Visual Acuity Right Eye Distance:   Left Eye Distance:   Bilateral Distance:    Right Eye Near:   Left Eye Near:    Bilateral Near:     Physical Exam Vitals and nursing note reviewed.  Constitutional:      General: She is not in acute distress.    Appearance: Normal appearance.  HENT:     Head: Normocephalic.     Right Ear: Tympanic membrane, ear canal and external ear normal.     Left Ear:  Tympanic membrane, ear canal and external ear normal.     Mouth/Throat:     Mouth: Mucous membranes are moist.  Eyes:     Extraocular Movements: Extraocular movements intact.     Conjunctiva/sclera: Conjunctivae normal.     Pupils: Pupils are equal, round, and reactive to light.  Cardiovascular:     Rate and Rhythm: Normal rate and regular rhythm.     Pulses: Normal pulses.     Heart sounds: Normal heart sounds.  Pulmonary:     Effort: Pulmonary effort is normal. No respiratory distress.     Breath sounds: Decreased air movement present. No stridor. Examination of the right-upper field reveals wheezing. Examination of the left-upper field reveals wheezing. Examination of the right-lower field reveals wheezing. Examination of the left-lower field reveals wheezing. Wheezing (Inspiratory and expiratory wheezing noted throughout) present. No rhonchi or rales.  Abdominal:     General: Bowel sounds are normal.     Palpations: Abdomen is soft.     Tenderness: There is no abdominal tenderness.  Musculoskeletal:     Cervical back: Normal range of motion.  Lymphadenopathy:     Cervical: No cervical adenopathy.  Skin:    General: Skin is warm and dry.  Neurological:     General: No focal deficit present.     Mental Status: She is alert and oriented to person, place, and time.  Psychiatric:        Mood and Affect: Mood normal.        Behavior: Behavior normal.      UC Treatments / Results  Labs (all labs ordered are listed, but only abnormal results are  displayed) Labs Reviewed - No data to display  EKG   Radiology No results found.  Procedures Procedures (including critical care time)  Medications Ordered in UC Medications  ipratropium-albuterol (DUONEB) 0.5-2.5 (3) MG/3ML nebulizer solution 3 mL (3 mLs Nebulization Given 03/11/24 1558)  methylPREDNISolone sodium succinate (SOLU-MEDROL) 125 mg/2 mL injection 125 mg (125 mg Intramuscular Given 03/11/24 1558)    Initial  Impression / Assessment and Plan / UC Course  I have reviewed the triage vital signs and the nursing notes.  Pertinent labs & imaging results that were available during my care of the patient were reviewed by me and considered in my medical decision making (see chart for details).  Symptoms consistent with asthma exacerbation.  Patient was administered Solu-Medrol 125 mg IM and given a DuoNeb.  Improvement in lung sounds noted throughout.  Will start patient on prednisone 40 mg for asthma exacerbation, albuterol nebulizer and albuterol inhaler for home treatment of wheezing, shortness of breath, or trouble breathing, Promethazine DM for the cough, and Singulair 10 mg for asthma and allergy control.  Supportive care recommendations were provided and discussed with the patient to include fluids, rest, and avoiding allergy triggers.  Patient was given strict ER follow-up precautions.  Patient was in agreement with this plan of care and verbalizes understanding.  All questions were answered.  Patient stable for discharge.  Work note was provided.  Final Clinical Impressions(s) / UC Diagnoses   Final diagnoses:  None   Discharge Instructions   None    ED Prescriptions   None    PDMP not reviewed this encounter.   Abran Cantor, NP 03/11/24 [phone]

## 2024-03-26 DIAGNOSIS — G43009 Migraine without aura, not intractable, without status migrainosus: Secondary | ICD-10-CM | POA: Diagnosis not present

## 2024-03-26 DIAGNOSIS — F411 Generalized anxiety disorder: Secondary | ICD-10-CM | POA: Diagnosis not present

## 2024-03-26 DIAGNOSIS — E039 Hypothyroidism, unspecified: Secondary | ICD-10-CM | POA: Diagnosis not present

## 2024-03-26 DIAGNOSIS — G4700 Insomnia, unspecified: Secondary | ICD-10-CM | POA: Diagnosis not present

## 2024-03-26 DIAGNOSIS — Z6827 Body mass index (BMI) 27.0-27.9, adult: Secondary | ICD-10-CM | POA: Diagnosis not present

## 2024-04-06 ENCOUNTER — Ambulatory Visit: Admitting: Adult Health

## 2024-04-13 DIAGNOSIS — Z6981 Encounter for mental health services for victim of other abuse: Secondary | ICD-10-CM | POA: Diagnosis not present

## 2024-04-13 DIAGNOSIS — F322 Major depressive disorder, single episode, severe without psychotic features: Secondary | ICD-10-CM | POA: Diagnosis not present

## 2024-04-13 DIAGNOSIS — F411 Generalized anxiety disorder: Secondary | ICD-10-CM | POA: Diagnosis not present

## 2024-04-13 DIAGNOSIS — F5104 Psychophysiologic insomnia: Secondary | ICD-10-CM | POA: Diagnosis not present

## 2024-04-20 ENCOUNTER — Other Ambulatory Visit (HOSPITAL_COMMUNITY)
Admission: RE | Admit: 2024-04-20 | Discharge: 2024-04-20 | Disposition: A | Discharge status: Home / Self Care | Source: Ambulatory Visit | Attending: Obstetrics and Gynecology | Admitting: Obstetrics and Gynecology

## 2024-04-20 ENCOUNTER — Ambulatory Visit: Admitting: Obstetrics and Gynecology

## 2024-04-20 ENCOUNTER — Encounter: Payer: Self-pay | Admitting: Obstetrics and Gynecology

## 2024-04-20 VITALS — BP 124/78 | HR 90 | Ht 60.0 in | Wt 145.0 lb

## 2024-04-20 DIAGNOSIS — Z01419 Encounter for gynecological examination (general) (routine) without abnormal findings: Secondary | ICD-10-CM

## 2024-04-20 DIAGNOSIS — N926 Irregular menstruation, unspecified: Secondary | ICD-10-CM | POA: Diagnosis not present

## 2024-04-20 DIAGNOSIS — Z124 Encounter for screening for malignant neoplasm of cervix: Secondary | ICD-10-CM

## 2024-04-20 DIAGNOSIS — Z113 Encounter for screening for infections with a predominantly sexual mode of transmission: Secondary | ICD-10-CM | POA: Diagnosis not present

## 2024-04-20 NOTE — Progress Notes (Signed)
 ANNUAL EXAM Patient name: Peggy [name] MRN [id number]  Date of birth: 03-May-1991 Chief Complaint:   annual  History of Present Illness:   Peggy [name] is a 33 y.o. [phone]  female being seen today for a routine annual exam.  Current complaints: past two months having period two times in one month. Reports she will have period one-two week apart, last period she reports was heavy with clots all 5 days.Endorses her periods typically last about 4-5 days. Usually her cycles are once month 4-5 days. Not sexually active. Not on birth control. Has not changed any medicines, no new partner, denies vaginal symptoms or pelvic pain.  Patient's last menstrual period was 04/16/2024.   Upstream - 04/20/24 1139       Pregnancy Intention Screening   Does the patient want to become pregnant in the next year? No    Does the patient's partner want to become pregnant in the next year? No    Would the patient like to discuss contraceptive options today? No      Contraception Wrap Up   Current Method No Contraceptive Precautions    End Method No Contraception Precautions    Contraception Counseling Provided No            The pregnancy intention screening data noted above was reviewed. Potential methods of contraception were discussed. The patient elected to proceed with No Contraception Precautions.   Last pap 2022. Results were: NILM w/ HRHPV negative. H/O abnormal pap: yes Last mammogram: n/a. Results were: N/A. Family h/o breast cancer: no Last colonoscopy: n/a. Results were: N/A. Family h/o colorectal cancer: no     04/20/2024   11:53 AM 06/20/2022   11:44 AM 06/18/2021    3:44 PM 06/16/2020   12:40 PM 10/21/2019    1:45 PM  Depression screen PHQ 2/9  Decreased Interest 2 0 1 1 0  Down, Depressed, Hopeless 2 1 1  0 0  PHQ - 2 Score 4 1 2 1  0  Altered sleeping 2 2 1 1    Tired, decreased energy 2 1 1 1    Change in appetite 2 1 1 1    Feeling bad or failure about yourself  2 0 1  0   Trouble concentrating 2 1 1 1    Moving slowly or fidgety/restless 0 0 1 0   Suicidal thoughts 0 0 0 0   PHQ-9 Score 14 6 8 5    Difficult doing work/chores    Not difficult at all         04/20/2024   11:54 AM 06/20/2022   11:44 AM 06/18/2021    3:44 PM 06/16/2020   12:40 PM  GAD 7 : Generalized Anxiety Score  Nervous, Anxious, on Edge 2 1 1 1   Control/stop worrying 2 0 1 0  Worry too much - different things 2 0 1 0  Trouble relaxing 2 0 1 0  Restless 2 0 1 0  Easily annoyed or irritable 2 1 1 1   Afraid - awful might happen 2 0 0 0  Total GAD 7 Score 14 2 6 2   Anxiety Difficulty    Not difficult at all     Review of Systems:   Pertinent items are noted in HPI Denies any headaches, blurred vision, fatigue, shortness of breath, chest pain, abdominal pain, abnormal vaginal discharge/itching/odor/irritation, problems with periods, bowel movements, urination, or intercourse unless otherwise stated above. Pertinent History Reviewed:  Reviewed past medical,surgical, social and family history.  Reviewed problem list,  medications and allergies. Physical Assessment:   Vitals:   04/20/24 1131  BP: 124/78  Pulse: 90  Weight: 145 lb (65.8 kg)  Height: 5' (1.524 m)  Body mass index is 28.32 kg/m.        Physical Examination:   General appearance - well appearing, and in no distress  Mental status - alert, oriented   Psych:  She has a normal mood and affect  Skin - warm and dry, normal color  Chest - effort normal, all lung fields clear to auscultation bilaterally  Heart - normal rate and regular rhythm  Neck:  midline trachea, no thyromegaly or nodules  Breasts - breasts appear normal, no suspicious masses, no skin or nipple changes or  axillary nodes  Abdomen - soft, nontender, nondistended, no masses or organomegaly  Pelvic - VULVA: normal appearing vulva with no masses, tenderness or lesions  VAGINA: normal appearing vagina with normal color and discharge, no lesions   CERVIX: normal appearing cervix without discharge or lesions, no CMT  Thin prep pap is done w HR HPV cotesting  UTERUS: uterus is felt to be normal size, shape, consistency and nontender   ADNEXA: No adnexal masses or tenderness noted.  Extremities:  No swelling or varicosities noted  Chaperone present for exam  No results found for this or any previous visit (from the past 24 hours).  Assessment & Plan:   1. Encounter for annual routine gynecological examination (Primary) Encouraged self breast exam Continue pcp care - Cervicovaginal ancillary only - Cytology - PAP( [place])  2. Cervical cancer screening Pap collected today - Cytology - PAP( [place])  3. Irregular bleeding Discussed differentials to cause as well as treatment if continues  Checking cv swab today, if continue consider u/s Given hx migraine aura, smoking, progestin contraceptive.Does not desire BC at this time   4. Screening for STDs (sexually transmitted diseases)  - Cervicovaginal ancillary only   Labs/procedures today:   Mammogram: @ 31yo, or sooner if problems Colonoscopy: @ 31yo, or sooner if problems  No orders of the defined types were placed in this encounter.   Meds: No orders of the defined types were placed in this encounter.   Follow-up: Return in about 1 year (around 04/20/2025).  Susi Eric, FNP

## 2024-04-21 LAB — CERVICOVAGINAL ANCILLARY ONLY
Bacterial Vaginitis (gardnerella): NEGATIVE
Candida Glabrata: NEGATIVE
Candida Vaginitis: NEGATIVE
Chlamydia: NEGATIVE
Comment: NEGATIVE
Comment: NEGATIVE
Comment: NEGATIVE
Comment: NEGATIVE
Comment: NEGATIVE
Comment: NORMAL
Neisseria Gonorrhea: NEGATIVE
Trichomonas: NEGATIVE

## 2024-04-23 LAB — CYTOLOGY - PAP
Comment: NEGATIVE
Diagnosis: NEGATIVE
High risk HPV: NEGATIVE

## 2024-05-07 DIAGNOSIS — F322 Major depressive disorder, single episode, severe without psychotic features: Secondary | ICD-10-CM | POA: Diagnosis not present

## 2024-05-07 DIAGNOSIS — F411 Generalized anxiety disorder: Secondary | ICD-10-CM | POA: Diagnosis not present

## 2024-05-07 DIAGNOSIS — Z6827 Body mass index (BMI) 27.0-27.9, adult: Secondary | ICD-10-CM | POA: Diagnosis not present

## 2024-05-07 DIAGNOSIS — E039 Hypothyroidism, unspecified: Secondary | ICD-10-CM | POA: Diagnosis not present

## 2024-07-19 ENCOUNTER — Ambulatory Visit
Admission: EM | Admit: 2024-07-19 | Discharge: 2024-07-19 | Disposition: A | Discharge status: Home / Self Care | Attending: Family Medicine | Admitting: Family Medicine

## 2024-07-19 DIAGNOSIS — J4521 Mild intermittent asthma with (acute) exacerbation: Secondary | ICD-10-CM | POA: Insufficient documentation

## 2024-07-19 DIAGNOSIS — J029 Acute pharyngitis, unspecified: Secondary | ICD-10-CM | POA: Insufficient documentation

## 2024-07-19 LAB — POCT RAPID STREP A (OFFICE): Rapid Strep A Screen: NEGATIVE

## 2024-07-19 MED ORDER — LIDOCAINE VISCOUS HCL 2 % MT SOLN: 10.0000 mL | OROMUCOSAL | 0 refills | Status: DC | PRN

## 2024-07-19 MED ORDER — ALBUTEROL SULFATE HFA 108 (90 BASE) MCG/ACT IN AERS: 2.0000 | INHALATION_SPRAY | RESPIRATORY_TRACT | 0 refills | Status: AC | PRN

## 2024-07-19 NOTE — ED Triage Notes (Signed)
 Pt reports sore throat intermittent x 4 days

## 2024-07-19 NOTE — ED Provider Notes (Signed)
 [facility]    CSN: [id number] Arrival date & time: 07/19/24  1449      History   Chief Complaint No chief complaint on file.   HPI Peggy [name] is a 33 y.o. female.   Patient presenting today with 4-day history of sore throat.  Denies congestion, cough, chest pain, abdominal pain, vomiting, diarrhea.  So far trying Tylenol  with minimal relief.  Also has a history of asthma on albuterol  and maintenance inhaler regimen.     Past Medical History:  Diagnosis Date   Abnormal Pap smear of cervix 06/22/2020   05/2020 pap LSIL negative HPV and GC/CHL  As per ASCCP guidelines repeat in 1 year, 5 year risk of CIN 3 + is 2%   Asthma    Gestational diabetes    Pyloric stenosis    Thyroid  disease    Graves   Urticaria     Patient Active Problem List   Diagnosis Date Noted   Migraine with aura and without status migrainosus, not intractable 11/19/2023   Smoker 11/19/2023   General counseling for prescription of oral contraceptives 11/19/2023   Pregnancy test negative 11/19/2023   Encounter for initial prescription of contraceptive pills 11/19/2023   General counseling and advice on contraceptive management 11/19/2023   Encounter for surveillance of injectable contraceptive 06/20/2022   Encounter for well woman exam with routine gynecological exam 06/20/2022   Asthmatic bronchitis , chronic (HCC) 04/12/2022   Chest pain, musculoskeletal 04/12/2022   Not well controlled moderate persistent asthma 02/04/2022   Tobacco use 02/04/2022   Graves disease 02/08/2021   Vitamin D  insufficiency 02/07/2021   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   Chronic urticaria 10/12/2020   Recurrent infections 10/12/2020   Seasonal and perennial allergic rhinitis 10/12/2020   Mild persistent asthma, uncomplicated 10/12/2020   Abnormal Pap smear of cervix 06/22/2020   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/09/2020   Graves' disease 01/24/2020   Depression screening  05/25/2019   History of gestational diabetes 10/28/2018   Low vitamin D  level 02/12/2017   Current smoker 07/25/2014    Past Surgical History:  Procedure Laterality Date   PYLOROMYOTOMY      OB History     Gravida  4   Para  4   Term  4   Preterm  0   AB  0   Living  4      SAB  0   IAB  0   Ectopic  0   Multiple  0   Live Births  4            Home Medications    Prior to Admission medications   Medication Sig Start Date End Date Taking? Authorizing Provider  lidocaine  (XYLOCAINE ) 2 % solution Use as directed 10 mLs in the mouth or throat every 3 (three) hours as needed. 07/19/24  Yes Stuart Vernell Norris, PA-C  AIMOVIG  70 MG/ML SOAJ Inject 70 mg into the skin every 30 (thirty) days. Patient not taking: Reported on 04/20/2024 08/25/23   Penumalli, Eduard SAUNDERS, MD  albuterol  (PROVENTIL ) (2.5 MG/3ML) 0.083% nebulizer solution Take 3 mLs (2.5 mg total) by nebulization every 6 (six) hours as needed for wheezing or shortness of breath. 03/11/24   Leath-Warren, Etta PARAS, NP  albuterol  (VENTOLIN  HFA) 108 (90 Base) MCG/ACT inhaler Inhale 2 puffs into the lungs every 4 (four) hours as needed. 07/19/24   Stuart Vernell Norris, PA-C  amitriptyline  (ELAVIL ) 25 MG tablet Take 1 tablet (25 mg total) by mouth at  bedtime. Patient not taking: Reported on 11/19/2023 08/25/23   Penumalli, Vikram R, MD  budesonide -formoterol  (SYMBICORT ) 160-4.5 MCG/ACT inhaler Inhale 2 puffs into the lungs 2 (two) times daily. Patient not taking: Reported on 11/19/2023 02/04/22   Cari Arlean HERO, FNP  cetirizine  (ZYRTEC ) 10 MG tablet Take 1 tablet (10 mg total) by mouth daily. 04/11/22   Cari Arlean HERO, FNP  Cholecalciferol (VITAMIN D3 PO) Take by mouth. Patient not taking: Reported on 01/08/2024    [provider]  Drospirenone  (SLYND ) 4 MG TABS Take 1 tablet (4 mg total) by mouth daily. Patient not taking: Reported on 04/20/2024 11/19/23   Signa Delon LABOR, NP  famotidine  (PEPCID ) 20 MG tablet  TAKE 1 TABLET BY MOUTH TWICE A DAY Patient not taking: Reported on 04/20/2024 04/11/22   Cari Arlean HERO, FNP  fluticasone  (FLONASE ) 50 MCG/ACT nasal spray Place 2 sprays into both nostrils daily. Patient not taking: Reported on 04/20/2024 02/04/24   Leath-Warren, Etta PARAS, NP  levothyroxine  (SYNTHROID ) 150 MCG tablet Take 1 tablet (150 mcg total) by mouth daily before breakfast. 04/02/23   Idol, Julie, PA-C  montelukast  (SINGULAIR ) 10 MG tablet Take 1 tablet (10 mg total) by mouth at bedtime. Patient not taking: Reported on 04/20/2024 03/11/24   Leath-Warren, Etta PARAS, NP  omeprazole  (PRILOSEC) 40 MG capsule TAKE 1 CAPSULE (40 MG TOTAL) BY MOUTH DAILY. TAKE 30-60 MIN BEFORE FIRST MEAL OF THE DAY Patient not taking: Reported on 01/08/2024 07/17/22   Darlean Ozell NOVAK, MD  ondansetron  (ZOFRAN -ODT) 4 MG disintegrating tablet Take 1 tablet (4 mg total) by mouth every 8 (eight) hours as needed for nausea or vomiting. Patient not taking: Reported on 11/19/2023 03/12/23   Stuart Vernell Norris, PA-C  promethazine -dextromethorphan  (PROMETHAZINE -DM) 6.25-15 MG/5ML syrup Take 5 mLs by mouth 4 (four) times daily as needed. Patient not taking: Reported on 04/20/2024 03/11/24   Leath-Warren, Etta PARAS, NP  rizatriptan  (MAXALT -MLT) 10 MG disintegrating tablet Take 1 tablet (10 mg total) by mouth as needed for migraine. May repeat in 2 hours if needed 08/25/23   Penumalli, Eduard SAUNDERS, MD    Family History Family History  Problem Relation Age of Onset   Arthritis Mother    Cancer Mother        thyroid    Depression Mother    Hyperlipidemia Father    Asthma Father    Hypertension Father    Heart disease Father    COPD Maternal Grandfather    Diabetes Paternal Grandmother    Arthritis Paternal Grandmother    Asthma Paternal Grandmother    Heart disease Paternal Grandmother    Depression Paternal Grandmother    Hypertension Paternal Grandmother    Hyperlipidemia Paternal Grandmother    Other Neg Hx     Social  History Social History   Tobacco Use   Smoking status: Every Day    Current packs/day: 0.25    Average packs/day: 0.3 packs/day for 5.0 years (1.3 ttl pk-yrs)    Types: Cigarettes   Smokeless tobacco: Never   Tobacco comments:    2 cig/day  Vaping Use   Vaping status: Never Used  Substance Use Topics   Alcohol use: Yes    Comment: rarely   Drug use: No     Allergies   Metronidazole    Review of Systems Review of Systems Per HPI  Physical Exam Triage Vital Signs ED Triage Vitals  Encounter Vitals Group     BP 07/19/24 1509 124/77     Girls Systolic BP Percentile --  Girls Diastolic BP Percentile --      Boys Systolic BP Percentile --      Boys Diastolic BP Percentile --      Pulse Rate 07/19/24 1509 87     Resp 07/19/24 1509 14     Temp 07/19/24 1509 99.1 F (37.3 C)     Temp Source 07/19/24 1509 Oral     SpO2 07/19/24 1509 98 %     Weight --      Height --      Head Circumference --      Peak Flow --      Pain Score 07/19/24 1507 6     Pain Loc --      Pain Education --      Exclude from Growth Chart --    No data found.  Updated Vital Signs BP 124/77 (BP Location: Right Arm)   Pulse 87   Temp 99.1 F (37.3 C) (Oral)   Resp 14   SpO2 98%   Visual Acuity Right Eye Distance:   Left Eye Distance:   Bilateral Distance:    Right Eye Near:   Left Eye Near:    Bilateral Near:     Physical Exam Vitals and nursing note reviewed.  Constitutional:      Appearance: Normal appearance. She is not ill-appearing.  HENT:     Head: Atraumatic.     Nose: Nose normal.     Mouth/Throat:     Mouth: Mucous membranes are moist.     Pharynx: Oropharynx is clear. Posterior oropharyngeal erythema present. No oropharyngeal exudate.  Eyes:     Extraocular Movements: Extraocular movements intact.     Conjunctiva/sclera: Conjunctivae normal.  Cardiovascular:     Rate and Rhythm: Normal rate and regular rhythm.     Heart sounds: Normal heart sounds.   Pulmonary:     Effort: Pulmonary effort is normal.     Breath sounds: Wheezing present.  Musculoskeletal:        General: Normal range of motion.     Cervical back: Normal range of motion and neck supple.  Lymphadenopathy:     Cervical: No cervical adenopathy.  Skin:    General: Skin is warm and dry.  Neurological:     Mental Status: She is alert and oriented to person, place, and time.     Motor: No weakness.     Gait: Gait normal.  Psychiatric:        Mood and Affect: Mood normal.        Thought Content: Thought content normal.        Judgment: Judgment normal.      UC Treatments / Results  Labs (all labs ordered are listed, but only abnormal results are displayed) Labs Reviewed  CULTURE, GROUP A STREP [facility])  POCT RAPID STREP A (OFFICE)    EKG   Radiology No results found.  Procedures Procedures (including critical care time)  Medications Ordered in UC Medications - No data to display  Initial Impression / Assessment and Plan / UC Course  I have reviewed the triage vital signs and the nursing notes.  Pertinent labs & imaging results that were available during my care of the patient were reviewed by me and considered in my medical decision making (see chart for details).     Vitals and exam overall reassuring, possibly seasonal allergy related sore throat.  Rapid strep negative, throat culture pending for further evaluation.  Does have some wheezing today, will refill albuterol   for as needed use and continue maintenance inhaler regimen.  Discussed allergy regimen, viscous lidocaine  for sore throat.  Return for worsening symptoms.  Final Clinical Impressions(s) / UC Diagnoses   Final diagnoses:  Sore throat  Mild intermittent asthma with acute exacerbation   Discharge Instructions   None    ED Prescriptions     Medication Sig Dispense Auth. Provider   albuterol  (VENTOLIN  HFA) 108 (90 Base) MCG/ACT inhaler Inhale 2 puffs into the lungs every 4 (four)  hours as needed. 18 g Stuart Vernell Norris, PA-C   lidocaine  (XYLOCAINE ) 2 % solution Use as directed 10 mLs in the mouth or throat every 3 (three) hours as needed. 100 mL Stuart Vernell Norris, [place]      PDMP not reviewed this encounter.   Stuart Vernell Norris, PA-C 07/19/24 1546

## 2024-07-22 ENCOUNTER — Ambulatory Visit (HOSPITAL_COMMUNITY): Payer: Self-pay

## 2024-07-22 LAB — CULTURE, GROUP A STREP (THRC)

## 2024-07-28 ENCOUNTER — Other Ambulatory Visit: Payer: Self-pay

## 2024-07-28 ENCOUNTER — Ambulatory Visit (INDEPENDENT_AMBULATORY_CARE_PROVIDER_SITE_OTHER): Admitting: Allergy & Immunology

## 2024-07-28 ENCOUNTER — Encounter: Payer: Self-pay | Admitting: Allergy & Immunology

## 2024-07-28 VITALS — BP 120/72 | HR 94 | Temp 97.2°F | Resp 18 | Ht 59.45 in | Wt 134.2 lb

## 2024-07-28 DIAGNOSIS — J302 Other seasonal allergic rhinitis: Secondary | ICD-10-CM

## 2024-07-28 DIAGNOSIS — J4530 Mild persistent asthma, uncomplicated: Secondary | ICD-10-CM | POA: Diagnosis not present

## 2024-07-28 DIAGNOSIS — B999 Unspecified infectious disease: Secondary | ICD-10-CM

## 2024-07-28 DIAGNOSIS — L508 Other urticaria: Secondary | ICD-10-CM

## 2024-07-28 DIAGNOSIS — J3089 Other allergic rhinitis: Secondary | ICD-10-CM

## 2024-07-28 DIAGNOSIS — J4531 Mild persistent asthma with (acute) exacerbation: Secondary | ICD-10-CM

## 2024-07-28 MED ORDER — BUDESONIDE-FORMOTEROL FUMARATE 160-4.5 MCG/ACT IN AERO
2.0000 | INHALATION_SPRAY | Freq: Two times a day (BID) | RESPIRATORY_TRACT | 5 refills | Status: DC
Start: 2024-07-28 — End: 2024-10-01

## 2024-07-28 MED ORDER — LEVOCETIRIZINE DIHYDROCHLORIDE 5 MG PO TABS
5.0000 mg | ORAL_TABLET | Freq: Two times a day (BID) | ORAL | 1 refills | Status: AC
Start: 2024-07-28 — End: ?

## 2024-07-28 MED ORDER — METHYLPREDNISOLONE ACETATE 80 MG/ML IJ SUSP
80.0000 mg | Freq: Once | INTRAMUSCULAR | Status: AC
  Administered 2024-07-28: 80 mg via INTRAMUSCULAR

## 2024-07-28 NOTE — Patient Instructions (Addendum)
 1. Chronic urticaria - These seem to be well-controlled at this point.   2. Seasonal and perennial allergic rhinitis - Testing at the last visit showed: grasses, trees, indoor molds, dust mites, cat, dog and cockroach - Definitely consider checking for mold in the home.  - Stop the Benadryl .  - Start taking: Xyzal  (levocetirizine) 5mg  up to TWICE DAILY on bad days - You can use an extra dose of the antihistamine, if needed, for breakthrough symptoms.  - Consider nasal saline rinses 1-2 times daily to remove allergens from the nasal cavities as well as help with mucous clearance (this is especially helpful to do before the nasal sprays are given) - Consider allergy shots as a means of long-term control. - Allergy shots re-train and reset the immune system to ignore environmental allergens and decrease the resulting immune response to those allergens (sneezing, itchy watery eyes, runny nose, nasal congestion, etc).    - Allergy shots improve symptoms in 75-85% of patients.   3. Recurrent infections - This seems to be well-controlled at this point as well.   4. Mild persistent asthma, uncomplicated - Lung testing looked fairly good today despite how you sounded. - DepoMedrol injection given today.  - Start the prednisone  taper that I sent in: Take two tablets (20mg ) twice daily for three days, then one tablet (10mg ) twice daily for three days, then STOP. - We definitely want you back on the Symbicort .  - Spacer use reviewed. - Daily controller medication(s): Symbicort  160mcg two puffs twice daily with spacer - Prior to physical activity: albuterol  2 puffs 10-15 minutes before physical activity. - Rescue medications: albuterol  4 puffs every 4-6 hours as needed - Changes during respiratory infections or worsening symptoms: Increase Flovent  to 2 puffs twice daily for TWO WEEKS. - Asthma control goals:  * Full participation in all desired activities (may need albuterol  before activity) *  Albuterol  use two time or less a week on average (not counting use with activity) * Cough interfering with sleep two time or less a month * Oral steroids no more than once a year * No hospitalizations  5. Return in about 3 months (around 10/28/2024). You can have the follow up appointment with Dr. Iva or a Nurse Practicioner (our Nurse Practitioners are excellent and always have Physician oversight!).    Please inform us  of any Emergency Department visits, hospitalizations, or changes in symptoms. Call us  before going to the ED for breathing or allergy symptoms since we might be able to fit you in for a sick visit. Feel free to contact us  anytime with any questions, problems, or concerns.  It was a pleasure to see you and your family again today!  Websites that have reliable patient information: 1. American Academy of Asthma, Allergy, and Immunology: www.aaaai.org 2. Food Allergy Research and Education (FARE): foodallergy.org 3. Mothers of Asthmatics: http://www.asthmacommunitynetwork.org 4. American College of Allergy, Asthma, and Immunology: www.acaai.org      "Like" us  on Facebook and Instagram for our latest updates!      A healthy democracy works best when [employer] participate! Make sure you are registered to vote! If you have moved or changed any of your contact information, you will need to get this updated before voting! Scan the QR codes below to learn more!

## 2024-07-28 NOTE — Progress Notes (Unsigned)
 FOLLOW UP  Date of Service/Encounter:  07/28/24   Assessment:   Chronic idiopathic urticaria   Seasonal and perennial allergic rhinitis   Recurrent infections   Mild persistent asthma, uncomplicated  Plan/Recommendations:   Patient Instructions  1. Chronic urticaria - These seem to be well-controlled at this point.   2. Seasonal and perennial allergic rhinitis - Testing at the last visit showed: grasses, trees, indoor molds, dust mites, cat, dog and cockroach - Definitely consider checking for mold in the home.  - Stop the Benadryl .  - Start taking: Xyzal  (levocetirizine) 5mg  up to TWICE DAILY on bad days - You can use an extra dose of the antihistamine, if needed, for breakthrough symptoms.  - Consider nasal saline rinses 1-2 times daily to remove allergens from the nasal cavities as well as help with mucous clearance (this is especially helpful to do before the nasal sprays are given) - Consider allergy shots as a means of long-term control. - Allergy shots re-train and reset the immune system to ignore environmental allergens and decrease the resulting immune response to those allergens (sneezing, itchy watery eyes, runny nose, nasal congestion, etc).    - Allergy shots improve symptoms in 75-85% of patients.   3. Recurrent infections - This seems to be well-controlled at this point as well.   4. Mild persistent asthma, uncomplicated - Lung testing looked fairly good today despite how you sounded. - DepoMedrol injection given today.  - Start the prednisone  taper that I sent in: Take two tablets (20mg ) twice daily for three days, then one tablet (10mg ) twice daily for three days, then STOP. - We definitely want you back on the Symbicort .  - Spacer use reviewed. - Daily controller medication(s): Symbicort  160mcg two puffs twice daily with spacer - Prior to physical activity: albuterol  2 puffs 10-15 minutes before physical activity. - Rescue medications: albuterol  4  puffs every 4-6 hours as needed - Changes during respiratory infections or worsening symptoms: Increase Flovent  to 2 puffs twice daily for TWO WEEKS. - Asthma control goals:  * Full participation in all desired activities (may need albuterol  before activity) * Albuterol  use two time or less a week on average (not counting use with activity) * Cough interfering with sleep two time or less a month * Oral steroids no more than once a year * No hospitalizations  5. Return in about 3 months (around 10/28/2024). You can have the follow up appointment with Dr. Iva or a Nurse Practicioner (our Nurse Practitioners are excellent and always have Physician oversight!).    Please inform us  of any Emergency Department visits, hospitalizations, or changes in symptoms. Call us  before going to the ED for breathing or allergy symptoms since we might be able to fit you in for a sick visit. Feel free to contact us  anytime with any questions, problems, or concerns.  It was a pleasure to see you and your family again today!  Websites that have reliable patient information: 1. American Academy of Asthma, Allergy, and Immunology: www.aaaai.org 2. Food Allergy Research and Education (FARE): foodallergy.org 3. Mothers of Asthmatics: http://www.asthmacommunitynetwork.org 4. American College of Allergy, Asthma, and Immunology: www.acaai.org      "Like" us  on Facebook and Instagram for our latest updates!      A healthy democracy works best when [employer] participate! Make sure you are registered to vote! If you have moved or changed any of your contact information, you will need to get this updated before voting! Scan the QR codes  below to learn more!              Subjective:   Peggy [name] is a 33 y.o. female presenting today for follow up of  Chief Complaint  Patient presents with   Follow-up   Allergies    Has an sore throat on and off for about a month went to urgent care they  tested for strep which was negative but throat and nose was irritated.    Peggy [name] has a history of the following: Patient Active Problem List   Diagnosis Date Noted   Migraine with aura and without status migrainosus, not intractable 11/19/2023   Smoker 11/19/2023   General counseling for prescription of oral contraceptives 11/19/2023   Pregnancy test negative 11/19/2023   Encounter for initial prescription of contraceptive pills 11/19/2023   General counseling and advice on contraceptive management 11/19/2023   Encounter for surveillance of injectable contraceptive 06/20/2022   Encounter for well woman exam with routine gynecological exam 06/20/2022   Asthmatic bronchitis , chronic (HCC) 04/12/2022   Chest pain, musculoskeletal 04/12/2022   Not well controlled moderate persistent asthma 02/04/2022   Tobacco use 02/04/2022   Graves disease 02/08/2021   Vitamin D  insufficiency 02/07/2021   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  Discussed the use of AI scribe software for clinical note transcription with the patient and/or guardian, who gave verbal consent to proceed.  Peggy [name] is a 33 y.o. female presenting for a follow up visit.  She was last seen in February 2023 by Arlean Mutter, one of our nurse practitioners.  At that visit, she was started on Symbicort  160 mcg 2 puffs twice daily, which was increased from 80 mcg dose.  She was also started on montelukast  10 mg daily and albuterol  as  needed.  For her allergic rhinitis, she was continued on cetirizine  and started on Flonase .  Asthma/Respiratory Symptom History: ***  Allergic Rhinitis Symptom History: ***  Food Allergy Symptom History: ***  Skin Symptom History: ***  GERD Symptom History: ***  Infection Symptom History: ***  Otherwise, there have been no changes to her past medical history, surgical history, family history, or social history.    Review of systems otherwise negative other than that mentioned in the HPI.    Objective:   Blood pressure 120/72, pulse 94, temperature (!) 97.2 F (36.2 C), temperature source Temporal, resp. rate 18, height 4' 11.45 (1.51 m), weight 134 lb 3.2 oz (60.9 kg), SpO2 97%. Body mass index is 26.7 kg/m.    Physical Exam   Diagnostic studies:    Spirometry: results normal (FEV1: 2.26/84%, FVC: 3.31/105%, FEV1/FVC: 68%).    Spirometry consistent with normal pattern. {Blank single:19197::Albuterol /Atrovent  nebulizer,Xopenex /Atrovent  nebulizer,Albuterol  nebulizer,Albuterol  four puffs via MDI,Xopenex  four puffs via MDI} treatment given in clinic with {Blank single:19197::significant improvement in FEV1 per ATS criteria,significant improvement in FVC per ATS criteria,significant improvement in FEV1 and FVC per ATS criteria,improvement in FEV1, but not significant per ATS criteria,improvement in FVC, but not significant per ATS criteria,improvement in FEV1 and FVC, but not significant  per ATS criteria,no improvement}.  Allergy Studies: {Blank single:19197::none,deferred due to recent antihistamine use,deferred due to insurance stipulations that require a separate visit for testing,labs sent instead, }    {Blank single:19197::Allergy testing results were read and interpreted by myself, documented by clinical staff., }      Marty Shaggy, MD  [facility] 

## 2024-07-30 ENCOUNTER — Encounter: Payer: Self-pay | Admitting: Allergy & Immunology

## 2024-08-06 ENCOUNTER — Encounter: Payer: Self-pay | Admitting: Allergy & Immunology

## 2024-08-09 NOTE — Telephone Encounter (Signed)
 Please advise as to how many allergy injections it would be.

## 2024-10-01 ENCOUNTER — Other Ambulatory Visit (INDEPENDENT_AMBULATORY_CARE_PROVIDER_SITE_OTHER): Admitting: *Deleted

## 2024-10-01 ENCOUNTER — Other Ambulatory Visit (HOSPITAL_COMMUNITY)
Admission: RE | Admit: 2024-10-01 | Discharge: 2024-10-01 | Disposition: A | Discharge status: Home / Self Care | Source: Ambulatory Visit | Attending: Obstetrics & Gynecology | Admitting: Obstetrics & Gynecology

## 2024-10-01 ENCOUNTER — Encounter

## 2024-10-01 DIAGNOSIS — N898 Other specified noninflammatory disorders of vagina: Secondary | ICD-10-CM | POA: Diagnosis not present

## 2024-10-01 NOTE — Progress Notes (Signed)
   NURSE VISIT- VAGINITIS/STD  SUBJECTIVE:  Peggy [name] is a 33 y.o. [phone] GYN patientfemale here for a vaginal swab for vaginitis screening, STD screen.  She reports the following symptoms: vaginal discharge & irritation for 4 days. Denies abnormal vaginal bleeding, significant pelvic pain, fever, or UTI symptoms.  OBJECTIVE:  There were no vitals taken for this visit.  Appears well, in no apparent distress  ASSESSMENT: Vaginal swab for vaginitis screening & STD screening.  PLAN: Self-collected vaginal probe for Gonorrhea, Chlamydia, Trichomonas, Bacterial Vaginosis, Yeast sent to lab Treatment: to be determined once results are received Follow-up as needed if symptoms persist/worsen, or new symptoms develop  Clarita Salt  10/01/2024 12:08 PM

## 2024-10-04 ENCOUNTER — Ambulatory Visit: Payer: Self-pay | Admitting: Adult Health

## 2024-10-04 LAB — CERVICOVAGINAL ANCILLARY ONLY
Bacterial Vaginitis (gardnerella): NEGATIVE
Candida Glabrata: NEGATIVE
Candida Vaginitis: POSITIVE — AB
Chlamydia: NEGATIVE
Comment: NEGATIVE
Comment: NEGATIVE
Comment: NEGATIVE
Comment: NEGATIVE
Comment: NEGATIVE
Comment: NORMAL
Neisseria Gonorrhea: NEGATIVE
Trichomonas: NEGATIVE

## 2024-10-04 MED ORDER — FLUCONAZOLE 150 MG PO TABS: ORAL_TABLET | ORAL | 1 refills | Status: DC

## 2024-10-07 ENCOUNTER — Ambulatory Visit
Admission: EM | Admit: 2024-10-07 | Discharge: 2024-10-07 | Disposition: A | Discharge status: Home / Self Care | Attending: Nurse Practitioner | Admitting: Nurse Practitioner

## 2024-10-07 DIAGNOSIS — J069 Acute upper respiratory infection, unspecified: Secondary | ICD-10-CM | POA: Diagnosis not present

## 2024-10-07 DIAGNOSIS — J4521 Mild intermittent asthma with (acute) exacerbation: Secondary | ICD-10-CM | POA: Diagnosis not present

## 2024-10-07 DIAGNOSIS — H6993 Unspecified Eustachian tube disorder, bilateral: Secondary | ICD-10-CM

## 2024-10-07 MED ORDER — PREDNISONE 20 MG PO TABS: 40.0000 mg | ORAL_TABLET | Freq: Every day | ORAL | 0 refills | Status: AC

## 2024-10-07 MED ORDER — BENZONATATE 100 MG PO CAPS: 100.0000 mg | ORAL_CAPSULE | Freq: Three times a day (TID) | ORAL | 0 refills | Status: DC | PRN

## 2024-10-07 NOTE — ED Triage Notes (Addendum)
 Pt reports cough, congestion x 1 week.

## 2024-10-07 NOTE — ED Provider Notes (Signed)
 [facility]    CSN: [id number] Arrival date & time: 10/07/24  1225      History   Chief Complaint No chief complaint on file.   HPI Peggy [name] is a 33 y.o. female.   Patient presents today for symptoms that began approximately 1 week ago.  Reports when symptoms first began, she thought symptoms were attributed to allergies for which she takes Benadryl .  Reports for the past day, she think symptoms have worsened.  She denies fever, body aches or chills.  She is coughing quite a bit and coughing up mucus as well as she feels short of breath and is having some chest tightness.  She used her albuterol  inhaler which did seem to help a little bit.  She also endorses runny and stuffy nose, itchy throat, headache, bilateral ear popping, decreased appetite, and fatigue.  No abdominal pain, nausea, or diarrhea.  Her daughter is sick with similar symptoms.    Past Medical History:  Diagnosis Date   Abnormal Pap smear of cervix 06/22/2020   05/2020 pap LSIL negative HPV and GC/CHL  As per ASCCP guidelines repeat in 1 year, 5 year risk of CIN 3 + is 2%   Asthma    Gestational diabetes    Pyloric stenosis    Thyroid  disease    Graves   Urticaria     Patient Active Problem List   Diagnosis Date Noted   Migraine with aura and without status migrainosus, not intractable 11/19/2023   Smoker 11/19/2023   General counseling for prescription of oral contraceptives 11/19/2023   Pregnancy test negative 11/19/2023   Encounter for initial prescription of contraceptive pills 11/19/2023   General counseling and advice on contraceptive management 11/19/2023   Encounter for surveillance of injectable contraceptive 06/20/2022   Encounter for well woman exam with routine gynecological exam 06/20/2022   Asthmatic bronchitis , chronic (HCC) 04/12/2022   Chest pain, musculoskeletal 04/12/2022   Not well controlled moderate persistent asthma 02/04/2022   Tobacco use 02/04/2022   Graves  disease 02/08/2021   Vitamin D  insufficiency 02/07/2021   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   Chronic urticaria 10/12/2020   Recurrent infections 10/12/2020   Seasonal and perennial allergic rhinitis 10/12/2020   Mild persistent asthma, uncomplicated 10/12/2020   Abnormal Pap smear of cervix 06/22/2020   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/09/2020   Graves' disease 01/24/2020   Depression screening 05/25/2019   History of gestational diabetes 10/28/2018   Low vitamin D  level 02/12/2017   Current smoker 07/25/2014    Past Surgical History:  Procedure Laterality Date   PYLOROMYOTOMY      OB History     Gravida  4   Para  4   Term  4   Preterm  0   AB  0   Living  4      SAB  0   IAB  0   Ectopic  0   Multiple  0   Live Births  4            Home Medications    Prior to Admission medications   Medication Sig Start Date End Date Taking? Authorizing Provider  benzonatate  (TESSALON ) 100 MG capsule Take 1 capsule (100 mg total) by mouth 3 (three) times daily as needed for cough. Do not take with alcohol or while driving or operating heavy machinery.  May cause drowsiness. 10/07/24  Yes Chandra Harlene LABOR, NP  predniSONE  (DELTASONE ) 20 MG tablet Take 2 tablets (40 mg  total) by mouth daily for 5 days. 10/07/24 10/12/24 Yes Chandra Harlene LABOR, NP  albuterol  (PROVENTIL ) (2.5 MG/3ML) 0.083% nebulizer solution Take 3 mLs (2.5 mg total) by nebulization every 6 (six) hours as needed for wheezing or shortness of breath. 03/11/24   Leath-Warren, Etta PARAS, NP  albuterol  (VENTOLIN  HFA) 108 (90 Base) MCG/ACT inhaler Inhale 2 puffs into the lungs every 4 (four) hours as needed. 07/19/24   Stuart Vernell Norris, PA-C  famotidine  (PEPCID ) 20 MG tablet TAKE 1 TABLET BY MOUTH TWICE A DAY 04/11/22   Ambs, Arlean HERO, FNP  fluconazole (DIFLUCAN) 150 MG tablet Take 1 now and 1 in 3 days if needed 10/04/24   Signa Delon LABOR, NP  levocetirizine (XYZAL ) 5 MG tablet Take  1 tablet (5 mg total) by mouth in the morning and at bedtime. 07/28/24   Iva Marty Saltness, MD  levothyroxine  (SYNTHROID ) 150 MCG tablet Take 1 tablet (150 mcg total) by mouth daily before breakfast. 04/02/23   Idol, Mliss, PA-C  ondansetron  (ZOFRAN -ODT) 4 MG disintegrating tablet Take 1 tablet (4 mg total) by mouth every 8 (eight) hours as needed for nausea or vomiting. 03/12/23   Stuart Vernell Norris, PA-C  rizatriptan  (MAXALT -MLT) 10 MG disintegrating tablet Take 1 tablet (10 mg total) by mouth as needed for migraine. May repeat in 2 hours if needed 08/25/23   Penumalli, Eduard SAUNDERS, MD    Family History Family History  Problem Relation Age of Onset   Arthritis Mother    Cancer Mother        thyroid    Depression Mother    Hyperlipidemia Father    Asthma Father    Hypertension Father    Heart disease Father    COPD Maternal Grandfather    Diabetes Paternal Grandmother    Arthritis Paternal Grandmother    Asthma Paternal Grandmother    Heart disease Paternal Grandmother    Depression Paternal Grandmother    Hypertension Paternal Grandmother    Hyperlipidemia Paternal Grandmother    Other Neg Hx     Social History Social History   Tobacco Use   Smoking status: Every Day    Current packs/day: 0.25    Average packs/day: 0.3 packs/day for 5.0 years (1.3 ttl pk-yrs)    Types: Cigarettes   Smokeless tobacco: Never   Tobacco comments:    2 cig/day  Vaping Use   Vaping status: Never Used  Substance Use Topics   Alcohol use: Yes    Comment: rarely   Drug use: No     Allergies   Metronidazole    Review of Systems Review of Systems Per HPI  Physical Exam Triage Vital Signs ED Triage Vitals [10/07/24 1257]  Encounter Vitals Group     BP 118/82     Girls Systolic BP Percentile      Girls Diastolic BP Percentile      Boys Systolic BP Percentile      Boys Diastolic BP Percentile      Pulse Rate 100     Resp 18     Temp 99.5 F (37.5 C)     Temp Source Oral      SpO2 96 %     Weight      Height      Head Circumference      Peak Flow      Pain Score 0     Pain Loc      Pain Education      Exclude from Growth Chart  No data found.  Updated Vital Signs BP 118/82 (BP Location: Right Arm)   Pulse 100   Temp 99.5 F (37.5 C) (Oral)   Resp 18   LMP 09/13/2024 (Within Days)   SpO2 96%   Visual Acuity Right Eye Distance:   Left Eye Distance:   Bilateral Distance:    Right Eye Near:   Left Eye Near:    Bilateral Near:     Physical Exam Vitals and nursing note reviewed.  Constitutional:      General: She is not in acute distress.    Appearance: Normal appearance. She is not ill-appearing or toxic-appearing.  HENT:     Head: Normocephalic and atraumatic.     Right Ear: Ear canal and external ear normal. A middle ear effusion is present.     Left Ear: Ear canal and external ear normal. A middle ear effusion is present.     Nose: No congestion or rhinorrhea.     Mouth/Throat:     Mouth: Mucous membranes are moist.     Pharynx: Oropharynx is clear. No oropharyngeal exudate or posterior oropharyngeal erythema.  Eyes:     General: No scleral icterus.    Extraocular Movements: Extraocular movements intact.  Cardiovascular:     Rate and Rhythm: Normal rate and regular rhythm.  Pulmonary:     Effort: Pulmonary effort is normal. No respiratory distress.     Breath sounds: Wheezing (mild expiratory wheezing) present. No rhonchi or rales.  Musculoskeletal:     Cervical back: Normal range of motion and neck supple.  Lymphadenopathy:     Cervical: No cervical adenopathy.  Skin:    General: Skin is warm and dry.     Coloration: Skin is not jaundiced or pale.     Findings: No erythema or rash.  Neurological:     Mental Status: She is alert and oriented to person, place, and time.  Psychiatric:        Behavior: Behavior is cooperative.      UC Treatments / Results  Labs (all labs ordered are listed, but only abnormal results are  displayed) Labs Reviewed - No data to display  EKG   Radiology No results found.  Procedures Procedures (including critical care time)  Medications Ordered in UC Medications - No data to display  Initial Impression / Assessment and Plan / UC Course  I have reviewed the triage vital signs and the nursing notes.  Pertinent labs & imaging results that were available during my care of the patient were reviewed by me and considered in my medical decision making (see chart for details).   Patient is well-appearing, normotensive, afebrile, not tachycardic, not tachypneic, oxygenating well on room air.   1. Mild intermittent asthma with acute exacerbation 2. Viral URI with cough 3 Eustachian tube dysfunction, bilateral Symptoms are likely attributed to a viral upper respiratory infection Given length of symptoms, viral testing deferred Vitals and exam are stable today Will treat with oral prednisone  given mild wheezing-we discussed this will likely help with eustachian tube dysfunction additionally Other supportive care discussed including cough suppressant medication and Mucinex  Return and ER precautions discussed Work excuse provided  The patient was given the opportunity to ask questions.  All questions answered to their satisfaction.  The patient is in agreement to this plan.   Final Clinical Impressions(s) / UC Diagnoses   Final diagnoses:  Mild intermittent asthma with acute exacerbation  Viral URI with cough  Eustachian tube dysfunction, bilateral  Discharge Instructions      You have a viral upper respiratory infection that is causing an exacerbation of asthma.  Increase use of albuterol  until symptoms improve.  Start taking the oral prednisone  to help with airway inflammation.  Symptoms should improve over the next week to 10 days.  If you develop chest pain or shortness of breath, go to the emergency room.   Some things that can make you feel better are: -  Increased rest - Increasing fluid with water/sugar free electrolytes - Acetaminophen  and ibuprofen  as needed for fever/pain - Salt water gargling, chloraseptic spray and throat lozenges - OTC guaifenesin  (Mucinex ) 600 mg twice daily for congestion - Saline sinus flushes or a neti pot -Tessalon  Perles every 8 hours as needed for dry cough  - Humidifying the air     ED Prescriptions     Medication Sig Dispense Auth. Provider   predniSONE  (DELTASONE ) 20 MG tablet Take 2 tablets (40 mg total) by mouth daily for 5 days. 10 tablet Chandra Raisin A, NP   benzonatate  (TESSALON ) 100 MG capsule Take 1 capsule (100 mg total) by mouth 3 (three) times daily as needed for cough. Do not take with alcohol or while driving or operating heavy machinery.  May cause drowsiness. 21 capsule Chandra Raisin LABOR, NP      PDMP not reviewed this encounter.   Chandra Raisin LABOR, NP 10/07/24 1352

## 2024-10-07 NOTE — Discharge Instructions (Signed)
 You have a viral upper respiratory infection that is causing an exacerbation of asthma.  Increase use of albuterol  until symptoms improve.  Start taking the oral prednisone  to help with airway inflammation.  Symptoms should improve over the next week to 10 days.  If you develop chest pain or shortness of breath, go to the emergency room.   Some things that can make you feel better are: - Increased rest - Increasing fluid with water/sugar free electrolytes - Acetaminophen  and ibuprofen  as needed for fever/pain - Salt water gargling, chloraseptic spray and throat lozenges - OTC guaifenesin  (Mucinex ) 600 mg twice daily for congestion - Saline sinus flushes or a neti pot -Tessalon  Perles every 8 hours as needed for dry cough  - Humidifying the air

## 2024-10-27 ENCOUNTER — Ambulatory Visit: Admitting: Family Medicine

## 2024-10-27 NOTE — Patient Instructions (Incomplete)
 Asthma Continue Symbicort  2 puffs twice a day with a spacer to prevent cough or wheeze Continue albuterol  2 puffs every 4 hours as needed for cough or wheeze OR Instead use albuterol  0.083% solution via nebulizer one unit vial every 4 hours as needed for cough or wheeze  You may use albuterol  2 puffs 5 to 15 minutes before activity to decrease cough or wheeze  Allergic rhinitis Continue allergen avoidance measures directed toward grass pollen, tree pollen, indoor mold, dust mite, cat, dog, and cockroach as listed below Continue Xyzal  5 mg once or twice a day if needed for runny nose or itch Consider Flonase  2 sprays in each nostril once a day if needed for stuffy nose Consider saline nasal rinses as needed for nasal symptoms. Use this before any medicated nasal sprays for best result Consider allergen immunotherapy if your symptoms are not well-controlled with the treatment plan as listed above  Urticaria Continue Xyzal  5 mg once or twice a day if needed for hives Add famotidine  20 mg once or twice a day if Xyzal  is not effective If your symptoms re-occur, begin a journal of events that occurred for up to 6 hours before your symptoms began including foods and beverages consumed, soaps or perfumes you had contact with, and medications.   Recurrent infection Keep track of infection, antibiotic use, and steroid use Call the clinic if this treatment plan is not working well for you.  Follow up in *** or sooner if needed.  Reducing Pollen Exposure The American Academy of Allergy, Asthma and Immunology suggests the following steps to reduce your exposure to pollen during allergy seasons. Do not hang sheets or clothing out to dry; pollen may collect on these items. Do not mow lawns or spend time around freshly cut grass; mowing stirs up pollen. Keep windows closed at night.  Keep car windows closed while driving. Minimize morning activities outdoors, a time when pollen counts are usually at  their highest. Stay indoors as much as possible when pollen counts or humidity is high and on windy days when pollen tends to remain in the air longer. Use air conditioning when possible.  Many air conditioners have filters that trap the pollen spores. Use a HEPA room air filter to remove pollen form the indoor air you breathe.  Control of Mold Allergen Mold and fungi can grow on a variety of surfaces provided certain temperature and moisture conditions exist.  Outdoor molds grow on plants, decaying vegetation and soil.  The major outdoor mold, Alternaria and Cladosporium, are found in very high numbers during hot and dry conditions.  Generally, a late Summer - Fall peak is seen for common outdoor fungal spores.  Rain will temporarily lower outdoor mold spore count, but counts rise rapidly when the rainy period ends.  The most important indoor molds are Aspergillus and Penicillium.  Dark, humid and poorly ventilated basements are ideal sites for mold growth.  The next most common sites of mold growth are the bathroom and the kitchen.  Outdoor [employer] Use air conditioning and keep windows closed Avoid exposure to decaying vegetation. Avoid leaf raking. Avoid grain handling. Consider wearing a face mask if working in moldy areas.  Indoor Mold Control Maintain humidity below 50%. Clean washable surfaces with 5% bleach solution. Remove sources e.g. Contaminated carpets.   Control of Dust Mite Allergen Dust mites play a major role in allergic asthma and rhinitis. They occur in environments with high humidity wherever human skin is found. Dust mites  absorb humidity from the atmosphere (ie, they do not drink) and feed on organic matter (including shed human and animal skin). Dust mites are a microscopic type of insect that you cannot see with the naked eye. High levels of dust mites have been detected from mattresses, pillows, carpets, upholstered furniture, bed covers, clothes, soft toys and any  woven material. The principal allergen of the dust mite is found in its feces. A gram of dust may contain 1,000 mites and 250,000 fecal particles. Mite antigen is easily measured in the air during house cleaning activities. Dust mites do not bite and do not cause harm to humans, other than by triggering allergies/asthma.  Ways to decrease your exposure to dust mites in your home:  1. Encase mattresses, box springs and pillows with a mite-impermeable barrier or cover  2. Wash sheets, blankets and drapes weekly in hot water (130 F) with detergent and dry them in a dryer on the hot setting.  3. Have the room cleaned frequently with a vacuum cleaner and a damp dust-mop. For carpeting or rugs, vacuuming with a vacuum cleaner equipped with a high-efficiency particulate air (HEPA) filter. The dust mite allergic individual should not be in a room which is being cleaned and should wait 1 hour after cleaning before going into the room.  4. Do not sleep on upholstered furniture (eg, couches).  5. If possible removing carpeting, upholstered furniture and drapery from the home is ideal. Horizontal blinds should be eliminated in the rooms where the person spends the most time (bedroom, study, television room). Washable vinyl, roller-type shades are optimal.  6. Remove all non-washable stuffed toys from the bedroom. Wash stuffed toys weekly like sheets and blankets above.  7. Reduce indoor humidity to less than 50%. Inexpensive humidity monitors can be purchased at most hardware stores. Do not use a humidifier as can make the problem worse and are not recommended.  Control of Dog or Cat Allergen Avoidance is the best way to manage a dog or cat allergy. If you have a dog or cat and are allergic to dog or cats, consider removing the dog or cat from the home. If you have a dog or cat but don't want to find it a new home, or if your family wants a pet even though someone in the household is allergic, here are some  strategies that may help keep symptoms at bay:  Keep the pet out of your bedroom and restrict it to only a few rooms. Be advised that keeping the dog or cat in only one room will not limit the allergens to that room. Don't pet, hug or kiss the dog or cat; if you do, wash your hands with soap and water. High-efficiency particulate air (HEPA) cleaners run continuously in a bedroom or living room can reduce allergen levels over time. Regular use of a high-efficiency vacuum cleaner or a central vacuum can reduce allergen levels. Giving your dog or cat a bath at least once a week can reduce airborne allergen.  Control of Cockroach Allergen Cockroach allergen has been identified as an important cause of acute attacks of asthma, especially in urban settings.  There are fifty-five species of cockroach that exist in the [place] , however only three, the American, German and Oriental species produce allergen that can affect patients with Asthma.  Allergens can be obtained from fecal particles, egg casings and secretions from cockroaches.    Remove food sources. Reduce access to water. Seal access and entry points. Spray  runways with 0.5-1% Diazinon or Chlorpyrifos Blow boric acid power under stoves and refrigerator. Place bait stations (hydramethylnon) at feeding sites.

## 2024-10-27 NOTE — Progress Notes (Deleted)
   [address] [name]  [place] [address] Dept: [phone]  FOLLOW UP NOTE  Patient ID: Peggy [name], female    DOB: 1991/04/22  Age: 33 y.o. MRN: [id number] Date of Office Visit: 10/27/2024  Assessment  Chief Complaint: No chief complaint on file.  HPI Peggy [name] is a 33 year old female who presents to the clinic for follow-up visit.  She was last seen in this clinic on 07/28/2024 By Dr. Iva for evaluation of asthma, allergic rhinitis, urticaria, and recurrent infection.  Her last environmental allergies testing on 10/11/2020 was positive to grass pollen, tree pollen, indoor mold, dust mite, cat, dog, and cockroach. Discussed the use of AI scribe software for clinical note transcription with the patient, who gave verbal consent to proceed.  History of Present Illness      Drug Allergies:  Allergies  Allergen Reactions   Metronidazole  Nausea Only    Patient requests gel form for all future treatment of BV    Physical Exam: LMP 09/13/2024 (Within Days)    Physical Exam  Diagnostics:    Assessment and Plan: No diagnosis found.  No orders of the defined types were placed in this encounter.   There are no Patient Instructions on file for this visit.  No follow-ups on file.    Thank you for the opportunity to care for this patient.  Please do not hesitate to contact me with questions.  Arlean Mutter, FNP [facility]

## 2024-12-09 ENCOUNTER — Ambulatory Visit (INDEPENDENT_AMBULATORY_CARE_PROVIDER_SITE_OTHER)

## 2024-12-09 ENCOUNTER — Ambulatory Visit
Admission: RE | Admit: 2024-12-09 | Discharge: 2024-12-09 | Disposition: A | Discharge status: Home / Self Care | Source: Ambulatory Visit | Attending: Nurse Practitioner

## 2024-12-09 VITALS — BP 124/79 | HR 79 | Temp 99.3°F | Resp 18

## 2024-12-09 DIAGNOSIS — S59912A Unspecified injury of left forearm, initial encounter: Secondary | ICD-10-CM

## 2024-12-09 DIAGNOSIS — S5012XA Contusion of left forearm, initial encounter: Secondary | ICD-10-CM

## 2024-12-09 DIAGNOSIS — Z043 Encounter for examination and observation following other accident: Secondary | ICD-10-CM | POA: Diagnosis not present

## 2024-12-09 NOTE — ED Triage Notes (Signed)
 Left arm slammed in door last week.  States continues to have pain and swelling to area.

## 2024-12-09 NOTE — ED Provider Notes (Signed)
 [facility]    CSN: [id number] Arrival date & time: 12/09/24  1045      History   Chief Complaint Chief Complaint  Patient presents with   Arm Injury    Entered by patient    HPI Peggy [name] is a 33 y.o. female.   The history is provided by the patient.   Patient presents for complaints of left forearm pain.  Patient states that her left forearm was slammed in a door approximately 1 week ago.  She states she continues to experience pain, swelling, and bruising.  She states that she also has difficulty lifting items and moving her hand.  Denies numbness, tingling, or radiation of pain.  States she has been using ice and heat to the affected area.  Patient reports she is right-hand dominant.  Past Medical History:  Diagnosis Date   Abnormal Pap smear of cervix 06/22/2020   05/2020 pap LSIL negative HPV and GC/CHL  As per ASCCP guidelines repeat in 1 year, 5 year risk of CIN 3 + is 2%   Asthma    Gestational diabetes    Pyloric stenosis    Thyroid  disease    Graves   Urticaria     Patient Active Problem List   Diagnosis Date Noted   Migraine with aura and without status migrainosus, not intractable 11/19/2023   Smoker 11/19/2023   General counseling for prescription of oral contraceptives 11/19/2023   Pregnancy test negative 11/19/2023   Encounter for initial prescription of contraceptive pills 11/19/2023   General counseling and advice on contraceptive management 11/19/2023   Encounter for surveillance of injectable contraceptive 06/20/2022   Encounter for well woman exam with routine gynecological exam 06/20/2022   Asthmatic bronchitis , chronic (HCC) 04/12/2022   Chest pain, musculoskeletal 04/12/2022   Not well controlled moderate persistent asthma 02/04/2022   Tobacco use 02/04/2022   Graves disease 02/08/2021   Vitamin D  insufficiency 02/07/2021   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   Chronic urticaria 10/12/2020   Recurrent infections  10/12/2020   Seasonal and perennial allergic rhinitis 10/12/2020   Mild persistent asthma, uncomplicated 10/12/2020   Abnormal Pap smear of cervix 06/22/2020   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/09/2020   Graves' disease 01/24/2020   Depression screening 05/25/2019   History of gestational diabetes 10/28/2018   Low vitamin D  level 02/12/2017   Current smoker 07/25/2014    Past Surgical History:  Procedure Laterality Date   PYLOROMYOTOMY      OB History     Gravida  4   Para  4   Term  4   Preterm  0   AB  0   Living  4      SAB  0   IAB  0   Ectopic  0   Multiple  0   Live Births  4            Home Medications    Prior to Admission medications  Medication Sig Start Date End Date Taking? Authorizing Provider  albuterol  (PROVENTIL ) (2.5 MG/3ML) 0.083% nebulizer solution Take 3 mLs (2.5 mg total) by nebulization every 6 (six) hours as needed for wheezing or shortness of breath. 03/11/24   Leath-Warren, Etta PARAS, NP  albuterol  (VENTOLIN  HFA) 108 (90 Base) MCG/ACT inhaler Inhale 2 puffs into the lungs every 4 (four) hours as needed. 07/19/24   Stuart Vernell Norris, PA-C  famotidine  (PEPCID ) 20 MG tablet TAKE 1 TABLET BY MOUTH TWICE A DAY 04/11/22   Ambs,  Arlean HERO, FNP  levothyroxine  (SYNTHROID ) 150 MCG tablet Take 1 tablet (150 mcg total) by mouth daily before breakfast. 04/02/23   Idol, Julie, PA-C  rizatriptan  (MAXALT -MLT) 10 MG disintegrating tablet Take 1 tablet (10 mg total) by mouth as needed for migraine. May repeat in 2 hours if needed 08/25/23   Penumalli, Eduard SAUNDERS, MD    Family History Family History  Problem Relation Age of Onset   Arthritis Mother    Cancer Mother        thyroid    Depression Mother    Hyperlipidemia Father    Asthma Father    Hypertension Father    Heart disease Father    COPD Maternal Grandfather    Diabetes Paternal Grandmother    Arthritis Paternal Grandmother    Asthma Paternal Grandmother    Heart disease  Paternal Grandmother    Depression Paternal Grandmother    Hypertension Paternal Grandmother    Hyperlipidemia Paternal Grandmother    Other Neg Hx     Social History Social History[1]   Allergies   Metronidazole    Review of Systems Review of Systems Per HPI  Physical Exam Triage Vital Signs ED Triage Vitals  Encounter Vitals Group     BP 12/09/24 1127 124/79     Girls Systolic BP Percentile --      Girls Diastolic BP Percentile --      Boys Systolic BP Percentile --      Boys Diastolic BP Percentile --      Pulse Rate 12/09/24 1127 79     Resp 12/09/24 1127 18     Temp 12/09/24 1127 99.3 F (37.4 C)     Temp Source 12/09/24 1127 Oral     SpO2 12/09/24 1127 97 %     Weight --      Height --      Head Circumference --      Peak Flow --      Pain Score 12/09/24 1128 6     Pain Loc --      Pain Education --      Exclude from Growth Chart --    No data found.  Updated Vital Signs BP 124/79 (BP Location: Right Arm)   Pulse 79   Temp 99.3 F (37.4 C) (Oral)   Resp 18   LMP 12/03/2024 (Exact Date)   SpO2 97%   Visual Acuity Right Eye Distance:   Left Eye Distance:   Bilateral Distance:    Right Eye Near:   Left Eye Near:    Bilateral Near:     Physical Exam Vitals and nursing note reviewed.  Constitutional:      General: She is not in acute distress.    Appearance: Normal appearance.  HENT:     Head: Normocephalic.  Eyes:     Extraocular Movements: Extraocular movements intact.     Pupils: Pupils are equal, round, and reactive to light.  Pulmonary:     Effort: Pulmonary effort is normal.  Musculoskeletal:     Left elbow: Normal.     Left forearm: Tenderness present. No swelling, edema or deformity.     Left wrist: Normal.     Cervical back: Normal range of motion.     Comments: Bruises in multiple stages of healing noted to the distal left forearm.  There is no obvious bruising, swelling, or deformity present.  Skin:    General: Skin is warm  and dry.  Neurological:     General: No focal  deficit present.     Mental Status: She is alert and oriented to person, place, and time.  Psychiatric:        Mood and Affect: Mood normal.        Behavior: Behavior normal.      UC Treatments / Results  Labs (all labs ordered are listed, but only abnormal results are displayed) Labs Reviewed - No data to display  EKG   Radiology No results found.  Procedures Procedures (including critical care time)  Medications Ordered in UC Medications - No data to display  Initial Impression / Assessment and Plan / UC Course  I have reviewed the triage vital signs and the nursing notes.  Pertinent labs & imaging results that were available during my care of the patient were reviewed by me and considered in my medical decision making (see chart for details).  X-ray of the left forearm is negative for fracture or dislocation.  On exam, patient does not exhibit any obvious deformity or swelling.  She does have bruising in multiple stages of healing at this time.  Symptoms are consistent with a contusion to the left forearm given the mechanism of injury.  Supportive care recommendations were provided discussed with the patient to include over-the-counter analgesics and RICE therapy.  Discussed indications with patient regarding follow-up, information was provided for orthopedics.  Patient was in agreement with this plan of care and verbalizes understanding.  All questions were answered.  Patient stable for discharge.  Work note was provided.  Final Clinical Impressions(s) / UC Diagnoses   Final diagnoses:  None   Discharge Instructions   None    ED Prescriptions   None    PDMP not reviewed this encounter.     [1]  Social History Tobacco Use   Smoking status: Every Day    Current packs/day: 0.25    Average packs/day: 0.3 packs/day for 5.0 years (1.3 ttl pk-yrs)    Types: Cigarettes   Smokeless tobacco: Never   Tobacco comments:     2 cig/day  Vaping Use   Vaping status: Never Used  Substance Use Topics   Alcohol use: Yes    Comment: rarely   Drug use: No     Gilmer Etta PARAS, NP 12/09/24 1226

## 2024-12-09 NOTE — Discharge Instructions (Addendum)
 The x-ray of your left forearm was negative for fracture or dislocation. You may take over-the-counter Tylenol  or ibuprofen  as needed for pain or discomfort. RICE therapy, rest, ice, compression, and elevation.  Apply ice for 20 minutes, remove for 1 hour, repeat as needed. Recommend exercises at least 2-3 times daily while symptoms persist. If your symptoms fail to improve over the next 2 to 4 weeks, or appear to be worsening, recommend follow-up with orthopedics for further evaluation. Follow-up as needed.

## 2025-04-18 ENCOUNTER — Ambulatory Visit: Admitting: Family Medicine
